# Patient Record
Sex: Male | Born: 1935 | Race: White | Hispanic: No | Smoking: Never smoker
Health system: Southern US, Community
[De-identification: ages and names within clinical notes are randomized; demographics above are authoritative.]

## PROBLEM LIST (undated history)

## (undated) DIAGNOSIS — I4891 Unspecified atrial fibrillation: Secondary | ICD-10-CM

## (undated) DIAGNOSIS — B009 Herpesviral infection, unspecified: Secondary | ICD-10-CM

## (undated) DIAGNOSIS — G894 Chronic pain syndrome: Secondary | ICD-10-CM

## (undated) DIAGNOSIS — K2100 Gastro-esophageal reflux disease with esophagitis, without bleeding: Secondary | ICD-10-CM

## (undated) DIAGNOSIS — M25569 Pain in unspecified knee: Secondary | ICD-10-CM

## (undated) DIAGNOSIS — N189 Chronic kidney disease, unspecified: Secondary | ICD-10-CM

## (undated) DIAGNOSIS — D696 Thrombocytopenia, unspecified: Secondary | ICD-10-CM

## (undated) DIAGNOSIS — F411 Generalized anxiety disorder: Secondary | ICD-10-CM

## (undated) DIAGNOSIS — N321 Vesicointestinal fistula: Secondary | ICD-10-CM

## (undated) DIAGNOSIS — M1610 Unilateral primary osteoarthritis, unspecified hip: Secondary | ICD-10-CM

## (undated) DIAGNOSIS — G4700 Insomnia, unspecified: Secondary | ICD-10-CM

## (undated) DIAGNOSIS — E876 Hypokalemia: Secondary | ICD-10-CM

## (undated) DIAGNOSIS — E785 Hyperlipidemia, unspecified: Secondary | ICD-10-CM

## (undated) DIAGNOSIS — I509 Heart failure, unspecified: Secondary | ICD-10-CM

## (undated) DIAGNOSIS — R1310 Dysphagia, unspecified: Secondary | ICD-10-CM

## (undated) DIAGNOSIS — E119 Type 2 diabetes mellitus without complications: Secondary | ICD-10-CM

## (undated) DIAGNOSIS — I10 Essential (primary) hypertension: Secondary | ICD-10-CM

## (undated) DIAGNOSIS — F329 Major depressive disorder, single episode, unspecified: Secondary | ICD-10-CM

## (undated) DIAGNOSIS — M4800 Spinal stenosis, site unspecified: Secondary | ICD-10-CM

## (undated) DIAGNOSIS — F3289 Other specified depressive episodes: Secondary | ICD-10-CM

## (undated) DIAGNOSIS — M059 Rheumatoid arthritis with rheumatoid factor, unspecified: Secondary | ICD-10-CM

## (undated) DIAGNOSIS — G609 Hereditary and idiopathic neuropathy, unspecified: Secondary | ICD-10-CM

## (undated) DIAGNOSIS — M1990 Unspecified osteoarthritis, unspecified site: Secondary | ICD-10-CM

## (undated) DIAGNOSIS — C61 Malignant neoplasm of prostate: Secondary | ICD-10-CM

## (undated) DIAGNOSIS — IMO0001 Reserved for inherently not codable concepts without codable children: Secondary | ICD-10-CM

## (undated) DIAGNOSIS — K5900 Constipation, unspecified: Secondary | ICD-10-CM

## (undated) HISTORY — DX: Unilateral primary osteoarthritis, unspecified hip: M16.10

## (undated) HISTORY — DX: Type 2 diabetes mellitus without complications: E11.9

## (undated) HISTORY — DX: Gastro-esophageal reflux disease with esophagitis, without bleeding: K21.00

## (undated) HISTORY — DX: Essential (primary) hypertension: I10

## (undated) HISTORY — DX: Generalized anxiety disorder: F41.1

## (undated) HISTORY — DX: Vesicointestinal fistula: N32.1

## (undated) HISTORY — DX: Insomnia, unspecified: G47.00

## (undated) HISTORY — DX: Reserved for inherently not codable concepts without codable children: IMO0001

## (undated) HISTORY — DX: Unspecified atrial fibrillation: I48.91

## (undated) HISTORY — DX: Major depressive disorder, single episode, unspecified: F32.9

## (undated) HISTORY — DX: Unspecified osteoarthritis, unspecified site: M19.90

## (undated) HISTORY — DX: Constipation, unspecified: K59.00

## (undated) HISTORY — DX: Chronic pain syndrome: G89.4

## (undated) HISTORY — DX: Hyperlipidemia, unspecified: E78.5

## (undated) HISTORY — DX: Spinal stenosis, site unspecified: M48.00

## (undated) HISTORY — DX: Heart failure, unspecified: I50.9

## (undated) HISTORY — DX: Pain in unspecified knee: M25.569

## (undated) HISTORY — DX: Other specified depressive episodes: F32.89

## (undated) HISTORY — DX: Rheumatoid arthritis with rheumatoid factor, unspecified: M05.9

## (undated) HISTORY — DX: Hypokalemia: E87.6

## (undated) HISTORY — DX: Thrombocytopenia, unspecified: D69.6

## (undated) HISTORY — DX: Dysphagia, unspecified: R13.10

## (undated) HISTORY — DX: Hereditary and idiopathic neuropathy, unspecified: G60.9

## (undated) HISTORY — DX: Gastro-esophageal reflux disease with esophagitis: K21.0

## (undated) HISTORY — DX: Herpesviral infection, unspecified: B00.9

## (undated) HISTORY — DX: Chronic kidney disease, unspecified: N18.9

## (undated) HISTORY — DX: Malignant neoplasm of prostate: C61

---

## 1989-07-23 HISTORY — PX: PROSTATE SURGERY: SHX751

## 1990-07-23 HISTORY — PX: NASAL SINUS SURGERY: SHX719

## 2002-02-23 ENCOUNTER — Encounter: Payer: Self-pay | Admitting: Neurological Surgery

## 2002-02-23 ENCOUNTER — Encounter: Admission: RE | Admit: 2002-02-23 | Discharge: 2002-02-23 | Payer: Self-pay | Admitting: Neurological Surgery

## 2003-12-22 ENCOUNTER — Ambulatory Visit (HOSPITAL_COMMUNITY): Admission: RE | Admit: 2003-12-22 | Discharge: 2003-12-22 | Payer: Self-pay | Admitting: Cardiovascular Disease

## 2004-01-31 HISTORY — PX: CARDIAC CATHETERIZATION: SHX172

## 2006-01-09 ENCOUNTER — Encounter: Admission: RE | Admit: 2006-01-09 | Discharge: 2006-01-09 | Payer: Self-pay | Admitting: *Deleted

## 2006-03-20 ENCOUNTER — Ambulatory Visit: Payer: Self-pay | Admitting: Internal Medicine

## 2006-03-27 ENCOUNTER — Ambulatory Visit: Payer: Self-pay | Admitting: *Deleted

## 2006-04-12 ENCOUNTER — Ambulatory Visit: Payer: Self-pay | Admitting: Internal Medicine

## 2006-07-23 HISTORY — PX: OTHER SURGICAL HISTORY: SHX169

## 2006-08-09 HISTORY — PX: COLONOSCOPY: SHX174

## 2006-08-09 LAB — HM COLONOSCOPY

## 2009-11-09 ENCOUNTER — Ambulatory Visit: Payer: Self-pay | Admitting: Oncology

## 2009-11-22 LAB — TECHNOLOGIST REVIEW

## 2009-11-22 LAB — COMPREHENSIVE METABOLIC PANEL
ALT: 17 U/L (ref 0–53)
AST: 26 U/L (ref 0–37)
Albumin: 4.4 g/dL (ref 3.5–5.2)
Alkaline Phosphatase: 57 U/L (ref 39–117)
BUN: 19 mg/dL (ref 6–23)
CO2: 23 mEq/L (ref 19–32)
Calcium: 9.1 mg/dL (ref 8.4–10.5)
Chloride: 106 mEq/L (ref 96–112)
Creatinine, Ser: 1.05 mg/dL (ref 0.40–1.50)
Glucose, Bld: 120 mg/dL — ABNORMAL HIGH (ref 70–99)
Potassium: 4.2 mEq/L (ref 3.5–5.3)
Sodium: 141 mEq/L (ref 135–145)
Total Bilirubin: 0.7 mg/dL (ref 0.3–1.2)
Total Protein: 6.8 g/dL (ref 6.0–8.3)

## 2009-11-22 LAB — CBC WITH DIFFERENTIAL/PLATELET
BASO%: 0.2 % (ref 0.0–2.0)
Basophils Absolute: 0 10*3/uL (ref 0.0–0.1)
EOS%: 1 % (ref 0.0–7.0)
Eosinophils Absolute: 0.1 10*3/uL (ref 0.0–0.5)
HCT: 37.3 % — ABNORMAL LOW (ref 38.4–49.9)
HGB: 12.4 g/dL — ABNORMAL LOW (ref 13.0–17.1)
LYMPH%: 25.4 % (ref 14.0–49.0)
MCH: 30.5 pg (ref 27.2–33.4)
MCHC: 33.2 g/dL (ref 32.0–36.0)
MCV: 91.9 fL (ref 79.3–98.0)
MONO#: 0.3 10*3/uL (ref 0.1–0.9)
MONO%: 5.4 % (ref 0.0–14.0)
NEUT#: 3.9 10*3/uL (ref 1.5–6.5)
NEUT%: 68 % (ref 39.0–75.0)
Platelets: 100 10*3/uL — ABNORMAL LOW (ref 140–400)
RBC: 4.06 10*6/uL — ABNORMAL LOW (ref 4.20–5.82)
RDW: 13.1 % (ref 11.0–14.6)
WBC: 5.8 10*3/uL (ref 4.0–10.3)
lymph#: 1.5 10*3/uL (ref 0.9–3.3)

## 2009-11-22 LAB — CHCC SMEAR

## 2009-11-25 ENCOUNTER — Ambulatory Visit (HOSPITAL_COMMUNITY): Admission: RE | Admit: 2009-11-25 | Discharge: 2009-11-25 | Payer: Self-pay | Admitting: Oncology

## 2009-12-23 ENCOUNTER — Ambulatory Visit: Payer: Self-pay | Admitting: Oncology

## 2009-12-27 LAB — CBC WITH DIFFERENTIAL/PLATELET
BASO%: 0.2 % (ref 0.0–2.0)
Basophils Absolute: 0 10*3/uL (ref 0.0–0.1)
EOS%: 2.2 % (ref 0.0–7.0)
Eosinophils Absolute: 0.1 10*3/uL (ref 0.0–0.5)
HCT: 37.4 % — ABNORMAL LOW (ref 38.4–49.9)
HGB: 12.7 g/dL — ABNORMAL LOW (ref 13.0–17.1)
LYMPH%: 31 % (ref 14.0–49.0)
MCH: 31.3 pg (ref 27.2–33.4)
MCHC: 34.1 g/dL (ref 32.0–36.0)
MCV: 92 fL (ref 79.3–98.0)
MONO#: 0.4 10*3/uL (ref 0.1–0.9)
MONO%: 7.5 % (ref 0.0–14.0)
NEUT#: 3.2 10*3/uL (ref 1.5–6.5)
NEUT%: 59.1 % (ref 39.0–75.0)
Platelets: 72 10*3/uL — ABNORMAL LOW (ref 140–400)
RBC: 4.06 10*6/uL — ABNORMAL LOW (ref 4.20–5.82)
RDW: 12.8 % (ref 11.0–14.6)
WBC: 5.5 10*3/uL (ref 4.0–10.3)
lymph#: 1.7 10*3/uL (ref 0.9–3.3)

## 2010-01-18 ENCOUNTER — Ambulatory Visit (HOSPITAL_COMMUNITY): Admission: RE | Admit: 2010-01-18 | Discharge: 2010-01-18 | Payer: Self-pay | Admitting: Oncology

## 2010-01-18 LAB — CBC WITH DIFFERENTIAL/PLATELET
BASO%: 0.2 % (ref 0.0–2.0)
Basophils Absolute: 0 10*3/uL (ref 0.0–0.1)
EOS%: 0.1 % (ref 0.0–7.0)
Eosinophils Absolute: 0 10*3/uL (ref 0.0–0.5)
HCT: 38.5 % (ref 38.4–49.9)
HGB: 12.9 g/dL — ABNORMAL LOW (ref 13.0–17.1)
LYMPH%: 7.1 % — ABNORMAL LOW (ref 14.0–49.0)
MCH: 31.2 pg (ref 27.2–33.4)
MCHC: 33.4 g/dL (ref 32.0–36.0)
MCV: 93.5 fL (ref 79.3–98.0)
MONO#: 0.2 10*3/uL (ref 0.1–0.9)
MONO%: 1.7 % (ref 0.0–14.0)
NEUT#: 8.1 10*3/uL — ABNORMAL HIGH (ref 1.5–6.5)
NEUT%: 90.9 % — ABNORMAL HIGH (ref 39.0–75.0)
Platelets: 53 10*3/uL — ABNORMAL LOW (ref 140–400)
RBC: 4.12 10*6/uL — ABNORMAL LOW (ref 4.20–5.82)
RDW: 14.6 % (ref 11.0–14.6)
WBC: 8.9 10*3/uL (ref 4.0–10.3)
lymph#: 0.6 10*3/uL — ABNORMAL LOW (ref 0.9–3.3)

## 2010-01-18 LAB — COMPREHENSIVE METABOLIC PANEL
ALT: 24 U/L (ref 0–53)
AST: 24 U/L (ref 0–37)
Albumin: 3.4 g/dL — ABNORMAL LOW (ref 3.5–5.2)
Alkaline Phosphatase: 61 U/L (ref 39–117)
BUN: 30 mg/dL — ABNORMAL HIGH (ref 6–23)
CO2: 26 mEq/L (ref 19–32)
Calcium: 8.1 mg/dL — ABNORMAL LOW (ref 8.4–10.5)
Chloride: 104 mEq/L (ref 96–112)
Creatinine, Ser: 1.43 mg/dL (ref 0.40–1.50)
Glucose, Bld: 167 mg/dL — ABNORMAL HIGH (ref 70–99)
Potassium: 4.5 mEq/L (ref 3.5–5.3)
Sodium: 138 mEq/L (ref 135–145)
Total Bilirubin: 0.6 mg/dL (ref 0.3–1.2)
Total Protein: 5.7 g/dL — ABNORMAL LOW (ref 6.0–8.3)

## 2010-01-30 ENCOUNTER — Ambulatory Visit: Payer: Self-pay | Admitting: Oncology

## 2010-01-30 ENCOUNTER — Ambulatory Visit (HOSPITAL_COMMUNITY): Admission: RE | Admit: 2010-01-30 | Discharge: 2010-01-30 | Payer: Self-pay | Admitting: Oncology

## 2010-01-31 ENCOUNTER — Ambulatory Visit: Payer: Self-pay | Admitting: Oncology

## 2010-02-08 ENCOUNTER — Ambulatory Visit (HOSPITAL_COMMUNITY): Admission: RE | Admit: 2010-02-08 | Discharge: 2010-02-08 | Payer: Self-pay | Admitting: Oncology

## 2010-04-14 ENCOUNTER — Ambulatory Visit: Payer: Self-pay | Admitting: Oncology

## 2010-04-18 LAB — CBC WITH DIFFERENTIAL/PLATELET
BASO%: 0.5 % (ref 0.0–2.0)
Basophils Absolute: 0 10*3/uL (ref 0.0–0.1)
EOS%: 2 % (ref 0.0–7.0)
Eosinophils Absolute: 0.1 10*3/uL (ref 0.0–0.5)
HCT: 38.2 % — ABNORMAL LOW (ref 38.4–49.9)
HGB: 13.3 g/dL (ref 13.0–17.1)
LYMPH%: 32.6 % (ref 14.0–49.0)
MCH: 31.7 pg (ref 27.2–33.4)
MCHC: 34.7 g/dL (ref 32.0–36.0)
MCV: 91.4 fL (ref 79.3–98.0)
MONO#: 0.6 10*3/uL (ref 0.1–0.9)
MONO%: 8.3 % (ref 0.0–14.0)
NEUT#: 4.1 10*3/uL (ref 1.5–6.5)
NEUT%: 56.6 % (ref 39.0–75.0)
Platelets: 126 10*3/uL — ABNORMAL LOW (ref 140–400)
RBC: 4.18 10*6/uL — ABNORMAL LOW (ref 4.20–5.82)
RDW: 13.1 % (ref 11.0–14.6)
WBC: 7.3 10*3/uL (ref 4.0–10.3)
lymph#: 2.4 10*3/uL (ref 0.9–3.3)

## 2010-04-18 LAB — COMPREHENSIVE METABOLIC PANEL
ALT: 11 U/L (ref 0–53)
AST: 18 U/L (ref 0–37)
Albumin: 4.3 g/dL (ref 3.5–5.2)
Alkaline Phosphatase: 67 U/L (ref 39–117)
BUN: 17 mg/dL (ref 6–23)
CO2: 22 mEq/L (ref 19–32)
Calcium: 8.9 mg/dL (ref 8.4–10.5)
Chloride: 105 mEq/L (ref 96–112)
Creatinine, Ser: 1.19 mg/dL (ref 0.40–1.50)
Glucose, Bld: 102 mg/dL — ABNORMAL HIGH (ref 70–99)
Potassium: 4.6 mEq/L (ref 3.5–5.3)
Sodium: 139 mEq/L (ref 135–145)
Total Bilirubin: 0.7 mg/dL (ref 0.3–1.2)
Total Protein: 6.4 g/dL (ref 6.0–8.3)

## 2010-07-18 ENCOUNTER — Ambulatory Visit: Payer: Self-pay | Admitting: Oncology

## 2010-10-08 LAB — DIFFERENTIAL
Basophils Absolute: 0 10*3/uL (ref 0.0–0.1)
Basophils Relative: 0 % (ref 0–1)
Eosinophils Absolute: 0 10*3/uL (ref 0.0–0.7)
Eosinophils Relative: 0 % (ref 0–5)
Lymphocytes Relative: 12 % (ref 12–46)
Lymphs Abs: 0.9 10*3/uL (ref 0.7–4.0)
Monocytes Absolute: 0.2 10*3/uL (ref 0.1–1.0)
Monocytes Relative: 3 % (ref 3–12)
Neutro Abs: 6.3 10*3/uL (ref 1.7–7.7)
Neutrophils Relative %: 85 % — ABNORMAL HIGH (ref 43–77)

## 2010-10-08 LAB — CBC
HCT: 34.4 % — ABNORMAL LOW (ref 39.0–52.0)
Hemoglobin: 11.6 g/dL — ABNORMAL LOW (ref 13.0–17.0)
MCH: 31.7 pg (ref 26.0–34.0)
MCHC: 33.8 g/dL (ref 30.0–36.0)
MCV: 93.6 fL (ref 78.0–100.0)
Platelets: 65 10*3/uL — ABNORMAL LOW (ref 150–400)
RBC: 3.68 MIL/uL — ABNORMAL LOW (ref 4.22–5.81)
RDW: 14.8 % (ref 11.5–15.5)
WBC: 7.4 10*3/uL (ref 4.0–10.5)

## 2010-10-08 LAB — CHROMOSOME ANALYSIS, BONE MARROW

## 2010-10-08 LAB — BONE MARROW EXAM

## 2010-12-08 NOTE — Assessment & Plan Note (Signed)
HEALTHCARE                               PULMONARY OFFICE NOTE   HARVEST, STANCO                       MRN:          [id number]  DATE:04/12/2006                            DOB:          November 13, 1935    Pulmonary/allergy followup.   PROBLEM:  1. Perennial allergic rhinitis.  2. Tinnitus with hearing loss/chronic acoustic injury.  3. Musculoskeletal chest pain.  4. Dyspnea with exertion.  5. History of asbestos exposure.  6. Sleep-disordered breathing with loud snoring, witnessed apneas and      probable obstructive sleep apnea.   HISTORY:  He comes today for skin testing as planned, off of antihistamines,  and again complaining that his head is packed up.  His hearing quality is  poor.  He complains of thick postnasal drainage, which increases while  eating.  Sample Nasonex helps, and he asks for another to extend the trial a  little longer.  He is not having chest pain, wheeze or cough.  He continues  to chew tobacco.   MEDICATIONS:  1. Alprazolam at bedtime.  2. Trazodone at bedtime.  3. Hydrochlorothiazide 25 mg.  4. Aleve.  5. MS Contin.  6. Aspirin 81 mg.   ALLERGIES:  No medication allergy.   OBJECTIVE:  Weight 248 pounds, BP 112/70, pulse regular 74, room air  saturation 95% at rest.  He is again noted to be hard of hearing, but otherwise not in evident  discomfort.  His nasal airway is unremarkable, pharynx significant for long palate  without stridor.  LUNGS:  Quiet.  Heart sounds are regular without murmur.   A CT scan of his sinuses on March 27, 2006, showed no acute  abnormalities.  There was a very small mucosal retention cyst in each  maxillary sinus, mild nasal septal deviation, no air fluid levels.   SKIN TESTS:  Positive histamine, negative diluent controls.  Modest positive  intradermal reactions for grass pollens, some fall weeds and tree pollens,  house dust and dog.  They do have a dog.   IMPRESSION:  1. Most of his hearing loss is probably from acoustic injury.  2. Allergic rhinitis without CT evidence of sinusitis.  I am not convinced      this is important enough to launch allergy vaccine.  3. Original complaint of dyspnea at rest with productive cough, for which      we are going to get a pulmonary function test.  4. Suggestive body habitus with reports of loud snoring, suggestive of      sleep apnea.   PLAN:  1. Pulmonary function test.  2. We are scheduling a nocturnal polysomnogram at the [facility].  3. Sinus saline lavage on a regular basis.  4. Environmental precautions with print information given and discussed.  5. Repeat sample Nasonex once each nostril daily.  6. He will return for followup after his sleep study is completed.  Clinton D. Maple Hudson, MD, FCCP, FACP   CDY/MedQ  DD:  04/12/2006  DT:  04/16/2006  Job #:  [id number]   cc:   Lenon Curt Chilton Si, M.D.  [facility]

## 2011-08-06 DIAGNOSIS — E119 Type 2 diabetes mellitus without complications: Secondary | ICD-10-CM | POA: Diagnosis not present

## 2011-08-06 DIAGNOSIS — E785 Hyperlipidemia, unspecified: Secondary | ICD-10-CM | POA: Diagnosis not present

## 2011-08-09 DIAGNOSIS — M25569 Pain in unspecified knee: Secondary | ICD-10-CM | POA: Diagnosis not present

## 2011-08-09 DIAGNOSIS — F329 Major depressive disorder, single episode, unspecified: Secondary | ICD-10-CM | POA: Diagnosis not present

## 2011-08-09 DIAGNOSIS — G4700 Insomnia, unspecified: Secondary | ICD-10-CM | POA: Diagnosis not present

## 2011-08-09 DIAGNOSIS — M1610 Unilateral primary osteoarthritis, unspecified hip: Secondary | ICD-10-CM | POA: Diagnosis not present

## 2011-08-09 DIAGNOSIS — F3289 Other specified depressive episodes: Secondary | ICD-10-CM | POA: Diagnosis not present

## 2011-08-23 DIAGNOSIS — E119 Type 2 diabetes mellitus without complications: Secondary | ICD-10-CM | POA: Diagnosis not present

## 2011-08-23 DIAGNOSIS — H2510 Age-related nuclear cataract, unspecified eye: Secondary | ICD-10-CM | POA: Diagnosis not present

## 2011-08-23 DIAGNOSIS — H0230 Blepharochalasis unspecified eye, unspecified eyelid: Secondary | ICD-10-CM | POA: Diagnosis not present

## 2011-08-23 DIAGNOSIS — H04129 Dry eye syndrome of unspecified lacrimal gland: Secondary | ICD-10-CM | POA: Diagnosis not present

## 2011-11-14 DIAGNOSIS — E119 Type 2 diabetes mellitus without complications: Secondary | ICD-10-CM | POA: Diagnosis not present

## 2011-11-14 DIAGNOSIS — I10 Essential (primary) hypertension: Secondary | ICD-10-CM | POA: Diagnosis not present

## 2011-11-16 DIAGNOSIS — M4800 Spinal stenosis, site unspecified: Secondary | ICD-10-CM | POA: Diagnosis not present

## 2011-11-16 DIAGNOSIS — E119 Type 2 diabetes mellitus without complications: Secondary | ICD-10-CM | POA: Diagnosis not present

## 2011-11-16 DIAGNOSIS — E785 Hyperlipidemia, unspecified: Secondary | ICD-10-CM | POA: Diagnosis not present

## 2011-11-16 DIAGNOSIS — G894 Chronic pain syndrome: Secondary | ICD-10-CM | POA: Diagnosis not present

## 2011-12-27 DIAGNOSIS — M1990 Unspecified osteoarthritis, unspecified site: Secondary | ICD-10-CM | POA: Diagnosis not present

## 2011-12-27 DIAGNOSIS — M549 Dorsalgia, unspecified: Secondary | ICD-10-CM | POA: Diagnosis not present

## 2012-09-08 DIAGNOSIS — E119 Type 2 diabetes mellitus without complications: Secondary | ICD-10-CM | POA: Diagnosis not present

## 2012-09-08 DIAGNOSIS — I10 Essential (primary) hypertension: Secondary | ICD-10-CM | POA: Diagnosis not present

## 2012-09-08 DIAGNOSIS — C61 Malignant neoplasm of prostate: Secondary | ICD-10-CM | POA: Diagnosis not present

## 2012-09-08 DIAGNOSIS — E785 Hyperlipidemia, unspecified: Secondary | ICD-10-CM | POA: Diagnosis not present

## 2012-09-10 DIAGNOSIS — E785 Hyperlipidemia, unspecified: Secondary | ICD-10-CM | POA: Diagnosis not present

## 2012-09-10 DIAGNOSIS — M4800 Spinal stenosis, site unspecified: Secondary | ICD-10-CM | POA: Diagnosis not present

## 2012-09-10 DIAGNOSIS — M545 Low back pain, unspecified: Secondary | ICD-10-CM | POA: Diagnosis not present

## 2012-09-10 DIAGNOSIS — E119 Type 2 diabetes mellitus without complications: Secondary | ICD-10-CM | POA: Diagnosis not present

## 2012-09-11 ENCOUNTER — Other Ambulatory Visit: Payer: Self-pay | Admitting: Internal Medicine

## 2012-09-11 DIAGNOSIS — M545 Low back pain, unspecified: Secondary | ICD-10-CM

## 2012-09-16 ENCOUNTER — Ambulatory Visit
Admission: RE | Admit: 2012-09-16 | Discharge: 2012-09-16 | Disposition: A | Discharge status: Home / Self Care | Payer: Medicare Other | Source: Ambulatory Visit | Attending: Internal Medicine | Admitting: Internal Medicine

## 2012-09-16 DIAGNOSIS — M47817 Spondylosis without myelopathy or radiculopathy, lumbosacral region: Secondary | ICD-10-CM | POA: Diagnosis not present

## 2012-09-16 DIAGNOSIS — M4310 Spondylolisthesis, site unspecified: Secondary | ICD-10-CM | POA: Diagnosis not present

## 2012-09-16 DIAGNOSIS — M545 Low back pain, unspecified: Secondary | ICD-10-CM

## 2012-09-16 DIAGNOSIS — M5126 Other intervertebral disc displacement, lumbar region: Secondary | ICD-10-CM | POA: Diagnosis not present

## 2012-10-30 DIAGNOSIS — M25559 Pain in unspecified hip: Secondary | ICD-10-CM | POA: Diagnosis not present

## 2012-11-04 ENCOUNTER — Other Ambulatory Visit: Payer: Self-pay | Admitting: Internal Medicine

## 2012-11-04 ENCOUNTER — Other Ambulatory Visit: Payer: Self-pay | Admitting: Neurological Surgery

## 2012-11-04 DIAGNOSIS — M25551 Pain in right hip: Secondary | ICD-10-CM

## 2012-11-06 ENCOUNTER — Other Ambulatory Visit: Payer: Self-pay | Admitting: Geriatric Medicine

## 2012-11-06 MED ORDER — MORPHINE SULFATE 30 MG PO TABS: ORAL_TABLET | ORAL | Status: DC

## 2012-11-07 ENCOUNTER — Ambulatory Visit
Admission: RE | Admit: 2012-11-07 | Discharge: 2012-11-07 | Disposition: A | Discharge status: Home / Self Care | Payer: Medicare Other | Source: Ambulatory Visit | Attending: Neurological Surgery | Admitting: Neurological Surgery

## 2012-11-07 DIAGNOSIS — M25551 Pain in right hip: Secondary | ICD-10-CM

## 2012-11-07 DIAGNOSIS — M25559 Pain in unspecified hip: Secondary | ICD-10-CM | POA: Diagnosis not present

## 2012-11-07 MED ORDER — METHYLPREDNISOLONE ACETATE 40 MG/ML INJ SUSP (RADIOLOG
120.0000 mg | Freq: Once | INTRAMUSCULAR | Status: AC
  Administered 2012-11-07: 120 mg via INTRA_ARTICULAR

## 2012-11-07 MED ORDER — IOHEXOL 180 MG/ML  SOLN
1.0000 mL | Freq: Once | INTRAMUSCULAR | Status: AC | PRN
  Administered 2012-11-07: 1 mL

## 2012-11-20 DIAGNOSIS — M25559 Pain in unspecified hip: Secondary | ICD-10-CM | POA: Diagnosis not present

## 2012-11-20 DIAGNOSIS — M47812 Spondylosis without myelopathy or radiculopathy, cervical region: Secondary | ICD-10-CM | POA: Diagnosis not present

## 2012-11-21 ENCOUNTER — Other Ambulatory Visit: Payer: Self-pay | Admitting: Neurological Surgery

## 2012-11-21 DIAGNOSIS — M47812 Spondylosis without myelopathy or radiculopathy, cervical region: Secondary | ICD-10-CM

## 2012-11-27 ENCOUNTER — Ambulatory Visit
Admission: RE | Admit: 2012-11-27 | Discharge: 2012-11-27 | Disposition: A | Discharge status: Home / Self Care | Payer: Medicare Other | Source: Ambulatory Visit | Attending: Neurological Surgery | Admitting: Neurological Surgery

## 2012-11-27 DIAGNOSIS — M47812 Spondylosis without myelopathy or radiculopathy, cervical region: Secondary | ICD-10-CM

## 2012-11-27 DIAGNOSIS — M5020 Other cervical disc displacement, unspecified cervical region: Secondary | ICD-10-CM | POA: Diagnosis not present

## 2012-11-27 DIAGNOSIS — M5030 Other cervical disc degeneration, unspecified cervical region: Secondary | ICD-10-CM | POA: Diagnosis not present

## 2012-12-08 ENCOUNTER — Encounter: Payer: Self-pay | Admitting: *Deleted

## 2012-12-08 ENCOUNTER — Other Ambulatory Visit: Payer: Self-pay | Admitting: *Deleted

## 2012-12-08 MED ORDER — MORPHINE SULFATE 30 MG PO TABS: ORAL_TABLET | ORAL | Status: DC

## 2012-12-09 ENCOUNTER — Ambulatory Visit: Payer: Self-pay | Admitting: Internal Medicine

## 2012-12-10 DIAGNOSIS — M47812 Spondylosis without myelopathy or radiculopathy, cervical region: Secondary | ICD-10-CM | POA: Diagnosis not present

## 2012-12-19 ENCOUNTER — Other Ambulatory Visit: Payer: Self-pay | Admitting: Geriatric Medicine

## 2012-12-19 MED ORDER — SULFAMETHOXAZOLE-TMP DS 800-160 MG PO TABS: 1.0000 | ORAL_TABLET | Freq: Two times a day (BID) | ORAL | Status: DC

## 2013-01-08 ENCOUNTER — Other Ambulatory Visit: Payer: Self-pay | Admitting: Geriatric Medicine

## 2013-01-08 MED ORDER — MORPHINE SULFATE 30 MG PO TABS: ORAL_TABLET | ORAL | Status: DC

## 2013-02-06 ENCOUNTER — Other Ambulatory Visit: Payer: Self-pay | Admitting: Geriatric Medicine

## 2013-02-09 ENCOUNTER — Other Ambulatory Visit: Payer: Self-pay | Admitting: Geriatric Medicine

## 2013-02-09 MED ORDER — MORPHINE SULFATE 30 MG PO TABS: ORAL_TABLET | ORAL | Status: DC

## 2013-03-07 ENCOUNTER — Other Ambulatory Visit: Payer: Self-pay | Admitting: Internal Medicine

## 2013-03-09 ENCOUNTER — Other Ambulatory Visit: Payer: Self-pay | Admitting: Geriatric Medicine

## 2013-03-09 MED ORDER — ZOLPIDEM TARTRATE 5 MG PO TABS: ORAL_TABLET | ORAL | Status: DC

## 2013-03-12 ENCOUNTER — Other Ambulatory Visit: Payer: Self-pay | Admitting: Geriatric Medicine

## 2013-03-12 MED ORDER — MORPHINE SULFATE 30 MG PO TABS: ORAL_TABLET | ORAL | Status: DC

## 2013-04-13 ENCOUNTER — Other Ambulatory Visit: Payer: Self-pay | Admitting: *Deleted

## 2013-04-13 MED ORDER — MORPHINE SULFATE 30 MG PO TABS: ORAL_TABLET | ORAL | Status: DC

## 2013-05-12 ENCOUNTER — Other Ambulatory Visit: Payer: Self-pay | Admitting: *Deleted

## 2013-05-12 MED ORDER — MORPHINE SULFATE 30 MG PO TABS: ORAL_TABLET | ORAL | Status: DC

## 2013-06-10 ENCOUNTER — Other Ambulatory Visit: Payer: Self-pay | Admitting: *Deleted

## 2013-06-10 MED ORDER — MORPHINE SULFATE 30 MG PO TABS: ORAL_TABLET | ORAL | Status: DC

## 2013-07-09 ENCOUNTER — Other Ambulatory Visit: Payer: Self-pay | Admitting: *Deleted

## 2013-07-09 MED ORDER — MORPHINE SULFATE 30 MG PO TABS: ORAL_TABLET | ORAL | Status: DC

## 2013-07-29 ENCOUNTER — Telehealth: Payer: Self-pay | Admitting: *Deleted

## 2013-07-29 ENCOUNTER — Other Ambulatory Visit: Payer: Self-pay | Admitting: *Deleted

## 2013-07-29 NOTE — Telephone Encounter (Signed)
Spoke to pt's son, Ronnie Jackson, he is having trouble sleeping and is inquiring about getting an RX for Trazodone to help.   Please advise.  thanks

## 2013-07-30 ENCOUNTER — Ambulatory Visit: Payer: Self-pay | Admitting: Nurse Practitioner

## 2013-07-30 ENCOUNTER — Ambulatory Visit (INDEPENDENT_AMBULATORY_CARE_PROVIDER_SITE_OTHER): Payer: Medicare Other | Admitting: Nurse Practitioner

## 2013-07-30 ENCOUNTER — Encounter: Payer: Self-pay | Admitting: Nurse Practitioner

## 2013-07-30 VITALS — BP 150/88 | HR 93 | Temp 98.6°F | Resp 12 | Wt 224.0 lb

## 2013-07-30 DIAGNOSIS — G4700 Insomnia, unspecified: Secondary | ICD-10-CM | POA: Diagnosis not present

## 2013-07-30 DIAGNOSIS — G609 Hereditary and idiopathic neuropathy, unspecified: Secondary | ICD-10-CM

## 2013-07-30 DIAGNOSIS — I10 Essential (primary) hypertension: Secondary | ICD-10-CM | POA: Diagnosis not present

## 2013-07-30 DIAGNOSIS — E785 Hyperlipidemia, unspecified: Secondary | ICD-10-CM | POA: Insufficient documentation

## 2013-07-30 DIAGNOSIS — E1142 Type 2 diabetes mellitus with diabetic polyneuropathy: Secondary | ICD-10-CM | POA: Insufficient documentation

## 2013-07-30 DIAGNOSIS — E119 Type 2 diabetes mellitus without complications: Secondary | ICD-10-CM

## 2013-07-30 DIAGNOSIS — G894 Chronic pain syndrome: Secondary | ICD-10-CM | POA: Diagnosis not present

## 2013-07-30 MED ORDER — TRAZODONE HCL 50 MG PO TABS: 25.0000 mg | ORAL_TABLET | Freq: Every evening | ORAL | Status: DC | PRN

## 2013-07-30 MED ORDER — LISINOPRIL-HYDROCHLOROTHIAZIDE 20-12.5 MG PO TABS: ORAL_TABLET | ORAL | Status: DC

## 2013-07-30 NOTE — Patient Instructions (Addendum)
Will get blood work today but you need to come back before physical for fasting blood work   Will start trazodone at night for sleep- will re-evaluate this at next visit  Routine at night Insomnia Insomnia is frequent trouble falling and/or staying asleep. Insomnia can be a long term problem or a short term problem. Both are common. Insomnia can be a short term problem when the wakefulness is related to a certain stress or worry. Long term insomnia is often related to ongoing stress during waking hours and/or poor sleeping habits. Overtime, sleep deprivation itself can make the problem worse. Every little thing feels more severe because you are overtired and your ability to cope is decreased. CAUSES   Stress, anxiety, and depression.  Poor sleeping habits.  Distractions such as TV in the bedroom.  Naps close to bedtime.  Engaging in emotionally charged conversations before bed.  Technical reading before sleep.  Alcohol and other sedatives. They may make the problem worse. They can hurt normal sleep patterns and normal dream activity.  Stimulants such as caffeine for several hours prior to bedtime.  Pain syndromes and shortness of breath can cause insomnia.  Exercise late at night.  Changing time zones may cause sleeping problems (jet lag). It is sometimes helpful to have someone observe your sleeping patterns. They should look for periods of not breathing during the night (sleep apnea). They should also look to see how long those periods last. If you live alone or observers are uncertain, you can also be observed at a sleep clinic where your sleep patterns will be professionally monitored. Sleep apnea requires a checkup and treatment. Give your caregivers your medical history. Give your caregivers observations your family has made about your sleep.  SYMPTOMS   Not feeling rested in the morning.  Anxiety and restlessness at bedtime.  Difficulty falling and staying  asleep. TREATMENT   Your caregiver may prescribe treatment for an underlying medical disorders. Your caregiver can give advice or help if you are using alcohol or other drugs for self-medication. Treatment of underlying problems will usually eliminate insomnia problems.  Medications can be prescribed for short time use. They are generally not recommended for lengthy use.  Over-the-counter sleep medicines are not recommended for lengthy use. They can be habit forming.  You can promote easier sleeping by making lifestyle changes such as:  Using relaxation techniques that help with breathing and reduce muscle tension.  Exercising earlier in the day.  Changing your diet and the time of your last meal. No night time snacks.  Establish a regular time to go to bed.  Counseling can help with stressful problems and worry.  Soothing music and white noise may be helpful if there are background noises you cannot remove.  Stop tedious detailed work at least one hour before bedtime. HOME CARE INSTRUCTIONS   Keep a diary. Inform your caregiver about your progress. This includes any medication side effects. See your caregiver regularly. Take note of:  Times when you are asleep.  Times when you are awake during the night.  The quality of your sleep.  How you feel the next day. This information will help your caregiver care for you.  Get out of bed if you are still awake after 15 minutes. Read or do some quiet activity. Keep the lights down. Wait until you feel sleepy and go back to bed.  Keep regular sleeping and waking hours. Avoid naps.  Exercise regularly.  Avoid distractions at bedtime. Distractions include watching  television or engaging in any intense or detailed activity like attempting to balance the household checkbook.  Develop a bedtime ritual. Keep a familiar routine of bathing, brushing your teeth, climbing into bed at the same time each night, listening to soothing music.  Routines increase the success of falling to sleep faster.  Use relaxation techniques. This can be using breathing and muscle tension release routines. It can also include visualizing peaceful scenes. You can also help control troubling or intruding thoughts by keeping your mind occupied with boring or repetitive thoughts like the old concept of counting sheep. You can make it more creative like imagining planting one beautiful flower after another in your backyard garden.  During your day, work to eliminate stress. When this is not possible use some of the previous suggestions to help reduce the anxiety that accompanies stressful situations. MAKE SURE YOU:   Understand these instructions.  Will watch your condition.  Will get help right away if you are not doing well or get worse. Document Released: 07/06/2000 Document Revised: 10/01/2011 Document Reviewed: 08/06/2007 [facility] Patient Information 2014 [place].

## 2013-07-30 NOTE — Progress Notes (Signed)
Patient ID: Ronnie LAWRY, male   DOB: October 19, 1935, 78 y.o.   MRN: [id number]    Allergies  Allergen Reactions  . Indocin [Indomethacin]   . Methadone     Chief Complaint  Patient presents with  . Medication Management    Request rx for Trazadone and Alprazolam. Discuss increasing Morphine to 60 mg vs 30 mg   . Medication Refill    Renew B/P medication- 90 day supply (PRINT OFF)      HPI: Patient is a 78 y.o. male seen in the office today for medication for sleep; has been taking Ambien which helped but now he is used to it; he sits up all night and is unable to sleep; napping during the day.  Has back pain so this dictates what he does during the day and sometimes he is not able to do what he wants to do.  Uses melatonin but this does not help  Trazodone helped in the past but when he got used it   Review of Systems:  Review of Systems  Constitutional: Negative.  Negative for malaise/fatigue.  Respiratory: Negative for cough and shortness of breath.   Cardiovascular: Negative for chest pain and leg swelling.  Gastrointestinal: Negative for heartburn, abdominal pain, diarrhea and constipation.  Genitourinary: Negative for dysuria.  Musculoskeletal: Positive for back pain.  Skin: Negative.   Neurological: Positive for tingling. Negative for dizziness and weakness.  Psychiatric/Behavioral: Negative for depression. The patient has insomnia. The patient is not nervous/anxious.      Past Medical History  Diagnosis Date  . Unspecified arthropathy, pelvic region and thigh   . Herpes simplex disease   . Pain in joint, lower leg   . Intestinovesical fistula   . Thrombocytopenia, unspecified   . Anxiety state, unspecified   . Depressive disorder, not elsewhere classified   . Chronic pain syndrome   . Hypopotassemia   . Insomnia, unspecified   . Dysphagia, unspecified(787.20)   . Spinal stenosis, unspecified region other than cervical   . Type II or unspecified type diabetes  mellitus without mention of complication, not stated as uncontrolled   . Reflux esophagitis   . Other and unspecified hyperlipidemia   . Unspecified constipation   . Unspecified hereditary and idiopathic peripheral neuropathy   . Unspecified essential hypertension   . Myalgia and myositis, unspecified   . Malignant neoplasm of prostate   . Dysuria    Past Surgical History  Procedure Laterality Date  . Prostate surgery  1991  . Nasal sinus surgery  1992  . Cardiac catheterization  2005   Social History:   reports that he has been smoking.  He does not have any smokeless tobacco history on file. He reports that he does not drink alcohol or use illicit drugs.  History reviewed. No pertinent family history.  Medications: Patient's Medications  New Prescriptions   No medications on file  Previous Medications   LISINOPRIL-HYDROCHLOROTHIAZIDE (PRINZIDE,ZESTORETIC) 20-12.5 MG PER TABLET    Take one tablet once daily to control blood pressure   MELATONIN 3 MG TABS    Take one to two tablets at bedtime to help rest   MORPHINE (MSIR) 30 MG TABLET    Take one tablet twice daily as needed for pain.  Modified Medications   No medications on file  Discontinued Medications   ZOLPIDEM (AMBIEN) 5 MG TABLET    Take one to two tablets as needed at bedtime.     Physical Exam:  Filed Vitals:  07/30/13 1449  BP: 150/88  Pulse: 93  Temp: 98.6 F (37 C)  TempSrc: Oral  Resp: 12  Weight: 224 lb (101.606 kg)  SpO2: 97%    Physical Exam  Constitutional: He is well-developed, well-nourished, and in no distress.  HENT:  Head: Normocephalic and atraumatic.  Mouth/Throat: Oropharynx is clear and moist. No oropharyngeal exudate.  Eyes: Conjunctivae and EOM are normal. Pupils are equal, round, and reactive to light.  Neck: Normal range of motion. Neck supple.  Cardiovascular: Normal rate, regular rhythm and normal heart sounds.   Pulmonary/Chest: Effort normal and breath sounds normal. No  respiratory distress.  Abdominal: Soft. Bowel sounds are normal. He exhibits no distension.  Musculoskeletal: He exhibits no edema and no tenderness.  Neurological: He is alert. Gait normal.  Skin: Skin is warm and dry.      Assessment/Plan 1. Insomnia, unspecified -encouraged routine, educated on sleep pattern and not to take naps during the day -increase activity during the day -routine before bedtime - traZODone (DESYREL) 50 MG tablet; Take 0.5-1 tablets (25-50 mg total) by mouth at bedtime as needed for sleep.  Dispense: 30 tablet; Refill: 1  2. Chronic pain syndrome -conts on MSIR 30 mg BID  3. Other and unspecified hyperlipidemia - Lipid panel; before next appt; did not fast today   4. Unspecified hereditary and idiopathic peripheral neuropathy -reports worsening numbness in legs; questions if diabetes worse - CBC With differential/Platelet; Future and A1c today   5. Type II or unspecified type diabetes mellitus without mention of complication, not stated as uncontrolled -diet controlled -cont lifestyle modfications - Hemoglobin A1c  6. Essential hypertension, benign Encouraged lifestyle modifications with diet and exercise, medication compliance -blood pressure high today will cont to follow goal is sbp less than 140 - Comprehensive metabolic panel - lisinopril-hydrochlorothiazide (PRINZIDE,ZESTORETIC) 20-12.5 MG per tablet; Take one tablet once daily to control blood pressure  Dispense: 30 tablet; Refill: 1   To follow up with Dr Nyoka Cowden for EV and evaluation on medication in 4-6 weeks

## 2013-07-31 LAB — COMPREHENSIVE METABOLIC PANEL
ALT: 17 IU/L (ref 0–44)
AST: 23 IU/L (ref 0–40)
Albumin/Globulin Ratio: 1.9 (ref 1.1–2.5)
Albumin: 4.2 g/dL (ref 3.5–4.8)
Alkaline Phosphatase: 67 IU/L (ref 39–117)
BUN/Creatinine Ratio: 10 (ref 10–22)
BUN: 12 mg/dL (ref 8–27)
CO2: 24 mmol/L (ref 18–29)
Calcium: 8.7 mg/dL (ref 8.6–10.2)
Chloride: 100 mmol/L (ref 97–108)
Creatinine, Ser: 1.15 mg/dL (ref 0.76–1.27)
GFR calc Af Amer: 72 mL/min/{1.73_m2} (ref >59)
GFR calc non Af Amer: 62 mL/min/{1.73_m2} (ref >59)
Globulin, Total: 2.2 g/dL (ref 1.5–4.5)
Glucose: 114 mg/dL — ABNORMAL HIGH (ref 65–99)
Potassium: 4.1 mmol/L (ref 3.5–5.2)
Sodium: 139 mmol/L (ref 134–144)
Total Bilirubin: 0.3 mg/dL (ref 0.0–1.2)
Total Protein: 6.4 g/dL (ref 6.0–8.5)

## 2013-07-31 LAB — HEMOGLOBIN A1C
Est. average glucose Bld gHb Est-mCnc: 143 mg/dL
Hgb A1c MFr Bld: 6.6 % — ABNORMAL HIGH (ref 4.8–5.6)

## 2013-08-06 ENCOUNTER — Other Ambulatory Visit: Payer: Self-pay | Admitting: *Deleted

## 2013-08-06 MED ORDER — MORPHINE SULFATE 30 MG PO TABS: ORAL_TABLET | ORAL | Status: DC

## 2013-09-04 ENCOUNTER — Encounter: Payer: Self-pay | Admitting: *Deleted

## 2013-09-09 ENCOUNTER — Other Ambulatory Visit: Payer: Self-pay | Admitting: *Deleted

## 2013-09-09 ENCOUNTER — Other Ambulatory Visit: Payer: No Typology Code available for payment source

## 2013-09-09 DIAGNOSIS — E785 Hyperlipidemia, unspecified: Secondary | ICD-10-CM

## 2013-09-09 DIAGNOSIS — G4700 Insomnia, unspecified: Secondary | ICD-10-CM

## 2013-09-09 DIAGNOSIS — G609 Hereditary and idiopathic neuropathy, unspecified: Secondary | ICD-10-CM

## 2013-09-09 MED ORDER — MORPHINE SULFATE 30 MG PO TABS: ORAL_TABLET | ORAL | Status: DC

## 2013-09-09 MED ORDER — TRAZODONE HCL 50 MG PO TABS: 25.0000 mg | ORAL_TABLET | Freq: Every evening | ORAL | Status: DC | PRN

## 2013-09-10 LAB — CBC WITH DIFFERENTIAL
Basophils Absolute: 0 10*3/uL (ref 0.0–0.2)
Basos: 0 %
Eos: 2 %
Eosinophils Absolute: 0.1 10*3/uL (ref 0.0–0.4)
HCT: 38.7 % (ref 37.5–51.0)
Hemoglobin: 13 g/dL (ref 12.6–17.7)
Immature Grans (Abs): 0 10*3/uL (ref 0.0–0.1)
Immature Granulocytes: 0 %
Lymphocytes Absolute: 2.2 10*3/uL (ref 0.7–3.1)
Lymphs: 35 %
MCH: 30.6 pg (ref 26.6–33.0)
MCHC: 33.6 g/dL (ref 31.5–35.7)
MCV: 91 fL (ref 79–97)
Monocytes Absolute: 0.5 10*3/uL (ref 0.1–0.9)
Monocytes: 8 %
Neutrophils Absolute: 3.5 10*3/uL (ref 1.4–7.0)
Neutrophils Relative %: 55 %
Platelets: 115 10*3/uL — ABNORMAL LOW (ref 150–379)
RBC: 4.25 x10E6/uL (ref 4.14–5.80)
RDW: 14.4 % (ref 12.3–15.4)
WBC: 6.3 10*3/uL (ref 3.4–10.8)

## 2013-09-10 LAB — LIPID PANEL
Chol/HDL Ratio: 4.9 ratio units (ref 0.0–5.0)
Cholesterol, Total: 168 mg/dL (ref 100–199)
HDL: 34 mg/dL — ABNORMAL LOW (ref >39)
LDL Calculated: 100 mg/dL — ABNORMAL HIGH (ref 0–99)
Triglycerides: 168 mg/dL — ABNORMAL HIGH (ref 0–149)
VLDL Cholesterol Cal: 34 mg/dL (ref 5–40)

## 2013-09-11 ENCOUNTER — Other Ambulatory Visit: Payer: No Typology Code available for payment source

## 2013-09-15 ENCOUNTER — Encounter: Payer: Self-pay | Admitting: Internal Medicine

## 2013-09-15 ENCOUNTER — Ambulatory Visit (INDEPENDENT_AMBULATORY_CARE_PROVIDER_SITE_OTHER): Payer: Medicare Other | Admitting: Internal Medicine

## 2013-09-15 VITALS — BP 156/92 | HR 76 | Temp 98.2°F | Resp 10 | Ht 68.5 in | Wt 219.0 lb

## 2013-09-15 DIAGNOSIS — G609 Hereditary and idiopathic neuropathy, unspecified: Secondary | ICD-10-CM

## 2013-09-15 DIAGNOSIS — E119 Type 2 diabetes mellitus without complications: Secondary | ICD-10-CM | POA: Diagnosis not present

## 2013-09-15 DIAGNOSIS — M069 Rheumatoid arthritis, unspecified: Secondary | ICD-10-CM

## 2013-09-15 DIAGNOSIS — M1990 Unspecified osteoarthritis, unspecified site: Secondary | ICD-10-CM

## 2013-09-15 DIAGNOSIS — M25559 Pain in unspecified hip: Secondary | ICD-10-CM

## 2013-09-15 DIAGNOSIS — E1149 Type 2 diabetes mellitus with other diabetic neurological complication: Secondary | ICD-10-CM

## 2013-09-15 DIAGNOSIS — E1142 Type 2 diabetes mellitus with diabetic polyneuropathy: Secondary | ICD-10-CM

## 2013-09-15 DIAGNOSIS — M25569 Pain in unspecified knee: Secondary | ICD-10-CM

## 2013-09-15 DIAGNOSIS — E785 Hyperlipidemia, unspecified: Secondary | ICD-10-CM

## 2013-09-15 DIAGNOSIS — C61 Malignant neoplasm of prostate: Secondary | ICD-10-CM

## 2013-09-15 DIAGNOSIS — E669 Obesity, unspecified: Secondary | ICD-10-CM | POA: Insufficient documentation

## 2013-09-15 DIAGNOSIS — M25519 Pain in unspecified shoulder: Secondary | ICD-10-CM | POA: Insufficient documentation

## 2013-09-15 DIAGNOSIS — G894 Chronic pain syndrome: Secondary | ICD-10-CM

## 2013-09-15 DIAGNOSIS — M059 Rheumatoid arthritis with rheumatoid factor, unspecified: Secondary | ICD-10-CM | POA: Insufficient documentation

## 2013-09-15 DIAGNOSIS — E1140 Type 2 diabetes mellitus with diabetic neuropathy, unspecified: Secondary | ICD-10-CM

## 2013-09-15 DIAGNOSIS — G4700 Insomnia, unspecified: Secondary | ICD-10-CM

## 2013-09-15 DIAGNOSIS — D696 Thrombocytopenia, unspecified: Secondary | ICD-10-CM

## 2013-09-15 MED ORDER — TRAZODONE HCL 150 MG PO TABS: ORAL_TABLET | ORAL | Status: DC

## 2013-09-15 MED ORDER — MORPHINE SULFATE ER 60 MG PO CP24: ORAL_CAPSULE | ORAL | Status: DC

## 2013-09-15 NOTE — Progress Notes (Signed)
Patient ID: Ronnie Jackson., male   DOB: [birth year], 78 y.o.   MRN: [id number]    Location:    [facility]  Place of Service:  OFFICE  PCP: Estill Dooms, MD  Code Status: full  Allergies  Allergen Reactions  . Indocin [Indomethacin]   . Methadone     Chief Complaint  Patient presents with  . Annual Exam    Yearly check-up  . Back Pain    Back pain worse x 1 year, pain is very extreme at times and radiates into pelvic area   . Leg Pain    Bilateral leg pain/throbbing x 2 weeks   . Medication Management    Discuss Trazodone and Morphine, not helping, discuss increasing   . Screening Test    Depression screening test completed     HPI:    Chronic pain syndrome: Pain in the lower back, right groin, and left knee. Had injection in the right knee about 2012 which helped. Left knee stays swollen. New pains in the the left shoulder anteriorly. Most of this patient's pains have been felt related to osteoarthritis and degenerative joint problems. He does have a positive rheumatoid arthritis test.  Type II or unspecified type diabetes mellitus without mention of complication, not stated as uncontrolled: Controlled  Unspecified hereditary and idiopathic peripheral neuropathy: Lack of vibratory sensation in feet. Feelings.  Other and unspecified hyperlipidemia: Controlled  Pain in joint, shoulder region: New. Anteriorly. Old rupture of the long head of the biceps.  Pain, knee: Chronic. Makes walking difficult. Worse on the left side.  Pain in joint, pelvic region and thigh : Pain in the right groin is getting worse. This impairs walking. He is ready to see an orthopedist.  Insomnia, unspecified - : Most recent prescription for trazodone does not help him sleep. Higher dosages have been helpful in the past  Obesity (BMI 30-39.9)Chronic problem. Poor dietary compliance.  Thrombocytopenia: Chronic. Recent lab shows a low platelet count, but it is improved over  previous.     Past Medical History  Diagnosis Date  . Unspecified arthropathy, pelvic region and thigh   . Herpes simplex disease   . Pain in joint, lower leg     Bilateral knee pains  . Intestinovesical fistula   . Thrombocytopenia, unspecified   . Anxiety state, unspecified   . Depressive disorder, not elsewhere classified   . Chronic pain syndrome   . Hypopotassemia   . Insomnia, unspecified   . Dysphagia, unspecified(787.20)   . Spinal stenosis, unspecified region other than cervical   . Type II or unspecified type diabetes mellitus without mention of complication, not stated as uncontrolled   . Reflux esophagitis   . Hyperlipidemia   . Constipation   . Unspecified hereditary and idiopathic peripheral neuropathy   . Hypertension   . Myalgia and myositis, unspecified   . Malignant neoplasm of prostate   . Rheumatoid arthritis with rheumatoid factor   . Osteoarthritis     Past Surgical History  Procedure Laterality Date  . Prostate surgery  1991  . Nasal sinus surgery  1992  . Cardiac catheterization  01/31/2004    Dr Gwenlyn Found  . Colonoscopy  08/09/2006    Dr Christian Mate, hemorrhoids/rectal fistula  . Cystocopy  07/2006    Dr Harlow Asa    CONSULTANTS ENT: There is a Cardiology: [facility] Pulmonary: CD Young Neurosurgery: [facility] GI: Christian Mate Urology: Puchinsky Rheumatology: Bo Merino  PAST PROCEDURES 06/14/03 CT head: Small left maxillary sinus retention cyst  or polyp 12/2003 2-D echo: Mild concentric left ventricular hypertrophy 12/2003 Cardiolite stress test: Lateral ischemia towards the apex of the heart 05/28/05 CT paranasal sinuses 05/28/05 CT thoracic spine: Extensive degenerative arthritic changes 08/09/06 colonoscopy(Dr. 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 Puchinsky)  11/25/09 ultrasound abdomen: 1.  Fatty infiltration of the liver.  No ductal dilatation.   The liver is not optimally visualized. 2.  No gallstones. 3.  Much of the anatomy is obscured by bowel gas and patient body Habitus.  02/08/10 MR lower extremities:1.  Moderately severe asymmetric right hip joint degenerative changes may account for the patient's right hip pain. 2.  No MR findings for bony metastatic disease. 3.  No hip fracture or AVN. 4.  No significant intrapelvic abnormalities are demonstrated.  09/17/12 MR lumbar spine with contrast: 7 mm marrow space focus within the L2 vertebral body is unchanged and not likely of clinical significance.   L4-5:  Facet degeneration and ligamentous hypertrophy.  1-2 mm of anterolisthesis.  Bulging of the disc.  Mild narrowing of the lateral recesses without gross neural compression.   L5-S1:  Chronic bilateral pars interarticularis defects.  2 mm of anterolisthesis.  Bulging of the disc.  No apparent neural compression.  There is not any appreciable edema associated with the pars defects and therefore they may not be significant. However, they could possibly relate to back pain.  11/21/12 MR cervical spine with contrast:1.  Degenerative cervical spondylosis with disc disease and facet disease. 2.  Shallow central disc protrusions at C2-3, C3-4 and C4-5. 3.  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  No spinal or foraminal stenosis. 4.  Advanced degenerative disc disease at C6-7 with mild spinal and right greater than left foraminal encroachment.    Social History: History   Social History  . Marital Status: Married    Spouse Name: N/A    Number of Children: N/A  . Years of Education: N/A   Social History Main Topics  . Smoking status: Current Some Day Smoker  . Smokeless tobacco: None     Comment: Chews tobacco daily   . Alcohol Use: No  . Drug Use: No  . Sexual Activity: None   Other Topics Concern  . None   Social History Narrative  . None    Family History Family Status  Relation Status Death Age  . Father Deceased   .  Mother Deceased   . Brother Alive   . Sister Alive   . Sister Alive   . Daughter Deceased   . Son Alive   . Daughter Alive    Family History  Problem Relation Age of Onset  . Prostate cancer Father   . Heart failure Mother   . Arthritis Sister   . Lung cancer Daughter      Medications: Patient's Medications  New Prescriptions   No medications on file  Previous Medications   LISINOPRIL-HYDROCHLOROTHIAZIDE (PRINZIDE,ZESTORETIC) 20-12.5 MG PER TABLET    Take one tablet once daily to control blood pressure   MELATONIN 3 MG TABS    Take one to two tablets at bedtime to help rest   MORPHINE (MSIR) 30 MG TABLET    Take one tablet twice daily as needed for pain.   TRAZODONE (DESYREL) 50 MG TABLET    Take 0.5-1 tablets (25-50 mg total) by mouth at bedtime as needed for sleep.  Modified Medications   No medications on file  Discontinued Medications   No medications on file  There is no immunization history on file for this patient.   Review of Systems  Constitutional: Positive for fatigue. Negative for fever, activity change and appetite change.  HENT: Positive for hearing loss. Negative for facial swelling, rhinorrhea, sneezing and sore throat.   Eyes: Negative.   Respiratory: Negative for apnea, cough, choking, chest tightness and shortness of breath.   Cardiovascular: Negative for chest pain, palpitations and leg swelling.  Gastrointestinal: Positive for constipation. Negative for nausea, abdominal pain, diarrhea, blood in stool and abdominal distention.  Endocrine: Negative for cold intolerance, heat intolerance, polydipsia, polyphagia and polyuria.       Diabetes mellitus  Genitourinary: Positive for urgency and frequency. Negative for discharge, scrotal swelling, penile pain and testicular pain.  Musculoskeletal: Positive for arthralgias, back pain, myalgias, neck pain and neck stiffness.  Skin: Negative.   Allergic/Immunologic: Negative.   Neurological: Positive for  numbness. Negative for dizziness, tremors, seizures, syncope, facial asymmetry, speech difficulty and headaches.  Hematological: Negative for adenopathy. Does not bruise/bleed easily.       Chronic thrombocytopenia.  Psychiatric/Behavioral: Positive for sleep disturbance and dysphoric mood. The patient is nervous/anxious.       Filed Vitals:   09/15/13 1430  BP: 156/92  Pulse: 76  Temp: 98.2 F (36.8 C)  TempSrc: Oral  Resp: 10  Height: 5' 8.5" (1.74 m)  Weight: 219 lb (99.338 kg)  SpO2: 97%   Physical Exam     Labs reviewed: Appointment on 09/09/2013  Component Date Value Ref Range Status  . WBC 09/09/2013 6.3  3.4 - 10.8 x10E3/uL Final  . RBC 09/09/2013 4.25  4.14 - 5.80 x10E6/uL Final  . Hemoglobin 09/09/2013 13.0  12.6 - 17.7 g/dL Final  . HCT 09/09/2013 38.7  37.5 - 51.0 % Final  . MCV 09/09/2013 91  79 - 97 fL Final  . MCH 09/09/2013 30.6  26.6 - 33.0 pg Final  . MCHC 09/09/2013 33.6  31.5 - 35.7 g/dL Final  . RDW 09/09/2013 14.4  12.3 - 15.4 % Final  . Platelets 09/09/2013 115* 150 - 379 x10E3/uL Final  . Neutrophils Relative % 09/09/2013 55   Final  . Lymphs 09/09/2013 35   Final  . Monocytes 09/09/2013 8   Final  . Eos 09/09/2013 2   Final  . Basos 09/09/2013 0   Final  . Neutrophils Absolute 09/09/2013 3.5  1.4 - 7.0 x10E3/uL Final  . Lymphocytes Absolute 09/09/2013 2.2  0.7 - 3.1 x10E3/uL Final  . Monocytes Absolute 09/09/2013 0.5  0.1 - 0.9 x10E3/uL Final  . Eosinophils Absolute 09/09/2013 0.1  0.0 - 0.4 x10E3/uL Final  . Basophils Absolute 09/09/2013 0.0  0.0 - 0.2 x10E3/uL Final  . Immature Granulocytes 09/09/2013 0   Final  . Immature Grans (Abs) 09/09/2013 0.0  0.0 - 0.1 x10E3/uL Final  . Cholesterol, Total 09/09/2013 168  100 - 199 mg/dL Final  . Triglycerides 09/09/2013 168* 0 - 149 mg/dL Final  . HDL 09/09/2013 34* >39 mg/dL Final   Comment: According to ATP-III Guidelines, HDL-C >59 mg/dL is considered a                          negative risk  factor for CHD.  Marland Kitchen VLDL Cholesterol Cal 09/09/2013 34  5 - 40 mg/dL Final  . LDL Calculated 09/09/2013 100* 0 - 99 mg/dL Final  . Chol/HDL Ratio 09/09/2013 4.9  0.0 - 5.0 ratio units Final   Comment:  T. Chol/HDL Ratio                                                                      Men  Women                                                        1/2 Avg.Risk  3.4    3.3                                                            Avg.Risk  5.0    4.4                                                         2X Avg.Risk  9.6    7.1                                                         3X Avg.Risk 23.4   11.0  Abstract on 09/04/2013  Component Date Value Ref Range Status  . HM Colonoscopy 08/09/2006 Dr Rodenberg-Hemorrhoids repeat ? 5 yrs   Final  Office Visit on 07/30/2013  Component Date Value Ref Range Status  . Glucose 07/30/2013 114* 65 - 99 mg/dL Final  . BUN 07/30/2013 12  8 - 27 mg/dL Final  . Creatinine, Ser 07/30/2013 1.15  0.76 - 1.27 mg/dL Final  . GFR calc non Af Amer 07/30/2013 62  >59 mL/min/1.73 Final  . GFR calc Af Amer 07/30/2013 72  >59 mL/min/1.73 Final  . BUN/Creatinine Ratio 07/30/2013 10  10 - 22 Final  . Sodium 07/30/2013 139  134 - 144 mmol/L Final  . Potassium 07/30/2013 4.1  3.5 - 5.2 mmol/L Final  . Chloride 07/30/2013 100  97 - 108 mmol/L Final  . CO2 07/30/2013 24  18 - 29 mmol/L Final  . Calcium 07/30/2013 8.7  8.6 - 10.2 mg/dL Final   Comment: **Effective August 10, 2013 the reference interval**                            for Calcium, Serum will be changing to:                                       Age                Male          Male  0 - 10 days        8.6 - 10.4     8.6 - 10.4                              11 days -  1 year        9.2 - 11.0     9.2 - 11.0                                    2 - 11 years       9.1 - 10.5     9.1 - 10.5                                   12 - 17  years       8.9 - 10.4     8.9 - 10.4                                   18 - 59 years       8.7 - 10.2     8.7 - 10.2                                       >59 years       8.6 - 10.2     8.7 - 10.3  . Total Protein 07/30/2013 6.4  6.0 - 8.5 g/dL Final  . Albumin 07/30/2013 4.2  3.5 - 4.8 g/dL Final  . Globulin, Total 07/30/2013 2.2  1.5 - 4.5 g/dL Final  . Albumin/Globulin Ratio 07/30/2013 1.9  1.1 - 2.5 Final  . Total Bilirubin 07/30/2013 0.3  0.0 - 1.2 mg/dL Final  . Alkaline Phosphatase 07/30/2013 67  39 - 117 IU/L Final  . AST 07/30/2013 23  0 - 40 IU/L Final  . ALT 07/30/2013 17  0 - 44 IU/L Final  . Hemoglobin A1C 07/30/2013 6.6* 4.8 - 5.6 % Final   Comment:          Increased risk for diabetes: 5.7 - 6.4                                   Diabetes: >6.4                                   Glycemic control for adults with diabetes: <7.0  . Estimated average glucose 07/30/2013 143   Final     Assessment/Plan  Chronic pain syndrome: Increased morphine sulfate to 60 mg twice daily  Type II or unspecified type diabetes mellitus without mention of complication, not stated as uncontrolled : Controlled  - Plan: Hemoglobin O5D, Basic metabolic panel, Microalbumin / creatinine urine ratio  Unspecified hereditary and idiopathic peripheral neuropathy: Persistent numbness in the feet most likely related to his diabetes.  Other and unspecified hyperlipidemia: Controlled   - Plan: Lipid panel  Pain in joint, shoulder region  Injected with 1/2 cc Kenalog-40 and 1 cc 1% lidocaine. Immediate relief.  Pain, knee  Injected left knee with 1 cc Kenalog-40 and 2 cc 1% lidocaine. Immediate relief  Pain in joint, pelvic region and thigh: Most likely significant degenerative joint disease of the right hip. Patient was advised this may require surgery to relieve.   - Plan: Ambulatory referral to Orthopedic Surgery  Insomnia, unspecified - Plan: traZODone (DESYREL) 150 MG tablet  Obesity (BMI  30-39.9): Advised dietary compliance and weight loss  Thrombocytopenia, unspecified: Continue to monitor  Osteoarthritis: Degenerative joint disease seems to be the main problem with his chronic pain. Multiple joints are involved.  Rheumatoid arthritis with rheumatoid factor: Abnormal lab test. Sedimentation rate has not been particularly high in the past I am uncertain whether this test is sufficient to indicate a true rheumatoid arthritis. Continue to monitor.  Malignant neoplasm of prostate: no relapse in over 20 years. No longer followed by urology. Periodic check of PSA is warranted.

## 2013-09-15 NOTE — Patient Instructions (Signed)
Continue current medication.

## 2013-09-24 DIAGNOSIS — M25559 Pain in unspecified hip: Secondary | ICD-10-CM | POA: Diagnosis not present

## 2013-09-24 DIAGNOSIS — M1710 Unilateral primary osteoarthritis, unspecified knee: Secondary | ICD-10-CM | POA: Diagnosis not present

## 2013-10-05 ENCOUNTER — Other Ambulatory Visit: Payer: Self-pay | Admitting: *Deleted

## 2013-10-05 DIAGNOSIS — G4700 Insomnia, unspecified: Secondary | ICD-10-CM

## 2013-10-05 MED ORDER — TRAZODONE HCL 150 MG PO TABS: ORAL_TABLET | ORAL | Status: DC

## 2013-10-05 NOTE — Telephone Encounter (Signed)
[facility] [address] 

## 2013-10-12 ENCOUNTER — Other Ambulatory Visit: Payer: Self-pay | Admitting: *Deleted

## 2013-10-12 DIAGNOSIS — M25559 Pain in unspecified hip: Secondary | ICD-10-CM | POA: Diagnosis not present

## 2013-10-12 MED ORDER — MORPHINE SULFATE 30 MG PO TABS: ORAL_TABLET | ORAL | Status: DC

## 2013-10-12 NOTE — Telephone Encounter (Signed)
Patient came in and handed a note to front desk to give to me and it stated:  Rodena Piety, I am not sure if this is very clear but Mr. Kumari said that the last time he was here Dr. Nyoka Cowden upped his Rx from 30 to 20 and changed it to capsules and he thinks that by him changing the RX to capsules it increased the amount of his prescription. He would also like to get a 90 supply just like his son he said. He said previously his prescription cost him $3.00 and now they are asking almost $300.00 for them. If you have any questions you can give them a call. (He was upset and asking for Dr. Nyoka Cowden, saying he has been out of his prescription since Friday. I hope this makes sense I tried to make it a little clearer then they put on the form.  I called the pharmacy to confirm what was going on and they stated that the Morphine 60 capsules are newer and only comes in a brand pill and this is where the price is probably coming from. Suggested to change it back to the 30mg  tablets and just increase the way he takes it. New Rx Printed and wife Notified and agreed.  New Rx:    Morphine 30mg  Two tablets by mouth every 12 hours as needed for pain. Dr. Mariea Clonts signed

## 2013-11-05 ENCOUNTER — Other Ambulatory Visit: Payer: Self-pay | Admitting: *Deleted

## 2013-11-05 MED ORDER — SULFAMETHOXAZOLE-TMP DS 800-160 MG PO TABS: ORAL_TABLET | ORAL | Status: DC

## 2013-11-05 NOTE — Telephone Encounter (Signed)
Patient requested and patient does take as needed for the fistula

## 2013-11-16 ENCOUNTER — Other Ambulatory Visit: Payer: Self-pay | Admitting: *Deleted

## 2013-11-16 MED ORDER — MORPHINE SULFATE 30 MG PO TABS: ORAL_TABLET | ORAL | Status: DC

## 2014-01-01 ENCOUNTER — Other Ambulatory Visit: Payer: Self-pay

## 2014-01-01 MED ORDER — MORPHINE SULFATE 30 MG PO TABS: ORAL_TABLET | ORAL | Status: DC

## 2014-01-01 NOTE — Telephone Encounter (Signed)
RX placed on ledge for signature then to the front for pick-up

## 2014-02-05 ENCOUNTER — Other Ambulatory Visit: Payer: Self-pay

## 2014-02-05 ENCOUNTER — Other Ambulatory Visit: Payer: Self-pay | Admitting: *Deleted

## 2014-02-05 MED ORDER — MORPHINE SULFATE 30 MG PO TABS
ORAL_TABLET | ORAL | Status: DC
Start: 2014-02-05 — End: 2014-02-05

## 2014-02-05 MED ORDER — MORPHINE SULFATE 30 MG PO TABS: ORAL_TABLET | ORAL | Status: DC

## 2014-02-22 ENCOUNTER — Other Ambulatory Visit: Payer: Medicare Other

## 2014-02-22 DIAGNOSIS — E119 Type 2 diabetes mellitus without complications: Secondary | ICD-10-CM

## 2014-02-22 DIAGNOSIS — E785 Hyperlipidemia, unspecified: Secondary | ICD-10-CM

## 2014-02-23 LAB — BASIC METABOLIC PANEL
BUN/Creatinine Ratio: 14 (ref 10–22)
BUN: 13 mg/dL (ref 8–27)
CO2: 23 mmol/L (ref 18–29)
Calcium: 8.6 mg/dL (ref 8.6–10.2)
Chloride: 104 mmol/L (ref 97–108)
Creatinine, Ser: 0.94 mg/dL (ref 0.76–1.27)
GFR calc Af Amer: 91 mL/min/{1.73_m2} (ref >59)
GFR calc non Af Amer: 78 mL/min/{1.73_m2} (ref >59)
Glucose: 99 mg/dL (ref 65–99)
Potassium: 4.3 mmol/L (ref 3.5–5.2)
Sodium: 143 mmol/L (ref 134–144)

## 2014-02-23 LAB — MICROALBUMIN / CREATININE URINE RATIO
Creatinine, Ur: 128.8 mg/dL (ref 22.0–328.0)
MICROALB/CREAT RATIO: 13.1 mg/g creat (ref 0.0–30.0)
Microalbumin, Urine: 16.9 ug/mL (ref 0.0–17.0)

## 2014-02-23 LAB — LIPID PANEL
Chol/HDL Ratio: 4.3 ratio units (ref 0.0–5.0)
Cholesterol, Total: 165 mg/dL (ref 100–199)
HDL: 38 mg/dL — ABNORMAL LOW (ref >39)
LDL Calculated: 91 mg/dL (ref 0–99)
Triglycerides: 181 mg/dL — ABNORMAL HIGH (ref 0–149)
VLDL Cholesterol Cal: 36 mg/dL (ref 5–40)

## 2014-02-23 LAB — HEMOGLOBIN A1C
Est. average glucose Bld gHb Est-mCnc: 128 mg/dL
Hgb A1c MFr Bld: 6.1 % — ABNORMAL HIGH (ref 4.8–5.6)

## 2014-02-24 ENCOUNTER — Ambulatory Visit (INDEPENDENT_AMBULATORY_CARE_PROVIDER_SITE_OTHER): Payer: Medicare Other | Admitting: Internal Medicine

## 2014-02-24 ENCOUNTER — Encounter: Payer: Self-pay | Admitting: Internal Medicine

## 2014-02-24 VITALS — BP 140/82 | HR 69 | Temp 98.1°F | Ht 68.5 in | Wt 215.0 lb

## 2014-02-24 DIAGNOSIS — E669 Obesity, unspecified: Secondary | ICD-10-CM

## 2014-02-24 DIAGNOSIS — G894 Chronic pain syndrome: Secondary | ICD-10-CM

## 2014-02-24 DIAGNOSIS — E1149 Type 2 diabetes mellitus with other diabetic neurological complication: Secondary | ICD-10-CM

## 2014-02-24 DIAGNOSIS — E1142 Type 2 diabetes mellitus with diabetic polyneuropathy: Secondary | ICD-10-CM | POA: Diagnosis not present

## 2014-02-24 DIAGNOSIS — R1013 Epigastric pain: Secondary | ICD-10-CM

## 2014-02-24 DIAGNOSIS — K3189 Other diseases of stomach and duodenum: Secondary | ICD-10-CM | POA: Diagnosis not present

## 2014-02-24 DIAGNOSIS — D696 Thrombocytopenia, unspecified: Secondary | ICD-10-CM | POA: Diagnosis not present

## 2014-02-24 DIAGNOSIS — K30 Functional dyspepsia: Secondary | ICD-10-CM

## 2014-02-24 MED ORDER — PREGABALIN 75 MG PO CAPS: ORAL_CAPSULE | ORAL | Status: DC

## 2014-02-24 NOTE — Progress Notes (Signed)
Patient ID: Ronnie Galea., male   DOB: 05/29/1936, 78 y.o.   MRN: [id number]    Location:    [facility]  Place of Service:  OFFICE    Allergies  Allergen Reactions  . Indocin [Indomethacin]   . Methadone     Chief Complaint  Patient presents with  . Medical Management of Chronic Issues    6 month folllow-up, discuss labs (copy printed)   . Pain    Generalized body pain- legs,knee, shoulders and back     HPI:  DM type 2 with diabetic peripheral neuropathy -controlled   Thrombocytopenia, unspecified: Lab 09/09/13 showed platelet count 115,000  Obesity (BMI 30-39.9): Chronic. Continues to be noncompliant with diet and fails to lose weight.  Diabetic polyneuropathy associated with type 2 diabetes mellitus - continues with pain in the feet as well as abdomen burning sensations   Chronic pain syndrome: Continues with terrible arthritis and pain in the back, shoulders, hips, and knees related to this. Diabetic polyneuropathy also contributes to his chronic pains.  Indigestion: Occasional indigestion and heartburn    Medications: Patient's Medications  New Prescriptions   No medications on file  Previous Medications   LISINOPRIL-HYDROCHLOROTHIAZIDE (PRINZIDE,ZESTORETIC) 20-12.5 MG PER TABLET    Take one tablet once daily to control blood pressure   MELATONIN 3 MG TABS    Take one to two tablets at bedtime to help rest   MORPHINE (MSIR) 30 MG TABLET    Take two tablets by mouth every 12 hours as needed for pain   SULFAMETHOXAZOLE-TRIMETHOPRIM (BACTRIM DS) 800-160 MG PER TABLET    Take one tablet by mouth twice daily as needed for intestinovesical fistula   TRAZODONE (DESYREL) 150 MG TABLET    Take one tablet by mouth at bedtime for rest  Modified Medications   No medications on file  Discontinued Medications   No medications on file     Review of Systems  Constitutional: Positive for fatigue. Negative for fever, activity change and appetite change.  HENT: Positive for  hearing loss. Negative for facial swelling, rhinorrhea, sneezing and sore throat.   Eyes: Negative.   Respiratory: Negative for apnea, cough, choking, chest tightness and shortness of breath.   Cardiovascular: Negative for chest pain, palpitations and leg swelling.  Gastrointestinal: Positive for constipation. Negative for nausea, abdominal pain, diarrhea, blood in stool and abdominal distention.  Endocrine: Negative for cold intolerance, heat intolerance, polydipsia, polyphagia and polyuria.       Diabetes mellitus  Genitourinary: Positive for urgency and frequency. Negative for discharge, scrotal swelling, penile pain and testicular pain.  Musculoskeletal: Positive for arthralgias, back pain, myalgias, neck pain and neck stiffness.  Skin: Negative.   Allergic/Immunologic: Negative.   Neurological: Positive for numbness. Negative for dizziness, tremors, seizures, syncope, facial asymmetry, speech difficulty and headaches.  Hematological: Negative for adenopathy. Does not bruise/bleed easily.       Chronic thrombocytopenia.  Psychiatric/Behavioral: Positive for sleep disturbance and dysphoric mood. The patient is nervous/anxious.     Filed Vitals:   02/24/14 1457  BP: 140/82  Pulse: 69  Temp: 98.1 F (36.7 C)  TempSrc: Oral  Height: 5' 8.5" (1.74 m)  Weight: 215 lb (97.523 kg)  SpO2: 98%   Body mass index is 32.21 kg/(m^2).  Physical Exam  Constitutional: He is oriented to person, place, and time. He appears well-developed and well-nourished. No distress.  Overweight  HENT:  Right Ear: External ear normal.  Left Ear: External ear normal.  Nose: Nose normal.  Mouth/Throat: Oropharynx is clear and moist. No oropharyngeal exudate.  Significant partial hearing loss  Eyes: Conjunctivae and EOM are normal. Pupils are equal, round, and reactive to light.  Neck: No JVD present. No tracheal deviation present. No thyromegaly present.  Cardiovascular: Normal rate, regular rhythm, normal  heart sounds and intact distal pulses.  Exam reveals no gallop and no friction rub.   No murmur heard. Pulmonary/Chest: No respiratory distress. He has no wheezes. He has no rales. He exhibits no tenderness.  Abdominal: He exhibits no distension and no mass. There is no tenderness.  Musculoskeletal: He exhibits edema and tenderness.  Multiple painful areas including the neck, shoulders, back, hips, and knees. Diminished range of motion at the shoulders.  Lymphadenopathy:    He has no cervical adenopathy.  Neurological: He is alert and oriented to person, place, and time. He has normal reflexes. No cranial nerve deficit. Coordination normal.  Skin: No rash noted. No erythema. No pallor.  Psychiatric: He has a normal mood and affect. His behavior is normal. Judgment and thought content normal.     Labs reviewed: Lab on 02/22/2014  Component Date Value Ref Range Status  . Hemoglobin A1C 02/22/2014 6.1* 4.8 - 5.6 % Final   Comment:          Increased risk for diabetes: 5.7 - 6.4                                   Diabetes: >6.4                                   Glycemic control for adults with diabetes: <7.0  . Estimated average glucose 02/22/2014 128   Final  . Cholesterol, Total 02/22/2014 165  100 - 199 mg/dL Final  . Triglycerides 02/22/2014 181* 0 - 149 mg/dL Final  . HDL 02/22/2014 38* >39 mg/dL Final   Comment: According to ATP-III Guidelines, HDL-C >59 mg/dL is considered a                          negative risk factor for CHD.  Marland Kitchen VLDL Cholesterol Cal 02/22/2014 36  5 - 40 mg/dL Final  . LDL Calculated 02/22/2014 91  0 - 99 mg/dL Final  . Chol/HDL Ratio 02/22/2014 4.3  0.0 - 5.0 ratio units Final   Comment:                                   T. Chol/HDL Ratio                                                                      Men  Women                                                        1/2 Avg.Risk  3.4  3.3                                                             Avg.Risk  5.0    4.4                                                         2X Avg.Risk  9.6    7.1                                                         3X Avg.Risk 23.4   11.0  . Creatinine, Ur 02/22/2014 128.8  22.0 - 328.0 mg/dL Final  . Microalbum.,U,Random 02/22/2014 16.9  0.0 - 17.0 ug/mL Final  . MICROALB/CREAT RATIO 02/22/2014 13.1  0.0 - 30.0 mg/g creat Final  . Glucose 02/22/2014 99  65 - 99 mg/dL Final  . BUN 02/22/2014 13  8 - 27 mg/dL Final  . Creatinine, Ser 02/22/2014 0.94  0.76 - 1.27 mg/dL Final  . GFR calc non Af Amer 02/22/2014 78  >59 mL/min/1.73 Final  . GFR calc Af Amer 02/22/2014 91  >59 mL/min/1.73 Final  . BUN/Creatinine Ratio 02/22/2014 14  10 - 22 Final  . Sodium 02/22/2014 143  134 - 144 mmol/L Final  . Potassium 02/22/2014 4.3  3.5 - 5.2 mmol/L Final  . Chloride 02/22/2014 104  97 - 108 mmol/L Final  . CO2 02/22/2014 23  18 - 29 mmol/L Final  . Calcium 02/22/2014 8.6  8.6 - 10.2 mg/dL Final      Assessment/Plan  1. DM type 2 with diabetic peripheral neuropathy - Hemoglobin A1c; Future - Basic metabolic panel; Future  2. Thrombocytopenia, unspecified Repeat lab future  3. Obesity (BMI 30-39.9) Emphasized dietary compliance  4. Diabetic polyneuropathy associated with type 2 diabetes mellitus - pregabalin (LYRICA) 75 MG capsule; One twice daily to help neuropathic pain in feet  Dispense: 60 capsule; Refill: 5  5. Chronic pain syndrome Hopefully Lyrica will help pains  6. Indigestion Continue to monitor. No new medications.

## 2014-02-28 ENCOUNTER — Other Ambulatory Visit: Payer: Self-pay | Admitting: Internal Medicine

## 2014-03-12 ENCOUNTER — Other Ambulatory Visit: Payer: Self-pay | Admitting: *Deleted

## 2014-03-12 MED ORDER — MORPHINE SULFATE 30 MG PO TABS: ORAL_TABLET | ORAL | Status: DC

## 2014-03-12 NOTE — Telephone Encounter (Signed)
Patient Requested 

## 2014-04-12 ENCOUNTER — Other Ambulatory Visit: Payer: Self-pay | Admitting: *Deleted

## 2014-04-12 MED ORDER — MORPHINE SULFATE 30 MG PO TABS: ORAL_TABLET | ORAL | Status: DC

## 2014-04-12 NOTE — Telephone Encounter (Signed)
Wife, Elisabeth Most and will pick up

## 2014-05-11 ENCOUNTER — Other Ambulatory Visit: Payer: Self-pay | Admitting: *Deleted

## 2014-05-11 MED ORDER — MORPHINE SULFATE 30 MG PO TABS: ORAL_TABLET | ORAL | Status: DC

## 2014-05-11 NOTE — Telephone Encounter (Signed)
Patient wife Elisabeth Most and will pick up

## 2014-06-06 ENCOUNTER — Other Ambulatory Visit: Payer: Self-pay | Admitting: Internal Medicine

## 2014-06-08 DIAGNOSIS — Z5181 Encounter for therapeutic drug level monitoring: Secondary | ICD-10-CM | POA: Diagnosis not present

## 2014-06-09 ENCOUNTER — Other Ambulatory Visit: Payer: Self-pay | Admitting: *Deleted

## 2014-06-09 MED ORDER — MORPHINE SULFATE 30 MG PO TABS: ORAL_TABLET | ORAL | Status: DC

## 2014-06-09 NOTE — Telephone Encounter (Signed)
Patient wife requested and will pick up 

## 2014-06-29 ENCOUNTER — Encounter: Payer: Self-pay | Admitting: Internal Medicine

## 2014-06-29 ENCOUNTER — Ambulatory Visit (INDEPENDENT_AMBULATORY_CARE_PROVIDER_SITE_OTHER): Payer: Medicare Other | Admitting: Internal Medicine

## 2014-06-29 VITALS — BP 148/86 | HR 70 | Temp 98.1°F | Ht 68.5 in | Wt 221.0 lb

## 2014-06-29 DIAGNOSIS — G894 Chronic pain syndrome: Secondary | ICD-10-CM

## 2014-06-29 DIAGNOSIS — R1314 Dysphagia, pharyngoesophageal phase: Secondary | ICD-10-CM | POA: Insufficient documentation

## 2014-06-29 DIAGNOSIS — E1140 Type 2 diabetes mellitus with diabetic neuropathy, unspecified: Secondary | ICD-10-CM | POA: Diagnosis not present

## 2014-06-29 DIAGNOSIS — G629 Polyneuropathy, unspecified: Secondary | ICD-10-CM | POA: Diagnosis not present

## 2014-06-29 DIAGNOSIS — E1142 Type 2 diabetes mellitus with diabetic polyneuropathy: Secondary | ICD-10-CM | POA: Diagnosis not present

## 2014-06-29 DIAGNOSIS — M059 Rheumatoid arthritis with rheumatoid factor, unspecified: Secondary | ICD-10-CM

## 2014-06-29 DIAGNOSIS — E785 Hyperlipidemia, unspecified: Secondary | ICD-10-CM | POA: Diagnosis not present

## 2014-06-29 DIAGNOSIS — M069 Rheumatoid arthritis, unspecified: Secondary | ICD-10-CM | POA: Diagnosis not present

## 2014-06-29 NOTE — Progress Notes (Signed)
Patient ID: Ronnie Jackson., male   DOB: February 12, 1936, 78 y.o.   MRN: [id number]    Facility  [facility]    Place of Service:   OFFICE   Allergies  Allergen Reactions  . Indocin [Indomethacin]   . Methadone     Chief Complaint  Patient presents with  . Medical Management of Chronic Issues    4 month follow-up, fasting for any labs due     HPI:  Some days I hurt so bad that I just can't make it. Tingling feeling inside in the muscles all over my body. Sleeps in a recliner chair at night.  A friend told him Indocin helped. He has some indigestion. No ulcer history.  Hurts in the lower back, right hip, and left knee.  Has dysphagia. Has to swallow water after eating anything. Occasional odynophagia.  Medications: Patient's Medications  New Prescriptions   No medications on file  Previous Medications   LISINOPRIL-HYDROCHLOROTHIAZIDE (PRINZIDE,ZESTORETIC) 20-12.5 MG PER TABLET    Take one tablet once daily to control blood pressure   MELATONIN 3 MG TABS    Take one to two tablets at bedtime to help rest   MORPHINE (MSIR) 30 MG TABLET    Take two tablets by mouth every 12 hours as needed for pain   PREGABALIN (LYRICA) 75 MG CAPSULE    One twice daily to help neuropathic pain in feet   SULFAMETHOXAZOLE-TRIMETHOPRIM (BACTRIM DS) 800-160 MG PER TABLET    TAKE ONE TABLET BY MOUTH TWICE DAILY AS NEEDED FOR INTESTINOVESICAL FISTULA   TRAZODONE (DESYREL) 150 MG TABLET    TAKE 1 TABLET AT BEDTIME FOR REST  Modified Medications   No medications on file  Discontinued Medications   No medications on file     Review of Systems  Constitutional: Positive for fatigue. Negative for fever, activity change and appetite change.  HENT: Positive for hearing loss. Negative for facial swelling, rhinorrhea, sneezing and sore throat.   Eyes: Negative.   Respiratory: Negative for apnea, cough, choking, chest tightness and shortness of breath.   Cardiovascular: Negative for chest pain, palpitations and  leg swelling.  Gastrointestinal: Positive for constipation. Negative for nausea, abdominal pain, diarrhea, blood in stool and abdominal distention.  Endocrine: Negative for cold intolerance, heat intolerance, polydipsia, polyphagia and polyuria.       Diabetes mellitus  Genitourinary: Positive for urgency and frequency. Negative for discharge, scrotal swelling, penile pain and testicular pain.  Musculoskeletal: Positive for myalgias, back pain, arthralgias, neck pain and neck stiffness.  Skin: Negative.   Allergic/Immunologic: Negative.   Neurological: Positive for numbness. Negative for dizziness, tremors, seizures, syncope, facial asymmetry, speech difficulty and headaches.  Hematological: Negative for adenopathy. Does not bruise/bleed easily.       Chronic thrombocytopenia.  Psychiatric/Behavioral: Positive for sleep disturbance and dysphoric mood. The patient is nervous/anxious.     Filed Vitals:   06/29/14 1349  BP: 148/86  Pulse: 70  Temp: 98.1 F (36.7 C)  TempSrc: Oral  Height: 5' 8.5" (1.74 m)  Weight: 221 lb (100.245 kg)  SpO2: 95%   Body mass index is 33.11 kg/(m^2).  Physical Exam  Constitutional: He is oriented to person, place, and time. He appears well-developed and well-nourished. No distress.  Overweight  HENT:  Right Ear: External ear normal.  Left Ear: External ear normal.  Nose: Nose normal.  Mouth/Throat: Oropharynx is clear and moist. No oropharyngeal exudate.  Significant partial hearing loss  Eyes: Conjunctivae and EOM are normal. Pupils are  equal, round, and reactive to light.  Neck: No JVD present. No tracheal deviation present. No thyromegaly present.  Cardiovascular: Normal rate, regular rhythm, normal heart sounds and intact distal pulses.  Exam reveals no gallop and no friction rub.   No murmur heard. Pulmonary/Chest: No respiratory distress. He has no wheezes. He has no rales. He exhibits no tenderness.  Abdominal: He exhibits no distension and  no mass. There is no tenderness.  Musculoskeletal: He exhibits edema and tenderness.  Multiple painful areas including the neck, shoulders, back, hips, and knees. Diminished range of motion at the shoulders.  Lymphadenopathy:    He has no cervical adenopathy.  Neurological: He is alert and oriented to person, place, and time. He has normal reflexes. No cranial nerve deficit. Coordination normal.  Skin: No rash noted. No erythema. No pallor.  Psychiatric: He has a normal mood and affect. His behavior is normal. Judgment and thought content normal.     Labs reviewed: No visits with results within 3 Month(s) from this visit. Latest known visit with results is:  Lab on 02/22/2014  Component Date Value Ref Range Status  . Hgb A1c MFr Bld 02/22/2014 6.1* 4.8 - 5.6 % Final   Comment:          Increased risk for diabetes: 5.7 - 6.4                                   Diabetes: >6.4                                   Glycemic control for adults with diabetes: <7.0  . Est. average glucose Bld gHb Est-m* 02/22/2014 128   Final  . Cholesterol, Total 02/22/2014 165  100 - 199 mg/dL Final  . Triglycerides 02/22/2014 181* 0 - 149 mg/dL Final  . HDL 02/22/2014 38* >39 mg/dL Final   Comment: According to ATP-III Guidelines, HDL-C >59 mg/dL is considered a                          negative risk factor for CHD.  Marland Kitchen VLDL Cholesterol Cal 02/22/2014 36  5 - 40 mg/dL Final  . LDL Calculated 02/22/2014 91  0 - 99 mg/dL Final  . Chol/HDL Ratio 02/22/2014 4.3  0.0 - 5.0 ratio units Final   Comment:                                   T. Chol/HDL Ratio                                                                      Men  Women                                                        1/2 Avg.Risk  3.4  3.3                                                            Avg.Risk  5.0    4.4                                                         2X Avg.Risk  9.6    7.1                                                          3X Avg.Risk 23.4   11.0  . Creatinine, Ur 02/22/2014 128.8  22.0 - 328.0 mg/dL Final  . Microalbum.,U,Random 02/22/2014 16.9  0.0 - 17.0 ug/mL Final  . MICROALB/CREAT RATIO 02/22/2014 13.1  0.0 - 30.0 mg/g creat Final  . Glucose 02/22/2014 99  65 - 99 mg/dL Final  . BUN 02/22/2014 13  8 - 27 mg/dL Final  . Creatinine, Ser 02/22/2014 0.94  0.76 - 1.27 mg/dL Final  . GFR calc non Af Amer 02/22/2014 78  >59 mL/min/1.73 Final  . GFR calc Af Amer 02/22/2014 91  >59 mL/min/1.73 Final  . BUN/Creatinine Ratio 02/22/2014 14  10 - 22 Final  . Sodium 02/22/2014 143  134 - 144 mmol/L Final  . Potassium 02/22/2014 4.3  3.5 - 5.2 mmol/L Final  . Chloride 02/22/2014 104  97 - 108 mmol/L Final  . CO2 02/22/2014 23  18 - 29 mmol/L Final  . Calcium 02/22/2014 8.6  8.6 - 10.2 mg/dL Final     Assessment/Plan  1. DM type 2 with diabetic peripheral neuropathy - CMP - Hemoglobin A1C  2. Rheumatoid arthritis with rheumatoid factor - Sedimentation rate - Rheumatoid Factor - Ambulatory referral to Rheumatology  3. Chronic pain syndrome Continue current medications. I do not want to start indomethacin until we know more about his dysphagia.  4. Hyperlipemia - Lipid panel  5. Dysphagia, pharyngoesophageal phase - SLP modified barium swallow; Future

## 2014-06-30 LAB — LIPID PANEL
Chol/HDL Ratio: 4 ratio units (ref 0.0–5.0)
Cholesterol, Total: 175 mg/dL (ref 100–199)
HDL: 44 mg/dL (ref >39)
LDL Calculated: 114 mg/dL — ABNORMAL HIGH (ref 0–99)
Triglycerides: 83 mg/dL (ref 0–149)
VLDL Cholesterol Cal: 17 mg/dL (ref 5–40)

## 2014-06-30 LAB — COMPREHENSIVE METABOLIC PANEL
ALT: 14 IU/L (ref 0–44)
AST: 20 IU/L (ref 0–40)
Albumin/Globulin Ratio: 2.1 (ref 1.1–2.5)
Albumin: 4.1 g/dL (ref 3.5–4.8)
Alkaline Phosphatase: 66 IU/L (ref 39–117)
BUN/Creatinine Ratio: 15 (ref 10–22)
BUN: 16 mg/dL (ref 8–27)
CO2: 23 mmol/L (ref 18–29)
Calcium: 8.3 mg/dL — ABNORMAL LOW (ref 8.6–10.2)
Chloride: 101 mmol/L (ref 97–108)
Creatinine, Ser: 1.06 mg/dL (ref 0.76–1.27)
GFR calc Af Amer: 78 mL/min/{1.73_m2} (ref >59)
GFR calc non Af Amer: 68 mL/min/{1.73_m2} (ref >59)
Globulin, Total: 2 g/dL (ref 1.5–4.5)
Glucose: 115 mg/dL — ABNORMAL HIGH (ref 65–99)
Potassium: 4.2 mmol/L (ref 3.5–5.2)
Sodium: 138 mmol/L (ref 134–144)
Total Bilirubin: 0.4 mg/dL (ref 0.0–1.2)
Total Protein: 6.1 g/dL (ref 6.0–8.5)

## 2014-06-30 LAB — RHEUMATOID FACTOR: Rhuematoid fact SerPl-aCnc: 15.9 IU/mL — ABNORMAL HIGH (ref 0.0–13.9)

## 2014-06-30 LAB — HEMOGLOBIN A1C
Est. average glucose Bld gHb Est-mCnc: 137 mg/dL
Hgb A1c MFr Bld: 6.4 % — ABNORMAL HIGH (ref 4.8–5.6)

## 2014-06-30 LAB — SEDIMENTATION RATE: Sed Rate: 7 mm/hr (ref 0–30)

## 2014-07-05 ENCOUNTER — Other Ambulatory Visit: Payer: Self-pay | Admitting: Internal Medicine

## 2014-07-05 ENCOUNTER — Telehealth: Payer: Self-pay | Admitting: *Deleted

## 2014-07-05 NOTE — Telephone Encounter (Signed)
Patient wife, [name] called wanting to know patient's bloodwork Results. Please Advise.

## 2014-07-09 ENCOUNTER — Other Ambulatory Visit (HOSPITAL_COMMUNITY): Payer: Self-pay | Admitting: Internal Medicine

## 2014-07-09 DIAGNOSIS — R1310 Dysphagia, unspecified: Secondary | ICD-10-CM

## 2014-07-12 ENCOUNTER — Other Ambulatory Visit: Payer: Self-pay | Admitting: *Deleted

## 2014-07-12 MED ORDER — MORPHINE SULFATE 30 MG PO TABS: ORAL_TABLET | ORAL | Status: DC

## 2014-07-12 NOTE — Telephone Encounter (Signed)
Patient wife called and requested and will pick up

## 2014-07-14 ENCOUNTER — Ambulatory Visit (HOSPITAL_COMMUNITY): Payer: Medicare Other

## 2014-07-14 ENCOUNTER — Other Ambulatory Visit (HOSPITAL_COMMUNITY): Payer: Medicare Other

## 2014-07-14 ENCOUNTER — Ambulatory Visit (HOSPITAL_COMMUNITY): Admission: RE | Admit: 2014-07-14 | Payer: Medicare Other | Source: Ambulatory Visit

## 2014-07-26 ENCOUNTER — Ambulatory Visit
Admission: RE | Admit: 2014-07-26 | Discharge: 2014-07-26 | Disposition: A | Discharge status: Home / Self Care | Payer: Medicare Other | Source: Ambulatory Visit | Attending: Rheumatology | Admitting: Rheumatology

## 2014-07-26 ENCOUNTER — Other Ambulatory Visit: Payer: Self-pay | Admitting: Rheumatology

## 2014-07-26 DIAGNOSIS — K5900 Constipation, unspecified: Secondary | ICD-10-CM

## 2014-07-26 DIAGNOSIS — M7918 Myalgia, other site: Secondary | ICD-10-CM

## 2014-07-26 DIAGNOSIS — M545 Low back pain: Secondary | ICD-10-CM | POA: Diagnosis not present

## 2014-07-26 DIAGNOSIS — R1084 Generalized abdominal pain: Secondary | ICD-10-CM

## 2014-07-26 DIAGNOSIS — M7532 Calcific tendinitis of left shoulder: Secondary | ICD-10-CM | POA: Diagnosis not present

## 2014-07-26 DIAGNOSIS — M1611 Unilateral primary osteoarthritis, right hip: Secondary | ICD-10-CM | POA: Diagnosis not present

## 2014-07-26 DIAGNOSIS — R799 Abnormal finding of blood chemistry, unspecified: Secondary | ICD-10-CM | POA: Diagnosis not present

## 2014-07-26 DIAGNOSIS — M25449 Effusion, unspecified hand: Secondary | ICD-10-CM | POA: Diagnosis not present

## 2014-07-26 DIAGNOSIS — M25562 Pain in left knee: Secondary | ICD-10-CM | POA: Diagnosis not present

## 2014-07-26 DIAGNOSIS — R1030 Lower abdominal pain, unspecified: Secondary | ICD-10-CM | POA: Diagnosis not present

## 2014-07-26 DIAGNOSIS — R1031 Right lower quadrant pain: Secondary | ICD-10-CM

## 2014-07-26 DIAGNOSIS — M25551 Pain in right hip: Secondary | ICD-10-CM | POA: Diagnosis not present

## 2014-07-27 DIAGNOSIS — M545 Low back pain: Secondary | ICD-10-CM | POA: Diagnosis not present

## 2014-07-27 DIAGNOSIS — M7532 Calcific tendinitis of left shoulder: Secondary | ICD-10-CM | POA: Diagnosis not present

## 2014-07-27 DIAGNOSIS — Z7952 Long term (current) use of systemic steroids: Secondary | ICD-10-CM | POA: Diagnosis not present

## 2014-07-29 ENCOUNTER — Other Ambulatory Visit: Payer: Self-pay | Admitting: Rheumatology

## 2014-07-29 DIAGNOSIS — M1610 Unilateral primary osteoarthritis, unspecified hip: Secondary | ICD-10-CM

## 2014-07-29 DIAGNOSIS — M25551 Pain in right hip: Secondary | ICD-10-CM

## 2014-08-02 ENCOUNTER — Ambulatory Visit
Admission: RE | Admit: 2014-08-02 | Discharge: 2014-08-02 | Disposition: A | Discharge status: Home / Self Care | Payer: Medicare Other | Source: Ambulatory Visit | Attending: Rheumatology | Admitting: Rheumatology

## 2014-08-02 DIAGNOSIS — M25551 Pain in right hip: Secondary | ICD-10-CM

## 2014-08-02 DIAGNOSIS — M1610 Unilateral primary osteoarthritis, unspecified hip: Secondary | ICD-10-CM

## 2014-08-02 DIAGNOSIS — M1611 Unilateral primary osteoarthritis, right hip: Secondary | ICD-10-CM | POA: Diagnosis not present

## 2014-08-02 MED ORDER — METHYLPREDNISOLONE ACETATE 40 MG/ML INJ SUSP (RADIOLOG
120.0000 mg | Freq: Once | INTRAMUSCULAR | Status: AC
  Administered 2014-08-02: 120 mg via INTRA_ARTICULAR

## 2014-08-02 MED ORDER — IOHEXOL 180 MG/ML  SOLN
1.0000 mL | Freq: Once | INTRAMUSCULAR | Status: AC | PRN
  Administered 2014-08-02: 1 mL via INTRA_ARTICULAR

## 2014-08-18 ENCOUNTER — Other Ambulatory Visit: Payer: Self-pay | Admitting: *Deleted

## 2014-08-18 MED ORDER — MORPHINE SULFATE 30 MG PO TABS: ORAL_TABLET | ORAL | Status: DC

## 2014-08-18 NOTE — Telephone Encounter (Signed)
Patient wife requested and picked up

## 2014-08-19 DIAGNOSIS — E119 Type 2 diabetes mellitus without complications: Secondary | ICD-10-CM | POA: Diagnosis not present

## 2014-08-19 DIAGNOSIS — H5703 Miosis: Secondary | ICD-10-CM | POA: Diagnosis not present

## 2014-08-19 DIAGNOSIS — H04123 Dry eye syndrome of bilateral lacrimal glands: Secondary | ICD-10-CM | POA: Diagnosis not present

## 2014-08-19 DIAGNOSIS — H02834 Dermatochalasis of left upper eyelid: Secondary | ICD-10-CM | POA: Diagnosis not present

## 2014-08-19 DIAGNOSIS — H2513 Age-related nuclear cataract, bilateral: Secondary | ICD-10-CM | POA: Diagnosis not present

## 2014-08-19 DIAGNOSIS — H02831 Dermatochalasis of right upper eyelid: Secondary | ICD-10-CM | POA: Diagnosis not present

## 2014-08-19 LAB — HM DIABETES EYE EXAM

## 2014-08-24 ENCOUNTER — Encounter: Payer: Self-pay | Admitting: *Deleted

## 2014-08-30 DIAGNOSIS — M25449 Effusion, unspecified hand: Secondary | ICD-10-CM | POA: Diagnosis not present

## 2014-08-30 DIAGNOSIS — M25569 Pain in unspecified knee: Secondary | ICD-10-CM | POA: Diagnosis not present

## 2014-08-30 DIAGNOSIS — M130 Polyarthritis, unspecified: Secondary | ICD-10-CM | POA: Diagnosis not present

## 2014-08-30 DIAGNOSIS — R799 Abnormal finding of blood chemistry, unspecified: Secondary | ICD-10-CM | POA: Diagnosis not present

## 2014-08-30 DIAGNOSIS — D696 Thrombocytopenia, unspecified: Secondary | ICD-10-CM | POA: Diagnosis not present

## 2014-09-13 DIAGNOSIS — H2512 Age-related nuclear cataract, left eye: Secondary | ICD-10-CM | POA: Diagnosis not present

## 2014-09-13 DIAGNOSIS — H21562 Pupillary abnormality, left eye: Secondary | ICD-10-CM | POA: Diagnosis not present

## 2014-09-13 DIAGNOSIS — H5703 Miosis: Secondary | ICD-10-CM | POA: Diagnosis not present

## 2014-09-20 ENCOUNTER — Other Ambulatory Visit: Payer: Self-pay | Admitting: *Deleted

## 2014-09-20 DIAGNOSIS — M25512 Pain in left shoulder: Secondary | ICD-10-CM | POA: Diagnosis not present

## 2014-09-20 DIAGNOSIS — R799 Abnormal finding of blood chemistry, unspecified: Secondary | ICD-10-CM | POA: Diagnosis not present

## 2014-09-20 DIAGNOSIS — M79672 Pain in left foot: Secondary | ICD-10-CM | POA: Diagnosis not present

## 2014-09-20 DIAGNOSIS — M79671 Pain in right foot: Secondary | ICD-10-CM | POA: Diagnosis not present

## 2014-09-20 DIAGNOSIS — M25562 Pain in left knee: Secondary | ICD-10-CM | POA: Diagnosis not present

## 2014-09-20 DIAGNOSIS — M25441 Effusion, right hand: Secondary | ICD-10-CM | POA: Diagnosis not present

## 2014-09-20 DIAGNOSIS — M25442 Effusion, left hand: Secondary | ICD-10-CM | POA: Diagnosis not present

## 2014-09-20 MED ORDER — MORPHINE SULFATE 30 MG PO TABS
ORAL_TABLET | ORAL | Status: DC
Start: 2014-09-20 — End: 2014-11-03

## 2014-09-20 NOTE — Telephone Encounter (Signed)
Patient requested and will pick up 

## 2014-09-21 DIAGNOSIS — H2511 Age-related nuclear cataract, right eye: Secondary | ICD-10-CM | POA: Diagnosis not present

## 2014-09-27 DIAGNOSIS — H268 Other specified cataract: Secondary | ICD-10-CM | POA: Diagnosis not present

## 2014-09-27 DIAGNOSIS — H21561 Pupillary abnormality, right eye: Secondary | ICD-10-CM | POA: Diagnosis not present

## 2014-09-27 DIAGNOSIS — H2511 Age-related nuclear cataract, right eye: Secondary | ICD-10-CM | POA: Diagnosis not present

## 2014-10-06 DIAGNOSIS — M25442 Effusion, left hand: Secondary | ICD-10-CM | POA: Diagnosis not present

## 2014-10-06 DIAGNOSIS — M7532 Calcific tendinitis of left shoulder: Secondary | ICD-10-CM | POA: Diagnosis not present

## 2014-10-06 DIAGNOSIS — M25462 Effusion, left knee: Secondary | ICD-10-CM | POA: Diagnosis not present

## 2014-10-06 DIAGNOSIS — M130 Polyarthritis, unspecified: Secondary | ICD-10-CM | POA: Diagnosis not present

## 2014-10-06 DIAGNOSIS — Z79899 Other long term (current) drug therapy: Secondary | ICD-10-CM | POA: Diagnosis not present

## 2014-10-06 DIAGNOSIS — M25441 Effusion, right hand: Secondary | ICD-10-CM | POA: Diagnosis not present

## 2014-10-06 DIAGNOSIS — M7531 Calcific tendinitis of right shoulder: Secondary | ICD-10-CM | POA: Diagnosis not present

## 2014-10-20 DIAGNOSIS — T1502XA Foreign body in cornea, left eye, initial encounter: Secondary | ICD-10-CM | POA: Diagnosis not present

## 2014-11-02 ENCOUNTER — Encounter: Payer: Self-pay | Admitting: Internal Medicine

## 2014-11-02 ENCOUNTER — Ambulatory Visit (INDEPENDENT_AMBULATORY_CARE_PROVIDER_SITE_OTHER): Payer: Medicare Other | Admitting: Internal Medicine

## 2014-11-02 VITALS — BP 138/70 | HR 72 | Temp 98.0°F | Resp 20 | Ht 69.0 in | Wt 232.8 lb

## 2014-11-02 DIAGNOSIS — G629 Polyneuropathy, unspecified: Secondary | ICD-10-CM | POA: Diagnosis not present

## 2014-11-02 DIAGNOSIS — E785 Hyperlipidemia, unspecified: Secondary | ICD-10-CM | POA: Diagnosis not present

## 2014-11-02 DIAGNOSIS — M25561 Pain in right knee: Secondary | ICD-10-CM | POA: Diagnosis not present

## 2014-11-02 DIAGNOSIS — M25562 Pain in left knee: Secondary | ICD-10-CM

## 2014-11-02 DIAGNOSIS — G894 Chronic pain syndrome: Secondary | ICD-10-CM | POA: Diagnosis not present

## 2014-11-02 DIAGNOSIS — E1142 Type 2 diabetes mellitus with diabetic polyneuropathy: Secondary | ICD-10-CM | POA: Diagnosis not present

## 2014-11-02 NOTE — Progress Notes (Signed)
Patient ID: Ronnie Jackson., male   DOB: [birth year], 79 y.o.   MRN: [id number]    Facility  [facility]    Place of Service:   OFFICE    Allergies  Allergen Reactions  . Indocin [Indomethacin]   . Methadone     Chief Complaint  Patient presents with  . Medical Management of Chronic Issues    4 month follow-up    HPI:  Increased pain in the left knee and left shoulder. Can hardly walk on the left knee. Has had injections by Dr. Charlestine Night. Last injection was about a month ago.  Painful to raise the left shoulder. Feels like something is tearing.  Medications: Patient's Medications  New Prescriptions   No medications on file  Previous Medications   LISINOPRIL-HYDROCHLOROTHIAZIDE (PRINZIDE,ZESTORETIC) 20-12.5 MG PER TABLET    Take one tablet once daily to control blood pressure   MELATONIN 3 MG TABS    Take one to two tablets at bedtime to help rest   MORPHINE (MSIR) 30 MG TABLET    Take two tablets by mouth every 12 hours as needed for pain   PREGABALIN (LYRICA) 75 MG CAPSULE    One twice daily to help neuropathic pain in feet   SULFAMETHOXAZOLE-TRIMETHOPRIM (BACTRIM DS,SEPTRA DS) 800-160 MG PER TABLET    TAKE ONE TABLET BY MOUTH TWICE DAILY AS NEEDED FOR INTESTINOVESICAL FISTULA   TRAZODONE (DESYREL) 150 MG TABLET    TAKE 1 TABLET AT BEDTIME FOR REST  Modified Medications   No medications on file  Discontinued Medications   No medications on file     Review of Systems  Constitutional: Positive for fatigue. Negative for fever, activity change and appetite change.  HENT: Positive for hearing loss. Negative for facial swelling, rhinorrhea, sneezing and sore throat.   Eyes: Negative.   Respiratory: Negative for apnea, cough, choking, chest tightness and shortness of breath.   Cardiovascular: Negative for chest pain, palpitations and leg swelling.  Gastrointestinal: Positive for constipation. Negative for nausea, abdominal pain, diarrhea, blood in stool and abdominal distention.    Endocrine: Negative for cold intolerance, heat intolerance, polydipsia, polyphagia and polyuria.       Diabetes mellitus  Genitourinary: Positive for urgency and frequency. Negative for discharge, scrotal swelling, penile pain and testicular pain.  Musculoskeletal: Positive for myalgias, back pain, arthralgias, neck pain and neck stiffness.  Skin: Negative.   Allergic/Immunologic: Negative.   Neurological: Positive for numbness. Negative for dizziness, tremors, seizures, syncope, facial asymmetry, speech difficulty and headaches.  Hematological: Negative for adenopathy. Does not bruise/bleed easily.       Chronic thrombocytopenia.  Psychiatric/Behavioral: Positive for sleep disturbance and dysphoric mood. The patient is nervous/anxious.     Filed Vitals:   11/02/14 1524  BP: 138/70  Pulse: 72  Temp: 98 F (36.7 C)  TempSrc: Oral  Resp: 20  Height: 5\' 9"  (1.753 m)  Weight: 232 lb 12.8 oz (105.597 kg)  SpO2: 94%   Body mass index is 34.36 kg/(m^2).  Physical Exam  Constitutional: He is oriented to person, place, and time. He appears well-developed and well-nourished. No distress.  Overweight  HENT:  Right Ear: External ear normal.  Left Ear: External ear normal.  Nose: Nose normal.  Mouth/Throat: Oropharynx is clear and moist. No oropharyngeal exudate.  Significant partial hearing loss  Eyes: Conjunctivae and EOM are normal. Pupils are equal, round, and reactive to light.  Neck: No JVD present. No tracheal deviation present. No thyromegaly present.  Cardiovascular: Normal rate, regular  rhythm, normal heart sounds and intact distal pulses.  Exam reveals no gallop and no friction rub.   No murmur heard. Pulmonary/Chest: No respiratory distress. He has no wheezes. He has no rales. He exhibits no tenderness.  Abdominal: He exhibits no distension and no mass. There is no tenderness.  Musculoskeletal: He exhibits edema and tenderness.  Multiple painful areas including the neck,  shoulders, back, hips, and knees. Diminished range of motion at the shoulders.  Lymphadenopathy:    He has no cervical adenopathy.  Neurological: He is alert and oriented to person, place, and time. He has normal reflexes. No cranial nerve deficit. Coordination normal.  Skin: No rash noted. No erythema. No pallor.  Psychiatric: He has a normal mood and affect. His behavior is normal. Judgment and thought content normal.     Labs reviewed: Abstract on 08/24/2014  Component Date Value Ref Range Status  . HM Diabetic Eye Exam 08/19/2014 No Retinopathy  No Retinopathy Final   [facility] #[phone]     Assessment/Plan  1. Chronic pain syndrome Continue current medications  2. Arthralgia of both knees Injected the left knee medially with 2 cc 1% Lidocaine and 1cc Kenalog 40. Minimal bleeding at site of infection. Improved after the injection. Was able to walk.

## 2014-11-03 ENCOUNTER — Other Ambulatory Visit: Payer: Self-pay

## 2014-11-03 MED ORDER — MORPHINE SULFATE 30 MG PO TABS: ORAL_TABLET | ORAL | Status: DC

## 2014-11-24 DIAGNOSIS — M545 Low back pain: Secondary | ICD-10-CM | POA: Diagnosis not present

## 2014-11-24 DIAGNOSIS — Z79899 Other long term (current) drug therapy: Secondary | ICD-10-CM | POA: Diagnosis not present

## 2014-11-24 DIAGNOSIS — M25441 Effusion, right hand: Secondary | ICD-10-CM | POA: Diagnosis not present

## 2014-11-24 DIAGNOSIS — M0570 Rheumatoid arthritis with rheumatoid factor of unspecified site without organ or systems involvement: Secondary | ICD-10-CM | POA: Diagnosis not present

## 2014-11-24 DIAGNOSIS — M25552 Pain in left hip: Secondary | ICD-10-CM | POA: Diagnosis not present

## 2014-11-24 DIAGNOSIS — M25462 Effusion, left knee: Secondary | ICD-10-CM | POA: Diagnosis not present

## 2014-11-24 DIAGNOSIS — M25442 Effusion, left hand: Secondary | ICD-10-CM | POA: Diagnosis not present

## 2014-12-03 ENCOUNTER — Other Ambulatory Visit: Payer: Self-pay | Admitting: *Deleted

## 2014-12-03 MED ORDER — MORPHINE SULFATE 30 MG PO TABS: ORAL_TABLET | ORAL | Status: DC

## 2014-12-03 NOTE — Telephone Encounter (Signed)
Patient wife Requested and will pick up

## 2014-12-22 ENCOUNTER — Encounter: Payer: Self-pay | Admitting: Internal Medicine

## 2014-12-22 ENCOUNTER — Ambulatory Visit (INDEPENDENT_AMBULATORY_CARE_PROVIDER_SITE_OTHER): Payer: Medicare Other | Admitting: Internal Medicine

## 2014-12-22 VITALS — BP 112/82 | HR 81 | Temp 97.9°F | Resp 20 | Ht 69.0 in | Wt 220.8 lb

## 2014-12-22 DIAGNOSIS — M25561 Pain in right knee: Secondary | ICD-10-CM | POA: Diagnosis not present

## 2014-12-22 DIAGNOSIS — E1142 Type 2 diabetes mellitus with diabetic polyneuropathy: Secondary | ICD-10-CM | POA: Diagnosis not present

## 2014-12-22 DIAGNOSIS — M059 Rheumatoid arthritis with rheumatoid factor, unspecified: Secondary | ICD-10-CM | POA: Diagnosis not present

## 2014-12-22 DIAGNOSIS — G629 Polyneuropathy, unspecified: Secondary | ICD-10-CM | POA: Diagnosis not present

## 2014-12-22 DIAGNOSIS — R1314 Dysphagia, pharyngoesophageal phase: Secondary | ICD-10-CM | POA: Diagnosis not present

## 2014-12-22 DIAGNOSIS — M25562 Pain in left knee: Secondary | ICD-10-CM

## 2014-12-22 DIAGNOSIS — S2239XA Fracture of one rib, unspecified side, initial encounter for closed fracture: Secondary | ICD-10-CM | POA: Insufficient documentation

## 2014-12-22 DIAGNOSIS — E785 Hyperlipidemia, unspecified: Secondary | ICD-10-CM

## 2014-12-22 DIAGNOSIS — S2232XA Fracture of one rib, left side, initial encounter for closed fracture: Secondary | ICD-10-CM

## 2014-12-22 DIAGNOSIS — G894 Chronic pain syndrome: Secondary | ICD-10-CM | POA: Diagnosis not present

## 2014-12-22 MED ORDER — SULFAMETHOXAZOLE-TRIMETHOPRIM 800-160 MG PO TABS: ORAL_TABLET | ORAL | Status: DC

## 2014-12-22 NOTE — Progress Notes (Signed)
Patient ID: Ronnie Jackson., male   DOB: 15-Sep-1935, 79 y.o.   MRN: [id number]    HISTORY AND PHYSICAL  Location:    [facility]   Place of Service:   OFFICE  Extended Emergency Contact Information Primary Emergency Contact: Becker,Jean Address: [address]          [place], [place] [address] [place] Phone: [phone] Relation: Spouse  Advanced Directive information Does patient have an advance directive?: No, Would patient like information on creating an advanced directive?: No - patient declined information  Chief Complaint  Patient presents with  . Acute Visit    fell on  Saturday, left side pain,   . Medical Management of Chronic Issues    break out sweats, profusely    HPI:  Rib fracture, left, closed, initial encounter: Exquisitely tender left side laterally following a fall  DM type 2 with diabetic peripheral neuropathy -controlled  Hyperlipemia - controlled  Rheumatoid arthritis with rheumatoid factor: continues with diffuse arthralgias  Dysphagia, pharyngoesophageal phase: improved. Denies cough at meals or discomfort with swallowing.  Arthralgia of both knees: complains of pain that makes it difficult to walk sometimes  Chronic pain syndrome: multiple joints and muscles  Diabetic polyneuropathy associated with type 2 diabetes mellitus: continues to have numb feelings in feet    Past Medical History  Diagnosis Date  . Unspecified arthropathy, pelvic region and thigh   . Herpes simplex disease   . Pain in joint, lower leg     Bilateral knee pains  . Intestinovesical fistula   . Thrombocytopenia, unspecified   . Anxiety state, unspecified   . Depressive disorder, not elsewhere classified   . Chronic pain syndrome   . Hypopotassemia   . Insomnia, unspecified   . Dysphagia, unspecified(787.20)   . Spinal stenosis, unspecified region other than cervical   . Type II or unspecified type diabetes mellitus without mention of complication,  not stated as uncontrolled   . Reflux esophagitis   . Hyperlipidemia   . Constipation   . Unspecified hereditary and idiopathic peripheral neuropathy   . Hypertension   . Myalgia and myositis, unspecified   . Malignant neoplasm of prostate   . Rheumatoid arthritis with rheumatoid factor   . Osteoarthritis     Past Surgical History  Procedure Laterality Date  . Prostate surgery  1991  . Nasal sinus surgery  1992  . Cardiac catheterization  01/31/2004    Dr Gwenlyn Found  . Colonoscopy  08/09/2006    Dr Christian Mate, hemorrhoids/rectal fistula  . Cystocopy  07/2006    Dr [name]    Patient Care Team: Estill Dooms, MD as PCP - General (Internal Medicine) Tomasita Morrow, NP as Nurse Practitioner (Cardiology) Deneise Lever, MD as Consulting Physician (Pulmonary Disease) Kristeen Miss, MD as Consulting Physician (Neurosurgery) Ronny Bacon, MD as Referring Physician (Surgery) Milana Na, MD (General Surgery) Bo Merino, MD as Consulting Physician (Rheumatology)  History   Social History  . Marital Status: Married    Spouse Name: N/A  . Number of Children: N/A  . Years of Education: N/A   Occupational History  . Not on file.   Social History Main Topics  . Smoking status: Never Smoker   . Smokeless tobacco: Not on file     Comment: Chews tobacco daily   . Alcohol Use: No  . Drug Use: No  . Sexual Activity: Not on file   Other Topics Concern  . Not on file  Social History Narrative     reports that he has never smoked. He does not have any smokeless tobacco history on file. He reports that he does not drink alcohol or use illicit drugs.  Family History  Problem Relation Age of Onset  . Prostate cancer Father   . Heart failure Mother   . Arthritis Sister   . Lung cancer Daughter    Family Status  Relation Status Death Age  . Father Deceased   . Mother Deceased   . Brother Alive   . Sister Alive   . Sister Alive   . Daughter Deceased     . Son Alive   . Daughter Alive      There is no immunization history on file for this patient.  Allergies  Allergen Reactions  . Indocin [Indomethacin]   . Methadone     Medications: Patient's Medications  New Prescriptions   No medications on file  Previous Medications   LISINOPRIL-HYDROCHLOROTHIAZIDE (PRINZIDE,ZESTORETIC) 20-12.5 MG PER TABLET    Take one tablet once daily to control blood pressure   MELATONIN 3 MG TABS    Take one to two tablets at bedtime to help rest   MORPHINE (MSIR) 30 MG TABLET    Take two tablets by mouth every 12 hours as needed for pain   PREGABALIN (LYRICA) 75 MG CAPSULE    One twice daily to help neuropathic pain in feet  Modified Medications   Modified Medication Previous Medication   SULFAMETHOXAZOLE-TRIMETHOPRIM (BACTRIM DS,SEPTRA DS) 800-160 MG PER TABLET sulfamethoxazole-trimethoprim (BACTRIM DS,SEPTRA DS) 800-160 MG per tablet      One twice daily to control infection    TAKE ONE TABLET BY MOUTH TWICE DAILY AS NEEDED FOR INTESTINOVESICAL FISTULA  Discontinued Medications   TRAZODONE (DESYREL) 150 MG TABLET    TAKE 1 TABLET AT BEDTIME FOR REST    Review of Systems  Constitutional: Positive for fatigue. Negative for fever, activity change and appetite change.  HENT: Positive for hearing loss. Negative for facial swelling, rhinorrhea, sneezing and sore throat.   Eyes: Negative.   Respiratory: Negative for apnea, cough, choking, chest tightness and shortness of breath.   Cardiovascular: Negative for chest pain, palpitations and leg swelling.  Gastrointestinal: Positive for constipation. Negative for nausea, abdominal pain, diarrhea, blood in stool and abdominal distention.  Endocrine: Negative for cold intolerance, heat intolerance, polydipsia, polyphagia and polyuria.       Diabetes mellitus  Genitourinary: Positive for urgency and frequency. Negative for discharge, scrotal swelling, penile pain and testicular pain.  Musculoskeletal: Positive  for myalgias, back pain, arthralgias, neck pain and neck stiffness.  Skin: Negative.   Allergic/Immunologic: Negative.   Neurological: Positive for numbness. Negative for dizziness, tremors, seizures, syncope, facial asymmetry, speech difficulty and headaches.  Hematological: Negative for adenopathy. Does not bruise/bleed easily.       Chronic thrombocytopenia.  Psychiatric/Behavioral: Positive for sleep disturbance and dysphoric mood. The patient is nervous/anxious.     Filed Vitals:   12/22/14 1313  BP: 112/82  Pulse: 81  Temp: 97.9 F (36.6 C)  TempSrc: Oral  Resp: 20  Height: 5\' 9"  (1.753 m)  Weight: 220 lb 12.8 oz (100.154 kg)  SpO2: 98%   Body mass index is 32.59 kg/(m^2).  Physical Exam  Constitutional: He is oriented to person, place, and time. He appears well-developed and well-nourished. No distress.  Overweight  HENT:  Right Ear: External ear normal.  Left Ear: External ear normal.  Nose: Nose normal.  Mouth/Throat: Oropharynx is  clear and moist. No oropharyngeal exudate.  Significant partial hearing loss  Eyes: Conjunctivae and EOM are normal. Pupils are equal, round, and reactive to light.  Neck: No JVD present. No tracheal deviation present. No thyromegaly present.  Cardiovascular: Normal rate, regular rhythm, normal heart sounds and intact distal pulses.  Exam reveals no gallop and no friction rub.   No murmur heard. Pulmonary/Chest: No respiratory distress. He has no wheezes. He has no rales. He exhibits tenderness (left chest laterally).  Abdominal: He exhibits no distension and no mass. There is no tenderness.  Musculoskeletal: He exhibits edema and tenderness.  Multiple painful areas including the neck, shoulders, back, hips, and knees. Diminished range of motion at the shoulders.  Lymphadenopathy:    He has no cervical adenopathy.  Neurological: He is alert and oriented to person, place, and time. He has normal reflexes. No cranial nerve deficit.  Coordination normal.  Decreased sensation to Vibration and monofilament  Skin: No rash noted. No erythema. No pallor.  Psychiatric: He has a normal mood and affect. His behavior is normal. Judgment and thought content normal.     Labs reviewed: No visits with results within 3 Month(s) from this visit. Latest known visit with results is:  Abstract on 08/24/2014  Component Date Value Ref Range Status  . HM Diabetic Eye Exam 08/19/2014 No Retinopathy  No Retinopathy Final   [facility] [phone]    No results found.   Assessment/Plan 1. DM type 2 with diabetic peripheral neuropathy controlled - Hemoglobin Q8G - Basic metabolic panel - CBC with Differential; Future - CBC with Differential  2. Hyperlipemia controlled - Lipid panel  3. Rib fracture, left, closed, initial encounter Continue current pain medications. He declines referral to radiology.  4. Rheumatoid arthritis with rheumatoid factor Likely cause of at least some of the pains he endures. Does not want to return to rheumatologist yet. Says he can't afford more medication.  5. Dysphagia, pharyngoesophageal phase Improved.  6. Arthralgia of both knees Increasing discomfort  7. Chronic pain syndrome Continue narcotics  8. Diabetic polyneuropathy associated with type 2 diabetes mellitus unchanged

## 2014-12-23 LAB — CBC WITH DIFFERENTIAL/PLATELET
Basophils Absolute: 0 10*3/uL (ref 0.0–0.2)
Basos: 0 %
EOS (ABSOLUTE): 0.1 10*3/uL (ref 0.0–0.4)
Eos: 1 %
Hematocrit: 41.9 % (ref 37.5–51.0)
Hemoglobin: 13.6 g/dL (ref 12.6–17.7)
Immature Grans (Abs): 0 10*3/uL (ref 0.0–0.1)
Immature Granulocytes: 0 %
Lymphocytes Absolute: 2.4 10*3/uL (ref 0.7–3.1)
Lymphs: 29 %
MCH: 30.1 pg (ref 26.6–33.0)
MCHC: 32.5 g/dL (ref 31.5–35.7)
MCV: 93 fL (ref 79–97)
Monocytes Absolute: 0.6 10*3/uL (ref 0.1–0.9)
Monocytes: 7 %
Neutrophils Absolute: 5.2 10*3/uL (ref 1.4–7.0)
Neutrophils: 63 %
Platelets: 154 10*3/uL (ref 150–379)
RBC: 4.52 x10E6/uL (ref 4.14–5.80)
RDW: 14.5 % (ref 12.3–15.4)
WBC: 8.2 10*3/uL (ref 3.4–10.8)

## 2014-12-23 LAB — LIPID PANEL
Chol/HDL Ratio: 3.6 ratio units (ref 0.0–5.0)
Cholesterol, Total: 190 mg/dL (ref 100–199)
HDL: 53 mg/dL (ref >39)
LDL Calculated: 117 mg/dL — ABNORMAL HIGH (ref 0–99)
Triglycerides: 99 mg/dL (ref 0–149)
VLDL Cholesterol Cal: 20 mg/dL (ref 5–40)

## 2014-12-23 LAB — BASIC METABOLIC PANEL
BUN/Creatinine Ratio: 16 (ref 10–22)
BUN: 24 mg/dL (ref 8–27)
CO2: 23 mmol/L (ref 18–29)
Calcium: 9 mg/dL (ref 8.6–10.2)
Chloride: 104 mmol/L (ref 97–108)
Creatinine, Ser: 1.51 mg/dL — ABNORMAL HIGH (ref 0.76–1.27)
GFR calc Af Amer: 51 mL/min/{1.73_m2} — ABNORMAL LOW (ref >59)
GFR calc non Af Amer: 44 mL/min/{1.73_m2} — ABNORMAL LOW (ref >59)
Glucose: 123 mg/dL — ABNORMAL HIGH (ref 65–99)
Potassium: 4.6 mmol/L (ref 3.5–5.2)
Sodium: 141 mmol/L (ref 134–144)

## 2014-12-23 LAB — HEMOGLOBIN A1C
Est. average glucose Bld gHb Est-mCnc: 134 mg/dL
Hgb A1c MFr Bld: 6.3 % — ABNORMAL HIGH (ref 4.8–5.6)

## 2014-12-27 ENCOUNTER — Telehealth: Payer: Self-pay | Admitting: *Deleted

## 2014-12-27 NOTE — Telephone Encounter (Signed)
Patient wife notified and agreed to Dr Rolly Salter finding.

## 2014-12-27 NOTE — Telephone Encounter (Signed)
Call patient. Complete blood count was normal. Hemoglobin A1c, which reflects glucose control, was quite satisfactory at 6.3. Blood sugar remains mildly elevated at 123 mg percent area of there is mild deterioration in kidney function. At this point, nothing more needs to be done about that. Lipid panel is satisfactory.

## 2014-12-27 NOTE — Telephone Encounter (Signed)
Patient wife, [name] called wanting his bloodwork results. Please Advise.

## 2015-01-07 ENCOUNTER — Other Ambulatory Visit: Payer: Self-pay | Admitting: *Deleted

## 2015-01-07 MED ORDER — MORPHINE SULFATE 30 MG PO TABS: ORAL_TABLET | ORAL | Status: DC

## 2015-01-07 NOTE — Telephone Encounter (Signed)
Patient requested and will pick up 

## 2015-02-23 ENCOUNTER — Encounter: Payer: Self-pay | Admitting: Internal Medicine

## 2015-03-03 ENCOUNTER — Other Ambulatory Visit: Payer: Self-pay | Admitting: *Deleted

## 2015-03-03 MED ORDER — MORPHINE SULFATE 30 MG PO TABS: ORAL_TABLET | ORAL | Status: DC

## 2015-03-03 NOTE — Telephone Encounter (Signed)
Patient wife requested and will pick up 

## 2015-03-04 ENCOUNTER — Other Ambulatory Visit: Payer: No Typology Code available for payment source

## 2015-03-07 ENCOUNTER — Other Ambulatory Visit: Payer: No Typology Code available for payment source

## 2015-03-09 ENCOUNTER — Ambulatory Visit: Payer: No Typology Code available for payment source | Admitting: Internal Medicine

## 2015-03-17 IMAGING — XA DG FLUORO GUIDE NDL PLC/BX
1 series · 1 of 1 positions shown · IV contrast (omnipaque)
Comparison: 11/07/2012

CLINICAL DATA: Osteoarthritis of the right hip with pain. Good
response to previous injections.

EXAM:
Fluoroscopically guided right hip injection
TECHNIQUE: The overlying skin was prepped with Betadine, draped in the usual
sterile fashion, and infiltrated locally with 1% Lidocaine. A 22
gauge spinal needle was advanced under fluoroscopic observation to
the lateral femoral neck. Confirmatory injection of less than 1 ml
of Omnipaque 180 demonstrates intra-articular spread without
intravascular component.
120 mg Depo-Medrol and 3 ml 1% lidocaine were then instilled. The
procedure was well tolerated. The patient was observed for a short
period of time then discharged in good condition.
FLUOROSCOPY TIME:  0 min 21 seconds.  62.9 micro gray m squared

[Series 2: ortho standard · 1 of 1 slices shown]
[im 1/1]
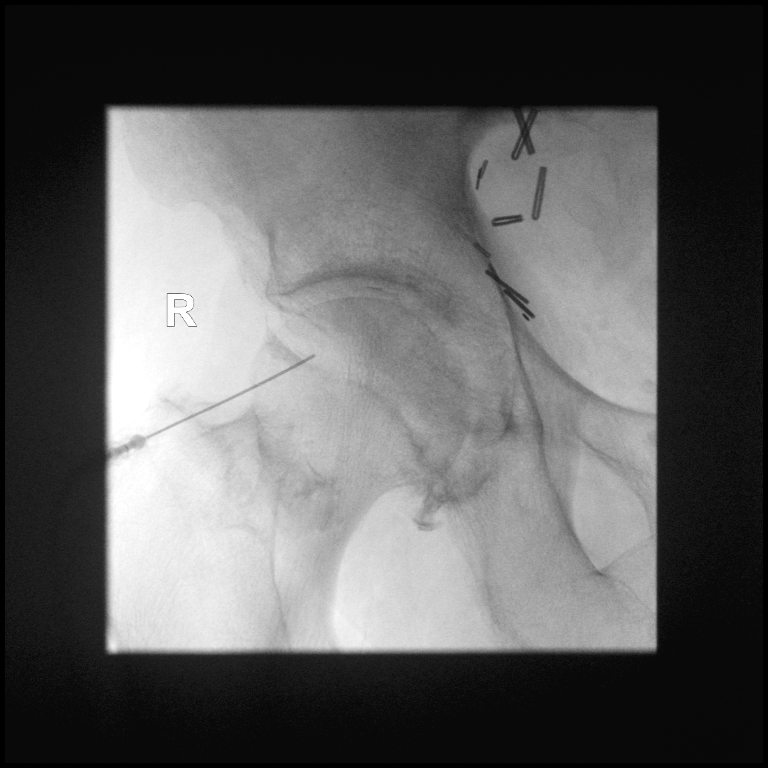

[1 of 1 positions shown; findings below may reference images not displayed]

IMPRESSION: Technically successful right hip injection under fluoroscopy.

## 2015-03-24 ENCOUNTER — Other Ambulatory Visit: Payer: Self-pay | Admitting: *Deleted

## 2015-03-24 DIAGNOSIS — E1142 Type 2 diabetes mellitus with diabetic polyneuropathy: Secondary | ICD-10-CM

## 2015-03-24 DIAGNOSIS — E785 Hyperlipidemia, unspecified: Secondary | ICD-10-CM

## 2015-03-25 ENCOUNTER — Other Ambulatory Visit: Payer: Medicare Other

## 2015-03-25 DIAGNOSIS — G629 Polyneuropathy, unspecified: Secondary | ICD-10-CM | POA: Diagnosis not present

## 2015-03-25 DIAGNOSIS — E1142 Type 2 diabetes mellitus with diabetic polyneuropathy: Secondary | ICD-10-CM | POA: Diagnosis not present

## 2015-03-25 DIAGNOSIS — E785 Hyperlipidemia, unspecified: Secondary | ICD-10-CM

## 2015-03-26 LAB — BASIC METABOLIC PANEL
BUN/Creatinine Ratio: 12 (ref 10–22)
BUN: 15 mg/dL (ref 8–27)
CO2: 24 mmol/L (ref 18–29)
Calcium: 9 mg/dL (ref 8.6–10.2)
Chloride: 104 mmol/L (ref 97–108)
Creatinine, Ser: 1.24 mg/dL (ref 0.76–1.27)
GFR calc Af Amer: 64 mL/min/{1.73_m2} (ref >59)
GFR calc non Af Amer: 56 mL/min/{1.73_m2} — ABNORMAL LOW (ref >59)
Glucose: 89 mg/dL (ref 65–99)
Potassium: 4.8 mmol/L (ref 3.5–5.2)
Sodium: 141 mmol/L (ref 134–144)

## 2015-03-26 LAB — LIPID PANEL
Chol/HDL Ratio: 4.7 ratio units (ref 0.0–5.0)
Cholesterol, Total: 151 mg/dL (ref 100–199)
HDL: 32 mg/dL — ABNORMAL LOW (ref >39)
LDL Calculated: 93 mg/dL (ref 0–99)
Triglycerides: 132 mg/dL (ref 0–149)
VLDL Cholesterol Cal: 26 mg/dL (ref 5–40)

## 2015-03-26 LAB — CBC WITH DIFFERENTIAL/PLATELET
Basophils Absolute: 0 10*3/uL (ref 0.0–0.2)
Basos: 0 %
EOS (ABSOLUTE): 0.1 10*3/uL (ref 0.0–0.4)
Eos: 2 %
Hematocrit: 38.5 % (ref 37.5–51.0)
Hemoglobin: 12.7 g/dL (ref 12.6–17.7)
Immature Grans (Abs): 0 10*3/uL (ref 0.0–0.1)
Immature Granulocytes: 0 %
Lymphocytes Absolute: 2.6 10*3/uL (ref 0.7–3.1)
Lymphs: 37 %
MCH: 30.4 pg (ref 26.6–33.0)
MCHC: 33 g/dL (ref 31.5–35.7)
MCV: 92 fL (ref 79–97)
Monocytes Absolute: 0.6 10*3/uL (ref 0.1–0.9)
Monocytes: 8 %
Neutrophils Absolute: 3.7 10*3/uL (ref 1.4–7.0)
Neutrophils: 53 %
Platelets: 154 10*3/uL (ref 150–379)
RBC: 4.18 x10E6/uL (ref 4.14–5.80)
RDW: 14.3 % (ref 12.3–15.4)
WBC: 7 10*3/uL (ref 3.4–10.8)

## 2015-03-26 LAB — HEMOGLOBIN A1C
Est. average glucose Bld gHb Est-mCnc: 140 mg/dL
Hgb A1c MFr Bld: 6.5 % — ABNORMAL HIGH (ref 4.8–5.6)

## 2015-03-30 ENCOUNTER — Ambulatory Visit (INDEPENDENT_AMBULATORY_CARE_PROVIDER_SITE_OTHER): Payer: Medicare Other | Admitting: Internal Medicine

## 2015-03-30 ENCOUNTER — Other Ambulatory Visit: Payer: Medicare Other

## 2015-03-30 ENCOUNTER — Encounter: Payer: Self-pay | Admitting: Internal Medicine

## 2015-03-30 VITALS — BP 150/92 | HR 68 | Temp 98.1°F | Ht 69.0 in | Wt 228.6 lb

## 2015-03-30 DIAGNOSIS — M25561 Pain in right knee: Secondary | ICD-10-CM

## 2015-03-30 DIAGNOSIS — E1142 Type 2 diabetes mellitus with diabetic polyneuropathy: Secondary | ICD-10-CM | POA: Diagnosis not present

## 2015-03-30 DIAGNOSIS — S2232XA Fracture of one rib, left side, initial encounter for closed fracture: Secondary | ICD-10-CM | POA: Diagnosis not present

## 2015-03-30 DIAGNOSIS — G629 Polyneuropathy, unspecified: Secondary | ICD-10-CM | POA: Diagnosis not present

## 2015-03-30 DIAGNOSIS — M25562 Pain in left knee: Secondary | ICD-10-CM

## 2015-03-30 DIAGNOSIS — M25551 Pain in right hip: Secondary | ICD-10-CM | POA: Diagnosis not present

## 2015-03-30 DIAGNOSIS — M059 Rheumatoid arthritis with rheumatoid factor, unspecified: Secondary | ICD-10-CM

## 2015-03-30 DIAGNOSIS — E785 Hyperlipidemia, unspecified: Secondary | ICD-10-CM

## 2015-03-30 DIAGNOSIS — R1314 Dysphagia, pharyngoesophageal phase: Secondary | ICD-10-CM | POA: Diagnosis not present

## 2015-03-30 MED ORDER — MORPHINE SULFATE 30 MG PO TABS: ORAL_TABLET | ORAL | Status: DC

## 2015-03-30 NOTE — Progress Notes (Signed)
Patient ID: Ronnie Colder., male   DOB: 13-Apr-1936, 79 y.o.   MRN: [id number]    Facility  [facility]    Place of Service:   OFFICE    Allergies  Allergen Reactions  . Indocin [Indomethacin]   . Methadone     Chief Complaint  Patient presents with  . Medical Management of Chronic Issues    Medical Management of Chronic Issues. 4 Month Follow up    HPI:  Rib fracture, left, closed, initial encounter - pain has resolved  Rheumatoid arthritis with rheumatoid factor - painful areas possibly related to rheumatoid arthritis.  Pain in joint, pelvic region and thigh, right - right groin discomfort thought related to degenerative disease of the right hip joint  DM type 2 with diabetic peripheral neuropathy -space well controlled  Arthralgia of both knees - painful when walking  Hyperlipemia - well controlled  Dysphagia, pharyngoesophageal phase - increased discomfort with swallowing as well as food hanging in the throat. Patient willing to consider GI referral.    Medications: Patient's Medications  New Prescriptions   No medications on file  Previous Medications   LISINOPRIL-HYDROCHLOROTHIAZIDE (PRINZIDE,ZESTORETIC) 20-12.5 MG PER TABLET    Take one tablet once daily to control blood pressure   MELATONIN 3 MG TABS    Take one to two tablets at bedtime to help rest   MORPHINE (MSIR) 30 MG TABLET    Take two tablets by mouth every 12 hours as needed for pain   PREGABALIN (LYRICA) 75 MG CAPSULE    One twice daily to help neuropathic pain in feet   SULFAMETHOXAZOLE-TRIMETHOPRIM (BACTRIM DS,SEPTRA DS) 800-160 MG PER TABLET    One twice daily to control infection  Modified Medications   No medications on file  Discontinued Medications   No medications on file     Review of Systems  Constitutional: Positive for fatigue. Negative for fever, activity change and appetite change.  HENT: Positive for hearing loss. Negative for facial swelling, rhinorrhea, sneezing and sore throat.     Eyes: Negative.   Respiratory: Negative for apnea, cough, choking, chest tightness and shortness of breath.   Cardiovascular: Negative for chest pain, palpitations and leg swelling.  Gastrointestinal: Positive for constipation. Negative for nausea, abdominal pain, diarrhea, blood in stool and abdominal distention.       Dysphagia with food sticking and mild odynophagia.  Endocrine: Negative for cold intolerance, heat intolerance, polydipsia, polyphagia and polyuria.       Diabetes mellitus  Genitourinary: Positive for urgency and frequency. Negative for discharge, scrotal swelling, penile pain and testicular pain.  Musculoskeletal: Positive for myalgias, back pain, arthralgias, neck pain and neck stiffness.  Skin: Negative.   Allergic/Immunologic: Negative.   Neurological: Positive for numbness. Negative for dizziness, tremors, seizures, syncope, facial asymmetry, speech difficulty and headaches.  Hematological: Negative for adenopathy. Does not bruise/bleed easily.       Chronic thrombocytopenia.  Psychiatric/Behavioral: Positive for sleep disturbance and dysphoric mood. The patient is nervous/anxious.     Filed Vitals:   03/30/15 1356  BP: 150/92  Pulse: 68  Temp: 98.1 F (36.7 C)  TempSrc: Oral  Height: 5\' 9"  (1.753 m)  Weight: 228 lb 9.6 oz (103.692 kg)   Body mass index is 33.74 kg/(m^2).  Physical Exam  Constitutional: He is oriented to person, place, and time. He appears well-developed and well-nourished. No distress.  Overweight  HENT:  Right Ear: External ear normal.  Left Ear: External ear normal.  Nose: Nose normal.  Mouth/Throat: Oropharynx is clear and moist. No oropharyngeal exudate.  Significant partial hearing loss  Eyes: Conjunctivae and EOM are normal. Pupils are equal, round, and reactive to light.  Neck: No JVD present. No tracheal deviation present. No thyromegaly present.  Cardiovascular: Normal rate, regular rhythm, normal heart sounds and intact  distal pulses.  Exam reveals no gallop and no friction rub.   No murmur heard. Pulmonary/Chest: No respiratory distress. He has no wheezes. He has no rales. He exhibits no tenderness.  Abdominal: He exhibits no distension and no mass. There is no tenderness.  Musculoskeletal: He exhibits edema and tenderness.  Multiple painful areas including the neck, shoulders, back, hips, and knees. Diminished range of motion at the shoulders.  Lymphadenopathy:    He has no cervical adenopathy.  Neurological: He is alert and oriented to person, place, and time. He has normal reflexes. No cranial nerve deficit. Coordination normal.  Decreased sensation to Vibration and monofilament  Skin: No rash noted. No erythema. No pallor.  Psychiatric: He has a normal mood and affect. His behavior is normal. Judgment and thought content normal.     Labs reviewed: Lab Summary Latest Ref Rng 03/25/2015 12/22/2014 06/29/2014  Hemoglobin 12.6 - 17.7 g/dL 12.7 13.6 (None)  Hematocrit 37.5 - 51.0 % 38.5 41.9 (None)  White count 3.4 - 10.8 x10E3/uL 7.0 8.2 (None)  Platelet count 150 - 379 x10E3/uL 154 154 (None)  Sodium 134 - 144 mmol/L 141 141 138  Potassium 3.5 - 5.2 mmol/L 4.8 4.6 4.2  Calcium 8.6 - 10.2 mg/dL 9.0 9.0 8.3(L)  Phosphorus - (None) (None) (None)  Creatinine 0.76 - 1.27 mg/dL 1.24 1.51(H) 1.06  AST 0 - 40 IU/L (None) (None) 20  Alk Phos 39 - 117 IU/L (None) (None) 66  Bilirubin 0.0 - 1.2 mg/dL (None) (None) 0.4  Glucose 65 - 99 mg/dL 89 123(H) 115(H)  Cholesterol - (None) (None) (None)  HDL cholesterol >39 mg/dL 32(L) 53 44  Triglycerides 0 - 149 mg/dL 132 99 83  LDL Direct - (None) (None) (None)  LDL Calc 0 - 99 mg/dL 93 117(H) 114(H)  Total protein - (None) (None) (None)  Albumin 3.5 - 4.8 g/dL (None) (None) 4.1   No results found for: TSH, T3TOTAL, T4TOTAL, THYROIDAB Lab Results  Component Value Date   BUN 15 03/25/2015   Lab Results  Component Value Date   HGBA1C 6.5* 03/25/2015        Assessment/Plan  1. Rib fracture, left, closed, initial encounter Resolved  2. Rheumatoid arthritis with rheumatoid factor Continue current medications  3. Pain in joint, pelvic region and thigh, right Consider use of a cane to split the weight between the right leg and left arm  4. DM type 2 with diabetic peripheral neuropathy Continue current treatment - Hemoglobin A1c; Future - Comprehensive metabolic panel; Future  5. Arthralgia of both knees - morphine (MSIR) 30 MG tablet; Take two tablets by mouth every 12 hours as needed for pain  Dispense: 120 tablet; Refill: 0  6. Hyperlipemia - Lipid panel; Future  7. Dysphagia, pharyngoesophageal phase - Ambulatory referral to Gastroenterology

## 2015-05-03 ENCOUNTER — Other Ambulatory Visit: Payer: Self-pay | Admitting: *Deleted

## 2015-05-03 DIAGNOSIS — M25561 Pain in right knee: Secondary | ICD-10-CM

## 2015-05-03 DIAGNOSIS — M25562 Pain in left knee: Principal | ICD-10-CM

## 2015-05-03 MED ORDER — MORPHINE SULFATE 30 MG PO TABS: ORAL_TABLET | ORAL | Status: DC

## 2015-05-03 NOTE — Telephone Encounter (Signed)
Patient wife requested and will pick up 

## 2015-05-20 ENCOUNTER — Ambulatory Visit: Payer: No Typology Code available for payment source | Admitting: Gastroenterology

## 2015-06-03 ENCOUNTER — Telehealth: Payer: Self-pay

## 2015-06-03 ENCOUNTER — Ambulatory Visit
Admission: RE | Admit: 2015-06-03 | Discharge: 2015-06-03 | Disposition: A | Discharge status: Home / Self Care | Payer: Medicare Other | Source: Ambulatory Visit | Attending: Internal Medicine | Admitting: Internal Medicine

## 2015-06-03 ENCOUNTER — Ambulatory Visit (INDEPENDENT_AMBULATORY_CARE_PROVIDER_SITE_OTHER): Payer: Medicare Other | Admitting: Internal Medicine

## 2015-06-03 ENCOUNTER — Encounter: Payer: Self-pay | Admitting: Internal Medicine

## 2015-06-03 VITALS — BP 130/70 | HR 121 | Temp 100.0°F | Resp 20 | Ht 69.0 in | Wt 226.8 lb

## 2015-06-03 DIAGNOSIS — R05 Cough: Secondary | ICD-10-CM

## 2015-06-03 DIAGNOSIS — R059 Cough, unspecified: Secondary | ICD-10-CM

## 2015-06-03 DIAGNOSIS — K1232 Oral mucositis (ulcerative) due to other drugs: Secondary | ICD-10-CM

## 2015-06-03 DIAGNOSIS — M059 Rheumatoid arthritis with rheumatoid factor, unspecified: Secondary | ICD-10-CM

## 2015-06-03 DIAGNOSIS — R509 Fever, unspecified: Secondary | ICD-10-CM | POA: Diagnosis not present

## 2015-06-03 DIAGNOSIS — J209 Acute bronchitis, unspecified: Secondary | ICD-10-CM

## 2015-06-03 MED ORDER — LEVOFLOXACIN 500 MG PO TABS
ORAL_TABLET | ORAL | Status: DC
Start: 2015-06-03 — End: 2015-06-21

## 2015-06-03 MED ORDER — ALBUTEROL SULFATE HFA 108 (90 BASE) MCG/ACT IN AERS: INHALATION_SPRAY | RESPIRATORY_TRACT | Status: DC

## 2015-06-03 MED ORDER — BENZONATATE 200 MG PO CAPS: 200.0000 mg | ORAL_CAPSULE | Freq: Three times a day (TID) | ORAL | Status: DC | PRN

## 2015-06-03 MED ORDER — MOXIFLOXACIN HCL 400 MG PO TABS: 400.0000 mg | ORAL_TABLET | Freq: Every day | ORAL | Status: DC

## 2015-06-03 NOTE — Patient Instructions (Signed)
Push fluids and rest  Follow up with Dr Nyoka Cowden in 2 weeks  Continue other medications as ordered   Go to the ER if symptoms do not improve or worsen

## 2015-06-03 NOTE — Progress Notes (Signed)
Patient ID: Elpidio Galea., male   DOB: September 20, 1935, 79 y.o.   MRN: [id number]    Location:    [facility]   Place of Service:   OFFICE  Chief Complaint  Patient presents with  . Acute Visit    Patients c/o Has cough and fever, congestion    HPI:  79 yo male seen today for fever and cough. He reports a 1 week hx unable to eat due to MTX which he was started on recently for RA. He stopped med after 2 doses due to mouth feeling raw. He also reports associated CP due to productive cough with green sputum, SOB, HA, dizziness, fever (Tm 101F), chills, nausea no emesis, post nasal drip, interruption of sleep, sinus pressure and nasal congestion. He notes left ear pressure/pain. No sick contacts. Sx's began after taking MTX. No relief with OTC cold preps  He takes bactrim ds for infection prophylaxis   Past Medical History  Diagnosis Date  . Unspecified arthropathy, pelvic region and thigh   . Herpes simplex disease   . Pain in joint, lower leg     Bilateral knee pains  . Intestinovesical fistula   . Thrombocytopenia, unspecified ([place])   . Anxiety state, unspecified   . Depressive disorder, not elsewhere classified   . Chronic pain syndrome   . Hypopotassemia   . Insomnia, unspecified   . Dysphagia, unspecified(787.20)   . Spinal stenosis, unspecified region other than cervical   . Type II or unspecified type diabetes mellitus without mention of complication, not stated as uncontrolled   . Reflux esophagitis   . Hyperlipidemia   . Constipation   . Unspecified hereditary and idiopathic peripheral neuropathy   . Hypertension   . Myalgia and myositis, unspecified   . Malignant neoplasm of prostate ([place])   . Rheumatoid arthritis with rheumatoid factor ([facility])   . Osteoarthritis     Past Surgical History  Procedure Laterality Date  . Prostate surgery  1991  . Nasal sinus surgery  1992  . Cardiac catheterization  01/31/2004    Dr Gwenlyn Found  . Colonoscopy  08/09/2006    Dr Christian Mate,  hemorrhoids/rectal fistula  . Cystocopy  07/2006    Dr [name]    Patient Care Team: Estill Dooms, MD as PCP - General (Internal Medicine) Tomasita Morrow, NP as Nurse Practitioner (Cardiology) Deneise Lever, MD as Consulting Physician (Pulmonary Disease) Kristeen Miss, MD as Consulting Physician (Neurosurgery) Ronny Bacon, MD as Referring Physician (Surgery) Milana Na, MD (General Surgery) Bo Merino, MD as Consulting Physician (Rheumatology)  Social History   Social History  . Marital Status: Married    Spouse Name: N/A  . Number of Children: N/A  . Years of Education: N/A   Occupational History  . Not on file.   Social History Main Topics  . Smoking status: Never Smoker   . Smokeless tobacco: Not on file     Comment: Chews tobacco daily   . Alcohol Use: No  . Drug Use: No  . Sexual Activity: Not on file   Other Topics Concern  . Not on file   Social History Narrative     reports that he has never smoked. He does not have any smokeless tobacco history on file. He reports that he does not drink alcohol or use illicit drugs.  Allergies  Allergen Reactions  . Indocin [Indomethacin]   . Methadone     Medications: Patient's Medications  New Prescriptions   No medications on  file  Previous Medications   MELATONIN 3 MG TABS    Take one to two tablets at bedtime to help rest   MORPHINE (MSIR) 30 MG TABLET    Take two tablets by mouth every 12 hours as needed for pain   SULFAMETHOXAZOLE-TRIMETHOPRIM (BACTRIM DS,SEPTRA DS) 800-160 MG PER TABLET    One twice daily to control infection  Modified Medications   No medications on file  Discontinued Medications   LISINOPRIL-HYDROCHLOROTHIAZIDE (PRINZIDE,ZESTORETIC) 20-12.5 MG PER TABLET    Take one tablet once daily to control blood pressure   PREGABALIN (LYRICA) 75 MG CAPSULE    One twice daily to help neuropathic pain in feet    Review of Systems  Constitutional: Positive for fever,  chills and appetite change. Negative for activity change and fatigue.  HENT: Positive for congestion, ear pain, postnasal drip and sinus pressure. Negative for sore throat and trouble swallowing.   Eyes: Negative for visual disturbance.  Respiratory: Positive for cough and shortness of breath. Negative for chest tightness.   Cardiovascular: Negative for chest pain, palpitations and leg swelling.  Gastrointestinal: Positive for nausea. Negative for vomiting, abdominal pain and blood in stool.  Genitourinary: Negative for urgency, frequency and difficulty urinating.  Musculoskeletal: Negative for arthralgias and gait problem.  Skin: Negative for rash.  Neurological: Positive for dizziness and headaches. Negative for weakness.  Psychiatric/Behavioral: Positive for sleep disturbance. Negative for confusion. The patient is not nervous/anxious.     Filed Vitals:   06/03/15 1119  BP: 130/70  Pulse: 121  Temp: 100 F (37.8 C)  TempSrc: Oral  Resp: 20  Height: 5\' 9"  (1.753 m)  Weight: 226 lb 12.8 oz (102.876 kg)  SpO2: 94%   Body mass index is 33.48 kg/(m^2).  Physical Exam  Constitutional: He appears well-developed and well-nourished. He appears ill. No distress.  HENT:  TMs intact b/l, nonbulging no redness; No sinus TTP. Nares patent; oropharynx with cobblestoning appearance; upper >Lower gum redness with ulceration noted on upper right; no thrush noted  Eyes: Pupils are equal, round, and reactive to light. No scleral icterus.  Neck: Neck supple. No tracheal deviation present.  Cardiovascular: Regular rhythm and normal pulses.  Tachycardia present.  Exam reveals no gallop and no friction rub.   No murmur heard. +1 pitting LE edema b/l. No calf TTP  Pulmonary/Chest: No stridor. He has decreased breath sounds in the right lower field. He has no wheezes. He has no rhonchi. He has rales (expiratory) in the right lower field.  Lymphadenopathy:    He has no cervical adenopathy.    Neurological: He is alert.  Skin: Skin is warm and dry. No rash noted.  Psychiatric: He has a normal mood and affect. His behavior is normal.     Labs reviewed: Appointment on 03/25/2015  Component Date Value Ref Range Status  . Glucose 03/25/2015 89  65 - 99 mg/dL Final  . BUN 03/25/2015 15  8 - 27 mg/dL Final  . Creatinine, Ser 03/25/2015 1.24  0.76 - 1.27 mg/dL Final  . GFR calc non Af Amer 03/25/2015 56* >59 mL/min/1.73 Final  . GFR calc Af Amer 03/25/2015 64  >59 mL/min/1.73 Final  . BUN/Creatinine Ratio 03/25/2015 12  10 - 22 Final  . Sodium 03/25/2015 141  134 - 144 mmol/L Final  . Potassium 03/25/2015 4.8  3.5 - 5.2 mmol/L Final  . Chloride 03/25/2015 104  97 - 108 mmol/L Final  . CO2 03/25/2015 24  18 - 29 mmol/L Final  .  Calcium 03/25/2015 9.0  8.6 - 10.2 mg/dL Final  . Cholesterol, Total 03/25/2015 151  100 - 199 mg/dL Final  . Triglycerides 03/25/2015 132  0 - 149 mg/dL Final  . HDL 03/25/2015 32* >39 mg/dL Final   Comment: According to ATP-III Guidelines, HDL-C >59 mg/dL is considered a negative risk factor for CHD.   Marland Kitchen VLDL Cholesterol Cal 03/25/2015 26  5 - 40 mg/dL Final  . LDL Calculated 03/25/2015 93  0 - 99 mg/dL Final  . Chol/HDL Ratio 03/25/2015 4.7  0.0 - 5.0 ratio units Final   Comment:                                   T. Chol/HDL Ratio                                             Men  Women                               1/2 Avg.Risk  3.4    3.3                                   Avg.Risk  5.0    4.4                                2X Avg.Risk  9.6    7.1                                3X Avg.Risk 23.4   11.0   . WBC 03/25/2015 7.0  3.4 - 10.8 x10E3/uL Final  . RBC 03/25/2015 4.18  4.14 - 5.80 x10E6/uL Final  . Hemoglobin 03/25/2015 12.7  12.6 - 17.7 g/dL Final  . Hematocrit 03/25/2015 38.5  37.5 - 51.0 % Final  . MCV 03/25/2015 92  79 - 97 fL Final  . MCH 03/25/2015 30.4  26.6 - 33.0 pg Final  . MCHC 03/25/2015 33.0  31.5 - 35.7 g/dL Final  . RDW  03/25/2015 14.3  12.3 - 15.4 % Final  . Platelets 03/25/2015 154  150 - 379 x10E3/uL Final  . Neutrophils 03/25/2015 53   Final  . Lymphs 03/25/2015 37   Final  . Monocytes 03/25/2015 8   Final  . Eos 03/25/2015 2   Final  . Basos 03/25/2015 0   Final  . Neutrophils Absolute 03/25/2015 3.7  1.4 - 7.0 x10E3/uL Final  . Lymphocytes Absolute 03/25/2015 2.6  0.7 - 3.1 x10E3/uL Final  . Monocytes Absolute 03/25/2015 0.6  0.1 - 0.9 x10E3/uL Final  . EOS (ABSOLUTE) 03/25/2015 0.1  0.0 - 0.4 x10E3/uL Final  . Basophils Absolute 03/25/2015 0.0  0.0 - 0.2 x10E3/uL Final  . Immature Granulocytes 03/25/2015 0   Final  . Immature Grans (Abs) 03/25/2015 0.0  0.0 - 0.1 x10E3/uL Final  . Hgb A1c MFr Bld 03/25/2015 6.5* 4.8 - 5.6 % Final   Comment:          Pre-diabetes: 5.7 - 6.4  Diabetes: >6.4          Glycemic control for adults with diabetes: <7.0   . Est. average glucose Bld gHb Est-m* 03/25/2015 140   Final    No results found.   Assessment/Plan   ICD-9-CM ICD-10-CM   1. Fever, unspecified r/o pneumonia 780.60 R50.9 CBC with Differential     DG Chest 2 View     moxifloxacin (AVELOX) 400 MG tablet  2. Cough - r/o pneumonia 786.2 R05 CBC with Differential     DG Chest 2 View     moxifloxacin (AVELOX) 400 MG tablet     benzonatate (TESSALON) 200 MG capsule  3. Acute bronchitis, unspecified organism +/- pneumonia 466.0 J20.9 albuterol (PROVENTIL HFA;VENTOLIN HFA) 108 (90 BASE) MCG/ACT inhaler  4. Mucositis due to drugs - (MTX) 528.02 K12.32   5..     Rheumatoid arthritis  Push fluids and rest  Take floraster BID x 2 weeks for colon  Use lidocaine 2%/maalox/benadryl gargle swish and spit 1 tsp q3-4hrs prn sore mouth. Handwritten Rx given  ProAir HFA taper - 2puffs TID x 7 days-->BID--->daily and stop  Follow up with Dr Nyoka Cowden in 2 weeks  Continue other medications as ordered   Go to the ER if symptoms do not improve or worsen  Aman Bonet S. Perlie Gold    [facility] [address] [place], [place] [address] [phone] Cell (Monday-Friday 8 AM - 5 PM) [phone] After 5 PM and follow prompts   ADDENDUM: insurance will not cover avelox. Will change to levaquin 500mg  po daily x 10. Jae Dire

## 2015-06-03 NOTE — Telephone Encounter (Signed)
Called spoke with pharmacy to see if patients insurance would cover Levaquin and they said yes it would will send prescription for it per Dr, Eulas Post

## 2015-06-04 LAB — CBC WITH DIFFERENTIAL/PLATELET
Basophils Absolute: 0 10*3/uL (ref 0.0–0.2)
Basos: 0 %
EOS (ABSOLUTE): 0 10*3/uL (ref 0.0–0.4)
Eos: 0 %
Hematocrit: 35.6 % — ABNORMAL LOW (ref 37.5–51.0)
Hemoglobin: 11.7 g/dL — ABNORMAL LOW (ref 12.6–17.7)
Immature Grans (Abs): 0 10*3/uL (ref 0.0–0.1)
Immature Granulocytes: 0 %
Lymphocytes Absolute: 1.8 10*3/uL (ref 0.7–3.1)
Lymphs: 14 %
MCH: 29.8 pg (ref 26.6–33.0)
MCHC: 32.9 g/dL (ref 31.5–35.7)
MCV: 91 fL (ref 79–97)
Monocytes Absolute: 1.5 10*3/uL — ABNORMAL HIGH (ref 0.1–0.9)
Monocytes: 12 %
Neutrophils Absolute: 9.2 10*3/uL — ABNORMAL HIGH (ref 1.4–7.0)
Neutrophils: 74 %
Platelets: 190 10*3/uL (ref 150–379)
RBC: 3.92 x10E6/uL — ABNORMAL LOW (ref 4.14–5.80)
RDW: 14.2 % (ref 12.3–15.4)
WBC: 12.6 10*3/uL — ABNORMAL HIGH (ref 3.4–10.8)

## 2015-06-07 ENCOUNTER — Other Ambulatory Visit: Payer: Self-pay | Admitting: *Deleted

## 2015-06-07 DIAGNOSIS — M25561 Pain in right knee: Secondary | ICD-10-CM

## 2015-06-07 DIAGNOSIS — M25562 Pain in left knee: Principal | ICD-10-CM

## 2015-06-07 MED ORDER — MORPHINE SULFATE 30 MG PO TABS: ORAL_TABLET | ORAL | Status: DC

## 2015-06-07 NOTE — Telephone Encounter (Signed)
Patient requested and will pick up. Updated Narcotic Contract printed.

## 2015-06-14 ENCOUNTER — Telehealth: Payer: Self-pay | Admitting: *Deleted

## 2015-06-14 NOTE — Telephone Encounter (Signed)
Patient's spouse called regarding her husband having swelling in his ankles and feet, she stated that the pharmacist thought that he might need to have his potassium checked or be put on some lasix. I informed the wife that she could get a better result on Tuesday when they come in to see Dr. Nyoka Cowden, and he could view the legs and ankles for himself, she agreeded with this decision.

## 2015-06-21 ENCOUNTER — Ambulatory Visit (INDEPENDENT_AMBULATORY_CARE_PROVIDER_SITE_OTHER): Payer: Medicare Other | Admitting: Internal Medicine

## 2015-06-21 ENCOUNTER — Encounter: Payer: Self-pay | Admitting: Internal Medicine

## 2015-06-21 VITALS — BP 150/100 | HR 78 | Temp 98.9°F | Resp 20 | Ht 69.0 in | Wt 226.8 lb

## 2015-06-21 DIAGNOSIS — R609 Edema, unspecified: Secondary | ICD-10-CM | POA: Insufficient documentation

## 2015-06-21 DIAGNOSIS — E1142 Type 2 diabetes mellitus with diabetic polyneuropathy: Secondary | ICD-10-CM

## 2015-06-21 DIAGNOSIS — J189 Pneumonia, unspecified organism: Secondary | ICD-10-CM | POA: Diagnosis not present

## 2015-06-21 DIAGNOSIS — Z23 Encounter for immunization: Secondary | ICD-10-CM

## 2015-06-21 MED ORDER — FUROSEMIDE 40 MG PO TABS: ORAL_TABLET | ORAL | Status: DC

## 2015-06-21 NOTE — Progress Notes (Signed)
Patient ID: Ronnie Galea., male   DOB: 11/19/1935, 79 y.o.   MRN: [id number]    Facility  [facility]    Place of Service:   OFFICE    Allergies  Allergen Reactions  . Indocin [Indomethacin]   . Methadone     Chief Complaint  Patient presents with  . Medical Management of Chronic Issues    2 week follow-up for cough and fever Labs printed  . OTHER    Patients c/o Has swelling in both feet and legs and pain  . Immunizations    Discuss flu shot and Pneumonia     HPI:  CAP (community acquired pneumonia) - patient seen 06/03/2015 with fever and congestion. Subsequent x-ray suggested pneumonia. Patient feels like he is beginning to get over this now. He has not been running fever for several days. Cough continues, but is not producing any sputum. Previous congestion is improving. He is a little short of breath with exertion. CBC when last checked showed a neutrophilia. There was also mild anemia.  Edema, unspecified type - sepsis pneumonia, sleep became much more swollen. They're little better today as compared to the last couple of days. He says this is the first day in 2 weeks of increased medical put his feet inside his shoes.  DM type 2 with diabetic peripheral neuropathy (HCC) - remains under control despite his recent illness    Medications: Patient's Medications  New Prescriptions   No medications on file  Previous Medications   BENZONATATE (TESSALON) 200 MG CAPSULE       LEVOFLOXACIN (LEVAQUIN) 500 MG TABLET       MORPHINE (MSIR) 30 MG TABLET    Take two tablets by mouth every 12 hours as needed for pain   SULFAMETHOXAZOLE-TRIMETHOPRIM (BACTRIM DS,SEPTRA DS) 800-160 MG PER TABLET    One twice daily to control infection   VENTOLIN HFA 108 (90 BASE) MCG/ACT INHALER      Modified Medications   No medications on file  Discontinued Medications   ALBUTEROL (PROVENTIL HFA;VENTOLIN HFA) 108 (90 BASE) MCG/ACT INHALER    2 puffs TID x 7 days then BID x 7 days then daily x 7 days  and stop   BENZONATATE (TESSALON) 200 MG CAPSULE    Take 1 capsule (200 mg total) by mouth 3 (three) times daily as needed for cough.   LEVOFLOXACIN (LEVAQUIN) 500 MG TABLET    Take 1 tablet by mouth for 10 days   MELATONIN 3 MG TABS    Take one to two tablets at bedtime to help rest    Review of Systems  Constitutional: Negative for fever, chills, activity change, appetite change and fatigue.  HENT: Negative for congestion, ear pain, postnasal drip, sinus pressure, sore throat and trouble swallowing.   Eyes: Negative for visual disturbance.  Respiratory: Positive for cough and shortness of breath. Negative for chest tightness.   Cardiovascular: Negative for chest pain, palpitations and leg swelling.  Gastrointestinal: Negative for nausea, vomiting, abdominal pain and blood in stool.  Genitourinary: Negative for urgency, frequency and difficulty urinating.  Musculoskeletal: Negative for arthralgias and gait problem.  Skin: Negative for rash.  Neurological: Negative for dizziness, weakness and headaches.  Psychiatric/Behavioral: Positive for sleep disturbance. Negative for confusion. The patient is not nervous/anxious.     Filed Vitals:   06/21/15 1620  BP: 150/100  Pulse: 78  Temp: 98.9 F (37.2 C)  TempSrc: Oral  Resp: 20  Height: $Remove'5\' 9"'OwFtcYj$  (1.753 m)  Weight: 226 lb  12.8 oz (102.876 kg)  SpO2: 95%   Body mass index is 33.48 kg/(m^2).  Physical Exam  Constitutional: He is oriented to person, place, and time. He appears well-developed and well-nourished. No distress.  Overweight  HENT:  Right Ear: External ear normal.  Left Ear: External ear normal.  Nose: Nose normal.  Mouth/Throat: Oropharynx is clear and moist. No oropharyngeal exudate.  Significant partial hearing loss  Eyes: Conjunctivae and EOM are normal. Pupils are equal, round, and reactive to light.  Neck: No JVD present. No tracheal deviation present. No thyromegaly present.  Cardiovascular: Normal rate, regular  rhythm, normal heart sounds and intact distal pulses.  Exam reveals no gallop and no friction rub.   No murmur heard. Pulmonary/Chest: No respiratory distress. He has no wheezes. He has rales (right lower lobe posteriorly). He exhibits no tenderness.  Abdominal: He exhibits no distension and no mass. There is no tenderness.  Musculoskeletal: He exhibits edema and tenderness.  Multiple painful areas including the neck, shoulders, back, hips, and knees. Diminished range of motion at the shoulders.  Lymphadenopathy:    He has no cervical adenopathy.  Neurological: He is alert and oriented to person, place, and time. He has normal reflexes. No cranial nerve deficit. Coordination normal.  Decreased sensation to Vibration and monofilament  Skin: No rash noted. No erythema. No pallor.  Psychiatric: He has a normal mood and affect. His behavior is normal. Judgment and thought content normal.    Labs reviewed: Lab Summary Latest Ref Rng 06/03/2015 03/25/2015 12/22/2014 06/29/2014  Hemoglobin 12.6 - 17.7 g/dL 11.7(L) 12.7 13.6 (None)  Hematocrit 37.5 - 51.0 % 35.6(L) 38.5 41.9 (None)  White count 3.4 - 10.8 x10E3/uL 12.6(H) 7.0 8.2 (None)  Platelet count 150 - 379 x10E3/uL 190 154 154 (None)  Sodium 134 - 144 mmol/L (None) 141 141 138  Potassium 3.5 - 5.2 mmol/L (None) 4.8 4.6 4.2  Calcium 8.6 - 10.2 mg/dL (None) 9.0 9.0 9.7(J)  Phosphorus - (None) (None) (None) (None)  Creatinine 0.76 - 1.27 mg/dL (None) 8.83 2.54(D) 8.26  AST 0 - 40 IU/L (None) (None) (None) 20  Alk Phos 39 - 117 IU/L (None) (None) (None) 66  Bilirubin 0.0 - 1.2 mg/dL (None) (None) (None) 0.4  Glucose 65 - 99 mg/dL (None) 89 415(A) 309(M)  Cholesterol - (None) (None) (None) (None)  HDL cholesterol >39 mg/dL (None) 07(W) 53 44  Triglycerides 0 - 149 mg/dL (None) 808 99 83  LDL Direct - (None) (None) (None) (None)  LDL Calc 0 - 99 mg/dL (None) 93 811(S) 315(X)  Total protein - (None) (None) (None) (None)  Albumin 3.5 - 4.8 g/dL  (None) (None) (None) 4.1   No results found for: TSH, T3TOTAL, T4TOTAL, THYROIDAB Lab Results  Component Value Date   BUN 15 03/25/2015   Lab Results  Component Value Date   HGBA1C 6.5* 03/25/2015   Dg Chest 2 View  06/03/2015  CLINICAL DATA:  Cough, fever shortness-of-breath 2 weeks. Evaluate for pneumonia. EXAM: CHEST  2 VIEW COMPARISON:  01/18/2010 FINDINGS: Lungs are adequately inflated with opacification over the medial right base as well as minimally in the left retrocardiac region and posteriorly on the lateral film as findings likely due to infection. No significant effusion. Cardiac silhouette is within normal. There is mild calcified plaque over the thoracic aorta. Mild degenerate change of the spine. IMPRESSION: Possible multifocal airspace process most notable over the medial right base likely infection. Electronically Signed   By: Elberta Fortis M.D.  On: 06/03/2015 13:46   Assessment/Plan 1. CAP (community acquired pneumonia) improving - CBC With Differential; Future - Comprehensive metabolic panel; Future  2. Edema, unspecified type - Comprehensive metabolic panel; Future - Uric acid; Future - furosemide (LASIX) 40 MG tablet; One daily to prevent fluid retention  Dispense: 30 tablet; Refill: 3  3. DM type 2 with diabetic peripheral neuropathy (HCC) -CMP

## 2015-07-01 ENCOUNTER — Other Ambulatory Visit: Payer: Medicare Other

## 2015-07-01 DIAGNOSIS — R609 Edema, unspecified: Secondary | ICD-10-CM

## 2015-07-01 DIAGNOSIS — J189 Pneumonia, unspecified organism: Secondary | ICD-10-CM | POA: Diagnosis not present

## 2015-07-01 DIAGNOSIS — E785 Hyperlipidemia, unspecified: Secondary | ICD-10-CM | POA: Diagnosis not present

## 2015-07-02 LAB — COMPREHENSIVE METABOLIC PANEL
ALT: 12 IU/L (ref 0–44)
AST: 16 IU/L (ref 0–40)
Albumin/Globulin Ratio: 1.7 (ref 1.1–2.5)
Albumin: 4.3 g/dL (ref 3.5–4.8)
Alkaline Phosphatase: 72 IU/L (ref 39–117)
BUN/Creatinine Ratio: 10 (ref 10–22)
BUN: 13 mg/dL (ref 8–27)
Bilirubin Total: 0.8 mg/dL (ref 0.0–1.2)
CO2: 26 mmol/L (ref 18–29)
Calcium: 8.7 mg/dL (ref 8.6–10.2)
Chloride: 99 mmol/L (ref 97–106)
Creatinine, Ser: 1.31 mg/dL — ABNORMAL HIGH (ref 0.76–1.27)
GFR calc Af Amer: 60 mL/min/{1.73_m2} (ref >59)
GFR calc non Af Amer: 52 mL/min/{1.73_m2} — ABNORMAL LOW (ref >59)
Globulin, Total: 2.5 g/dL (ref 1.5–4.5)
Glucose: 102 mg/dL — ABNORMAL HIGH (ref 65–99)
Potassium: 3.9 mmol/L (ref 3.5–5.2)
Sodium: 139 mmol/L (ref 136–144)
Total Protein: 6.8 g/dL (ref 6.0–8.5)

## 2015-07-02 LAB — CBC WITH DIFFERENTIAL
Basophils Absolute: 0 10*3/uL (ref 0.0–0.2)
Basos: 0 %
EOS (ABSOLUTE): 0.2 10*3/uL (ref 0.0–0.4)
Eos: 3 %
Hematocrit: 39 % (ref 37.5–51.0)
Hemoglobin: 12.9 g/dL (ref 12.6–17.7)
Immature Grans (Abs): 0 10*3/uL (ref 0.0–0.1)
Immature Granulocytes: 0 %
Lymphocytes Absolute: 2.2 10*3/uL (ref 0.7–3.1)
Lymphs: 35 %
MCH: 30.4 pg (ref 26.6–33.0)
MCHC: 33.1 g/dL (ref 31.5–35.7)
MCV: 92 fL (ref 79–97)
Monocytes Absolute: 0.5 10*3/uL (ref 0.1–0.9)
Monocytes: 8 %
Neutrophils Absolute: 3.4 10*3/uL (ref 1.4–7.0)
Neutrophils: 54 %
RBC: 4.25 x10E6/uL (ref 4.14–5.80)
RDW: 14.7 % (ref 12.3–15.4)
WBC: 6.4 10*3/uL (ref 3.4–10.8)

## 2015-07-02 LAB — LIPID PANEL
Chol/HDL Ratio: 4.9 ratio units (ref 0.0–5.0)
Cholesterol, Total: 161 mg/dL (ref 100–199)
HDL: 33 mg/dL — ABNORMAL LOW (ref >39)
LDL Calculated: 95 mg/dL (ref 0–99)
Triglycerides: 164 mg/dL — ABNORMAL HIGH (ref 0–149)
VLDL Cholesterol Cal: 33 mg/dL (ref 5–40)

## 2015-07-02 LAB — URIC ACID: Uric Acid: 7 mg/dL (ref 3.7–8.6)

## 2015-07-05 ENCOUNTER — Ambulatory Visit (INDEPENDENT_AMBULATORY_CARE_PROVIDER_SITE_OTHER): Payer: Medicare Other | Admitting: Internal Medicine

## 2015-07-05 ENCOUNTER — Encounter: Payer: Self-pay | Admitting: Internal Medicine

## 2015-07-05 VITALS — BP 102/72 | HR 80 | Temp 97.7°F | Resp 20 | Ht 69.0 in | Wt 219.4 lb

## 2015-07-05 DIAGNOSIS — J189 Pneumonia, unspecified organism: Secondary | ICD-10-CM | POA: Diagnosis not present

## 2015-07-05 DIAGNOSIS — E1142 Type 2 diabetes mellitus with diabetic polyneuropathy: Secondary | ICD-10-CM | POA: Diagnosis not present

## 2015-07-05 DIAGNOSIS — R609 Edema, unspecified: Secondary | ICD-10-CM | POA: Diagnosis not present

## 2015-07-05 DIAGNOSIS — M25562 Pain in left knee: Secondary | ICD-10-CM

## 2015-07-05 DIAGNOSIS — R05 Cough: Secondary | ICD-10-CM | POA: Diagnosis not present

## 2015-07-05 DIAGNOSIS — M545 Low back pain, unspecified: Secondary | ICD-10-CM | POA: Insufficient documentation

## 2015-07-05 DIAGNOSIS — E785 Hyperlipidemia, unspecified: Secondary | ICD-10-CM | POA: Diagnosis not present

## 2015-07-05 DIAGNOSIS — M25561 Pain in right knee: Secondary | ICD-10-CM | POA: Diagnosis not present

## 2015-07-05 DIAGNOSIS — M25551 Pain in right hip: Secondary | ICD-10-CM | POA: Diagnosis not present

## 2015-07-05 DIAGNOSIS — R059 Cough, unspecified: Secondary | ICD-10-CM

## 2015-07-05 MED ORDER — BENZONATATE 200 MG PO CAPS: ORAL_CAPSULE | ORAL | Status: DC

## 2015-07-05 NOTE — Progress Notes (Signed)
Patient ID: Ronnie Jackson., male   DOB: October 20, 1935, 79 y.o.   MRN: [id number]    Facility  [facility]    Place of Service:   OFFICE    Allergies  Allergen Reactions  . Indocin [Indomethacin]   . Methadone     Chief Complaint  Patient presents with  . Medical Management of Chronic Issues    2 week follow-up for DM    HPI:   CAP (community acquired pneumonia) - resolved. Has a slight residual cough. It produces occasional yellowish sputum. No fevers. Cough does not keep him awake. No hemoptysis.  Edema, unspecified type - significantly improved with the addition of furosemide  Hyperlipemia - controlled. HDL is low  Arthralgia of both knees  - left knee is hurting so badly 7 some difficulty walking  Pain in joint, pelvic region and thigh, right - right hip is causing him to limp and had difficulty walking. He has had injections in this joint previously. Last when I find was in 2014. X-rays of this area in January 2016 showed moderately severe degenerative joint disease. Patient feels that he may need surgery. His sister had this joint replacement by Dr. Mayer Camel. Patient has seen rheumatologist, Dr. Charlestine Night.  DM type 2 with diabetic peripheral neuropathy (HCC) - Blood sugar was elevated while hospitalized, but now is back under control   Bilateral low back pain without sciatica - difficult to sit or stand due to to back pains   Cough - persistent post pneumonia cough with occasional yellowish sputum    Medications: Patient's Medications  New Prescriptions   No medications on file  Previous Medications   BENZONATATE (TESSALON) 200 MG CAPSULE       FUROSEMIDE (LASIX) 40 MG TABLET    One daily to prevent fluid retention   MORPHINE (MSIR) 30 MG TABLET    Take two tablets by mouth every 12 hours as needed for pain   SULFAMETHOXAZOLE-TRIMETHOPRIM (BACTRIM DS,SEPTRA DS) 800-160 MG PER TABLET    One twice daily to control infection  Modified Medications   No medications on file    Discontinued Medications   LEVOFLOXACIN (LEVAQUIN) 500 MG TABLET       VENTOLIN HFA 108 (90 BASE) MCG/ACT INHALER        Review of Systems  Constitutional: Negative for fever, chills, activity change, appetite change and fatigue.  HENT: Negative for congestion, ear pain, postnasal drip, sinus pressure, sore throat and trouble swallowing.   Eyes: Negative for visual disturbance.  Respiratory: Positive for cough. Negative for chest tightness and shortness of breath.   Cardiovascular: Negative for chest pain, palpitations and leg swelling.  Gastrointestinal: Negative for nausea, vomiting, abdominal pain and blood in stool.  Genitourinary: Negative for urgency, frequency and difficulty urinating.  Musculoskeletal: Negative for arthralgias and gait problem.  Skin: Negative for rash.  Neurological: Negative for dizziness, weakness and headaches.  Psychiatric/Behavioral: Positive for sleep disturbance. Negative for confusion. The patient is not nervous/anxious.     Filed Vitals:   07/05/15 1436  BP: 102/72  Pulse: 80  Temp: 97.7 F (36.5 C)  TempSrc: Oral  Resp: 20  Height: $Remove'5\' 9"'LdKbJsH$  (1.753 m)  Weight: 219 lb 6.4 oz (99.519 kg)  SpO2: 96%   Body mass index is 32.38 kg/(m^2).  Physical Exam  Constitutional: He is oriented to person, place, and time. He appears well-developed and well-nourished. No distress.  Overweight  HENT:  Right Ear: External ear normal.  Left Ear: External ear normal.  Nose:  Nose normal.  Mouth/Throat: Oropharynx is clear and moist. No oropharyngeal exudate.  Significant partial hearing loss  Eyes: Conjunctivae and EOM are normal. Pupils are equal, round, and reactive to light.  Neck: No JVD present. No tracheal deviation present. No thyromegaly present.  Cardiovascular: Normal rate, regular rhythm, normal heart sounds and intact distal pulses.  Exam reveals no gallop and no friction rub.   No murmur heard. Pulmonary/Chest: No respiratory distress. He has no  wheezes. He has rales (right lower lobe posteriorly). He exhibits no tenderness.  Abdominal: He exhibits no distension and no mass. There is no tenderness.  Musculoskeletal: He exhibits edema and tenderness.  Multiple painful areas including the neck, shoulders, back, hips, and knees. Diminished range of motion at the shoulders. Increased pain with flexion, extension, rotation of the back Increased pain with abduction or flexion of the right hip. Pain with palpation of the groin area. No bulge. Pain with extension of either knee associated with crepitus.  Lymphadenopathy:    He has no cervical adenopathy.  Neurological: He is alert and oriented to person, place, and time. He has normal reflexes. No cranial nerve deficit. Coordination normal.  Decreased sensation to Vibration and monofilament  Skin: No rash noted. No erythema. No pallor.  Psychiatric: He has a normal mood and affect. His behavior is normal. Judgment and thought content normal.    Labs reviewed: Lab Summary Latest Ref Rng 07/01/2015 06/03/2015 03/25/2015  Hemoglobin 12.6 - 17.7 g/dL 12.9 11.7(L) 12.7  Hematocrit 37.5 - 51.0 % 39.0 35.6(L) 38.5  White count 3.4 - 10.8 x10E3/uL 6.4 12.6(H) 7.0  Platelet count 150 - 379 x10E3/uL (None) 190 154  Sodium 136 - 144 mmol/L 139 (None) 141  Potassium 3.5 - 5.2 mmol/L 3.9 (None) 4.8  Calcium 8.6 - 10.2 mg/dL 8.7 (None) 9.0  Phosphorus - (None) (None) (None)  Creatinine 0.76 - 1.27 mg/dL 1.31(H) (None) 1.24  AST 0 - 40 IU/L 16 (None) (None)  Alk Phos 39 - 117 IU/L 72 (None) (None)  Bilirubin 0.0 - 1.2 mg/dL 0.8 (None) (None)  Glucose 65 - 99 mg/dL 102(H) (None) 89  Cholesterol - (None) (None) (None)  HDL cholesterol >39 mg/dL 33(L) (None) 32(L)  Triglycerides 0 - 149 mg/dL 164(H) (None) 132  LDL Direct - (None) (None) (None)  LDL Calc 0 - 99 mg/dL 95 (None) 93  Total protein - (None) (None) (None)  Albumin 3.5 - 4.8 g/dL 4.3 (None) (None)   No results found for: TSH, T3TOTAL,  T4TOTAL, THYROIDAB Lab Results  Component Value Date   BUN 13 07/01/2015   Lab Results  Component Value Date   HGBA1C 6.5* 03/25/2015    Assessment/Plan 1. CAP (community acquired pneumronia Resolved  2. Edema, unspecified type Improved - Comprehensive metabolic panel; Future  3. Hyperlipemia - Lipid panel; Future  4. Arthralgia of both knees - DG Knee 4 Views W/Patella Left; Future  5. Pain in joint, pelvic region and thigh, right - DG Arthro Hip Right; Future  6. DM type 2 with diabetic peripheral neuropathy (HCC) - Hemoglobin A1c; Future - Comprehensive metabolic panel; Future - Microalbumin, urine; Future  7. Bilateral low back pain without sciatica - DG Lumbar Spine Complete; Future  8. Cough - benzonatate (TESSALON) 200 MG capsule; One every 12 hours if needed for cough  Dispense: 20 capsule; Refill: 2'

## 2015-07-07 ENCOUNTER — Ambulatory Visit
Admission: RE | Admit: 2015-07-07 | Discharge: 2015-07-07 | Disposition: A | Discharge status: Home / Self Care | Payer: Medicare Other | Source: Ambulatory Visit | Attending: Internal Medicine | Admitting: Internal Medicine

## 2015-07-07 ENCOUNTER — Other Ambulatory Visit: Payer: Self-pay

## 2015-07-07 ENCOUNTER — Other Ambulatory Visit: Payer: Self-pay | Admitting: Internal Medicine

## 2015-07-07 DIAGNOSIS — M25562 Pain in left knee: Principal | ICD-10-CM

## 2015-07-07 DIAGNOSIS — M1611 Unilateral primary osteoarthritis, right hip: Secondary | ICD-10-CM | POA: Diagnosis not present

## 2015-07-07 DIAGNOSIS — M25551 Pain in right hip: Secondary | ICD-10-CM

## 2015-07-07 DIAGNOSIS — M25561 Pain in right knee: Secondary | ICD-10-CM

## 2015-07-07 DIAGNOSIS — M545 Low back pain, unspecified: Secondary | ICD-10-CM

## 2015-07-07 DIAGNOSIS — M47816 Spondylosis without myelopathy or radiculopathy, lumbar region: Secondary | ICD-10-CM | POA: Diagnosis not present

## 2015-07-07 DIAGNOSIS — M179 Osteoarthritis of knee, unspecified: Secondary | ICD-10-CM | POA: Diagnosis not present

## 2015-07-07 NOTE — Addendum Note (Signed)
Addended by: Ripley Fraise on: 07/07/2015 09:02 AM   Modules accepted: Orders

## 2015-07-08 ENCOUNTER — Other Ambulatory Visit: Payer: Self-pay | Admitting: *Deleted

## 2015-07-08 DIAGNOSIS — M25562 Pain in left knee: Principal | ICD-10-CM

## 2015-07-08 DIAGNOSIS — M25561 Pain in right knee: Secondary | ICD-10-CM

## 2015-07-08 MED ORDER — MORPHINE SULFATE 30 MG PO TABS: ORAL_TABLET | ORAL | Status: DC

## 2015-07-08 NOTE — Telephone Encounter (Signed)
Patient requested and will pick up 

## 2015-08-08 ENCOUNTER — Other Ambulatory Visit: Payer: No Typology Code available for payment source

## 2015-08-10 ENCOUNTER — Ambulatory Visit: Payer: No Typology Code available for payment source | Admitting: Internal Medicine

## 2015-08-12 ENCOUNTER — Other Ambulatory Visit: Payer: Self-pay

## 2015-08-12 DIAGNOSIS — M25561 Pain in right knee: Secondary | ICD-10-CM

## 2015-08-12 DIAGNOSIS — M25562 Pain in left knee: Principal | ICD-10-CM

## 2015-08-12 MED ORDER — MORPHINE SULFATE 30 MG PO TABS: ORAL_TABLET | ORAL | Status: DC

## 2015-08-12 MED FILL — MORPHINE SULFATE IR 30 MG T: 30 | 30 days supply | Qty: 120 | Fill #0

## 2015-08-16 DIAGNOSIS — H353131 Nonexudative age-related macular degeneration, bilateral, early dry stage: Secondary | ICD-10-CM | POA: Diagnosis not present

## 2015-08-16 DIAGNOSIS — H04123 Dry eye syndrome of bilateral lacrimal glands: Secondary | ICD-10-CM | POA: Diagnosis not present

## 2015-08-16 DIAGNOSIS — E119 Type 2 diabetes mellitus without complications: Secondary | ICD-10-CM | POA: Diagnosis not present

## 2015-08-16 DIAGNOSIS — Z961 Presence of intraocular lens: Secondary | ICD-10-CM | POA: Diagnosis not present

## 2015-08-16 DIAGNOSIS — H10413 Chronic giant papillary conjunctivitis, bilateral: Secondary | ICD-10-CM | POA: Diagnosis not present

## 2015-08-16 LAB — HM DIABETES EYE EXAM

## 2015-08-19 ENCOUNTER — Encounter: Payer: Self-pay | Admitting: *Deleted

## 2015-09-09 ENCOUNTER — Other Ambulatory Visit: Payer: Self-pay | Admitting: *Deleted

## 2015-09-09 DIAGNOSIS — M25561 Pain in right knee: Secondary | ICD-10-CM

## 2015-09-09 DIAGNOSIS — M25562 Pain in left knee: Principal | ICD-10-CM

## 2015-09-09 MED ORDER — MORPHINE SULFATE 30 MG PO TABS: ORAL_TABLET | ORAL | Status: DC

## 2015-09-09 MED FILL — MORPHINE SULFATE IR 30 MG T: 30 | 30 days supply | Qty: 120 | Fill #0

## 2015-09-09 NOTE — Telephone Encounter (Signed)
Patient wife requested and picked up 

## 2015-10-05 ENCOUNTER — Ambulatory Visit (INDEPENDENT_AMBULATORY_CARE_PROVIDER_SITE_OTHER): Payer: PPO | Admitting: Internal Medicine

## 2015-10-05 ENCOUNTER — Encounter: Payer: Self-pay | Admitting: Internal Medicine

## 2015-10-05 VITALS — BP 160/94 | HR 74 | Temp 97.5°F | Ht 69.0 in | Wt 231.0 lb

## 2015-10-05 DIAGNOSIS — E785 Hyperlipidemia, unspecified: Secondary | ICD-10-CM

## 2015-10-05 DIAGNOSIS — M8949 Other hypertrophic osteoarthropathy, multiple sites: Secondary | ICD-10-CM

## 2015-10-05 DIAGNOSIS — M150 Primary generalized (osteo)arthritis: Secondary | ICD-10-CM

## 2015-10-05 DIAGNOSIS — E669 Obesity, unspecified: Secondary | ICD-10-CM

## 2015-10-05 DIAGNOSIS — M159 Polyosteoarthritis, unspecified: Secondary | ICD-10-CM

## 2015-10-05 DIAGNOSIS — M05769 Rheumatoid arthritis with rheumatoid factor of unspecified knee without organ or systems involvement: Secondary | ICD-10-CM | POA: Diagnosis not present

## 2015-10-05 DIAGNOSIS — M25562 Pain in left knee: Secondary | ICD-10-CM | POA: Diagnosis not present

## 2015-10-05 DIAGNOSIS — M25561 Pain in right knee: Secondary | ICD-10-CM | POA: Diagnosis not present

## 2015-10-05 DIAGNOSIS — G894 Chronic pain syndrome: Secondary | ICD-10-CM

## 2015-10-05 DIAGNOSIS — R609 Edema, unspecified: Secondary | ICD-10-CM

## 2015-10-05 DIAGNOSIS — E1142 Type 2 diabetes mellitus with diabetic polyneuropathy: Secondary | ICD-10-CM

## 2015-10-05 MED ORDER — MELOXICAM 15 MG PO TABS: ORAL_TABLET | ORAL | Status: DC

## 2015-10-05 MED ORDER — FUROSEMIDE 40 MG PO TABS: ORAL_TABLET | ORAL | Status: DC

## 2015-10-05 MED ORDER — MORPHINE SULFATE 30 MG PO TABS: ORAL_TABLET | ORAL | Status: DC

## 2015-10-05 MED FILL — MELOXICAM 15 MG TABLET: 15 | 30 days supply | Qty: 30 | Fill #0

## 2015-10-05 NOTE — Progress Notes (Signed)
Patient ID: Ronnie Galea., male   DOB: 05-14-1936, 80 y.o.   MRN: [id number]   Location:  [facility]  Provider: Jeanmarie Hubert, M.D.  Code Status: full Goals of Care:  Advanced Directives 10/05/2015  Does patient have an advance directive? No  Would patient like information on creating an advanced directive? -     Chief Complaint  Patient presents with  . Medical Management of Chronic Issues    blood sugar, blood pressure, edema, cholesterol. Here with wife.  . Edema    feet and legs, Lasix helps, but doesn't take Lasix daily, should he?  . pain    all over     HPI: Patient is a 80 y.o. male seen today for medical management of chronic diseases.      DM type 2 with diabetic peripheral neuropathy (HCC) - patient's family left prior to this visit. He believes his diabetes under good control.   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 He has no clinical stigmata of rheumatoid arthritis.   Obesity (BMI 30-39.9)-patient has not lost any weight and is unlikely to in the future. Dietary noncompliance.  Primary osteoarthritis involving multiple joints - He tried indomethacin for the pain and felt much better.  Arthralgia of both knees - continues to benefit from morphine (MSIR) 30 MG tablet     Past Medical History  Diagnosis Date  . Unspecified arthropathy, pelvic region and thigh   . Herpes simplex disease   . Pain in joint, lower leg     Bilateral knee pains  . Intestinovesical fistula   . Thrombocytopenia, unspecified ([place])   . Anxiety state, unspecified   . Depressive disorder, not elsewhere classified   . Chronic pain  syndrome   . Hypopotassemia   . Insomnia, unspecified   . Dysphagia, unspecified(787.20)   . Spinal stenosis, unspecified region other than cervical   . Type II or unspecified type diabetes mellitus without mention of complication, not stated as uncontrolled   . Reflux esophagitis   . Hyperlipidemia   . Constipation   . Unspecified hereditary and idiopathic peripheral neuropathy   . Hypertension   . Myalgia and myositis, unspecified   . Malignant neoplasm of prostate ([place])   . Rheumatoid arthritis with rheumatoid factor (HCC)   . Osteoarthritis     Past Surgical History  Procedure Laterality Date  . Prostate surgery  1991  . Nasal sinus surgery  1992  . Cardiac catheterization  01/31/2004    Dr Gwenlyn Found  . Colonoscopy  08/09/2006    Dr Christian Mate, hemorrhoids/rectal fistula  . Cystocopy  07/2006    Dr [name]    Allergies  Allergen Reactions  . Indocin [Indomethacin]   . Methadone       Medication List       This list is accurate as of: 10/05/15  1:33 PM.  Always use your most recent med list.               cromolyn 4 % ophthalmic solution  Commonly known as:  OPTICROM     furosemide 40 MG tablet  Commonly known as:  LASIX  One daily to prevent fluid retention     morphine  30 MG tablet  Commonly known as:  MSIR  Take two tablets by mouth every 12 hours as needed for pain     sulfamethoxazole-trimethoprim 800-160 MG tablet  Commonly known as:  BACTRIM DS,SEPTRA DS  One twice daily to control infection        Review of Systems:  Review of Systems  Constitutional: Negative for fever, chills, activity change, appetite change and fatigue.  HENT: Negative for congestion, ear pain, postnasal drip, sinus pressure, sore throat and trouble swallowing.   Eyes: Negative for visual disturbance.  Respiratory: Positive for cough. Negative for chest tightness and shortness of breath.   Cardiovascular: Negative for chest pain, palpitations and leg swelling.    Gastrointestinal: Negative for nausea, vomiting, abdominal pain and blood in stool.  Genitourinary: Negative for urgency, frequency and difficulty urinating.       History of rectovesical fistula  Musculoskeletal: Positive for myalgias, back pain, arthralgias, neck pain and neck stiffness. Negative for gait problem.  Skin: Negative for rash.  Neurological: Negative for dizziness, weakness and headaches.  Psychiatric/Behavioral: Positive for sleep disturbance. Negative for confusion. The patient is not nervous/anxious.     Health Maintenance  Topic Date Due  . URINE MICROALBUMIN  02/23/2015  . HEMOGLOBIN A1C  09/22/2015  . ZOSTAVAX  11/02/2015 (Originally 08/02/1997)  . TETANUS/TDAP  11/01/2024 (Originally 08/02/1956)  . FOOT EXAM  12/22/2015  . INFLUENZA VACCINE  02/21/2016  . PNA vac Low Risk Adult (2 of 2 - PPSV23) 06/20/2016  . OPHTHALMOLOGY EXAM  08/15/2016    Physical Exam: Filed Vitals:   10/05/15 1207  BP: 160/94  Pulse: 74  Temp: 97.5 F (36.4 C)  TempSrc: Oral  Height: 5\' 9"  (1.753 m)  Weight: 231 lb (104.781 kg)  SpO2: 95%   Body mass index is 34.1 kg/(m^2). Physical Exam  Constitutional: He is oriented to person, place, and time. He appears well-developed and well-nourished. No distress.  Overweight  HENT:  Right Ear: External ear normal.  Left Ear: External ear normal.  Nose: Nose normal.  Mouth/Throat: Oropharynx is clear and moist. No oropharyngeal exudate.  Significant partial hearing loss  Eyes: Conjunctivae and EOM are normal. Pupils are equal, round, and reactive to light.  Neck: No JVD present. No tracheal deviation present. No thyromegaly present.  Cardiovascular: Normal rate, regular rhythm, normal heart sounds and intact distal pulses.  Exam reveals no gallop and no friction rub.   No murmur heard. Pulmonary/Chest: No respiratory distress. He has no wheezes. He has rales (right lower lobe posteriorly). He exhibits no tenderness.  Abdominal: He  exhibits no distension and no mass. There is no tenderness.  Musculoskeletal: He exhibits edema and tenderness.  Multiple painful areas including the neck, shoulders, back, hips, and knees. Diminished range of motion at the shoulders. Increased pain with flexion, extension, rotation of the back Increased pain with abduction or flexion of the right hip. Pain with palpation of the groin area. No bulge. Pain with extension of either knee associated with crepitus.  Lymphadenopathy:    He has no cervical adenopathy.  Neurological: He is alert and oriented to person, place, and time. He has normal reflexes. No cranial nerve deficit. Coordination normal.  Decreased sensation to Vibration and monofilament  Skin: No rash noted. No erythema. No pallor.  Psychiatric: He has a normal mood and affect. His behavior is normal. Judgment and thought content normal.    Labs reviewed: Basic Metabolic Panel:  Recent Labs  12/22/14 1328 03/25/15 0921 07/01/15 0944  NA 141 141 139  K 4.6 4.8 3.9  CL 104 104 99  CO2 23 24 26   GLUCOSE 123* 89 102*  BUN 24 15 13   CREATININE 1.51* 1.24 1.31*  CALCIUM 9.0 9.0 8.7   Liver Function Tests:  Recent Labs  07/01/15 0944  AST 16  ALT 12  ALKPHOS 72  BILITOT 0.8  PROT 6.8  ALBUMIN 4.3   No results for input(s): LIPASE, AMYLASE in the last 8760 hours. No results for input(s): AMMONIA in the last 8760 hours. CBC:  Recent Labs  12/22/14 1332 03/25/15 0921 06/03/15 1258 07/01/15 0944  WBC 8.2 7.0 12.6* 6.4  NEUTROABS 5.2 3.7 9.2* 3.4  HCT 41.9 38.5 35.6* 39.0  MCV 93 92 91 92  PLT 154 154 190  --    Lipid Panel:  Recent Labs  12/22/14 1328 03/25/15 0921 07/01/15 0944  CHOL 190 151 161  HDL 53 32* 33*  LDLCALC 117* 93 95  TRIG 99 132 164*  CHOLHDL 3.6 4.7 4.9   Lab Results  Component Value Date   HGBA1C 6.5* 03/25/2015    Assessment/Plan 1. DM type 2 with diabetic peripheral neuropathy (HCC) - Comprehensive metabolic panel;  Future - Hemoglobin A1c; Future - Microalbumin, urine; Future  2. Edema, unspecified type - furosemide (LASIX) 40 MG tablet; One daily to prevent fluid retention  Dispense: 30 tablet; Refill: 5 - Urinalysis, future  3. Hyperlipemia - Lipid panel; Future  4. Chronic pain syndrome - morphine (MSIR) 30 MG tablet; Take two tablets by mouth every 12 hours as needed for pain  Dispense: 120 tablet; Refill: 0  5. 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 Obesity (BMI 30-39.9) Dietary Compliance  7. Primary osteoarthritis involving multiple joints - meloxicam (MOBIC) 15 MG tablet; One daily to help arthritis  Dispense: 30 tablet; Refill: 5  8. Arthralgia of both knees - morphine (MSIR) 30 MG tablet; Take two tablets by mouth every 12 hours as needed for pain  Dispense: 120 tablet; Refill: 0    Labs/tests ordered:  @ORDERS @ Next appt:  Visit date not found

## 2015-10-07 MED FILL — MORPHINE SULFATE IR 30 MG T: 30 | 30 days supply | Qty: 120 | Fill #0

## 2015-10-11 MED FILL — FUROSEMIDE 40 MG TABLET: 40 | 30 days supply | Qty: 30 | Fill #2

## 2015-11-08 ENCOUNTER — Other Ambulatory Visit: Payer: Self-pay

## 2015-11-08 DIAGNOSIS — G894 Chronic pain syndrome: Secondary | ICD-10-CM

## 2015-11-08 DIAGNOSIS — M25561 Pain in right knee: Secondary | ICD-10-CM

## 2015-11-08 DIAGNOSIS — M25562 Pain in left knee: Principal | ICD-10-CM

## 2015-11-08 MED ORDER — MORPHINE SULFATE 30 MG PO TABS: ORAL_TABLET | ORAL | Status: DC

## 2015-11-08 MED FILL — MORPHINE SULFATE IR 30 MG T: 30 | 30 days supply | Qty: 120 | Fill #0

## 2015-11-08 NOTE — Telephone Encounter (Signed)
Patients wife called to ask for her husband's pain medication to be refilled today and that she would pick it up.I Told her it would be ready this afternoon.

## 2015-12-15 ENCOUNTER — Other Ambulatory Visit: Payer: Self-pay | Admitting: *Deleted

## 2015-12-15 DIAGNOSIS — M25562 Pain in left knee: Principal | ICD-10-CM

## 2015-12-15 DIAGNOSIS — M25561 Pain in right knee: Secondary | ICD-10-CM

## 2015-12-15 DIAGNOSIS — G894 Chronic pain syndrome: Secondary | ICD-10-CM

## 2015-12-15 MED ORDER — MORPHINE SULFATE 30 MG PO TABS: ORAL_TABLET | ORAL | Status: DC

## 2015-12-15 MED FILL — MORPHINE SULFATE IR 30 MG T: 30 | 30 days supply | Qty: 120 | Fill #0

## 2015-12-15 NOTE — Telephone Encounter (Signed)
Patient was notified that Rx was ready for pick up. She said that she was on the way.

## 2015-12-15 NOTE — Telephone Encounter (Signed)
Patient wife, [name] requested and will pick up

## 2016-01-16 ENCOUNTER — Other Ambulatory Visit: Payer: Self-pay | Admitting: *Deleted

## 2016-01-16 DIAGNOSIS — M25561 Pain in right knee: Secondary | ICD-10-CM

## 2016-01-16 DIAGNOSIS — G894 Chronic pain syndrome: Secondary | ICD-10-CM

## 2016-01-16 DIAGNOSIS — M25562 Pain in left knee: Principal | ICD-10-CM

## 2016-01-16 MED ORDER — MORPHINE SULFATE 30 MG PO TABS: ORAL_TABLET | ORAL | Status: DC

## 2016-01-16 MED FILL — MORPHINE SULFATE IR 30 MG T: 30 | 30 days supply | Qty: 120 | Fill #0

## 2016-01-16 NOTE — Telephone Encounter (Signed)
Patient wife requested and will pick up 

## 2016-01-31 ENCOUNTER — Other Ambulatory Visit: Payer: Self-pay | Admitting: *Deleted

## 2016-01-31 DIAGNOSIS — R609 Edema, unspecified: Secondary | ICD-10-CM

## 2016-01-31 DIAGNOSIS — E785 Hyperlipidemia, unspecified: Secondary | ICD-10-CM

## 2016-01-31 DIAGNOSIS — M05769 Rheumatoid arthritis with rheumatoid factor of unspecified knee without organ or systems involvement: Secondary | ICD-10-CM

## 2016-01-31 DIAGNOSIS — E1142 Type 2 diabetes mellitus with diabetic polyneuropathy: Secondary | ICD-10-CM

## 2016-02-06 ENCOUNTER — Other Ambulatory Visit: Payer: PPO

## 2016-02-06 DIAGNOSIS — M05769 Rheumatoid arthritis with rheumatoid factor of unspecified knee without organ or systems involvement: Secondary | ICD-10-CM

## 2016-02-06 DIAGNOSIS — E785 Hyperlipidemia, unspecified: Secondary | ICD-10-CM

## 2016-02-06 DIAGNOSIS — E1142 Type 2 diabetes mellitus with diabetic polyneuropathy: Secondary | ICD-10-CM

## 2016-02-06 DIAGNOSIS — R609 Edema, unspecified: Secondary | ICD-10-CM

## 2016-02-06 LAB — LIPID PANEL
Cholesterol: 150 mg/dL (ref 125–200)
HDL: 38 mg/dL — ABNORMAL LOW (ref >=40)
LDL Cholesterol: 86 mg/dL (ref <130)
Total CHOL/HDL Ratio: 3.9 Ratio (ref <=5.0)
Triglycerides: 129 mg/dL (ref <150)
VLDL: 26 mg/dL (ref <30)

## 2016-02-06 LAB — COMPLETE METABOLIC PANEL WITH GFR
ALT: 16 U/L (ref 9–46)
AST: 23 U/L (ref 10–35)
Albumin: 4 g/dL (ref 3.6–5.1)
Alkaline Phosphatase: 77 U/L (ref 40–115)
BUN: 15 mg/dL (ref 7–25)
CO2: 28 mmol/L (ref 20–31)
Calcium: 8.3 mg/dL — ABNORMAL LOW (ref 8.6–10.3)
Chloride: 104 mmol/L (ref 98–110)
Creat: 1.25 mg/dL — ABNORMAL HIGH (ref 0.70–1.18)
GFR, Est African American: 63 mL/min (ref >=60)
GFR, Est Non African American: 55 mL/min — ABNORMAL LOW (ref >=60)
Glucose, Bld: 111 mg/dL — ABNORMAL HIGH (ref 65–99)
Potassium: 4.6 mmol/L (ref 3.5–5.3)
Sodium: 142 mmol/L (ref 135–146)
Total Bilirubin: 0.8 mg/dL (ref 0.2–1.2)
Total Protein: 6.3 g/dL (ref 6.1–8.1)

## 2016-02-06 LAB — CBC WITH DIFFERENTIAL/PLATELET
Basophils Absolute: 0 cells/uL (ref 0–200)
Basophils Relative: 0 %
Eosinophils Absolute: 201 cells/uL (ref 15–500)
Eosinophils Relative: 3 %
HCT: 39.5 % (ref 38.5–50.0)
Hemoglobin: 12.8 g/dL — ABNORMAL LOW (ref 13.2–17.1)
Lymphocytes Relative: 29 %
Lymphs Abs: 1943 cells/uL (ref 850–3900)
MCH: 30.3 pg (ref 27.0–33.0)
MCHC: 32.4 g/dL (ref 32.0–36.0)
MCV: 93.4 fL (ref 80.0–100.0)
MPV: 11.3 fL (ref 7.5–12.5)
Monocytes Absolute: 469 cells/uL (ref 200–950)
Monocytes Relative: 7 %
Neutro Abs: 4087 cells/uL (ref 1500–7800)
Neutrophils Relative %: 61 %
Platelets: 112 10*3/uL — ABNORMAL LOW (ref 140–400)
RBC: 4.23 MIL/uL (ref 4.20–5.80)
RDW: 15.6 % — ABNORMAL HIGH (ref 11.0–15.0)
WBC: 6.7 10*3/uL (ref 3.8–10.8)

## 2016-02-06 LAB — HEMOGLOBIN A1C
Hgb A1c MFr Bld: 6.6 % — ABNORMAL HIGH (ref <5.7)
Mean Plasma Glucose: 143 mg/dL

## 2016-02-07 LAB — URINALYSIS
Bilirubin Urine: NEGATIVE
Glucose, UA: NEGATIVE
Hgb urine dipstick: NEGATIVE
Ketones, ur: NEGATIVE
Nitrite: POSITIVE — AB
Protein, ur: NEGATIVE
Specific Gravity, Urine: 1.011 (ref 1.001–1.035)
pH: 6 (ref 5.0–8.0)

## 2016-02-07 LAB — MICROALBUMIN, URINE: Microalb, Ur: 7.3 mg/dL

## 2016-02-08 ENCOUNTER — Encounter: Payer: Self-pay | Admitting: Internal Medicine

## 2016-02-08 ENCOUNTER — Ambulatory Visit (INDEPENDENT_AMBULATORY_CARE_PROVIDER_SITE_OTHER): Payer: PPO | Admitting: Internal Medicine

## 2016-02-08 VITALS — BP 148/82 | HR 84 | Temp 98.0°F | Ht 69.0 in | Wt 226.0 lb

## 2016-02-08 DIAGNOSIS — E1142 Type 2 diabetes mellitus with diabetic polyneuropathy: Secondary | ICD-10-CM

## 2016-02-08 DIAGNOSIS — G894 Chronic pain syndrome: Secondary | ICD-10-CM

## 2016-02-08 DIAGNOSIS — M05769 Rheumatoid arthritis with rheumatoid factor of unspecified knee without organ or systems involvement: Secondary | ICD-10-CM | POA: Diagnosis not present

## 2016-02-08 DIAGNOSIS — R609 Edema, unspecified: Secondary | ICD-10-CM

## 2016-02-08 DIAGNOSIS — R202 Paresthesia of skin: Secondary | ICD-10-CM | POA: Diagnosis not present

## 2016-02-08 DIAGNOSIS — M150 Primary generalized (osteo)arthritis: Secondary | ICD-10-CM

## 2016-02-08 DIAGNOSIS — M545 Low back pain, unspecified: Secondary | ICD-10-CM

## 2016-02-08 DIAGNOSIS — M8949 Other hypertrophic osteoarthropathy, multiple sites: Secondary | ICD-10-CM

## 2016-02-08 DIAGNOSIS — M159 Polyosteoarthritis, unspecified: Secondary | ICD-10-CM

## 2016-02-08 DIAGNOSIS — E785 Hyperlipidemia, unspecified: Secondary | ICD-10-CM | POA: Diagnosis not present

## 2016-02-08 DIAGNOSIS — E669 Obesity, unspecified: Secondary | ICD-10-CM | POA: Diagnosis not present

## 2016-02-08 DIAGNOSIS — R1314 Dysphagia, pharyngoesophageal phase: Secondary | ICD-10-CM | POA: Diagnosis not present

## 2016-02-08 MED ORDER — FUROSEMIDE 40 MG PO TABS
ORAL_TABLET | ORAL | Status: DC
Start: 2016-02-08 — End: 2016-04-06

## 2016-02-08 MED ORDER — MELOXICAM 15 MG PO TABS: ORAL_TABLET | ORAL | Status: DC

## 2016-02-08 MED FILL — MELOXICAM 15 MG TABLET: 15 | 30 days supply | Qty: 30 | Fill #0

## 2016-02-08 MED FILL — FUROSEMIDE 40 MG TABLET: 40 | 30 days supply | Qty: 60 | Fill #0

## 2016-02-08 NOTE — Progress Notes (Signed)
Patient ID: Ronnie Jackson., male   DOB: 26-Feb-1936, 80 y.o.   MRN: [id number]    Facility  [facility]    Place of Service:   OFFICE    Allergies  Allergen Reactions  . Indocin [Indomethacin]   . Methadone     Chief Complaint  Patient presents with  . Medical Management of Chronic Issues    medication management blood sugar, blood pressure, edema, cholesterol. 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 Only using the furosemide accasionally  Dysphagia, pharyngoesophageal phase - Continues with problems of swallowing and choking on various items.  Bilateral low back pain without sciatica - part of his chronic pain syndrome  Obesity (BMI 30-39.9)  - no significant weight loss. Retaining fluid.  Hyperlipemia - controlled  Diabetic polyneuropathy associated with type 2 diabetes mellitus (HCC) - none in the feet  Paresthesia - numbness and tingling in the hands. Has had previous consultations and suggested this was coming from his neck issues. He was offered surgery in the past but refused it.    Medications: Patient's Medications  New Prescriptions   No medications on file  Previous Medications   CROMOLYN (OPTICROM) 4 % OPHTHALMIC SOLUTION       FUROSEMIDE (LASIX) 40 MG TABLET    One daily to prevent fluid retention   MORPHINE (MSIR) 30 MG TABLET    Take two tablets by mouth every 12 hours as needed for pain   SULFAMETHOXAZOLE-TRIMETHOPRIM (BACTRIM DS,SEPTRA DS) 800-160 MG PER TABLET    One twice daily to control infection  Modified Medications   Modified Medication Previous Medication   MELOXICAM (MOBIC) 15 MG  TABLET meloxicam (MOBIC) 15 MG tablet      One daily to help arthritis    One daily to help arthritis  Discontinued Medications   No medications on file    Review of Systems  Constitutional: Negative for fever, chills, activity change, appetite change and fatigue.  HENT: Negative for congestion, ear pain, postnasal drip, sinus pressure, sore throat and trouble swallowing.   Eyes: Negative for visual disturbance.  Respiratory: Positive for cough. Negative for chest tightness and shortness of breath.   Cardiovascular: Negative for chest pain, palpitations and leg swelling.  Gastrointestinal: Negative for nausea, vomiting, abdominal pain and blood in stool.  Genitourinary: Negative for urgency, frequency and difficulty urinating.       History of rectovesical fistula. Recurrent urinary tract infections.  Musculoskeletal: Positive for myalgias, back pain, arthralgias, neck pain and neck stiffness. Negative for gait problem.  Skin: Negative for rash.  Neurological: Positive for numbness (In both feet). Negative for dizziness, weakness and headaches.       Paresthesias in hands and lower forearms likely related to known cervical spine disease.  Psychiatric/Behavioral: Positive for sleep disturbance. Negative for confusion. The patient is not nervous/anxious.     Filed Vitals:   02/08/16 1351  BP: 148/82  Pulse: 84  Temp: 98 F (36.7 C)  TempSrc: Oral  Height: '5\' 9"'$  (1.753 m)  Weight: 226 lb (102.513 kg)  SpO2: 97%   Body mass index is 33.36 kg/(m^2). Wt Readings from Last 3 Encounters:  02/08/16 226  lb (102.513 kg)  10/05/15 231 lb (104.781 kg)  07/05/15 219 lb 6.4 oz (99.519 kg)      Physical Exam  Constitutional: He is oriented to person, place, and time. He appears well-developed and well-nourished. No distress.  Overweight  HENT:  Right Ear: External ear normal.  Left Ear: External ear normal.  Nose: Nose normal.  Mouth/Throat: Oropharynx is clear and moist. No  oropharyngeal exudate.  Significant partial hearing loss  Eyes: Conjunctivae and EOM are normal. Pupils are equal, round, and reactive to light.  Neck: No JVD present. No tracheal deviation present. No thyromegaly present.  Cardiovascular: Normal rate, regular rhythm, normal heart sounds and intact distal pulses.  Exam reveals no gallop and no friction rub.   No murmur heard. Pulmonary/Chest: No respiratory distress. He has no wheezes. He has rales (right lower lobe posteriorly). He exhibits no tenderness.  Abdominal: He exhibits no distension and no mass. There is no tenderness.  Musculoskeletal: He exhibits edema (Increased lower leg edema with the left leg greater than the right) and tenderness.  Multiple painful areas including the neck, shoulders, back, hips, and knees. Diminished range of motion at the shoulders. Increased pain with flexion, extension, rotation of the back Increased pain with abduction or flexion of the right hip. Pain with palpation of the groin area. No bulge. Pain with extension of either knee associated with crepitus.  Lymphadenopathy:    He has no cervical adenopathy.  Neurological: He is alert and oriented to person, place, and time. He has normal reflexes. No cranial nerve deficit. Coordination normal.  Decreased sensation to Vibration and monofilament  Skin: No rash noted. No erythema. No pallor.  Psychiatric: He has a normal mood and affect. His behavior is normal. Judgment and thought content normal.    Labs reviewed: Lab Summary Latest Ref Rng 02/06/2016 07/01/2015  Hemoglobin 13.2 - 17.1 g/dL 12.8(L) 12.9  Hematocrit 38.5 - 50.0 % 39.5 39.0  White count 3.8 - 10.8 K/uL 6.7 6.4  Platelet count 140 - 400 K/uL 112(L) (None)  Sodium 135 - 146 mmol/L 142 139  Potassium 3.5 - 5.3 mmol/L 4.6 3.9  Calcium 8.6 - 10.3 mg/dL 8.3(L) 8.7  Phosphorus - (None) (None)  Creatinine 0.70 - 1.18 mg/dL 1.25(H) 1.31(H)  AST 10 - 35 U/L 23 16  Alk Phos 40 - 115 U/L 77 72    Bilirubin 0.2 - 1.2 mg/dL 0.8 0.8  Glucose 65 - 99 mg/dL 111(H) 102(H)  Cholesterol 125 - 200 mg/dL 150 (None)  HDL cholesterol >=40 mg/dL 38(L) 33(L)  Triglycerides <150 mg/dL 129 164(H)  LDL Direct - (None) (None)  LDL Calc <130 mg/dL 86 95  Total protein 6.1 - 8.1 g/dL 6.3 (None)  Albumin 3.6 - 5.1 g/dL 4.0 4.3   No results found for: TSH, T3TOTAL, T4TOTAL, THYROIDAB Lab Results  Component Value Date   BUN 15 02/06/2016   BUN 13 07/01/2015   BUN 15 03/25/2015   Lab Results  Component Value Date   HGBA1C 6.6* 02/06/2016   HGBA1C 6.5* 03/25/2015   HGBA1C 6.3* 12/22/2014    Assessment/Plan  1. 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 Primary osteoarthritis involving multiple joints - meloxicam (MOBIC) 15 MG tablet; One daily to help arthritis  Dispense: 30 tablet; Refill: 5  3. Chronic pain syndrome Continue on current medication  4. DM type 2 with  diabetic peripheral neuropathy (HCC) - Basic metabolic panel; Future  5. Edema, unspecified type -urine was nitrite positive, but e is asymptomatic - furosemide (LASIX) 40 MG tablet; One twice daily to prevent fluid retention.  Dispense: 60 tablet; Refill: 5 - Basic metabolic panel; Future  6. Dysphagia, pharyngoesophageal phase Patient does not feel he needs to gastroenterologist yet  7. Bilateral low back pain without sciatica Continue current pain medications  8. Obesity (BMI 30-39.9) Again advised weight loss  9. Hyperlipemia Controlled  10. Diabetic polyneuropathy associated with type 2 diabetes mellitus (HCC) Chronic  11. Paresthesia 2 possible etiologies: Diabetic related or cervical spine disease.

## 2016-02-15 ENCOUNTER — Other Ambulatory Visit: Payer: Self-pay | Admitting: *Deleted

## 2016-02-15 DIAGNOSIS — M25561 Pain in right knee: Secondary | ICD-10-CM

## 2016-02-15 DIAGNOSIS — G894 Chronic pain syndrome: Secondary | ICD-10-CM

## 2016-02-15 DIAGNOSIS — M25562 Pain in left knee: Principal | ICD-10-CM

## 2016-02-15 MED ORDER — MORPHINE SULFATE 30 MG PO TABS: ORAL_TABLET | ORAL | 0 refills | Status: DC

## 2016-02-15 MED FILL — MORPHINE SULFATE IR 30 MG T: 30 | 30 days supply | Qty: 120 | Fill #0

## 2016-02-15 NOTE — Telephone Encounter (Signed)
Patient wife, Ronnie Jackson requested and will pick up

## 2016-02-19 IMAGING — CR DG KNEE COMPLETE 4+V*L*
4 series · 4 of 4 positions shown · non-contrast
Comparison: [facility] July 26, 2014 and lumbar spine MRI [DATE]

CLINICAL DATA: Worsening of chronic pain in the lumbar spine, right
hip, and left knee; symptoms progressive over the past year, no
history of trauma

EXAM:
LEFT KNEE - COMPLETE 4+ VIEW; LUMBAR SPINE - COMPLETE 4+ VIEW; DG
HIP (WITH OR WITHOUT PELVIS) 2-3V RIGHT

[w knee ap left]
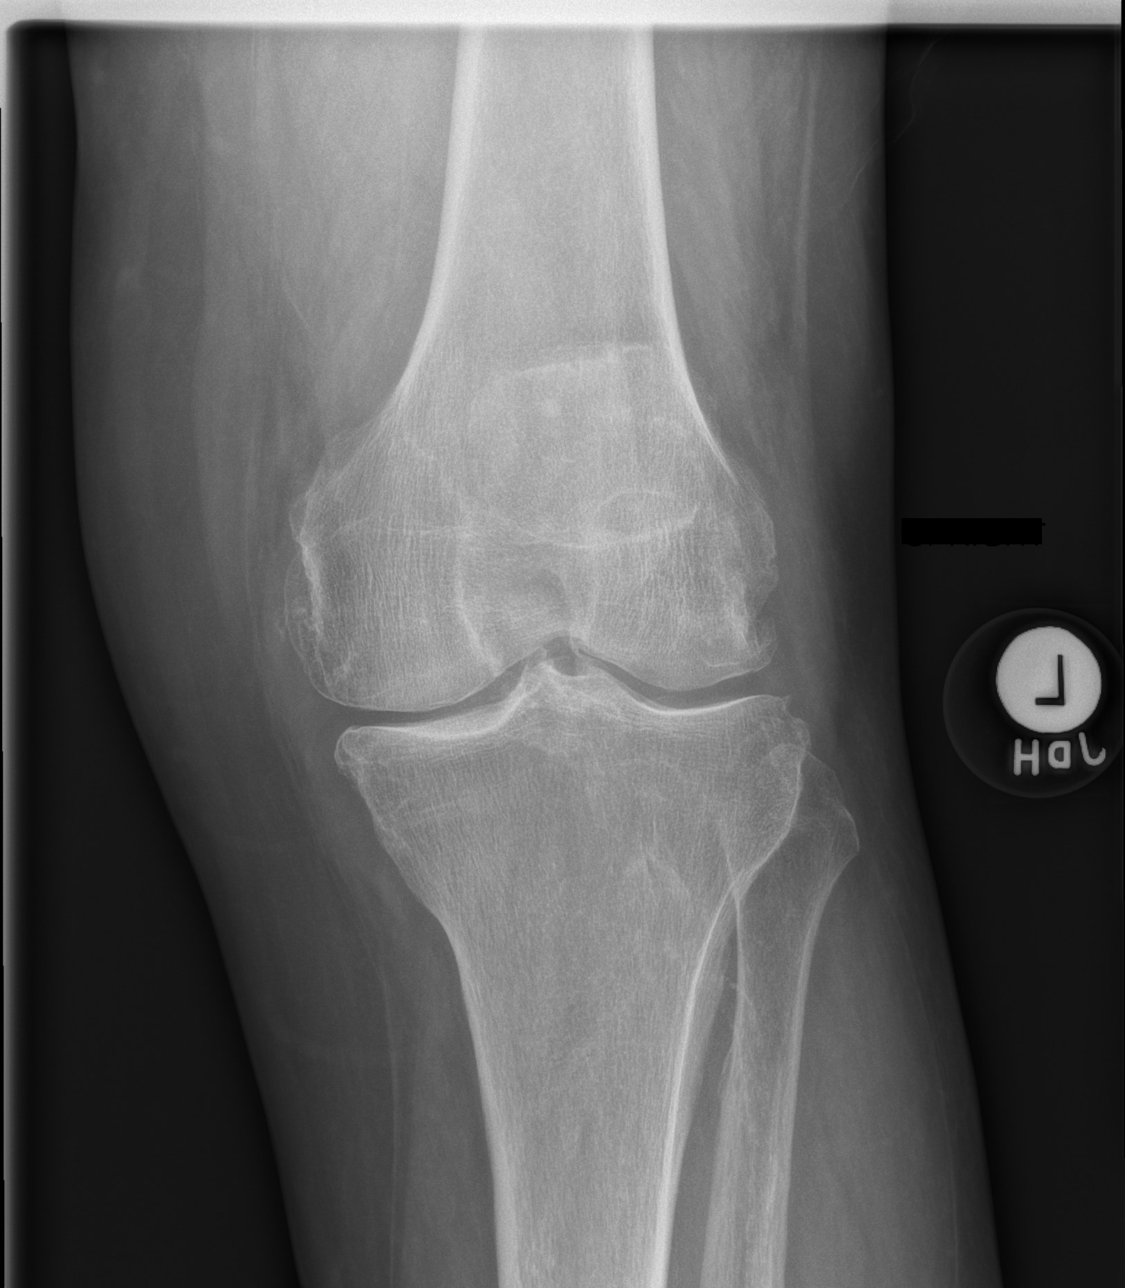

[w knee lat left]
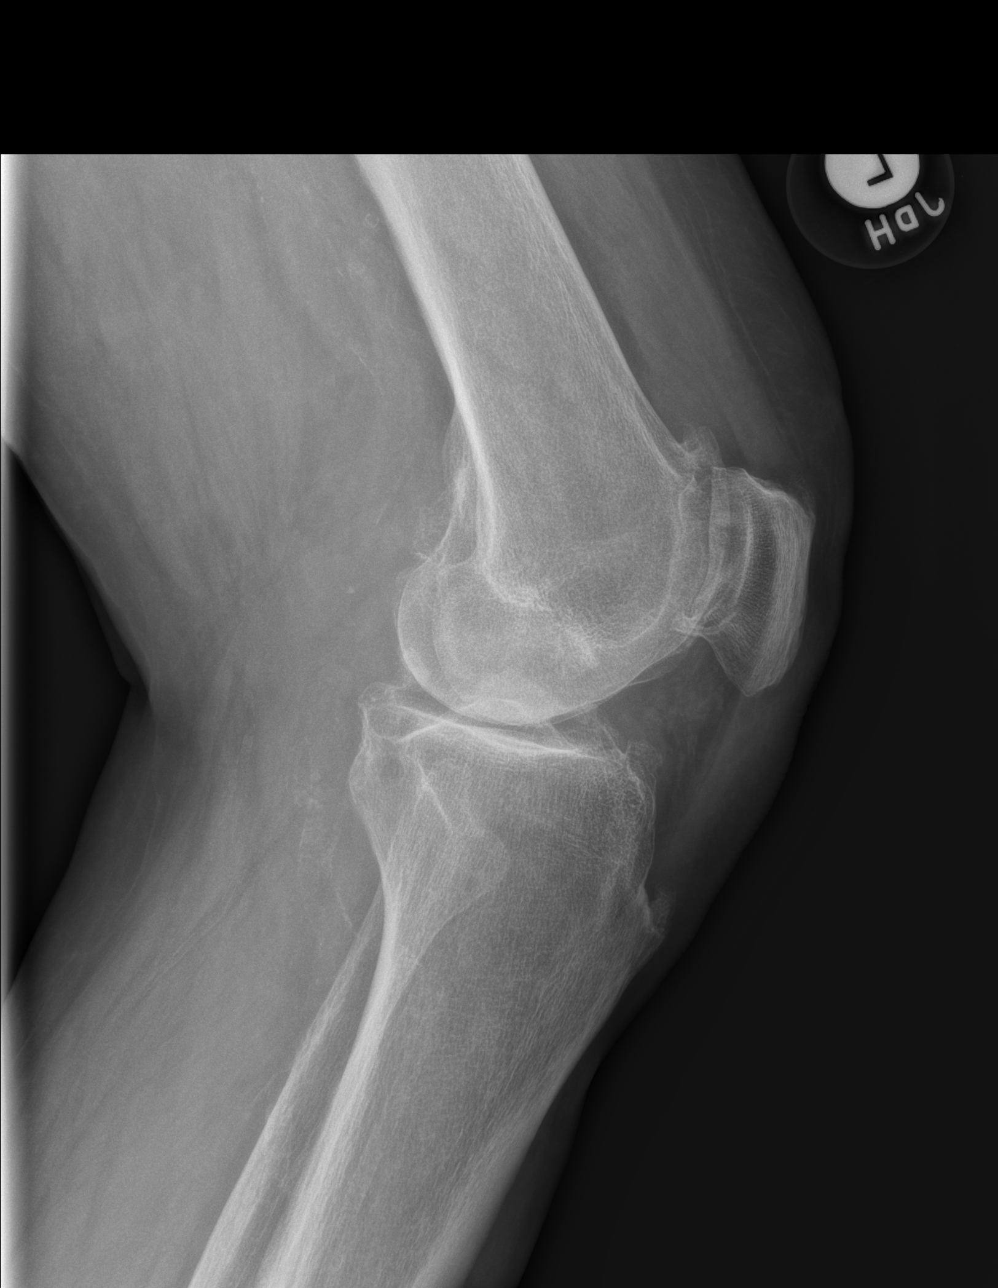

[x knee tunnel left]
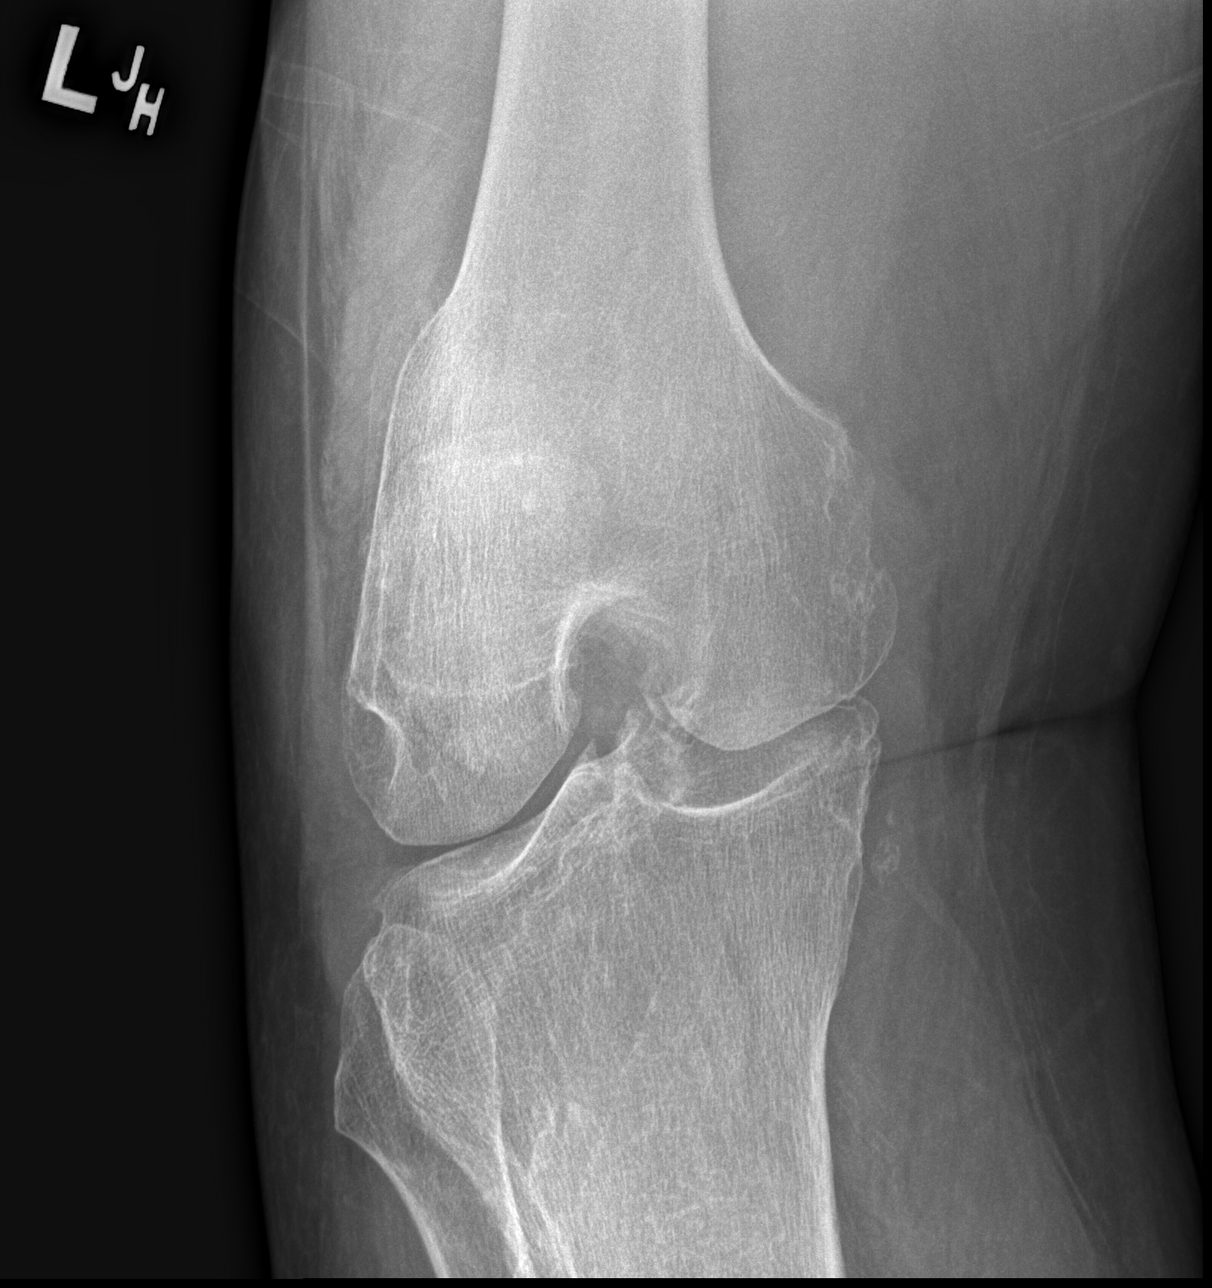

[x knee sunrise left]
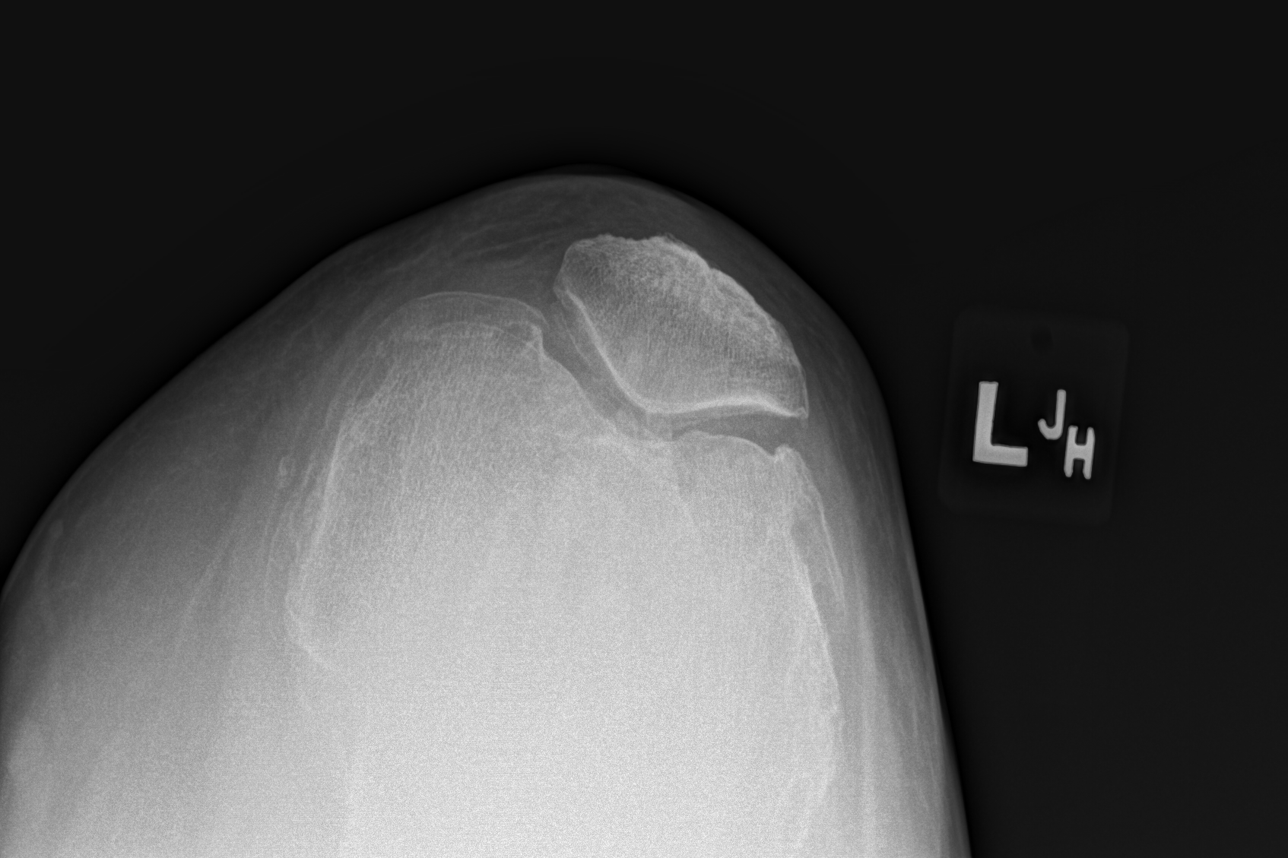

[4 of 4 positions shown; findings below may reference images not displayed]

FINDINGS: Lumbar spine: The lumbar vertebral bodies are preserved in height.
There are large anterior bridging and near bridging osteophytes
throughout the lumbar spine. There is no spondylolisthesis. The disc
space heights are well maintained. There is facet joint hypertrophy
at L4-5 and at L5-S1. The pedicles and transverse processes are
intact.

Right hip: The bony pelvis is adequately mineralized. The sacrum and
SI joints are unremarkable. The pubic rami are intact. There are
numerous vascular clips in the pelvis from previous radical
prostatectomy. The right hip reveals mild symmetric narrowing of the
joint space. There is subtle irregularity of the articular surfaces
of the medial aspect of the acetabulum and of the femoral head. The
femoral neck, intertrochanteric, and subtrochanteric regions are
normal.

Left knee: There is narrowing of the medial and lateral and
patellofemoral joint compartments. There is beaking of the tibial
spines. Spurs arise from the articular margins of the lateral tibial
plateau and lateral femoral condyles from the articular margins of
the patella. There is no acute fracture nor dislocation. There is no
chondrocalcinosis or joint effusion. There is calcification of the
popliteal artery.
IMPRESSION: 1. Prominent anterior and right lateral endplate osteophytes. There
is no compression fracture nor disc space narrowing.
2. Mild to moderate osteoarthritic change of the right hip joint. No
acute fracture.
3. There are moderate osteoarthritic changes of all 3 joint
compartments. There is no acute bony abnormality.

## 2016-02-19 IMAGING — CR DG LUMBAR SPINE COMPLETE 4+V
5 series · 5 of 5 positions shown · non-contrast
Comparison: [facility] July 26, 2014 and lumbar spine MRI [DATE]

CLINICAL DATA: Worsening of chronic pain in the lumbar spine, right
hip, and left knee; symptoms progressive over the past year, no
history of trauma

EXAM:
LEFT KNEE - COMPLETE 4+ VIEW; LUMBAR SPINE - COMPLETE 4+ VIEW; DG
HIP (WITH OR WITHOUT PELVIS) 2-3V RIGHT

[w lumbar spine ap]
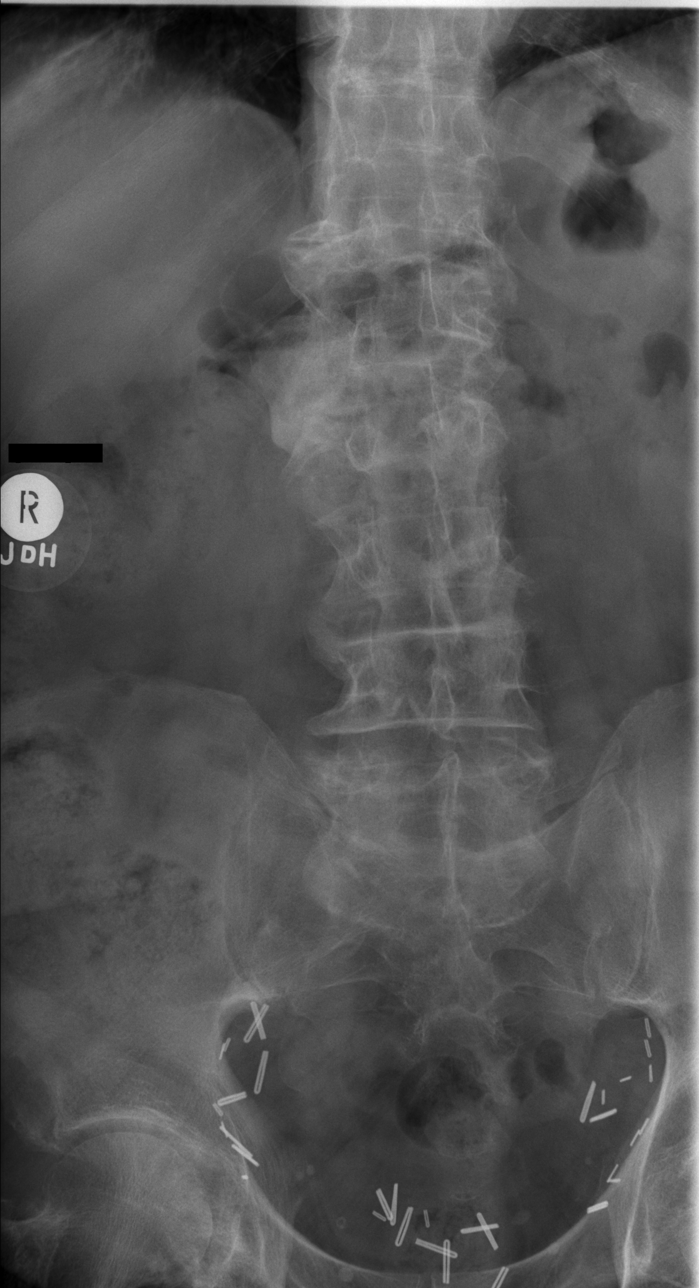

[w lumbar spine obl (1 of 2)]
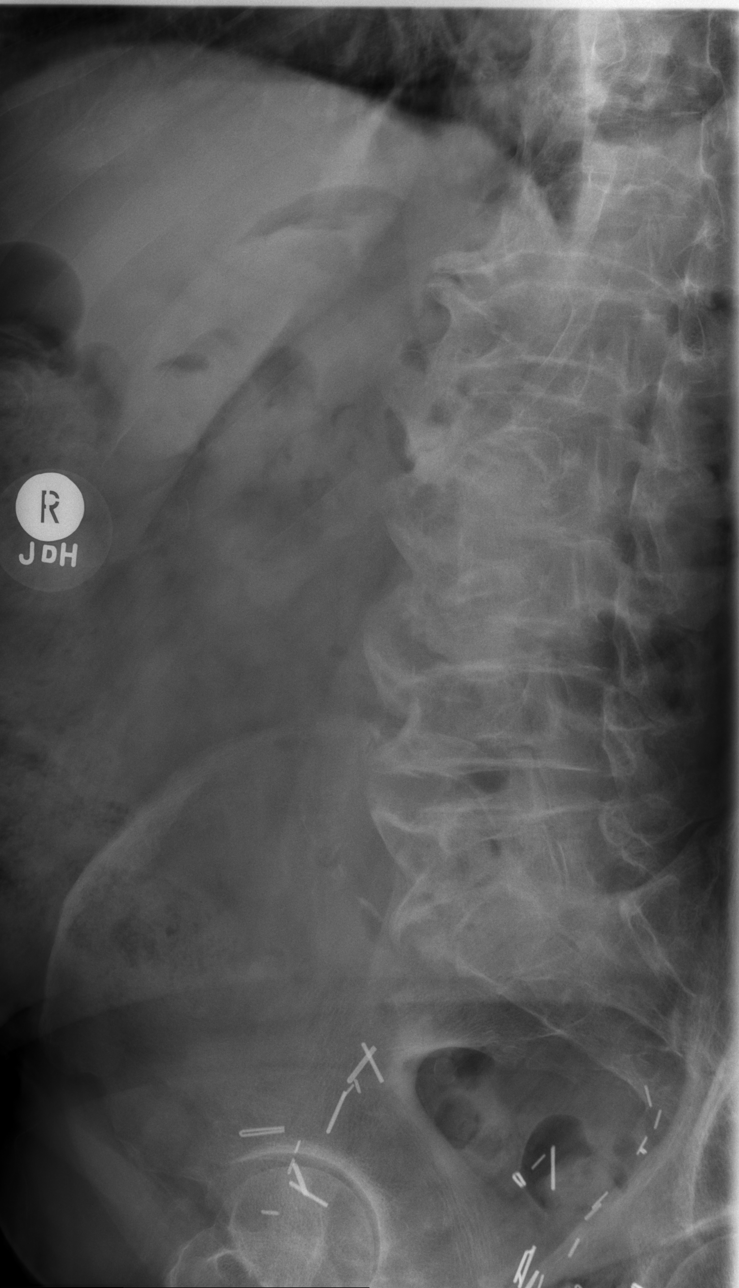

[w lumbar spine obl (2 of 2)]
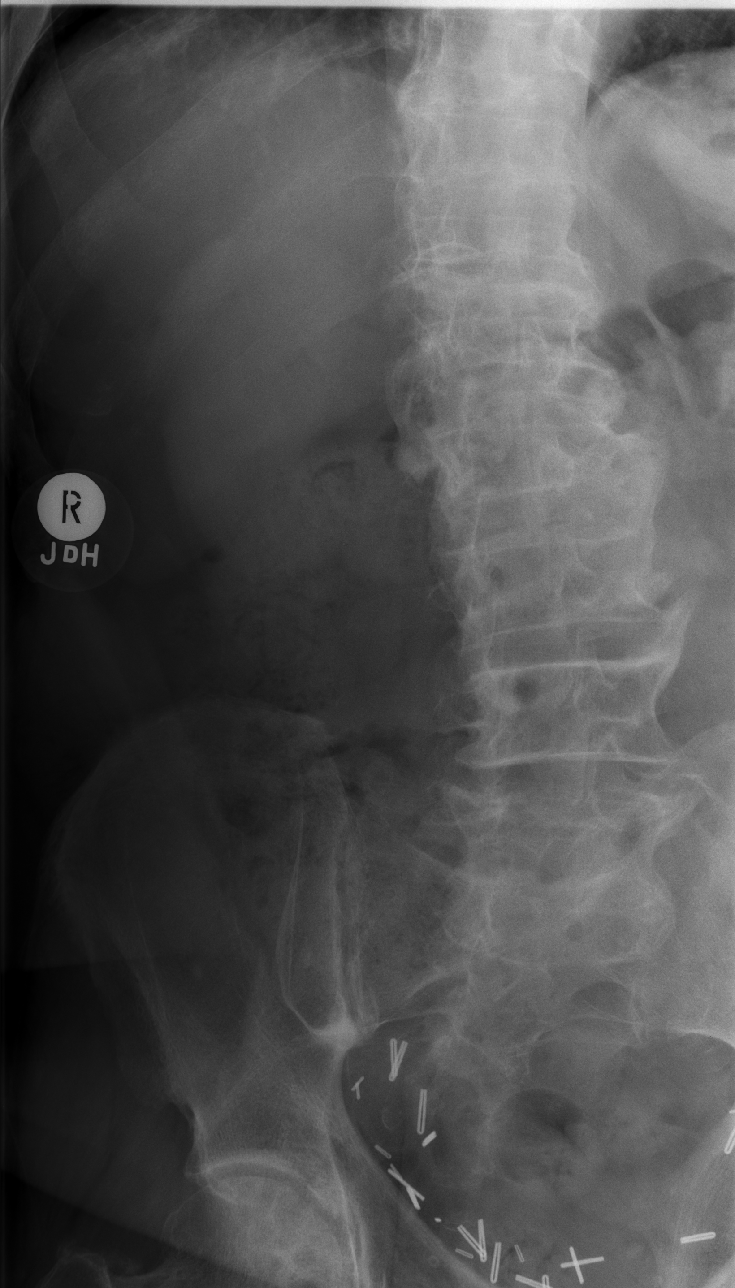

[w lumbar spine lat]
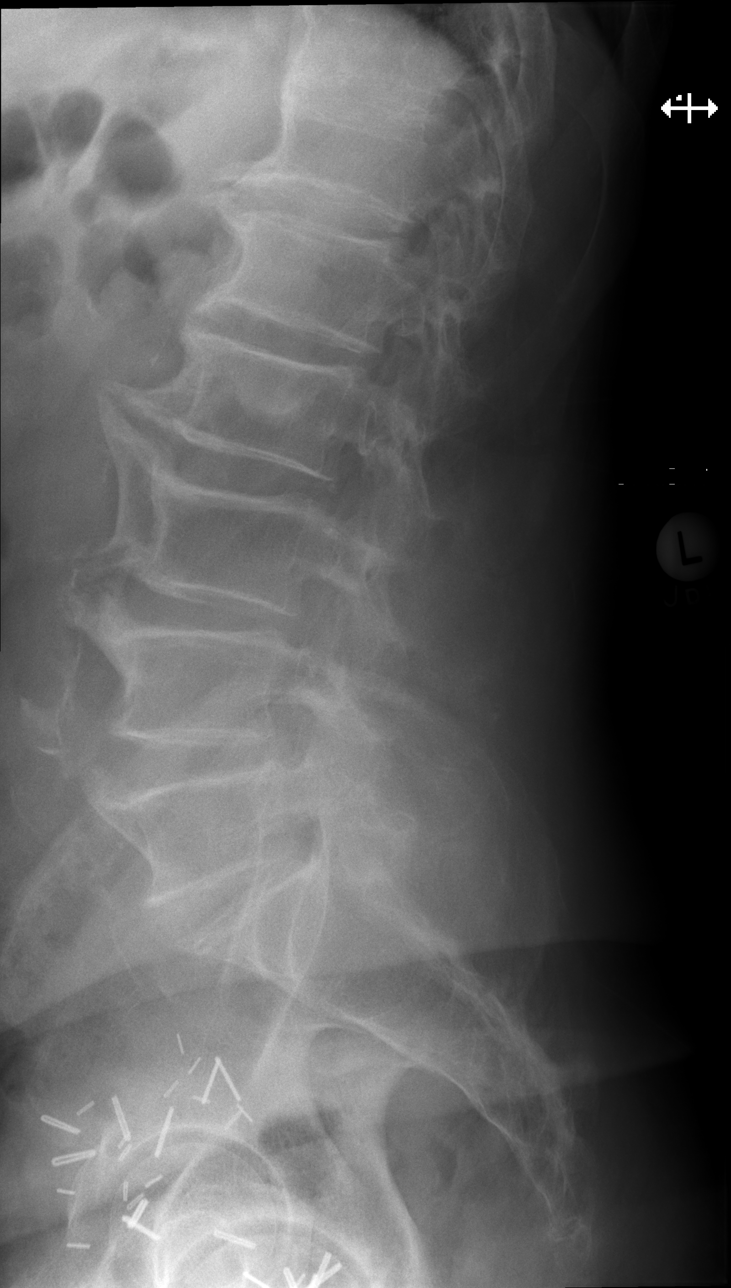

[w lumbar l-5 s-1 spot]
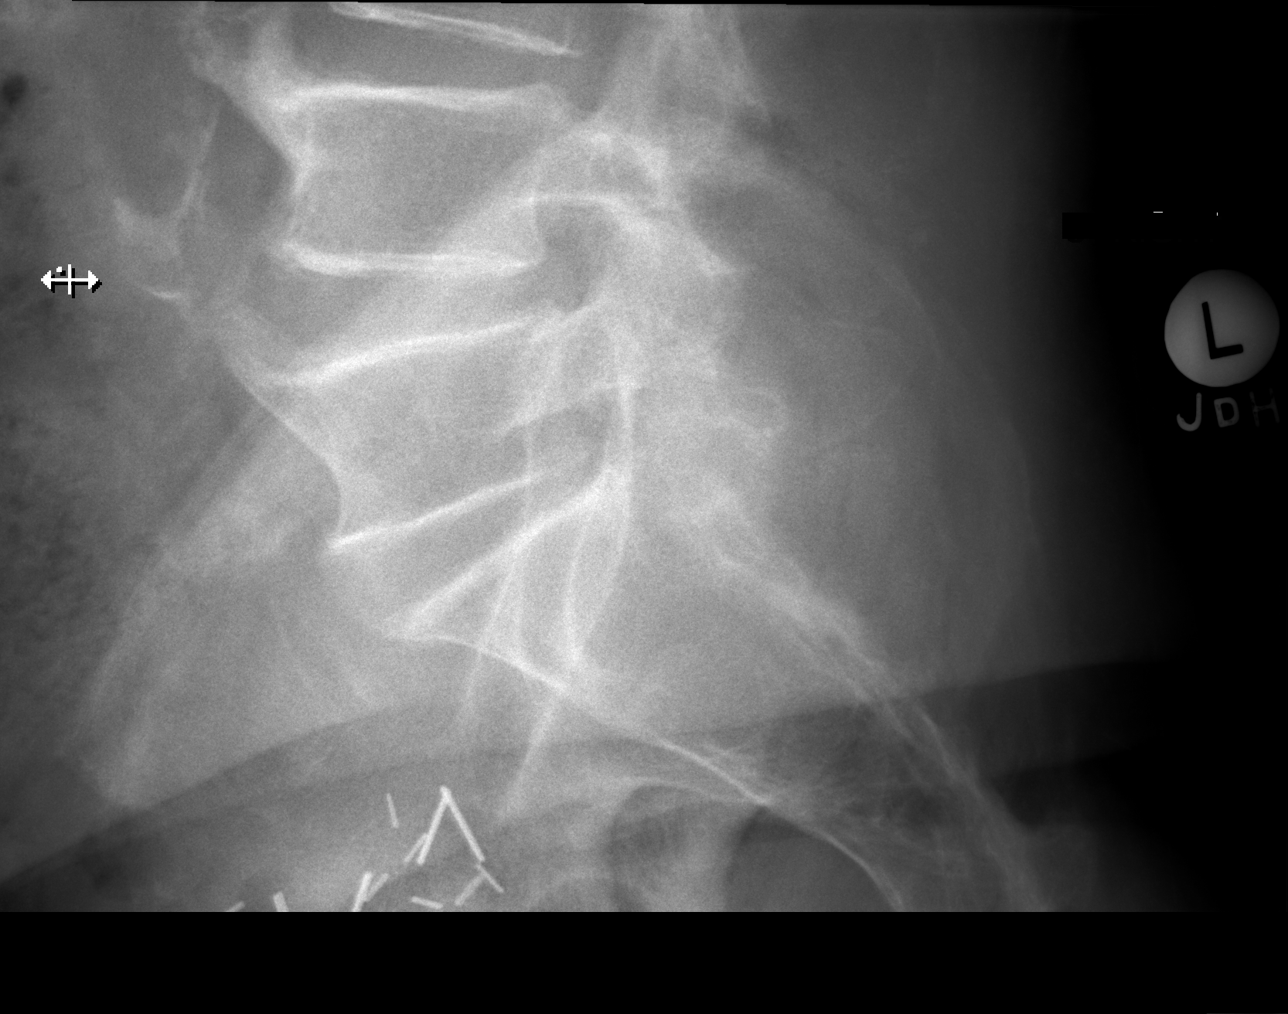

[5 of 5 positions shown; findings below may reference images not displayed]

FINDINGS: Lumbar spine: The lumbar vertebral bodies are preserved in height.
There are large anterior bridging and near bridging osteophytes
throughout the lumbar spine. There is no spondylolisthesis. The disc
space heights are well maintained. There is facet joint hypertrophy
at L4-5 and at L5-S1. The pedicles and transverse processes are
intact.

Right hip: The bony pelvis is adequately mineralized. The sacrum and
SI joints are unremarkable. The pubic rami are intact. There are
numerous vascular clips in the pelvis from previous radical
prostatectomy. The right hip reveals mild symmetric narrowing of the
joint space. There is subtle irregularity of the articular surfaces
of the medial aspect of the acetabulum and of the femoral head. The
femoral neck, intertrochanteric, and subtrochanteric regions are
normal.

Left knee: There is narrowing of the medial and lateral and
patellofemoral joint compartments. There is beaking of the tibial
spines. Spurs arise from the articular margins of the lateral tibial
plateau and lateral femoral condyles from the articular margins of
the patella. There is no acute fracture nor dislocation. There is no
chondrocalcinosis or joint effusion. There is calcification of the
popliteal artery.
IMPRESSION: 1. Prominent anterior and right lateral endplate osteophytes. There
is no compression fracture nor disc space narrowing.
2. Mild to moderate osteoarthritic change of the right hip joint. No
acute fracture.
3. There are moderate osteoarthritic changes of all 3 joint
compartments. There is no acute bony abnormality.

## 2016-03-02 ENCOUNTER — Other Ambulatory Visit: Payer: PPO

## 2016-03-02 DIAGNOSIS — R7989 Other specified abnormal findings of blood chemistry: Secondary | ICD-10-CM

## 2016-03-02 DIAGNOSIS — R609 Edema, unspecified: Secondary | ICD-10-CM

## 2016-03-02 DIAGNOSIS — E1142 Type 2 diabetes mellitus with diabetic polyneuropathy: Secondary | ICD-10-CM

## 2016-03-02 DIAGNOSIS — R799 Abnormal finding of blood chemistry, unspecified: Secondary | ICD-10-CM

## 2016-03-02 LAB — BASIC METABOLIC PANEL WITH GFR
BUN: 22 mg/dL (ref 7–25)
CO2: 26 mmol/L (ref 20–31)
Calcium: 8.1 mg/dL — ABNORMAL LOW (ref 8.6–10.3)
Chloride: 103 mmol/L (ref 98–110)
Creat: 1.39 mg/dL — ABNORMAL HIGH (ref 0.70–1.18)
GFR, Est African American: 56 mL/min — ABNORMAL LOW (ref >=60)
GFR, Est Non African American: 48 mL/min — ABNORMAL LOW (ref >=60)
Glucose, Bld: 109 mg/dL — ABNORMAL HIGH (ref 65–99)
Potassium: 3.8 mmol/L (ref 3.5–5.3)
Sodium: 140 mmol/L (ref 135–146)

## 2016-03-06 ENCOUNTER — Encounter: Payer: Self-pay | Admitting: Internal Medicine

## 2016-03-06 ENCOUNTER — Ambulatory Visit (INDEPENDENT_AMBULATORY_CARE_PROVIDER_SITE_OTHER): Payer: PPO | Admitting: Internal Medicine

## 2016-03-06 VITALS — BP 132/78 | HR 76 | Temp 98.0°F | Resp 17 | Ht 69.0 in | Wt 227.2 lb

## 2016-03-06 DIAGNOSIS — I4891 Unspecified atrial fibrillation: Secondary | ICD-10-CM | POA: Diagnosis not present

## 2016-03-06 DIAGNOSIS — I4819 Other persistent atrial fibrillation: Secondary | ICD-10-CM | POA: Insufficient documentation

## 2016-03-06 DIAGNOSIS — R609 Edema, unspecified: Secondary | ICD-10-CM

## 2016-03-06 HISTORY — DX: Unspecified atrial fibrillation: I48.91

## 2016-03-06 MED ORDER — METOLAZONE 5 MG PO TABS: ORAL_TABLET | ORAL | 5 refills | Status: DC

## 2016-03-06 MED ORDER — SULFAMETHOXAZOLE-TRIMETHOPRIM 800-160 MG PO TABS: ORAL_TABLET | ORAL | 5 refills | Status: DC

## 2016-03-06 MED ORDER — APIXABAN 5 MG PO TABS: ORAL_TABLET | ORAL | 5 refills | Status: DC

## 2016-03-06 NOTE — Progress Notes (Signed)
Facility  [facility]    Place of Service:   OFFICE    Allergies  Allergen Reactions  . Indocin [Indomethacin]   . Methadone     Chief Complaint  Patient presents with  . Medical Management of Chronic Issues    4 week routine follow up  . Other    Discuss bactrimrefill    HPI:   This patient was last seen 02/08/2016. Problems evaluated at that time included his rheumatoid arthritis, osteoarthritis, chronic pain syndrome, DM 2 with peripheral neuropathy, edema, dysphagia, low back pain, obesity, hyperlipidemia, paresthesias of the hands.  He felt like his edema had been getting worse. Lasix was dispensed 40 mg twice daily with a follow-up BMP. Despite the increase in Lasix, he's gained 1 pound since he was last year.  Medications: Patient's Medications  New Prescriptions   No medications on file  Previous Medications   CROMOLYN (OPTICROM) 4 % OPHTHALMIC SOLUTION       FUROSEMIDE (LASIX) 40 MG TABLET    One twice daily to prevent fluid retention.   MELOXICAM (MOBIC) 15 MG TABLET    One daily to help arthritis   MORPHINE (MSIR) 30 MG TABLET    Take two tablets by mouth every 12 hours as needed for pain   SULFAMETHOXAZOLE-TRIMETHOPRIM (BACTRIM DS,SEPTRA DS) 800-160 MG PER TABLET    One twice daily to control infection  Modified Medications   No medications on file  Discontinued Medications   No medications on file    Review of Systems  Constitutional: Positive for fatigue. Negative for activity change, appetite change, chills and fever.  HENT: Negative for congestion, ear pain, postnasal drip, sinus pressure, sore throat and trouble swallowing.   Eyes: Negative for visual disturbance.  Respiratory: Positive for cough. Negative for chest tightness and shortness of breath.   Cardiovascular: Positive for leg swelling. Negative for chest pain and palpitations.  Gastrointestinal: Negative for abdominal pain, blood in stool, nausea and vomiting.  Genitourinary: Negative for  difficulty urinating, frequency and urgency.       History of rectovesical fistula. Recurrent urinary tract infections.  Musculoskeletal: Positive for arthralgias, back pain, myalgias, neck pain and neck stiffness. Negative for gait problem.  Skin: Negative for rash.  Neurological: Positive for numbness (In both feet). Negative for dizziness, weakness and headaches.       Paresthesias in hands and lower forearms likely related to known cervical spine disease.  Psychiatric/Behavioral: Positive for sleep disturbance. Negative for confusion. The patient is not nervous/anxious.     Vitals:   03/06/16 1629  BP: 132/78  Pulse: 76  Resp: 17  Temp: 98 F (36.7 C)  TempSrc: Oral  SpO2: 97%  Weight: 227 lb 3.2 oz (103.1 kg)  Height: '5\' 9"'$  (1.753 m)   Body mass index is 33.55 kg/m. Wt Readings from Last 3 Encounters:  03/06/16 227 lb 3.2 oz (103.1 kg)  02/08/16 226 lb (102.5 kg)  10/05/15 231 lb (104.8 kg)      Physical Exam  Constitutional: He is oriented to person, place, and time. He appears well-developed and well-nourished. No distress.  Overweight  HENT:  Right Ear: External ear normal.  Left Ear: External ear normal.  Nose: Nose normal.  Mouth/Throat: Oropharynx is clear and moist. No oropharyngeal exudate.  Significant partial hearing loss  Eyes: Conjunctivae and EOM are normal. Pupils are equal, round, and reactive to light.  Neck: No JVD present. No tracheal deviation present. No thyromegaly present.  Cardiovascular: Normal rate and  intact distal pulses.  Exam reveals no gallop and no friction rub.   No murmur heard. New finding on 03/06/16: Atrial fib. Rate controlled at 67 BPM.  Pulmonary/Chest: No respiratory distress. He has no wheezes. He has rales (right lower lobe posteriorly). He exhibits no tenderness.  Abdominal: He exhibits no distension and no mass. There is no tenderness.  Musculoskeletal: He exhibits edema (Increased lower leg edema with the left leg  greater than the right) and tenderness.  Multiple painful areas including the neck, shoulders, back, hips, and knees. Diminished range of motion at the shoulders. Increased pain with flexion, extension, rotation of the back Increased pain with abduction or flexion of the right hip. Pain with palpation of the groin area. No bulge. Pain with extension of either knee associated with crepitus.  Lymphadenopathy:    He has no cervical adenopathy.  Neurological: He is alert and oriented to person, place, and time. He has normal reflexes. No cranial nerve deficit. Coordination normal.  Decreased sensation to Vibration and monofilament  Skin: No rash noted. No erythema. No pallor.  Psychiatric: He has a normal mood and affect. His behavior is normal. Judgment and thought content normal.    Labs reviewed: Lab Summary Latest Ref Rng & Units 03/02/2016 02/06/2016 07/01/2015  Hemoglobin 13.2 - 17.1 g/dL (None) 12.8(L) 12.9  Hematocrit 38.5 - 50.0 % (None) 39.5 39.0  White count 3.8 - 10.8 K/uL (None) 6.7 6.4  Platelet count 140 - 400 K/uL (None) 112(L) (None)  Sodium 135 - 146 mmol/L 140 142 139  Potassium 3.5 - 5.3 mmol/L 3.8 4.6 3.9  Calcium 8.6 - 10.3 mg/dL 8.1(L) 8.3(L) 8.7  Phosphorus - (None) (None) (None)  Creatinine 0.70 - 1.18 mg/dL 1.39(H) 1.25(H) 1.31(H)  AST 10 - 35 U/L (None) 23 16  Alk Phos 40 - 115 U/L (None) 77 72  Bilirubin 0.2 - 1.2 mg/dL (None) 0.8 0.8  Glucose 65 - 99 mg/dL 109(H) 111(H) 102(H)  Cholesterol 125 - 200 mg/dL (None) 150 (None)  HDL cholesterol >=40 mg/dL (None) 38(L) 33(L)  Triglycerides <150 mg/dL (None) 129 164(H)  LDL Direct - (None) (None) (None)  LDL Calc <130 mg/dL (None) 86 95  Total protein 6.1 - 8.1 g/dL (None) 6.3 (None)  Albumin 3.6 - 5.1 g/dL (None) 4.0 4.3  Some recent data might be hidden   No results found for: TSH, T3TOTAL, T4TOTAL, THYROIDAB Lab Results  Component Value Date   BUN 22 03/02/2016   BUN 15 02/06/2016   BUN 13 07/01/2015    Lab Results  Component Value Date   HGBA1C 6.6 (H) 02/06/2016   HGBA1C 6.5 (H) 03/25/2015   HGBA1C 6.3 (H) 12/22/2014    Assessment/Plan  1. Edema, unspecified type Continue furosemide 40 mg twice daily - metolazone (ZAROXOLYN) 5 MG tablet; One each morning to help prevent edema  Dispense: 30 tablet; Refill: 5 - Basic metabolic panel; Future  2. Atrial fibrillation, unspecified type [facility]) Patient education materials were given. - apixaban (ELIQUIS) 5 MG TABS tablet; One twice daily to help prevent stroke related to atrial fibrillation  Dispense: 60 tablet; Refill: 5

## 2016-03-06 NOTE — Patient Instructions (Signed)
Take metolazone in addition to the Furosemide.

## 2016-03-07 MED FILL — SULFAMETHOXAZOLE/TMP DS TAB: 800-160 | 30 days supply | Qty: 60 | Fill #0

## 2016-03-07 MED FILL — metOLazone 5 MG TABS: 5 | 30 days supply | Qty: 30 | Fill #0

## 2016-03-07 MED FILL — ELIQUIS 5 MG TABLET: 5 | 30 days supply | Qty: 60 | Fill #0

## 2016-03-07 NOTE — Addendum Note (Signed)
Addended by: Ripley Fraise on: 03/07/2016 08:45 AM   Modules accepted: Orders

## 2016-03-09 NOTE — Addendum Note (Signed)
Addended by: Logan Bores on: 03/09/2016 03:12 PM   Modules accepted: Orders

## 2016-03-15 ENCOUNTER — Other Ambulatory Visit: Payer: PPO

## 2016-03-15 DIAGNOSIS — R609 Edema, unspecified: Secondary | ICD-10-CM | POA: Diagnosis not present

## 2016-03-15 LAB — BASIC METABOLIC PANEL
BUN: 35 mg/dL — ABNORMAL HIGH (ref 7–25)
CO2: 30 mmol/L (ref 20–31)
Calcium: 8.9 mg/dL (ref 8.6–10.3)
Chloride: 96 mmol/L — ABNORMAL LOW (ref 98–110)
Creat: 1.91 mg/dL — ABNORMAL HIGH (ref 0.70–1.18)
Glucose, Bld: 112 mg/dL — ABNORMAL HIGH (ref 65–99)
Potassium: 3.2 mmol/L — ABNORMAL LOW (ref 3.5–5.3)
Sodium: 135 mmol/L (ref 135–146)

## 2016-03-20 ENCOUNTER — Encounter: Payer: Self-pay | Admitting: Internal Medicine

## 2016-03-20 ENCOUNTER — Ambulatory Visit (INDEPENDENT_AMBULATORY_CARE_PROVIDER_SITE_OTHER): Payer: PPO | Admitting: Internal Medicine

## 2016-03-20 VITALS — BP 142/82 | HR 77 | Temp 98.2°F | Ht 69.0 in | Wt 226.0 lb

## 2016-03-20 DIAGNOSIS — M25562 Pain in left knee: Secondary | ICD-10-CM

## 2016-03-20 DIAGNOSIS — R609 Edema, unspecified: Secondary | ICD-10-CM

## 2016-03-20 DIAGNOSIS — G894 Chronic pain syndrome: Secondary | ICD-10-CM

## 2016-03-20 DIAGNOSIS — M25561 Pain in right knee: Secondary | ICD-10-CM

## 2016-03-20 DIAGNOSIS — E876 Hypokalemia: Secondary | ICD-10-CM | POA: Diagnosis not present

## 2016-03-20 DIAGNOSIS — I4891 Unspecified atrial fibrillation: Secondary | ICD-10-CM

## 2016-03-20 MED ORDER — POTASSIUM CHLORIDE CRYS ER 20 MEQ PO TBCR: EXTENDED_RELEASE_TABLET | ORAL | 3 refills | Status: DC

## 2016-03-20 MED ORDER — MORPHINE SULFATE 30 MG PO TABS: ORAL_TABLET | ORAL | 0 refills | Status: DC

## 2016-03-20 MED FILL — MELOXICAM 15 MG TABLET: 15 | 30 days supply | Qty: 30 | Fill #1

## 2016-03-20 MED FILL — KLOR-CON M20 TABLET: 20 | 90 days supply | Qty: 90 | Fill #0

## 2016-03-20 MED FILL — MORPHINE SULFATE IR 30 MG T: 30 | 30 days supply | Qty: 120 | Fill #0

## 2016-03-20 NOTE — Progress Notes (Signed)
Facility  [facility]    Place of Service:   OFFICE    Allergies  Allergen Reactions  . Indocin [Indomethacin]   . Methadone     Chief Complaint  Patient presents with  . Medical Management of Chronic Issues    2 week follow-up on A-Fib, edema, review labs. Here with wife    HPI:  Patient was last seen 03/06/2016. On that date he had a new diagnosis of atrial flutter. He was started on Eliquis.  The preceding visit was on 02/08/2016. He was having trouble with edema. Patient's weight continues to be the same and edema continues despite having him on furosemide and adding metolazone at the time of his last visit 03/06/2016. Despite the weight being the same, he has less swelling of the legs and ankles.   BUN and creatinine are rising since being on the combination of metolazone and furosemide. Potassium is slightly low.    Medications: Patient's Medications  New Prescriptions   No medications on file  Previous Medications   APIXABAN (ELIQUIS) 5 MG TABS TABLET    One twice daily to help prevent stroke related to atrial fibrillation   CROMOLYN (OPTICROM) 4 % OPHTHALMIC SOLUTION       FUROSEMIDE (LASIX) 40 MG TABLET    One twice daily to prevent fluid retention.   MELOXICAM (MOBIC) 15 MG TABLET    One daily to help arthritis   METOLAZONE (ZAROXOLYN) 5 MG TABLET    One each morning to help prevent edema   MORPHINE (MSIR) 30 MG TABLET    Take two tablets by mouth every 12 hours as needed for pain   SULFAMETHOXAZOLE-TRIMETHOPRIM (BACTRIM DS,SEPTRA DS) 800-160 MG TABLET    One twice daily to control infection  Modified Medications   No medications on file  Discontinued Medications   No medications on file    Review of Systems  Constitutional: Positive for fatigue. Negative for activity change, appetite change, chills and fever.  HENT: Negative for congestion, ear pain, postnasal drip, sinus pressure, sore throat and trouble swallowing.   Eyes: Negative for visual disturbance.    Respiratory: Positive for cough. Negative for chest tightness and shortness of breath.   Cardiovascular: Positive for leg swelling. Negative for chest pain and palpitations.       New diagnosis of AF on 03/06/16, but EKG from 2016 suggests he had the problem then.  Gastrointestinal: Negative for abdominal pain, blood in stool, nausea and vomiting.  Genitourinary: Negative for difficulty urinating, frequency and urgency.       History of rectovesical fistula. Recurrent urinary tract infections.  Musculoskeletal: Positive for arthralgias, back pain, myalgias, neck pain and neck stiffness. Negative for gait problem.  Skin: Negative for rash.  Neurological: Positive for numbness (In both feet). Negative for dizziness, weakness and headaches.       Paresthesias in hands and lower forearms likely related to known cervical spine disease.  Psychiatric/Behavioral: Positive for sleep disturbance. Negative for confusion. The patient is not nervous/anxious.     Vitals:   03/20/16 1454  BP: (!) 142/82  Pulse: 77  Temp: 98.2 F (36.8 C)  TempSrc: Oral  SpO2: 96%  Weight: 226 lb (102.5 kg)  Height: _0  (1.753 m)   Body mass index is 33.37 kg/m. Wt Readings from Last 3 Encounters:  03/20/16 226 lb (102.5 kg)  03/06/16 227 lb 3.2 oz (103.1 kg)  02/08/16 226 lb (102.5 kg)      Physical Exam  Constitutional: He  is oriented to person, place, and time. He appears well-developed and well-nourished. No distress.  Overweight  HENT:  Right Ear: External ear normal.  Left Ear: External ear normal.  Nose: Nose normal.  Mouth/Throat: Oropharynx is clear and moist. No oropharyngeal exudate.  Significant partial hearing loss  Eyes: Conjunctivae and EOM are normal. Pupils are equal, round, and reactive to light.  Neck: No JVD present. No tracheal deviation present. No thyromegaly present.  Cardiovascular: Normal rate and intact distal pulses.  Exam reveals no gallop and no friction rub.   No  murmur heard. New finding on 03/06/16: Atrial fib. Rate controlled at 67 BPM.  Pulmonary/Chest: No respiratory distress. He has no wheezes. He has rales (right lower lobe posteriorly). He exhibits no tenderness.  Abdominal: He exhibits no distension and no mass. There is no tenderness.  Musculoskeletal: He exhibits edema (Increased lower leg edema with the left leg greater than the right) and tenderness.  Multiple painful areas including the neck, shoulders, back, hips, and knees. Diminished range of motion at the shoulders. Increased pain with flexion, extension, rotation of the back Increased pain with abduction or flexion of the right hip. Pain with palpation of the groin area. No bulge. Pain with extension of either knee associated with crepitus.  Lymphadenopathy:    He has no cervical adenopathy.  Neurological: He is alert and oriented to person, place, and time. He has normal reflexes. No cranial nerve deficit. Coordination normal.  Decreased sensation to Vibration and monofilament  Skin: No rash noted. No erythema. No pallor.  Psychiatric: He has a normal mood and affect. His behavior is normal. Judgment and thought content normal.    Labs reviewed: Lab Summary Latest Ref Rng & Units 03/15/2016 03/02/2016 02/06/2016 07/01/2015  Hemoglobin 13.2 - 17.1 g/dL (None) (None) 12.8(L) 12.9  Hematocrit 38.5 - 50.0 % (None) (None) 39.5 39.0  White count 3.8 - 10.8 K/uL (None) (None) 6.7 6.4  Platelet count 140 - 400 K/uL (None) (None) 112(L) (None)  Sodium 135 - 146 mmol/L 135 140 142 139  Potassium 3.5 - 5.3 mmol/L 3.2(L) 3.8 4.6 3.9  Calcium 8.6 - 10.3 mg/dL 8.9 8.1(L) 8.3(L) 8.7  Phosphorus - (None) (None) (None) (None)  Creatinine 0.70 - 1.18 mg/dL 1.91(H) 1.39(H) 1.25(H) 1.31(H)  AST 10 - 35 U/L (None) (None) 23 16  Alk Phos 40 - 115 U/L (None) (None) 77 72  Bilirubin 0.2 - 1.2 mg/dL (None) (None) 0.8 0.8  Glucose 65 - 99 mg/dL 112(H) 109(H) 111(H) 102(H)  Cholesterol 125 - 200 mg/dL  (None) (None) 150 (None)  HDL cholesterol >=40 mg/dL (None) (None) 38(L) 33(L)  Triglycerides <150 mg/dL (None) (None) 129 164(H)  LDL Direct - (None) (None) (None) (None)  LDL Calc <130 mg/dL (None) (None) 86 95  Total protein 6.1 - 8.1 g/dL (None) (None) 6.3 (None)  Albumin 3.6 - 5.1 g/dL (None) (None) 4.0 4.3  Some recent data might be hidden   No results found for: TSH, T3TOTAL, T4TOTAL, THYROIDAB Lab Results  Component Value Date   BUN 35 (H) 03/15/2016   BUN 22 03/02/2016   BUN 15 02/06/2016   Lab Results  Component Value Date   HGBA1C 6.6 (H) 02/06/2016   HGBA1C 6.5 (H) 03/25/2015   HGBA1C 6.3 (H) 12/22/2014    Assessment/Plan  1. Atrial fibrillation, unspecified type ([place]) Rate controlled - Ambulatory referral to Cardiology  2. Edema, unspecified type Slightly improved  3. Hypokalemia - potassium chloride SA (K-DUR,KLOR-CON) 20 MEQ tablet; One daily for  potassium supplement  Dispense: 90 tablet; Refill: 3 - Basic metabolic panel; Future

## 2016-04-04 ENCOUNTER — Encounter: Payer: Self-pay | Admitting: Cardiology

## 2016-04-04 MED FILL — metOLazone 5 MG TABS: 5 | 30 days supply | Qty: 30 | Fill #1

## 2016-04-06 ENCOUNTER — Encounter: Payer: Self-pay | Admitting: Cardiology

## 2016-04-06 ENCOUNTER — Ambulatory Visit (INDEPENDENT_AMBULATORY_CARE_PROVIDER_SITE_OTHER): Payer: PPO | Admitting: Cardiology

## 2016-04-06 VITALS — BP 132/80 | HR 85 | Ht 69.0 in | Wt 225.4 lb

## 2016-04-06 DIAGNOSIS — I4819 Other persistent atrial fibrillation: Secondary | ICD-10-CM

## 2016-04-06 DIAGNOSIS — R609 Edema, unspecified: Secondary | ICD-10-CM

## 2016-04-06 DIAGNOSIS — I481 Persistent atrial fibrillation: Secondary | ICD-10-CM | POA: Diagnosis not present

## 2016-04-06 MED ORDER — FUROSEMIDE 40 MG PO TABS: ORAL_TABLET | ORAL | 5 refills | Status: DC

## 2016-04-06 NOTE — Patient Instructions (Signed)
Medication Instructions:  Your physician has recommended you make the following change in your medication: 1) Take LASIX differently -  Take 80 mg Lasix once daily, all at one time, for 3 days. Then reduce to normal dosing of 40 mg twice daily  Labwork: None ordered  Testing/Procedures: Your physician has requested that you have an echocardiogram. Echocardiography is a painless test that uses sound waves to create images of your heart. It provides your doctor with information about the size and shape of your heart and how well your heart's chambers and valves are working. This procedure takes approximately one hour. There are no restrictions for this procedure.  Follow-Up: Your physician recommends that you schedule a follow-up appointment in: 3 months with Dr. Curt Bears.  If you need a refill on your cardiac medications before your next appointment, please call your pharmacy.  Thank you for choosing [facility]!!   Trinidad Curet, RN [phone]

## 2016-04-06 NOTE — Progress Notes (Signed)
Electrophysiology Office Note   Date:  04/06/2016   ID:  Earnestine Leys Sr., DOB [birth year], MRN [id number]  PCP:  Jeanmarie Hubert, MD  Primary Electrophysiologist:  Khyrin Trevathan Meredith Leeds, MD    Chief Complaint  Patient presents with  . New Patient (Initial Visit)    afib/aflutter  . Chest Pain  . Leg Swelling     History of Present Illness: Baxley Hemp. is a 80 y.o. male who presents today for electrophysiology evaluation.   History of newly diagnosed atrial fibrillation. He says that he has been feeling bad for the last few months. He's been having fatigue, and not sleeping well. He says that he has not been sleeping at all for the past few nights. He has been having issues of fatigue and shortness of breath. He does not notice palpitations. He also has been having lower extremity edema, and has been taking Lasix. He says that the Lasix, at this point, are not working very well.   Today, he denies symptoms of palpitations, chest pain, shortness of breath, orthopnea, PND, lower extremity edema, claudication, dizziness, presyncope, syncope, bleeding, or neurologic sequela. The patient is tolerating medications without difficulties and is otherwise without complaint today.    Past Medical History:  Diagnosis Date  . Anxiety state, unspecified   . Atrial fibrillation ([place]) 03/06/2016  . Chronic pain syndrome   . Constipation   . Depressive disorder, not elsewhere classified   . Dysphagia, unspecified(787.20)   . Herpes simplex disease   . Hyperlipidemia   . Hypertension   . Hypopotassemia   . Insomnia, unspecified   . Intestinovesical fistula   . Malignant neoplasm of prostate ([place])   . Myalgia and myositis, unspecified   . Osteoarthritis   . Pain in joint, lower leg    Bilateral knee pains  . Reflux esophagitis   . Rheumatoid arthritis with rheumatoid factor (H[facility])   . Spinal stenosis, unspecified region other than cervical   . Thrombocytopenia, unspecified ([place])   .  Type II or unspecified type diabetes mellitus without mention of complication, not stated as uncontrolled   . Unspecified arthropathy, pelvic region and thigh   . Unspecified hereditary and idiopathic peripheral neuropathy    Past Surgical History:  Procedure Laterality Date  . CARDIAC CATHETERIZATION  01/31/2004   Dr Gwenlyn Found  . COLONOSCOPY  08/09/2006   Dr Christian Mate, hemorrhoids/rectal fistula  . cystocopy  07/2006   Dr Harlow Asa  . NASAL SINUS SURGERY  1992  . PROSTATE SURGERY  1991     Current Outpatient Prescriptions  Medication Sig Dispense Refill  . apixaban (ELIQUIS) 5 MG TABS tablet Take 5 mg by mouth 2 (two) times daily. For Afib    . cromolyn (OPTICROM) 4 % ophthalmic solution     . furosemide (LASIX) 40 MG tablet One twice daily to prevent fluid retention. 60 tablet 5  . meloxicam (MOBIC) 15 MG tablet One daily to help arthritis 30 tablet 5  . metolazone (ZAROXOLYN) 5 MG tablet One each morning to help prevent edema 30 tablet 5  . morphine (MSIR) 30 MG tablet Take two tablets by mouth every 12 hours as needed for pain 120 tablet 0  . potassium chloride SA (K-DUR,KLOR-CON) 20 MEQ tablet One daily for potassium supplement 90 tablet 3  . sulfamethoxazole-trimethoprim (BACTRIM DS,SEPTRA DS) 800-160 MG tablet One twice daily to control infection 60 tablet 5   No current facility-administered medications for this visit.     Allergies:   Indocin [  indomethacin] and Methadone   Social History:  The patient  reports that he has never smoked. His smokeless tobacco use includes Chew. He reports that he does not drink alcohol or use drugs.   Family History:  The patient's family history includes Arthritis in his sister; Heart failure in his mother; Lung cancer in his daughter; Prostate cancer in his father.    ROS:  Please see the history of present illness.   Otherwise, review of systems is positive for Sweating, leg swelling, leg pain, palpitations, hearing loss, constipation,  balance problems, back pain, muscle pain, dizziness, easy bruising.   All other systems are reviewed and negative.    PHYSICAL EXAM: VS:  BP 132/80   Pulse 85   Ht 5\' 9"  (1.753 m)   Wt 225 lb 6.4 oz (102.2 kg)   BMI 33.29 kg/m  , BMI Body mass index is 33.29 kg/m. GEN: Well nourished, well developed, in no acute distress  HEENT: normal  Neck: no JVD, carotid bruits, or masses Cardiac: iRRR; no murmurs, rubs, or gallops, 2+percent edema  Respiratory:  clear to auscultation bilaterally, normal work of breathing GI: soft, nontender, nondistended, + BS MS: no deformity or atrophy  Skin: warm and dry Neuro:  Strength and sensation are intact Psych: euthymic mood, full affect  EKG:  EKG is ordered today. Personal review of the ekg ordered shows atrial fibrillation, inferior and septal Q waves  Recent Labs: 02/06/2016: ALT 16; Hemoglobin 12.8; Platelets 112 03/15/2016: BUN 35; Creat 1.91; Potassium 3.2; Sodium 135    Lipid Panel     Component Value Date/Time   CHOL 150 02/06/2016 1227   CHOL 161 07/01/2015 0944   TRIG 129 02/06/2016 1227   HDL 38 (L) 02/06/2016 1227   HDL 33 (L) 07/01/2015 0944   CHOLHDL 3.9 02/06/2016 1227   VLDL 26 02/06/2016 1227   LDLCALC 86 02/06/2016 1227   LDLCALC 95 07/01/2015 0944     Wt Readings from Last 3 Encounters:  04/06/16 225 lb 6.4 oz (102.2 kg)  03/20/16 226 lb (102.5 kg)  03/06/16 227 lb 3.2 oz (103.1 kg)      Other studies Reviewed: Additional studies/ records that were reviewed today include: PCP notes    ASSESSMENT AND PLAN:  1.  Persistent atrial fibrillation: On eliquis. He has symptoms of fatigue and some shortness of breath. He would likely benefit from a rhythm control strategy, as he does have symptoms. I discussed with him and his wife the option of cardioversion with antiarrhythmic medications. To determine the correct medication, we'll get a transthoracic echo to see if he has any evidence of heart failure. He does have  significant lower extremity edema, and this Geovanni Rahming help [place] determine his optimal medication regimen. We'll organize a cardioversion after he has been on medical management.    This patients CHA2DS2-VASc Score and unadjusted Ischemic Stroke Rate (% per year) is equal to 4.8 % stroke rate/year from a score of 4  Above score calculated as 1 point each if present [CHF, HTN, DM, Vascular=MI/PAD/Aortic Plaque, Age if 65-74, or Male] Above score calculated as 2 points each if present [Age > 75, or Stroke/TIA/TE]  2. Hypertension: well controlled today   3. LE edema: He has been taking Lasix at home, but they have not been working as well as they should. We'll ask him to take 80 mg of Lasix for the next 3 days. Call Kortez Murtagh call him after the weekend to determine if the increased dose is  having affect.   Current medicines are reviewed at length with the patient today.   The patient does not have concerns regarding his medicines.  The following changes were made today:  none  Labs/ tests ordered today include:  Orders Placed This Encounter  Procedures  . EKG 12-Lead  . ECHOCARDIOGRAM COMPLETE     Disposition:   FU with Shariece Viveiros 3 months  Signed, Merdith Boyd Meredith Leeds, MD  04/06/2016 2:38 PM     [place] [address] [place] [address] [phone] (office) [phone] (fax)

## 2016-04-13 ENCOUNTER — Telehealth: Payer: Self-pay | Admitting: *Deleted

## 2016-04-13 NOTE — Telephone Encounter (Signed)
Spoke to patient and his wife. Following up from 9/15 OV and increased Lasix x 3 days secondary to BLEE.  Reports BLEE has improved and "bulging leg veins" are better today.  Wife tells me that his "feet look horrible".   However, pt stopped Lasix & Eliquis because his legs/feet were getting worse.  The improvement only occurred after stopping medications. Patient tells me that he feels much better since stopping Lasix.  Discussed rationale for resuming/taking Eliquis.  Advised to restart Eliquis and pt is agreeable.  He will call back if SE begin after restarting med.  They are going to call back by Tuesday if they feel Dr. Curt Bears needs to see patient for continued edema.

## 2016-04-19 ENCOUNTER — Other Ambulatory Visit: Payer: Self-pay

## 2016-04-19 ENCOUNTER — Ambulatory Visit (HOSPITAL_COMMUNITY): Payer: PPO | Attending: Cardiology

## 2016-04-19 DIAGNOSIS — I481 Persistent atrial fibrillation: Secondary | ICD-10-CM | POA: Diagnosis not present

## 2016-04-19 DIAGNOSIS — R609 Edema, unspecified: Secondary | ICD-10-CM | POA: Insufficient documentation

## 2016-04-19 DIAGNOSIS — I119 Hypertensive heart disease without heart failure: Secondary | ICD-10-CM | POA: Insufficient documentation

## 2016-04-19 DIAGNOSIS — I4891 Unspecified atrial fibrillation: Secondary | ICD-10-CM | POA: Diagnosis not present

## 2016-04-19 DIAGNOSIS — Z8249 Family history of ischemic heart disease and other diseases of the circulatory system: Secondary | ICD-10-CM | POA: Insufficient documentation

## 2016-04-19 DIAGNOSIS — E119 Type 2 diabetes mellitus without complications: Secondary | ICD-10-CM | POA: Insufficient documentation

## 2016-04-19 DIAGNOSIS — I4819 Other persistent atrial fibrillation: Secondary | ICD-10-CM

## 2016-04-19 DIAGNOSIS — I77810 Thoracic aortic ectasia: Secondary | ICD-10-CM | POA: Insufficient documentation

## 2016-04-19 DIAGNOSIS — I358 Other nonrheumatic aortic valve disorders: Secondary | ICD-10-CM | POA: Diagnosis not present

## 2016-04-19 DIAGNOSIS — E785 Hyperlipidemia, unspecified: Secondary | ICD-10-CM | POA: Diagnosis not present

## 2016-04-30 ENCOUNTER — Encounter: Payer: Self-pay | Admitting: Cardiology

## 2016-04-30 NOTE — Telephone Encounter (Signed)
Ronnie Jackson is returning a call , please call   Thanks

## 2016-04-30 NOTE — Telephone Encounter (Signed)
Follow up ° ° ° ° ° °Returning a call to the nurse °

## 2016-04-30 NOTE — Telephone Encounter (Signed)
This encounter was created in error - please disregard.

## 2016-05-22 ENCOUNTER — Other Ambulatory Visit: Payer: Self-pay | Admitting: *Deleted

## 2016-05-22 DIAGNOSIS — M25562 Pain in left knee: Principal | ICD-10-CM

## 2016-05-22 DIAGNOSIS — M25561 Pain in right knee: Secondary | ICD-10-CM

## 2016-05-22 DIAGNOSIS — G894 Chronic pain syndrome: Secondary | ICD-10-CM

## 2016-05-22 MED ORDER — MORPHINE SULFATE 30 MG PO TABS: ORAL_TABLET | ORAL | 0 refills | Status: DC

## 2016-05-22 MED FILL — MORPHINE SULFATE IR 30 MG T: 30 | 30 days supply | Qty: 120 | Fill #0

## 2016-05-22 NOTE — Telephone Encounter (Signed)
Jean,wife requested and will pick up 

## 2016-05-25 ENCOUNTER — Other Ambulatory Visit: Payer: Self-pay | Admitting: *Deleted

## 2016-05-25 MED ORDER — APIXABAN 5 MG PO TABS: 5.0000 mg | ORAL_TABLET | Freq: Two times a day (BID) | ORAL | 1 refills | Status: DC

## 2016-05-25 MED FILL — ELIQUIS 5 MG TABLET: 5 | 30 days supply | Qty: 60 | Fill #0

## 2016-05-25 NOTE — Telephone Encounter (Signed)
Patient wife, [name] requested refill to be sent to pharmacy.

## 2016-05-28 MED FILL — FUROSEMIDE 40 MG TABLET: 40 | 30 days supply | Qty: 60 | Fill #1

## 2016-06-21 ENCOUNTER — Other Ambulatory Visit: Payer: Self-pay

## 2016-06-21 DIAGNOSIS — M25562 Pain in left knee: Principal | ICD-10-CM

## 2016-06-21 DIAGNOSIS — M25561 Pain in right knee: Secondary | ICD-10-CM

## 2016-06-21 DIAGNOSIS — G894 Chronic pain syndrome: Secondary | ICD-10-CM

## 2016-06-21 MED ORDER — MORPHINE SULFATE 30 MG PO TABS: ORAL_TABLET | ORAL | 0 refills | Status: DC

## 2016-06-21 MED FILL — MORPHINE SULFATE IR 30 MG T: 30 | 30 days supply | Qty: 120 | Fill #0

## 2016-06-22 DEATH — deceased

## 2016-06-29 MED FILL — ELIQUIS 5 MG TABLET: 5 | 30 days supply | Qty: 60 | Fill #1

## 2016-07-06 ENCOUNTER — Ambulatory Visit (INDEPENDENT_AMBULATORY_CARE_PROVIDER_SITE_OTHER): Payer: PPO | Admitting: Cardiology

## 2016-07-06 ENCOUNTER — Telehealth: Payer: Self-pay | Admitting: *Deleted

## 2016-07-06 ENCOUNTER — Encounter: Payer: Self-pay | Admitting: *Deleted

## 2016-07-06 ENCOUNTER — Encounter: Payer: Self-pay | Admitting: Cardiology

## 2016-07-06 ENCOUNTER — Other Ambulatory Visit: Payer: Self-pay | Admitting: Cardiology

## 2016-07-06 VITALS — BP 120/78 | HR 82 | Ht 70.0 in | Wt 226.8 lb

## 2016-07-06 DIAGNOSIS — Z01812 Encounter for preprocedural laboratory examination: Secondary | ICD-10-CM | POA: Diagnosis not present

## 2016-07-06 DIAGNOSIS — I481 Persistent atrial fibrillation: Secondary | ICD-10-CM

## 2016-07-06 DIAGNOSIS — Z79899 Other long term (current) drug therapy: Secondary | ICD-10-CM | POA: Diagnosis not present

## 2016-07-06 DIAGNOSIS — I4819 Other persistent atrial fibrillation: Secondary | ICD-10-CM

## 2016-07-06 LAB — CBC WITH DIFFERENTIAL/PLATELET
Basophils Absolute: 0 cells/uL (ref 0–200)
Basophils Relative: 0 %
Eosinophils Absolute: 252 cells/uL (ref 15–500)
Eosinophils Relative: 4 %
HCT: 38.7 % (ref 38.5–50.0)
Hemoglobin: 13.1 g/dL — ABNORMAL LOW (ref 13.2–17.1)
Lymphocytes Relative: 40 %
Lymphs Abs: 2520 cells/uL (ref 850–3900)
MCH: 31.3 pg (ref 27.0–33.0)
MCHC: 33.9 g/dL (ref 32.0–36.0)
MCV: 92.6 fL (ref 80.0–100.0)
MPV: 10.5 fL (ref 7.5–12.5)
Monocytes Absolute: 630 cells/uL (ref 200–950)
Monocytes Relative: 10 %
Neutro Abs: 2898 cells/uL (ref 1500–7800)
Neutrophils Relative %: 46 %
Platelets: 164 10*3/uL (ref 140–400)
RBC: 4.18 MIL/uL — ABNORMAL LOW (ref 4.20–5.80)
RDW: 13.9 % (ref 11.0–15.0)
WBC: 6.3 10*3/uL (ref 3.8–10.8)

## 2016-07-06 LAB — BASIC METABOLIC PANEL
BUN: 10 mg/dL (ref 7–25)
CO2: 27 mmol/L (ref 20–31)
Calcium: 8.8 mg/dL (ref 8.6–10.3)
Chloride: 103 mmol/L (ref 98–110)
Creat: 1.53 mg/dL — ABNORMAL HIGH (ref 0.70–1.18)
Glucose, Bld: 120 mg/dL — ABNORMAL HIGH (ref 65–99)
Potassium: 4.3 mmol/L (ref 3.5–5.3)
Sodium: 141 mmol/L (ref 135–146)

## 2016-07-06 MED FILL — FUROSEMIDE 40 MG TABLET: 40 | 30 days supply | Qty: 60 | Fill #2

## 2016-07-06 NOTE — Patient Instructions (Addendum)
Medication Instructions:    Your physician has recommended you make the following change in your medication:  1) START Flecainide 50 mg twice a day -- DO NOT START THIS MEDICATION UNTIL THE NURSE CALLS AND INSTRUCTS YOU  --- If you need a refill on your cardiac medications before your next appointment, please call your pharmacy. ---  Labwork:  None ordered  Testing/Procedures: Your physician has requested that you have a lexiscan myoview -- needs to be scheduled at least 10 days from today. For further information please visit [email or web address]. Please follow instruction sheet, as given.  Your physician has recommended that you have a Cardioversion (DCCV). Electrical Cardioversion uses a jolt of electricity to your heart either through paddles or wired patches attached to your chest. This is a controlled, usually prescheduled, procedure. Defibrillation is done under light anesthesia in the hospital, and you usually go home the day of the procedure. This is done to get your heart back into a normal rhythm. You are not awake for the procedure. Please see the instruction sheet given to you today.  Follow-Up:  Your physician recommends that you schedule a follow-up appointment in: 6 weeks with Dr. Curt Bears.  Thank you for choosing [facility]!!   Trinidad Curet, RN [phone]   Any Other Special Instructions Will Be Listed Below (If Applicable).  Flecainide tablets What is this medicine? FLECAINIDE (FLEK a nide) is an antiarrhythmic drug. This medicine is used to prevent irregular heart rhythm. It can also slow down fast heartbeats called tachycardia. This medicine may be used for other purposes; ask your health care provider or pharmacist if you have questions. COMMON BRAND NAME(S): Tambocor What should I tell my health care provider before I take this medicine? They need to know if you have any of these conditions: -abnormal levels of potassium in the blood -heart disease  including heart rhythm and heart rate problems -kidney or liver disease -recent heart attack -an unusual or allergic reaction to flecainide, local anesthetics, other medicines, foods, dyes, or preservatives -pregnant or trying to get pregnant -breast-feeding How should I use this medicine? Take this medicine by mouth with a glass of water. Follow the directions on the prescription label. You can take this medicine with or without food. Take your doses at regular intervals. Do not take your medicine more often than directed. Do not stop taking this medicine suddenly. This may cause serious, heart-related side effects. If your doctor wants you to stop the medicine, the dose may be slowly lowered over time to avoid any side effects. Talk to your pediatrician regarding the use of this medicine in children. While this drug may be prescribed for children as young as 1 year of age for selected conditions, precautions do apply. Overdosage: If you think you have taken too much of this medicine contact a poison control center or emergency room at once. NOTE: This medicine is only for you. Do not share this medicine with others. What if I miss a dose? If you miss a dose, take it as soon as you can. If it is almost time for your next dose, take only that dose. Do not take double or extra doses. What may interact with this medicine? Do not take this medicine with any of the following medications: -amoxapine -arsenic trioxide -certain antibiotics like clarithromycin, erythromycin, gatifloxacin, gemifloxacin, levofloxacin, moxifloxacin, sparfloxacin, or troleandomycin -certain antidepressants called tricyclic antidepressants like amitriptyline, imipramine, or nortriptyline -certain medicines to control heart rhythm like disopyramide, dofetilide, encainide, moricizine, procainamide, propafenone, and  quinidine -cisapride -cyclobenzaprine -delavirdine -droperidol -haloperidol -hawthorn -imatinib -levomethadyl -maprotiline -medicines for malaria like chloroquine and halofantrine -pentamidine -phenothiazines like chlorpromazine, mesoridazine, prochlorperazine, thioridazine -pimozide -quinine -ranolazine -ritonavir -sertindole -ziprasidone This medicine may also interact with the following medications: -cimetidine -medicines for angina or high blood pressure -medicines to control heart rhythm like amiodarone and digoxin This list may not describe all possible interactions. Give your health care provider a list of all the medicines, herbs, non-prescription drugs, or dietary supplements you use. Also tell them if you smoke, drink alcohol, or use illegal drugs. Some items may interact with your medicine. What should I watch for while using this medicine? Visit your doctor or health care professional for regular checks on your progress. Because your condition and the use of this medicine carries some risk, it is a good idea to carry an identification card, necklace or bracelet with details of your condition, medications and doctor or health care professional. Check your blood pressure and pulse rate regularly. Ask your health care professional what your blood pressure and pulse rate should be, and when you should contact him or her. Your doctor or health care professional also may schedule regular blood tests and electrocardiograms to check your progress. You may get drowsy or dizzy. Do not drive, use machinery, or do anything that needs mental alertness until you know how this medicine affects you. Do not stand or sit up quickly, especially if you are an older patient. This reduces the risk of dizzy or fainting spells. Alcohol can make you more dizzy, increase flushing and rapid heartbeats. Avoid alcoholic drinks. What side effects may I notice from receiving this medicine? Side effects  that you should report to your doctor or health care professional as soon as possible: -chest pain, continued irregular heartbeats -difficulty breathing -swelling of the legs or feet -trembling, shaking -unusually weak or tired Side effects that usually do not require medical attention (report to your doctor or health care professional if they continue or are bothersome): -blurred vision -constipation -headache -nausea, vomiting -stomach pain This list may not describe all possible side effects. Call your doctor for medical advice about side effects. You may report side effects to FDA at 1-800-FDA-1088. Where should I keep my medicine? Keep out of the reach of children. Store at room temperature between 15 and 30 degrees C (59 and 86 degrees F). Protect from light. Keep container tightly closed. Throw away any unused medicine after the expiration date. NOTE: This sheet is a summary. It may not cover all possible information. If you have questions about this medicine, talk to your doctor, pharmacist, or health care provider.  2017 Elsevier/Gold Standard (2007-11-12 16:46:09)    Pharmacologic Stress Electrocardiogram A pharmacologic stress electrocardiogram is a heart (cardiac) test that uses nuclear imaging to evaluate the blood supply to your heart. This test may also be called a pharmacologic stress electrocardiography. Pharmacologic means that a medicine is used to increase your heart rate and blood pressure.  This stress test is done to find areas of poor blood flow to the heart by determining the extent of coronary artery disease (CAD). Some people exercise on a treadmill, which naturally increases the blood flow to the heart. For those people unable to exercise on a treadmill, a medicine is used. This medicine stimulates your heart and will cause your heart to beat harder and more quickly, as if you were exercising.  Pharmacologic stress tests can help determine:  The adequacy of  blood flow to your heart during  increased levels of activity in order to clear you for discharge home.  The extent of coronary artery blockage caused by CAD.  Your prognosis if you have suffered a heart attack.  The effectiveness of cardiac procedures done, such as an angioplasty, which can increase the circulation in your coronary arteries.  Causes of chest pain or pressure. LET [facility] CARE PROVIDER KNOW ABOUT:  Any allergies you have.  All medicines you are taking, including vitamins, herbs, eye drops, creams, and over-the-counter medicines.  Previous problems you or members of your family have had with the use of anesthetics.  Any blood disorders you have.  Previous surgeries you have had.  Medical conditions you have.  Possibility of pregnancy, if this applies.  If you are currently breastfeeding. RISKS AND COMPLICATIONS Generally, this is a safe procedure. However, as with any procedure, complications can occur. Possible complications include:  You develop pain or pressure in the following areas:  Chest.  Jaw or neck.  Between your shoulder blades.  Radiating down your left arm.  Headache.  Dizziness or light-headedness.  Shortness of breath.  Increased or irregular heartbeat.  Low blood pressure.  Nausea or vomiting.  Flushing.  Redness going up the arm and slight pain during injection of medicine.  Heart attack (rare). BEFORE THE PROCEDURE   Avoid all forms of caffeine for 24 hours before your test or as directed by your health care provider. This includes coffee, tea (even decaffeinated tea), caffeinated sodas, chocolate, cocoa, and certain pain medicines.  Follow your health care provider's instructions regarding eating and drinking before the test.  Take your medicines as directed at regular times with water unless instructed otherwise. Exceptions may include:  If you have diabetes, ask how you are to take your insulin or pills. It is  common to adjust insulin dosing the morning of the test.  If you are taking beta-blocker medicines, it is important to talk to your health care provider about these medicines well before the date of your test. Taking beta-blocker medicines may interfere with the test. In some cases, these medicines need to be changed or stopped 24 hours or more before the test.  If you wear a nitroglycerin patch, it may need to be removed prior to the test. Ask your health care provider if the patch should be removed before the test.  If you use an inhaler for any breathing condition, bring it with you to the test.  If you are an outpatient, bring a snack so you can eat right after the stress phase of the test.  Do not smoke for 4 hours prior to the test or as directed by your health care provider.  Do not apply lotions, powders, creams, or oils on your chest prior to the test.  Wear comfortable shoes and clothing. Let your health care provider know if you were unable to complete or follow the preparations for your test. PROCEDURE   Multiple patches (electrodes) will be put on your chest. If needed, small areas of your chest may be shaved to get better contact with the electrodes. Once the electrodes are attached to your body, multiple wires will be attached to the electrodes, and your heart rate will be monitored.  An IV access will be started. A nuclear trace (isotope) is given. The isotope may be given intravenously, or it may be swallowed. Nuclear refers to several types of radioactive isotopes, and the nuclear isotope lights up the arteries so that the nuclear images are clear.  The isotope is absorbed by your body. This results in low radiation exposure.  A resting nuclear image is taken to show how your heart functions at rest.  A medicine is given through the IV access.  A second scan is done about 1 hour after the medicine injection and determines how your heart functions under stress.  During  this stress phase, you will be connected to an electrocardiogram machine. Your blood pressure and oxygen levels will be monitored. AFTER THE PROCEDURE   Your heart rate and blood pressure will be monitored after the test.  You may return to your normal schedule, including diet,activities, and medicines, unless your health care provider tells you otherwise. This information is not intended to replace advice given to you by your health care provider. Make sure you discuss any questions you have with your health care provider. Document Released: 11/25/2008 Document Revised: 07/14/2013 Document Reviewed: 03/16/2013 Elsevier Interactive Patient Education  2017 [employer].   Electrical Cardioversion, Care After This sheet gives you information about how to care for yourself after your procedure. Your health care provider may also give you more specific instructions. If you have problems or questions, contact your health care provider. What can I expect after the procedure? After the procedure, it is common to have:  Some redness on the skin where the shocks were given. Follow these instructions at home:  Do not drive for 24 hours if you were given a medicine to help you relax (sedative).  Take over-the-counter and prescription medicines only as told by your health care provider.  Ask your health care provider how to check your pulse. Check it often.  Rest for 48 hours after the procedure or as told by your health care provider.  Avoid or limit your caffeine use as told by your health care provider. Contact a health care provider if:  You feel like your heart is beating too quickly or your pulse is not regular.  You have a serious muscle cramp that does not go away. Get help right away if:  You have discomfort in your chest.  You are dizzy or you feel faint.  You have trouble breathing or you are short of breath.  Your speech is slurred.  You have trouble moving an arm or leg  on one side of your body.  Your fingers or toes turn cold or blue. This information is not intended to replace advice given to you by your health care provider. Make sure you discuss any questions you have with your health care provider. Document Released: 04/29/2013 Document Revised: 02/10/2016 Document Reviewed: 01/13/2016 Elsevier Interactive Patient Education  2017 [employer].

## 2016-07-06 NOTE — Progress Notes (Signed)
Electrophysiology Office Note   Date:  07/06/2016   ID:  Ronnie Leys Sr., DOB 17-Sep-1935, MRN [id number]  PCP:  Jeanmarie Hubert, MD  Primary Electrophysiologist:  Ervin Rothbauer Meredith Leeds, MD    Chief Complaint  Patient presents with  . Follow-up    Persistent AFib     History of Present Illness: Ronnie Cheeves. is a 79 y.o. male who presents today for electrophysiology evaluation.   History of newly diagnosed atrial fibrillation. He is continuing to have significant amounts of fatigue. He is unable to ambulate as he ordinarily would be. He feels that he is still in atrial fibrillation. He did have an echocardiogram that showed a normal ejection fraction.   Today, he denies symptoms of palpitations, chest pain, shortness of breath, orthopnea, PND, lower extremity edema, claudication, dizziness, presyncope, syncope, bleeding, or neurologic sequela. The patient is tolerating medications without difficulties and is otherwise without complaint today.    Past Medical History:  Diagnosis Date  . Anxiety state, unspecified   . Atrial fibrillation ([place]) 03/06/2016  . Chronic pain syndrome   . Constipation   . Depressive disorder, not elsewhere classified   . Dysphagia, unspecified(787.20)   . Herpes simplex disease   . Hyperlipidemia   . Hypertension   . Hypopotassemia   . Insomnia, unspecified   . Intestinovesical fistula   . Malignant neoplasm of prostate ([place])   . Myalgia and myositis, unspecified   . Osteoarthritis   . Pain in joint, lower leg    Bilateral knee pains  . Reflux esophagitis   . Rheumatoid arthritis with rheumatoid factor (HCC)   . Spinal stenosis, unspecified region other than cervical   . Thrombocytopenia, unspecified   . Type II or unspecified type diabetes mellitus without mention of complication, not stated as uncontrolled   . Unspecified arthropathy, pelvic region and thigh   . Unspecified hereditary and idiopathic peripheral neuropathy    Past  Surgical History:  Procedure Laterality Date  . CARDIAC CATHETERIZATION  01/31/2004   Dr Gwenlyn Found  . COLONOSCOPY  08/09/2006   Dr Christian Mate, hemorrhoids/rectal fistula  . cystocopy  07/2006   Dr Harlow Asa  . NASAL SINUS SURGERY  1992  . PROSTATE SURGERY  1991     Current Outpatient Prescriptions  Medication Sig Dispense Refill  . apixaban (ELIQUIS) 5 MG TABS tablet Take 1 tablet (5 mg total) by mouth 2 (two) times daily. For Afib 60 tablet 1  . furosemide (LASIX) 40 MG tablet One twice daily to prevent fluid retention. 60 tablet 5  . morphine (MSIR) 30 MG tablet Take two tablets by mouth every 12 hours as needed for pain 120 tablet 0  . potassium chloride SA (K-DUR,KLOR-CON) 20 MEQ tablet One daily for potassium supplement 90 tablet 3  . sulfamethoxazole-trimethoprim (BACTRIM DS,SEPTRA DS) 800-160 MG tablet One twice daily to control infection 60 tablet 5   No current facility-administered medications for this visit.     Allergies:   Indocin [indomethacin] and Methadone   Social History:  The patient  reports that he has never smoked. His smokeless tobacco use includes Chew. He reports that he does not drink alcohol or use drugs.   Family History:  The patient's family history includes Arthritis in his sister; Heart failure in his mother; Lung cancer in his daughter; Prostate cancer in his father.    ROS:  Please see the history of present illness.   Otherwise, review of systems is positive for fatigue, leg swelling, leg pain,  joint pain.   All other systems are reviewed and negative.    PHYSICAL EXAM: VS:  BP 120/78   Pulse 82   Ht 5\' 10"  (1.778 m)   Wt 226 lb 12.8 oz (102.9 kg)   BMI 32.54 kg/m  , BMI Body mass index is 32.54 kg/m. GEN: Well nourished, well developed, in no acute distress  HEENT: normal  Neck: no JVD, carotid bruits, or masses Cardiac: iRRR; no murmurs, rubs, or gallops, 2+percent edema  Respiratory:  clear to auscultation bilaterally, normal work of  breathing GI: soft, nontender, nondistended, + BS MS: no deformity or atrophy  Skin: warm and dry Neuro:  Strength and sensation are intact Psych: euthymic mood, full affect  EKG:  EKG is ordered today. Personal review of the ekg ordered shows atrial fibrillation, rate 80   Recent Lab//s: 02/06/2016: ALT 16; Hemoglobin 12.8; Platelets 112 03/15/2016: BUN 35; Creat 1.91; Potassium 3.2; Sodium 135    Lipid Panel     Component Value Date/Time   CHOL 150 02/06/2016 1227   CHOL 161 07/01/2015 0944   TRIG 129 02/06/2016 1227   HDL 38 (L) 02/06/2016 1227   HDL 33 (L) 07/01/2015 0944   CHOLHDL 3.9 02/06/2016 1227   VLDL 26 02/06/2016 1227   LDLCALC 86 02/06/2016 1227   LDLCALC 95 07/01/2015 0944     Wt Readings from Last 3 Encounters:  07/06/16 226 lb 12.8 oz (102.9 kg)  04/06/16 225 lb 6.4 oz (102.2 kg)  03/20/16 226 lb (102.5 kg)      Other studies Reviewed: Additional studies/ records that were reviewed today include: TTE 04/19/16 - Left ventricle: The cavity size was normal. Wall thickness was   increased in a pattern of mild LVH. Systolic function was   vigorous. The estimated ejection fraction was in the range of 65%   to 70%. Wall motion was normal; there were no regional wall   motion abnormalities. - Aortic valve: Trileaflet; mildly thickened, moderately calcified   leaflets. - Aorta: Ascending aortic diameter: 41 mm (S). - Ascending aorta: The ascending aorta was mildly dilated. - Left atrium: The atrium was normal in size. Volume/bsa, ES   (1-plane Simpson&'s, A4C): 29.8 ml/m^2.   ASSESSMENT AND PLAN:  1.  Persistent atrial fibrillation: On eliquis. He has symptoms of fatigue and some shortness of breath. He would likely benefit from a rhythm control strategy, as he does have symptoms. I discussed with him the option of rhythm control with flecainide as well as cardioversion. We'll start him on 50 mg of flecainide today and plan for cardioversion.   This  patients CHA2DS2-VASc Score and unadjusted Ischemic Stroke Rate (% per year) is equal to 4.8 % stroke rate/year from a score of 4  Above score calculated as 1 point each if present [CHF, HTN, DM, Vascular=MI/PAD/Aortic Plaque, Age if 65-74, or Male] Above score calculated as 2 points each if present [Age > 75, or Stroke/TIA/TE]  2. Hypertension: well controlled today   3. LE edema: He has been taking Lasix at home, but they have not been working as well as they should. Ronnie Jackson switch to daily lasix today  Current medicines are reviewed at length with the patient today.   The patient does not have concerns regarding his medicines.  The following changes were made today:  flecainide  Labs/ tests ordered today include: ECG Orders Placed This Encounter  Procedures  . Basic metabolic panel  . CBC w/Diff     Disposition:   FU  with Ronnie Jackson 3 months  Signed, Ronnie Jackson Meredith Leeds, MD  07/06/2016 12:23 PM     [place] [address] [phone] (office) [phone] (fax)

## 2016-07-06 NOTE — Telephone Encounter (Signed)
Informed to start Flecainide tomorrow/Sunday. Patient's wife verbalized understanding and agreeable to plan.

## 2016-07-09 ENCOUNTER — Encounter (HOSPITAL_COMMUNITY): Admission: RE | Payer: Self-pay | Source: Ambulatory Visit

## 2016-07-09 ENCOUNTER — Telehealth: Payer: Self-pay | Admitting: Cardiology

## 2016-07-09 ENCOUNTER — Other Ambulatory Visit: Payer: Self-pay | Admitting: *Deleted

## 2016-07-09 ENCOUNTER — Ambulatory Visit (HOSPITAL_COMMUNITY): Admission: RE | Admit: 2016-07-09 | Payer: PPO | Source: Ambulatory Visit | Admitting: Internal Medicine

## 2016-07-09 ENCOUNTER — Encounter: Payer: Self-pay | Admitting: *Deleted

## 2016-07-09 DIAGNOSIS — I4819 Other persistent atrial fibrillation: Secondary | ICD-10-CM

## 2016-07-09 SURGERY — CARDIOVERSION
Anesthesia: Monitor Anesthesia Care

## 2016-07-09 MED ORDER — FLECAINIDE ACETATE 50 MG PO TABS: 50.0000 mg | ORAL_TABLET | Freq: Two times a day (BID) | ORAL | 0 refills | Status: DC

## 2016-07-09 MED FILL — FLECAINIDE ACETATE 50 MG TA: 50 | 30 days supply | Qty: 60 | Fill #0

## 2016-07-09 NOTE — Telephone Encounter (Signed)
Returned call to patients wife.  Stated that she was at [facility] to pickup her husbands flecainide.  Was supposed to have started it on 07-06-16, but pharmacy closed before pickup. Came to pharmacy this am to pickup but the script hadn't been sent, wife waiting at pharmacy.  Note said to start 50mg  of flecainide.  Called Dr to clarify dosage as 50mg  twice a day.  Called to the pharmacy and called wife to advise her.

## 2016-07-09 NOTE — Telephone Encounter (Signed)
NEw Message  Kathlee Nations From the Endoscopy lab at [facility] call requesting to speak with RN to see if pt still needs appt for 12/18. Please call back to discuss

## 2016-07-09 NOTE — Telephone Encounter (Signed)
Follow up    Pt wife verbalized that rn sent in a new prescription but she has not received it for pt

## 2016-07-09 NOTE — Telephone Encounter (Signed)
Returned call in endo.  Nurse stated that the patient didn't show for the scheduled cardioversion at 1:00.  Told that I would forward call to Dr/nurse.

## 2016-07-10 ENCOUNTER — Other Ambulatory Visit: Payer: Self-pay | Admitting: Cardiology

## 2016-07-10 NOTE — Telephone Encounter (Signed)
Rescheduled DCCV to 12/21.  Reviewed instructions with wife.  Patient's verbalized understanding and agreeable to plan.

## 2016-07-10 NOTE — Telephone Encounter (Signed)
[name] is returning your call. Thanks  °

## 2016-07-10 NOTE — Telephone Encounter (Signed)
Pt's wife returns my call. Discussed why pt missed scheduled DCCV yesterday.  Wife very apologetic, she got mixed up with dates/tests/procedures.  Reassured her this was ok and we can reschedule. Rescheduled for Thursday 12/21 w/ [facility]. She is driving at the moment and cannot write down instructions.  I will attempt to reach her this afternoon to review, if not we will talk in the morning.

## 2016-07-11 ENCOUNTER — Telehealth: Payer: Self-pay | Admitting: *Deleted

## 2016-07-11 NOTE — Telephone Encounter (Signed)
-----   Message from Will Meredith Leeds, MD sent at 07/10/2016 10:07 AM EST ----- Normal labs reviewed.

## 2016-07-11 NOTE — Telephone Encounter (Signed)
Patient's wife informed

## 2016-07-12 ENCOUNTER — Ambulatory Visit (HOSPITAL_COMMUNITY)
Admission: RE | Admit: 2016-07-12 | Discharge: 2016-07-12 | Disposition: A | Discharge status: Home / Self Care | Payer: PPO | Source: Ambulatory Visit | Attending: Internal Medicine | Admitting: Internal Medicine

## 2016-07-12 ENCOUNTER — Encounter (HOSPITAL_COMMUNITY)
Admission: RE | Disposition: A | Discharge status: Home / Self Care | Payer: Self-pay | Source: Ambulatory Visit | Attending: Internal Medicine

## 2016-07-12 ENCOUNTER — Ambulatory Visit (HOSPITAL_COMMUNITY): Payer: PPO | Admitting: Certified Registered Nurse Anesthetist

## 2016-07-12 ENCOUNTER — Telehealth (HOSPITAL_COMMUNITY): Payer: Self-pay | Admitting: *Deleted

## 2016-07-12 ENCOUNTER — Encounter (HOSPITAL_COMMUNITY): Payer: Self-pay

## 2016-07-12 DIAGNOSIS — Z8249 Family history of ischemic heart disease and other diseases of the circulatory system: Secondary | ICD-10-CM | POA: Insufficient documentation

## 2016-07-12 DIAGNOSIS — Z79899 Other long term (current) drug therapy: Secondary | ICD-10-CM | POA: Insufficient documentation

## 2016-07-12 DIAGNOSIS — I10 Essential (primary) hypertension: Secondary | ICD-10-CM | POA: Insufficient documentation

## 2016-07-12 DIAGNOSIS — Z7901 Long term (current) use of anticoagulants: Secondary | ICD-10-CM | POA: Insufficient documentation

## 2016-07-12 DIAGNOSIS — E785 Hyperlipidemia, unspecified: Secondary | ICD-10-CM | POA: Diagnosis not present

## 2016-07-12 DIAGNOSIS — R600 Localized edema: Secondary | ICD-10-CM | POA: Diagnosis not present

## 2016-07-12 DIAGNOSIS — I481 Persistent atrial fibrillation: Secondary | ICD-10-CM | POA: Insufficient documentation

## 2016-07-12 DIAGNOSIS — E119 Type 2 diabetes mellitus without complications: Secondary | ICD-10-CM | POA: Diagnosis not present

## 2016-07-12 DIAGNOSIS — M1990 Unspecified osteoarthritis, unspecified site: Secondary | ICD-10-CM | POA: Diagnosis not present

## 2016-07-12 DIAGNOSIS — E1140 Type 2 diabetes mellitus with diabetic neuropathy, unspecified: Secondary | ICD-10-CM | POA: Diagnosis not present

## 2016-07-12 DIAGNOSIS — I4891 Unspecified atrial fibrillation: Secondary | ICD-10-CM

## 2016-07-12 HISTORY — PX: CARDIOVERSION: SHX1299

## 2016-07-12 SURGERY — CARDIOVERSION
Anesthesia: General

## 2016-07-12 MED ORDER — PROPOFOL 10 MG/ML IV BOLUS
INTRAVENOUS | Status: DC | PRN
  Administered 2016-07-12: 20 mg via INTRAVENOUS
  Administered 2016-07-12: 40 mg via INTRAVENOUS

## 2016-07-12 MED ORDER — SODIUM CHLORIDE 0.9 % IV SOLN
INTRAVENOUS | Status: DC | PRN
  Administered 2016-07-12: 14:00:00 via INTRAVENOUS

## 2016-07-12 NOTE — Discharge Instructions (Signed)
Electrical Cardioversion, Care After This sheet gives you information about how to care for yourself after your procedure. Your health care provider may also give you more specific instructions. If you have problems or questions, contact your health care provider. What can I expect after the procedure? After the procedure, it is common to have:  Some redness on the skin where the shocks were given. Follow these instructions at home:  Do not drive for 24 hours if you were given a medicine to help you relax (sedative).  Take over-the-counter and prescription medicines only as told by your health care provider.  Ask your health care provider how to check your pulse. Check it often.  Rest for 48 hours after the procedure or as told by your health care provider.  Avoid or limit your caffeine use as told by your health care provider. Contact a health care provider if:  You feel like your heart is beating too quickly or your pulse is not regular.  You have a serious muscle cramp that does not go away. Get help right away if:  You have discomfort in your chest.  You are dizzy or you feel faint.  You have trouble breathing or you are short of breath.  Your speech is slurred.  You have trouble moving an arm or leg on one side of your body.  Your fingers or toes turn cold or blue. This information is not intended to replace advice given to you by your health care provider. Make sure you discuss any questions you have with your health care provider. Document Released: 04/29/2013 Document Revised: 02/10/2016 Document Reviewed: 01/13/2016 Elsevier Interactive Patient Education  2017 [place], Care After These instructions provide you with information about caring for yourself after your procedure. Your health care provider may also give you more specific instructions. Your treatment has been planned according to current medical  practices, but problems sometimes occur. Call your health care provider if you have any problems or questions after your procedure. What can I expect after the procedure? After your procedure, it is common to:  Feel sleepy for several hours.  Feel clumsy and have poor balance for several hours.  Feel forgetful about what happened after the procedure.  Have poor judgment for several hours.  Feel nauseous or vomit.  Have a sore throat if you had a breathing tube during the procedure. Follow these instructions at home: For at least 24 hours after the procedure:   Do not:  Participate in activities in which you could fall or become injured.  Drive.  Use heavy machinery.  Drink alcohol.  Take sleeping pills or medicines that cause drowsiness.  Make important decisions or sign legal documents.  Take care of children on your own.  Rest. Eating and drinking  Follow the diet that is recommended by your health care provider.  If you vomit, drink water, juice, or soup when you can drink without vomiting.  Make sure you have little or no nausea before eating solid foods. General instructions  Have a responsible adult stay with you until you are awake and alert.  Take over-the-counter and prescription medicines only as told by your health care provider.  If you smoke, do not smoke without supervision.  Keep all follow-up visits as told by your health care provider. This is important. Contact a health care provider if:  You keep feeling nauseous or you  keep vomiting.  You feel light-headed.  You develop a rash.  You have a fever. Get help right away if:  You have trouble breathing. This information is not intended to replace advice given to you by your health care provider. Make sure you discuss any questions you have with your health care provider. Document Released: 10/30/2015 Document Revised: 02/29/2016 Document Reviewed: 10/30/2015 Elsevier Interactive Patient  Education  2017 [employer].

## 2016-07-12 NOTE — Telephone Encounter (Signed)
Left message on voicemail in reference to upcoming appointment scheduled for 07/17/16. Phone number given for a call back so details instructions can be given. Ronnie Jackson

## 2016-07-12 NOTE — Anesthesia Postprocedure Evaluation (Signed)
Anesthesia Post Note  Patient: Ronnie Jackson  Procedure(s) Performed: Procedure(s) (LRB): CARDIOVERSION (N/A)  Patient location during evaluation: Endoscopy Anesthesia Type: General Level of consciousness: awake and alert Pain management: pain level controlled Vital Signs Assessment: post-procedure vital signs reviewed and stable Respiratory status: spontaneous breathing, nonlabored ventilation and respiratory function stable Cardiovascular status: blood pressure returned to baseline and stable Postop Assessment: no signs of nausea or vomiting Anesthetic complications: no       Last Vitals:  Vitals:   07/12/16 1420 07/12/16 1430  BP: 112/69 110/71  Pulse: (!) 45 (!) 55  Resp: 15 15    Last Pain:  Vitals:   07/12/16 1407  TempSrc: Oral                 [name]

## 2016-07-12 NOTE — Addendum Note (Signed)
Addended by: Stanton Kidney on: 07/12/2016 08:32 AM   Modules accepted: Orders

## 2016-07-12 NOTE — CV Procedure (Signed)
    CARDIOVERSION NOTE  Procedure: Electrical Cardioversion Indications:  Atrial Fibrillation  Procedure Details:  Consent: Risks of procedure as well as the alternatives and risks of each were explained to the (patient/caregiver).  Consent for procedure obtained.  Time Out: Verified patient identification, verified procedure, site/side was marked, verified correct patient position, special equipment/implants available, medications/allergies/relevent history reviewed, required imaging and test results available.  Performed  Patient placed on cardiac monitor, pulse oximetry, supplemental oxygen as necessary.  Sedation given: Propofol per anesthesia Pacer pads placed anterior and posterior chest.  Cardioverted 2 time(s).  Cardioverted at 150J and 200J biphasic.  Impression: Findings: Post procedure EKG shows: NSR Complications: None Patient did tolerate procedure well.  Plan: 1. Successful DCCV to NSR after 2 shocks.  Time Spent Directly with the Patient:  30 minutes   Pixie Casino, MD, [facility] Attending Cardiologist [place] 07/12/2016, 2:26 PM

## 2016-07-12 NOTE — Anesthesia Preprocedure Evaluation (Signed)
Anesthesia Evaluation  Patient identified by MRN, date of birth, ID band Patient awake    Reviewed: Allergy & Precautions, NPO status , Patient's Chart, lab work & pertinent test results  History of Anesthesia Complications Negative for: history of anesthetic complications  Airway Mallampati: II  TM Distance: >3 FB Neck ROM: Full    Dental  (+) Dental Advisory Given   Pulmonary neg pulmonary ROS,    breath sounds clear to auscultation       Cardiovascular hypertension, Pt. on medications + dysrhythmias Atrial Fibrillation  Rhythm:Irregular     Neuro/Psych PSYCHIATRIC DISORDERS Anxiety Depression  Neuromuscular disease    GI/Hepatic negative GI ROS, Neg liver ROS,   Endo/Other  diabetes, Type 2  Renal/GU negative Renal ROS     Musculoskeletal  (+) Arthritis ,   Abdominal   Peds  Hematology negative hematology ROS (+)   Anesthesia Other Findings   Reproductive/Obstetrics                             Anesthesia Physical Anesthesia Plan  ASA: II  Anesthesia Plan: General   Post-op Pain Management:    Induction: Intravenous  Airway Management Planned: Mask  Additional Equipment: None  Intra-op Plan:   Post-operative Plan:   Informed Consent: I have reviewed the patients History and Physical, chart, labs and discussed the procedure including the risks, benefits and alternatives for the proposed anesthesia with the patient or authorized representative who has indicated his/her understanding and acceptance.   Dental advisory given  Plan Discussed with: CRNA and Surgeon  Anesthesia Plan Comments:         Anesthesia Quick Evaluation

## 2016-07-12 NOTE — Transfer of Care (Signed)
Immediate Anesthesia Transfer of Care Note  Patient: Ronnie Jackson  Procedure(s) Performed: Procedure(s): CARDIOVERSION (N/A)  Patient Location: Endoscopy Unit  Anesthesia Type:General  Level of Consciousness: awake, alert  and oriented  Airway & Oxygen Therapy: Patient Spontanous Breathing  Post-op Assessment: Report given to RN and Post -op Vital signs reviewed and stable  Post vital signs: Reviewed and stable  Last Vitals:  Vitals:   07/12/16 1319  BP: (!) 142/95  Pulse: 67  Resp: 17    Last Pain:  Vitals:   07/12/16 1319  TempSrc: Oral         Complications: No apparent anesthesia complications

## 2016-07-12 NOTE — Addendum Note (Signed)
Addendum  created 07/12/16 1552 by Oleta Mouse, MD   SmartForm saved

## 2016-07-12 NOTE — H&P (Signed)
    INTERVAL PROCEDURE H&P  History and Physical Interval Note:  07/12/2016 1:51 PM  Ronnie Jackson has presented today for their planned procedure. The various methods of treatment have been discussed with the patient and family. After consideration of risks, benefits and other options for treatment, the patient has consented to the procedure.  The patients' outpatient history has been reviewed, patient examined, and no change in status from most recent office note within the past 30 days. I have reviewed the patients' chart and labs and will proceed as planned. Questions were answered to the patient's satisfaction.   Pixie Casino, MD, [facility] Attending Cardiologist [facility]  Pixie Casino 07/12/2016, 1:51 PM

## 2016-07-13 ENCOUNTER — Encounter (HOSPITAL_COMMUNITY): Payer: Self-pay | Admitting: Internal Medicine

## 2016-07-17 ENCOUNTER — Ambulatory Visit (HOSPITAL_COMMUNITY): Payer: PPO | Attending: Internal Medicine

## 2016-07-17 DIAGNOSIS — E119 Type 2 diabetes mellitus without complications: Secondary | ICD-10-CM | POA: Diagnosis not present

## 2016-07-17 DIAGNOSIS — R5383 Other fatigue: Secondary | ICD-10-CM | POA: Insufficient documentation

## 2016-07-17 DIAGNOSIS — R0600 Dyspnea, unspecified: Secondary | ICD-10-CM | POA: Insufficient documentation

## 2016-07-17 DIAGNOSIS — I481 Persistent atrial fibrillation: Secondary | ICD-10-CM

## 2016-07-17 DIAGNOSIS — I4891 Unspecified atrial fibrillation: Secondary | ICD-10-CM | POA: Diagnosis not present

## 2016-07-17 DIAGNOSIS — I4819 Other persistent atrial fibrillation: Secondary | ICD-10-CM

## 2016-07-17 DIAGNOSIS — I10 Essential (primary) hypertension: Secondary | ICD-10-CM | POA: Diagnosis not present

## 2016-07-17 DIAGNOSIS — Z79899 Other long term (current) drug therapy: Secondary | ICD-10-CM

## 2016-07-17 MED ORDER — TECHNETIUM TC 99M TETROFOSMIN IV KIT
30.5000 | PACK | Freq: Once | INTRAVENOUS | Status: AC | PRN
  Administered 2016-07-17: 30.5 via INTRAVENOUS
  Filled 2016-07-17: qty 31

## 2016-07-20 ENCOUNTER — Other Ambulatory Visit: Payer: Self-pay | Admitting: *Deleted

## 2016-07-20 DIAGNOSIS — G894 Chronic pain syndrome: Secondary | ICD-10-CM

## 2016-07-20 DIAGNOSIS — M25561 Pain in right knee: Secondary | ICD-10-CM

## 2016-07-20 DIAGNOSIS — M25562 Pain in left knee: Principal | ICD-10-CM

## 2016-07-20 MED ORDER — MORPHINE SULFATE 30 MG PO TABS
ORAL_TABLET | ORAL | 0 refills | Status: DC
Start: 2016-07-20 — End: 2016-08-21

## 2016-07-20 MED FILL — MORPHINE SULFATE IR 30 MG T: 30 | 30 days supply | Qty: 120 | Fill #0

## 2016-07-20 NOTE — Telephone Encounter (Signed)
Patient requested and will pick up 

## 2016-07-24 ENCOUNTER — Ambulatory Visit (HOSPITAL_COMMUNITY): Payer: PPO

## 2016-07-24 DIAGNOSIS — Z79899 Other long term (current) drug therapy: Secondary | ICD-10-CM

## 2016-07-24 DIAGNOSIS — R0600 Dyspnea, unspecified: Secondary | ICD-10-CM | POA: Diagnosis not present

## 2016-07-24 DIAGNOSIS — I481 Persistent atrial fibrillation: Secondary | ICD-10-CM

## 2016-07-24 LAB — MYOCARDIAL PERFUSION IMAGING
Peak HR: 83 {beats}/min
RATE: 0.36
Rest HR: 68 {beats}/min
SDS: 1
SRS: 7
SSS: 6
TID: 0.78

## 2016-07-24 MED ORDER — REGADENOSON 0.4 MG/5ML IV SOLN
0.4000 mg | Freq: Once | INTRAVENOUS | Status: AC
  Administered 2016-07-24: 0.4 mg via INTRAVENOUS

## 2016-07-24 MED ORDER — TECHNETIUM TC 99M TETROFOSMIN IV KIT
33.0000 | PACK | Freq: Once | INTRAVENOUS | Status: AC | PRN
  Administered 2016-07-24: 33 via INTRAVENOUS
  Filled 2016-07-24: qty 33

## 2016-07-30 MED FILL — ELIQUIS 5 MG TABLET: 5 | 30 days supply | Qty: 60 | Fill #1

## 2016-08-06 ENCOUNTER — Other Ambulatory Visit: Payer: Self-pay | Admitting: *Deleted

## 2016-08-06 DIAGNOSIS — I4819 Other persistent atrial fibrillation: Secondary | ICD-10-CM

## 2016-08-06 MED ORDER — FLECAINIDE ACETATE 50 MG PO TABS: 50.0000 mg | ORAL_TABLET | Freq: Two times a day (BID) | ORAL | 3 refills | Status: DC

## 2016-08-06 NOTE — Telephone Encounter (Signed)
Pt comes in to see Dr. Curt Bears at the end of this month. Can fill for a couple months.  Will address continuing medication at next OV.

## 2016-08-06 NOTE — Telephone Encounter (Signed)
Okay to go ahead and refill this for this year?

## 2016-08-07 MED FILL — FLECAINIDE ACETATE 50 MG TA: 50 | 30 days supply | Qty: 60 | Fill #0

## 2016-08-08 MED FILL — FUROSEMIDE 40 MG TABLET: 40 | 30 days supply | Qty: 60 | Fill #3

## 2016-08-17 ENCOUNTER — Other Ambulatory Visit: Payer: PPO

## 2016-08-17 DIAGNOSIS — E876 Hypokalemia: Secondary | ICD-10-CM | POA: Diagnosis not present

## 2016-08-18 LAB — BASIC METABOLIC PANEL
BUN: 14 mg/dL (ref 7–25)
CO2: 24 mmol/L (ref 20–31)
Calcium: 8.6 mg/dL (ref 8.6–10.3)
Chloride: 103 mmol/L (ref 98–110)
Creat: 1.3 mg/dL — ABNORMAL HIGH (ref 0.70–1.18)
Glucose, Bld: 119 mg/dL — ABNORMAL HIGH (ref 65–99)
Potassium: 3.7 mmol/L (ref 3.5–5.3)
Sodium: 142 mmol/L (ref 135–146)

## 2016-08-21 ENCOUNTER — Ambulatory Visit (INDEPENDENT_AMBULATORY_CARE_PROVIDER_SITE_OTHER): Payer: PPO | Admitting: Cardiology

## 2016-08-21 ENCOUNTER — Encounter: Payer: Self-pay | Admitting: Cardiology

## 2016-08-21 ENCOUNTER — Ambulatory Visit: Payer: PPO | Admitting: Cardiology

## 2016-08-21 ENCOUNTER — Other Ambulatory Visit: Payer: Self-pay | Admitting: Internal Medicine

## 2016-08-21 ENCOUNTER — Encounter: Payer: Self-pay | Admitting: Internal Medicine

## 2016-08-21 ENCOUNTER — Ambulatory Visit (INDEPENDENT_AMBULATORY_CARE_PROVIDER_SITE_OTHER): Payer: PPO | Admitting: Internal Medicine

## 2016-08-21 VITALS — BP 136/86 | HR 79 | Ht 69.0 in | Wt 225.0 lb

## 2016-08-21 VITALS — BP 138/82 | HR 89 | Temp 99.6°F | Resp 17 | Ht 70.0 in | Wt 223.8 lb

## 2016-08-21 DIAGNOSIS — E876 Hypokalemia: Secondary | ICD-10-CM

## 2016-08-21 DIAGNOSIS — I4819 Other persistent atrial fibrillation: Secondary | ICD-10-CM

## 2016-08-21 DIAGNOSIS — J111 Influenza due to unidentified influenza virus with other respiratory manifestations: Secondary | ICD-10-CM | POA: Diagnosis not present

## 2016-08-21 DIAGNOSIS — G894 Chronic pain syndrome: Secondary | ICD-10-CM

## 2016-08-21 DIAGNOSIS — R609 Edema, unspecified: Secondary | ICD-10-CM | POA: Diagnosis not present

## 2016-08-21 DIAGNOSIS — I481 Persistent atrial fibrillation: Secondary | ICD-10-CM

## 2016-08-21 DIAGNOSIS — E1142 Type 2 diabetes mellitus with diabetic polyneuropathy: Secondary | ICD-10-CM | POA: Diagnosis not present

## 2016-08-21 DIAGNOSIS — M25562 Pain in left knee: Principal | ICD-10-CM

## 2016-08-21 DIAGNOSIS — M25561 Pain in right knee: Secondary | ICD-10-CM

## 2016-08-21 MED ORDER — PHENYLEPHRINE-DM-GG 5-10-200 MG PO TABS: ORAL_TABLET | ORAL | 3 refills | Status: DC

## 2016-08-21 MED ORDER — OSELTAMIVIR PHOSPHATE 75 MG PO CAPS: ORAL_CAPSULE | ORAL | 0 refills | Status: DC

## 2016-08-21 MED ORDER — MORPHINE SULFATE 30 MG PO TABS: ORAL_TABLET | ORAL | 0 refills | Status: DC

## 2016-08-21 MED FILL — OSELTAMIVIR PHOS 75 MG CAP: 75 | 5 days supply | Qty: 10 | Fill #0

## 2016-08-21 MED FILL — MORPHINE SULFATE IR 30 MG T: 30 | 30 days supply | Qty: 120 | Fill #0

## 2016-08-21 NOTE — Progress Notes (Signed)
Facility  [place]    Place of Service:   OFFICE    Allergies  Allergen Reactions  . Indocin [Indomethacin]   . Methadone     Chief Complaint  Patient presents with  . Medical Management of Chronic Issues    5 month follow up    HPI:  Had cardioversion on 07/13/16 for persistent AF. Now on Eliquis. Flecainide stopped today by cardiology.  Flu symptoms started last night. Temp 103. Cough. Rattling in the chest. Wife has been sick 4 days with similar symptoms.  Medications: Patient's Medications  New Prescriptions   No medications on file  Previous Medications   APIXABAN (ELIQUIS) 5 MG TABS TABLET    Take 1 tablet (5 mg total) by mouth 2 (two) times daily. For Afib   CROMOLYN (OPTICROM) 4 % OPHTHALMIC SOLUTION    Place 1 drop into both eyes 4 (four) times daily.   FUROSEMIDE (LASIX) 40 MG TABLET    One twice daily to prevent fluid retention.   MORPHINE (MSIR) 30 MG TABLET    Take two tablets by mouth every 12 hours as needed for pain   SULFAMETHOXAZOLE-TRIMETHOPRIM (BACTRIM DS,SEPTRA DS) 800-160 MG TABLET    One twice daily to control infection  Modified Medications   No medications on file  Discontinued Medications   No medications on file    Review of Systems  Constitutional: Positive for fatigue. Negative for activity change, appetite change, chills and fever.  HENT: Negative for congestion, ear pain, postnasal drip, sinus pressure, sore throat and trouble swallowing.   Eyes: Negative for visual disturbance.  Respiratory: Positive for cough. Negative for chest tightness and shortness of breath.   Cardiovascular: Positive for leg swelling. Negative for chest pain and palpitations.       New diagnosis of AF on 03/06/16, but EKG from 2016 suggests he had the problem then. Cardioverted 07/13/16. Taken off flecainide 08/21/16.  Gastrointestinal: Negative for abdominal pain, blood in stool, nausea and vomiting.  Genitourinary: Negative for difficulty urinating, frequency and  urgency.       History of rectovesical fistula. Recurrent urinary tract infections.  Musculoskeletal: Positive for arthralgias, back pain, myalgias, neck pain and neck stiffness. Negative for gait problem.  Skin: Negative for rash.  Neurological: Positive for numbness (In both feet). Negative for dizziness, weakness and headaches.       Paresthesias in hands and lower forearms likely related to known cervical spine disease.  Psychiatric/Behavioral: Positive for sleep disturbance. Negative for confusion. The patient is not nervous/anxious.     Vitals:   08/21/16 1340  BP: 138/82  Pulse: 89  Resp: 17  Temp: 99.6 F (37.6 C)  TempSrc: Oral  SpO2: 96%  Weight: 223 lb 12.8 oz (101.5 kg)  Height: '5\' 10"'$  (1.778 m)   Body mass index is 32.11 kg/m. Wt Readings from Last 3 Encounters:  08/21/16 223 lb 12.8 oz (101.5 kg)  08/21/16 225 lb (102.1 kg)  07/17/16 226 lb (102.5 kg)      Physical Exam  Constitutional: He is oriented to person, place, and time. He appears well-developed and well-nourished. No distress.  Overweight  HENT:  Right Ear: External ear normal.  Left Ear: External ear normal.  Nose: Nose normal.  Mouth/Throat: Oropharynx is clear and moist. No oropharyngeal exudate.  Significant partial hearing loss  Eyes: Conjunctivae and EOM are normal. Pupils are equal, round, and reactive to light.  Neck: No JVD present. No tracheal deviation present. No thyromegaly present.  Cardiovascular:  Normal rate and intact distal pulses.  Exam reveals no gallop and no friction rub.   No murmur heard. New finding on 03/06/16: Atrial fib. Rate controlled at 67 BPM.  Pulmonary/Chest: No respiratory distress. He has no wheezes. He has rales (right lower lobe posteriorly). He exhibits no tenderness.  Abdominal: He exhibits no distension and no mass. There is no tenderness.  Musculoskeletal: He exhibits edema (Increased lower leg edema with the left leg greater than the right) and  tenderness.  Multiple painful areas including the neck, shoulders, back, hips, and knees. Diminished range of motion at the shoulders. Increased pain with flexion, extension, rotation of the back Increased pain with abduction or flexion of the right hip. Pain with palpation of the groin area. No bulge. Pain with extension of either knee associated with crepitus.  Lymphadenopathy:    He has no cervical adenopathy.  Neurological: He is alert and oriented to person, place, and time. He has normal reflexes. No cranial nerve deficit. Coordination normal.  Decreased sensation to Vibration and monofilament  Skin: No rash noted. No erythema. No pallor.  Psychiatric: He has a normal mood and affect. His behavior is normal. Judgment and thought content normal.    Labs reviewed: Lab Summary Latest Ref Rng & Units 08/17/2016 07/06/2016 03/15/2016 03/02/2016 02/06/2016  Hemoglobin 13.2 - 17.1 g/dL (None) 13.1(L) (None) (None) 12.8(L)  Hematocrit 38.5 - 50.0 % (None) 38.7 (None) (None) 39.5  White count 3.8 - 10.8 K/uL (None) 6.3 (None) (None) 6.7  Platelet count 140 - 400 K/uL (None) 164 (None) (None) 112(L)  Sodium 135 - 146 mmol/L 142 141 135 140 142  Potassium 3.5 - 5.3 mmol/L 3.7 4.3 3.2(L) 3.8 4.6  Calcium 8.6 - 10.3 mg/dL 8.6 8.8 8.9 8.1(L) 8.3(L)  Phosphorus - (None) (None) (None) (None) (None)  Creatinine 0.70 - 1.18 mg/dL 1.30(H) 1.53(H) 1.91(H) 1.39(H) 1.25(H)  AST 10 - 35 U/L (None) (None) (None) (None) 23  Alk Phos 40 - 115 U/L (None) (None) (None) (None) 77  Bilirubin 0.2 - 1.2 mg/dL (None) (None) (None) (None) 0.8  Glucose 65 - 99 mg/dL 119(H) 120(H) 112(H) 109(H) 111(H)  Cholesterol 125 - 200 mg/dL (None) (None) (None) (None) 150  HDL cholesterol >=40 mg/dL (None) (None) (None) (None) 38(L)  Triglycerides <150 mg/dL (None) (None) (None) (None) 129  LDL Direct - (None) (None) (None) (None) (None)  LDL Calc <130 mg/dL (None) (None) (None) (None) 86  Total protein 6.1 - 8.1 g/dL (None)  (None) (None) (None) 6.3  Albumin 3.6 - 5.1 g/dL (None) (None) (None) (None) 4.0  Some recent data might be hidden   No results found for: TSH, T3TOTAL, T4TOTAL, THYROIDAB Lab Results  Component Value Date   BUN 14 08/17/2016   BUN 10 07/06/2016   BUN 35 (H) 03/15/2016   Lab Results  Component Value Date   HGBA1C 6.6 (H) 02/06/2016   HGBA1C 6.5 (H) 03/25/2015   HGBA1C 6.3 (H) 12/22/2014    Assessment/Plan 1. Influenza - oseltamivir (TAMIFLU) 75 MG capsule; Take one capsule twice daily to treat the flu  Dispense: 10 capsule; Refill: 0 - Phenylephrine-DM-GG 5-10-200 MG TABS; 2 every 6 hours as needed to control cough and congestion  Dispense: 24 tablet; Refill: 3

## 2016-08-21 NOTE — Progress Notes (Signed)
Electrophysiology Office Note   Date:  08/21/2016   ID:  Ronnie Jackson, DOB [birth year], MRN [id number]  PCP:  Jeanmarie Hubert, MD  Primary Electrophysiologist:  Devinne Epstein Meredith Leeds, MD    Chief Complaint  Patient presents with  . Follow-up    Persistent Afib     History of Present Illness: Ronnie Jackson is a 81 y.o. male who presents today for electrophysiology evaluation.   Has a history of AF with cardioversion 07/12/16. He is back in atrial fibrillation today. He says that he is feeling achy all over with fevers up to 102. He has an appointment with his primary doctor for possible flu screening. He also says that he is quite fatigued, which could be due to his atrial fibrillation.   Today, he denies symptoms of palpitations, chest pain, shortness of breath, orthopnea, PND, lower extremity edema, claudication, dizziness, presyncope, syncope, bleeding, or neurologic sequela. The patient is tolerating medications without difficulties and is otherwise without complaint today.    Past Medical History:  Diagnosis Date  . Anxiety state, unspecified   . Atrial fibrillation ([place]) 03/06/2016  . Chronic pain syndrome   . Constipation   . Depressive disorder, not elsewhere classified   . Dysphagia, unspecified(787.20)   . Herpes simplex disease   . Hyperlipidemia   . Hypertension   . Hypopotassemia   . Insomnia, unspecified   . Intestinovesical fistula   . Malignant neoplasm of prostate ([place])   . Myalgia and myositis, unspecified   . Osteoarthritis   . Pain in joint, lower leg    Bilateral knee pains  . Reflux esophagitis   . Rheumatoid arthritis with rheumatoid factor (HCC)   . Spinal stenosis, unspecified region other than cervical   . Thrombocytopenia, unspecified   . Type II or unspecified type diabetes mellitus without mention of complication, not stated as uncontrolled   . Unspecified arthropathy, pelvic region and thigh   . Unspecified hereditary and idiopathic  peripheral neuropathy    Past Surgical History:  Procedure Laterality Date  . CARDIAC CATHETERIZATION  01/31/2004   Dr Gwenlyn Found  . CARDIOVERSION N/A 07/12/2016   Procedure: CARDIOVERSION;  Surgeon: Pixie Casino, MD;  Location: [facility];  Service: Cardiovascular;  Laterality: N/A;  . COLONOSCOPY  08/09/2006   Dr Christian Mate, hemorrhoids/rectal fistula  . cystocopy  07/2006   Dr Harlow Asa  . NASAL SINUS SURGERY  1992  . PROSTATE SURGERY  1991     Current Outpatient Prescriptions  Medication Sig Dispense Refill  . apixaban (ELIQUIS) 5 MG TABS tablet Take 1 tablet (5 mg total) by mouth 2 (two) times daily. For Afib 60 tablet 1  . cromolyn (OPTICROM) 4 % ophthalmic solution Place 1 drop into both eyes 4 (four) times daily.    . flecainide (TAMBOCOR) 50 MG tablet Take 1 tablet (50 mg total) by mouth 2 (two) times daily. 60 tablet 3  . furosemide (LASIX) 40 MG tablet One twice daily to prevent fluid retention. 60 tablet 5  . morphine (MSIR) 30 MG tablet Take two tablets by mouth every 12 hours as needed for pain 120 tablet 0  . sulfamethoxazole-trimethoprim (BACTRIM DS,SEPTRA DS) 800-160 MG tablet One twice daily to control infection 60 tablet 5   No current facility-administered medications for this visit.     Allergies:   Indocin [indomethacin] and Methadone   Social History:  The patient  reports that he has never smoked. His smokeless tobacco use includes Chew. He reports that he does not  drink alcohol or use drugs.   Family History:  The patient's family history includes Arthritis in his sister; Heart failure in his mother; Lung cancer in his daughter; Prostate cancer in his father.    ROS:  Please see the history of present illness.   Otherwise, review of systems is positive for Leg swelling, leg pain, cough, dyspnea on exertion, back pain, balance problems.   All other systems are reviewed and negative.    PHYSICAL EXAM:Neck swelling, leg pain, cough, dyspnea on exertion, back  pain, balance problems VS:  BP 136/86   Pulse 79   Ht 5\' 9"  (1.753 m)   Wt 225 lb (102.1 kg)   BMI 33.23 kg/m  , BMI Body mass index is 33.23 kg/m. GEN: Well nourished, well developed, in no acute distress  HEENT: normal  Neck: no JVD, carotid bruits, or masses Cardiac: iRRR; no murmurs, rubs, or gallops, 2+percent edema  Respiratory:  clear to auscultation bilaterally, normal work of breathing GI: soft, nontender, nondistended, + BS MS: no deformity or atrophy  Skin: warm and dry Neuro:  Strength and sensation are intact Psych: euthymic mood, full affect  EKG:  EKG is ordered today. Personal review of the ekg ordered shows atrial fibrillation, rate 79   Recent Lab//s: 02/06/2016: ALT 16 07/06/2016: Hemoglobin 13.1; Platelets 164 08/17/2016: BUN 14; Creat 1.30; Potassium 3.7; Sodium 142    Lipid Panel     Component Value Date/Time   CHOL 150 02/06/2016 1227   CHOL 161 07/01/2015 0944   TRIG 129 02/06/2016 1227   HDL 38 (L) 02/06/2016 1227   HDL 33 (L) 07/01/2015 0944   CHOLHDL 3.9 02/06/2016 1227   VLDL 26 02/06/2016 1227   LDLCALC 86 02/06/2016 1227   LDLCALC 95 07/01/2015 0944     Wt Readings from Last 3 Encounters:  08/21/16 225 lb (102.1 kg)  07/17/16 226 lb (102.5 kg)  07/12/16 226 lb (102.5 kg)      Other studies Reviewed: Additional studies/ records that were reviewed today include: TTE 04/19/16 - Left ventricle: The cavity size was normal. Wall thickness was   increased in a pattern of mild LVH. Systolic function was   vigorous. The estimated ejection fraction was in the range of 65%   to 70%. Wall motion was normal; there were no regional wall   motion abnormalities. - Aortic valve: Trileaflet; mildly thickened, moderately calcified   leaflets. - Aorta: Ascending aortic diameter: 41 mm (S). - Ascending aorta: The ascending aorta was mildly dilated. - Left atrium: The atrium was normal in size. Volume/bsa, ES   (1-plane Simpson&'s, A4C): 29.8  ml/m^2.   ASSESSMENT AND PLAN:  1.  Persistent atrial fibrillation: On eliquis. Was started on flecainide 50 mg with cardioversion 07/12/16. Back out of rhythm today, which could be either a flecainide failure or due to his acute respiratory illness. I Jeania Nater stop the flecainide today and see him back in one month with a further discussion on antiarrhythmic medications and return to sinus rhythm.   This patients CHA2DS2-VASc Score and unadjusted Ischemic Stroke Rate (% per year) is equal to 4.8 % stroke rate/year from a score of 4  Above score calculated as 1 point each if present [CHF, HTN, DM, Vascular=MI/PAD/Aortic Plaque, Age if 65-74, or Male] Above score calculated as 2 points each if present [Age > 75, or Stroke/TIA/TE]  2. Hypertension: well controlled today   3. LE edema: Taking Lasix a day. Continue current management  4. Fevers, whole body aches:  Possibly related to flu. He does have an appointment with his primary doctor today.  Current medicines are reviewed at length with the patient today.   The patient does not have concerns regarding his medicines.  The following changes were made today: stop flecainide  Labs/ tests ordered today include: ECG No orders of the defined types were placed in this encounter.    Disposition:   FU with Jyles Sontag 1 months  Signed, Jermaine Tholl Meredith Leeds, MD  08/21/2016 10:59 AM     [facility] [address] [place] [address] [phone] (office) [phone] (fax)

## 2016-08-21 NOTE — Patient Instructions (Signed)
Medication Instructions:    Your physician has recommended you make the following change in your medication: 1) STOP Flecainide  --- If you need a refill on your cardiac medications before your next appointment, please call your pharmacy. ---  Labwork:  None ordered  Testing/Procedures:  None ordered  Follow-Up:  Your physician recommends that you schedule a follow-up appointment in: 4 weeks with Dr. Curt Bears.  The office will call you to arrange this appointment.  Thank you for choosing [facility]!!   Trinidad Curet, RN [phone]

## 2016-08-24 ENCOUNTER — Telehealth: Payer: Self-pay | Admitting: Internal Medicine

## 2016-08-24 NOTE — Telephone Encounter (Signed)
left msg asking pt to call and schedule AWV. VDM (DD)

## 2016-09-05 NOTE — Telephone Encounter (Signed)
left another msg asking pt to confirm this AWV appt w/ nurse. VDM (DD) °

## 2016-09-12 ENCOUNTER — Encounter: Payer: Self-pay | Admitting: Cardiology

## 2016-09-13 MED FILL — ELIQUIS 5 MG TABLET: 5 | 30 days supply | Qty: 60 | Fill #2

## 2016-09-19 ENCOUNTER — Ambulatory Visit: Payer: PPO

## 2016-09-20 ENCOUNTER — Other Ambulatory Visit: Payer: Self-pay

## 2016-09-20 DIAGNOSIS — M25561 Pain in right knee: Secondary | ICD-10-CM

## 2016-09-20 DIAGNOSIS — G894 Chronic pain syndrome: Secondary | ICD-10-CM

## 2016-09-20 DIAGNOSIS — M25562 Pain in left knee: Principal | ICD-10-CM

## 2016-09-20 MED ORDER — MORPHINE SULFATE 30 MG PO TABS: ORAL_TABLET | ORAL | 0 refills | Status: DC

## 2016-09-20 MED FILL — MORPHINE SULFATE IR 30 MG T: 30 | 30 days supply | Qty: 120 | Fill #0

## 2016-09-21 ENCOUNTER — Encounter (INDEPENDENT_AMBULATORY_CARE_PROVIDER_SITE_OTHER): Payer: Self-pay

## 2016-09-21 ENCOUNTER — Ambulatory Visit (INDEPENDENT_AMBULATORY_CARE_PROVIDER_SITE_OTHER): Payer: PPO | Admitting: Cardiology

## 2016-09-21 ENCOUNTER — Encounter: Payer: Self-pay | Admitting: Cardiology

## 2016-09-21 VITALS — BP 136/88 | HR 80 | Ht 70.0 in | Wt 223.6 lb

## 2016-09-21 DIAGNOSIS — I4819 Other persistent atrial fibrillation: Secondary | ICD-10-CM

## 2016-09-21 DIAGNOSIS — I481 Persistent atrial fibrillation: Secondary | ICD-10-CM | POA: Diagnosis not present

## 2016-09-21 NOTE — Patient Instructions (Addendum)
Medication Instructions:  Your physician recommends that you continue on your current medications as directed. Please refer to the Current Medication list given to you today.  If you need a refill on your cardiac medications before your next appointment, please call your pharmacy.   Labwork: None ordered  Testing/Procedures: None ordered  Follow-Up: Your physician wants you to follow-up in: 6 months with Dr. Camnitz.  You will receive a reminder letter in the mail two months in advance. If you don't receive a letter, please call our office to schedule the follow-up appointment.  Thank you for choosing [facility]!!   Paullette Mckain, RN [phone]         

## 2016-09-21 NOTE — Progress Notes (Signed)
Electrophysiology Office Note   Date:  09/21/2016   ID:  PLEDGER DEBENEDETTI, DOB 11-Jun-1936, MRN [id number]  PCP:  Jeanmarie Hubert, MD  Primary Electrophysiologist:  Aquila Menzie Meredith Leeds, MD    Chief Complaint  Patient presents with  . Follow-up    Persistent Afib     History of Present Illness: Ronnie Jackson is a 81 y.o. male who presents today for electrophysiology evaluation.   Has a history of AF with cardioversion 07/12/16. He is back in atrial fibrillation. He says that he feels well without major complaint. He does not have any chest pain, shortness of breath, or fatigue.   Today, he denies symptoms of palpitations, chest pain, shortness of breath, orthopnea, PND, lower extremity edema, claudication, dizziness, presyncope, syncope, bleeding, or neurologic sequela. The patient is tolerating medications without difficulties and is otherwise without complaint today.    Past Medical History:  Diagnosis Date  . Anxiety state, unspecified   . Atrial fibrillation ([facility]) 03/06/2016  . Chronic pain syndrome   . Constipation   . Depressive disorder, not elsewhere classified   . Dysphagia, unspecified(787.20)   . Herpes simplex disease   . Hyperlipidemia   . Hypertension   . Hypopotassemia   . Insomnia, unspecified   . Intestinovesical fistula   . Malignant neoplasm of prostate ([place])   . Myalgia and myositis, unspecified   . Osteoarthritis   . Pain in joint, lower leg    Bilateral knee pains  . Reflux esophagitis   . Rheumatoid arthritis with rheumatoid factor (HCC)   . Spinal stenosis, unspecified region other than cervical   . Thrombocytopenia, unspecified   . Type II or unspecified type diabetes mellitus without mention of complication, not stated as uncontrolled   . Unspecified arthropathy, pelvic region and thigh   . Unspecified hereditary and idiopathic peripheral neuropathy    Past Surgical History:  Procedure Laterality Date  . CARDIAC CATHETERIZATION  01/31/2004   Dr  Gwenlyn Found  . CARDIOVERSION N/A 07/12/2016   Procedure: CARDIOVERSION;  Surgeon: Pixie Casino, MD;  Location: [facility];  Service: Cardiovascular;  Laterality: N/A;  . COLONOSCOPY  08/09/2006   Dr Christian Mate, hemorrhoids/rectal fistula  . cystocopy  07/2006   Dr Harlow Asa  . NASAL SINUS SURGERY  1992  . PROSTATE SURGERY  1991     Current Outpatient Prescriptions  Medication Sig Dispense Refill  . apixaban (ELIQUIS) 5 MG TABS tablet Take 1 tablet (5 mg total) by mouth 2 (two) times daily. For Afib 60 tablet 1  . cromolyn (OPTICROM) 4 % ophthalmic solution Place 1 drop into both eyes 4 (four) times daily.    . furosemide (LASIX) 40 MG tablet One twice daily to prevent fluid retention. 60 tablet 5  . morphine (MSIR) 30 MG tablet Take two tablets by mouth every 12 hours as needed for pain 120 tablet 0  . sulfamethoxazole-trimethoprim (BACTRIM DS,SEPTRA DS) 800-160 MG tablet One twice daily to control infection 60 tablet 5   No current facility-administered medications for this visit.     Allergies:   Indocin [indomethacin] and Methadone   Social History:  The patient  reports that he has never smoked. His smokeless tobacco use includes Chew. He reports that he does not drink alcohol or use drugs.   Family History:  The patient's family history includes Arthritis in his sister; Heart failure in his mother; Lung cancer in his daughter; Prostate cancer in his father.    ROS:  Please see the history of  present illness.   Otherwise, review of systems is positive for Leg pain, balance problems, muscle pain, back pain, dizziness.   All other systems are reviewed and negative.    PHYSICAL EXAM:Neck swelling, leg pain, cough, dyspnea on exertion, back pain, balance problems VS:  BP 136/88   Pulse 80   Ht 5\' 10"  (1.778 m)   Wt 223 lb 9.6 oz (101.4 kg)   BMI 32.08 kg/m  , BMI Body mass index is 32.08 kg/m. GEN: Well nourished, well developed, in no acute distress  HEENT: normal  Neck: no  JVD, carotid bruits, or masses Cardiac: iRRR; no murmurs, rubs, or gallops, 2+percent edema  Respiratory:  clear to auscultation bilaterally, normal work of breathing GI: soft, nontender, nondistended, + BS MS: no deformity or atrophy  Skin: warm and dry Neuro:  Strength and sensation are intact Psych: euthymic mood, full affect  EKG:  EKG is ordered today. Personal review of the ekg ordered 08/21/16 shows atrial fibrillation, rate 79   Recent Lab//s: 02/06/2016: ALT 16 07/06/2016: Hemoglobin 13.1; Platelets 164 08/17/2016: BUN 14; Creat 1.30; Potassium 3.7; Sodium 142    Lipid Panel     Component Value Date/Time   CHOL 150 02/06/2016 1227   CHOL 161 07/01/2015 0944   TRIG 129 02/06/2016 1227   HDL 38 (L) 02/06/2016 1227   HDL 33 (L) 07/01/2015 0944   CHOLHDL 3.9 02/06/2016 1227   VLDL 26 02/06/2016 1227   LDLCALC 86 02/06/2016 1227   LDLCALC 95 07/01/2015 0944     Wt Readings from Last 3 Encounters:  09/21/16 223 lb 9.6 oz (101.4 kg)  08/21/16 223 lb 12.8 oz (101.5 kg)  08/21/16 225 lb (102.1 kg)      Other studies Reviewed: Additional studies/ records that were reviewed today include: TTE 04/19/16 - Left ventricle: The cavity size was normal. Wall thickness was   increased in a pattern of mild LVH. Systolic function was   vigorous. The estimated ejection fraction was in the range of 65%   to 70%. Wall motion was normal; there were no regional wall   motion abnormalities. - Aortic valve: Trileaflet; mildly thickened, moderately calcified   leaflets. - Aorta: Ascending aortic diameter: 41 mm (S). - Ascending aorta: The ascending aorta was mildly dilated. - Left atrium: The atrium was normal in size. Volume/bsa, ES   (1-plane Simpson&'s, A4C): 29.8 ml/m^2.   ASSESSMENT AND PLAN:  1.  Persistent atrial fibrillation: On eliquis. Was started on flecainide 50 mg with cardioversion 07/12/16. On auscultation, he is in atrial fibrillation today. He feels well without any  major complaints other than arthritis pain. We'll continue current management and plan for a rate control strategy.  This patients CHA2DS2-VASc Score and unadjusted Ischemic Stroke Rate (% per year) is equal to 4.8 % stroke rate/year from a score of 4  Above score calculated as 1 point each if present [CHF, HTN, DM, Vascular=MI/PAD/Aortic Plaque, Age if 65-74, or Male] Above score calculated as 2 points each if present [Age > 75, or Stroke/TIA/TE]  2. Hypertension: well controlled today   3. LE edema: Taking Lasix a day. Continue current management  4. Arthritis: Having pain all over his body. Per primary physician.  Current medicines are reviewed at length with the patient today.   The patient does not have concerns regarding his medicines.  The following changes were made today: stop flecainide  Labs/ tests ordered today include: ECG No orders of the defined types were placed in this encounter.  Disposition:   FU with Jaykob Minichiello 6 months  Signed, Quinesha Selinger Meredith Leeds, MD  09/21/2016 2:00 PM     [address] [place] [address] [phone] (office) [phone] (fax)

## 2016-10-12 MED FILL — FUROSEMIDE 40 MG TABLET: 40 | 30 days supply | Qty: 60 | Fill #4

## 2016-10-12 MED FILL — SULFAMETHOXAZOLE/TMP DS TAB: 800-160 | 30 days supply | Qty: 60 | Fill #1

## 2016-10-22 ENCOUNTER — Other Ambulatory Visit: Payer: Self-pay

## 2016-10-22 DIAGNOSIS — G894 Chronic pain syndrome: Secondary | ICD-10-CM

## 2016-10-22 DIAGNOSIS — M25562 Pain in left knee: Principal | ICD-10-CM

## 2016-10-22 DIAGNOSIS — M25561 Pain in right knee: Secondary | ICD-10-CM

## 2016-10-22 MED ORDER — MORPHINE SULFATE 30 MG PO TABS: ORAL_TABLET | ORAL | 0 refills | Status: DC

## 2016-10-22 MED FILL — MORPHINE SULFATE IR 30 MG T: 30 | 30 days supply | Qty: 120 | Fill #0

## 2016-10-22 NOTE — Telephone Encounter (Signed)
Ronnie Jackson called and requested medication stated that she will pick up when ready.

## 2016-10-28 MED FILL — ELIQUIS 5 MG TABLET: 5 | 30 days supply | Qty: 60 | Fill #3

## 2016-11-15 ENCOUNTER — Ambulatory Visit (INDEPENDENT_AMBULATORY_CARE_PROVIDER_SITE_OTHER): Payer: PPO

## 2016-11-15 ENCOUNTER — Other Ambulatory Visit: Payer: PPO

## 2016-11-15 VITALS — BP 144/88 | HR 86 | Temp 97.6°F | Ht 70.0 in | Wt 224.0 lb

## 2016-11-15 DIAGNOSIS — E876 Hypokalemia: Secondary | ICD-10-CM

## 2016-11-15 DIAGNOSIS — E1142 Type 2 diabetes mellitus with diabetic polyneuropathy: Secondary | ICD-10-CM

## 2016-11-15 DIAGNOSIS — Z0000 Encounter for general adult medical examination without abnormal findings: Secondary | ICD-10-CM

## 2016-11-15 DIAGNOSIS — H918X3 Other specified hearing loss, bilateral: Secondary | ICD-10-CM

## 2016-11-15 DIAGNOSIS — Z23 Encounter for immunization: Secondary | ICD-10-CM | POA: Diagnosis not present

## 2016-11-15 LAB — COMPREHENSIVE METABOLIC PANEL
ALT: 12 U/L (ref 9–46)
AST: 20 U/L (ref 10–35)
Albumin: 4.2 g/dL (ref 3.6–5.1)
Alkaline Phosphatase: 72 U/L (ref 40–115)
BUN: 12 mg/dL (ref 7–25)
CO2: 22 mmol/L (ref 20–31)
Calcium: 8.7 mg/dL (ref 8.6–10.3)
Chloride: 104 mmol/L (ref 98–110)
Creat: 1.23 mg/dL — ABNORMAL HIGH (ref 0.70–1.18)
Glucose, Bld: 122 mg/dL — ABNORMAL HIGH (ref 65–99)
Potassium: 3.6 mmol/L (ref 3.5–5.3)
Sodium: 144 mmol/L (ref 135–146)
Total Bilirubin: 0.9 mg/dL (ref 0.2–1.2)
Total Protein: 6.7 g/dL (ref 6.1–8.1)

## 2016-11-15 NOTE — Patient Instructions (Addendum)
[name] , Thank you for taking time to come for your Medicare Wellness Visit. I appreciate your ongoing commitment to your health goals. Please review the following plan we discussed and let me know if I can assist you in the future.   Screening recommendations/referrals: Colonoscopy up to date Recommended yearly ophthalmology/optometry visit for glaucoma screening and checkup Recommended yearly dental visit for hygiene and checkup  Vaccinations: Influenza vaccine up to date Pneumococcal vaccine up to date. Given today. Tdap vaccine due. If you decide you want this let us know and we will put in prescription.   Shingles vaccine not in records. If you decide you want this let us know and we will put in prescription.    Advanced directives: Advance directive discussed with you today. I have provided a copy for you to complete at home and have notarized. Once this is complete please bring a copy in to our office so we can scan it into your chart.  Conditions/risks identified: None  Next appointment: Dr. Nyoka Cowden 11/21/16 @ 3:15pm  Preventive Care 65 Years and Older, Male Preventive care refers to lifestyle choices and visits with your health care provider that can promote health and wellness. What does preventive care include?  A yearly physical exam. This is also called an annual well check.  Dental exams once or twice a year.  Routine eye exams. Ask your health care provider how often you should have your eyes checked.  Personal lifestyle choices, including:  Daily care of your teeth and gums.  Regular physical activity.  Eating a healthy diet.  Avoiding tobacco and drug use.  Limiting alcohol use.  Practicing safe sex.  Taking low doses of aspirin every day.  Taking vitamin and mineral supplements as recommended by your health care provider. What happens during an annual well check? The services and screenings done by your health care provider during your annual well  check will depend on your age, overall health, lifestyle risk factors, and family history of disease. Counseling  Your health care provider may ask you questions about your:  Alcohol use.  Tobacco use.  Drug use.  Emotional well-being.  Home and relationship well-being.  Sexual activity.  Eating habits.  History of falls.  Memory and ability to understand (cognition).  Work and work Statistician. Screening  You may have the following tests or measurements:  Height, weight, and BMI.  Blood pressure.  Lipid and cholesterol levels. These may be checked every 5 years, or more frequently if you are over 24 years old.  Skin check.  Lung cancer screening. You may have this screening every year starting at age 55 if you have a 30-pack-year history of smoking and currently smoke or have quit within the past 15 years.  Fecal occult blood test (FOBT) of the stool. You may have this test every year starting at age 40.  Flexible sigmoidoscopy or colonoscopy. You may have a sigmoidoscopy every 5 years or a colonoscopy every 10 years starting at age 52.  Prostate cancer screening. Recommendations will vary depending on your family history and other risks.  Hepatitis C blood test.  Hepatitis B blood test.  Sexually transmitted disease (STD) testing.  Diabetes screening. This is done by checking your blood sugar (glucose) after you have not eaten for a while (fasting). You may have this done every 1-3 years.  Abdominal aortic aneurysm (AAA) screening. You may need this if you are a current or former smoker.  Osteoporosis. You may be screened starting  at age 44 if you are at high risk. Talk with your health care provider about your test results, treatment options, and if necessary, the need for more tests. Vaccines  Your health care provider may recommend certain vaccines, such as:  Influenza vaccine. This is recommended every year.  Tetanus, diphtheria, and acellular  pertussis (Tdap, Td) vaccine. You may need a Td booster every 10 years.  Zoster vaccine. You may need this after age 36.  Pneumococcal 13-valent conjugate (PCV13) vaccine. One dose is recommended after age 44.  Pneumococcal polysaccharide (PPSV23) vaccine. One dose is recommended after age 61. Talk to your health care provider about which screenings and vaccines you need and how often you need them. This information is not intended to replace advice given to you by your health care provider. Make sure you discuss any questions you have with your health care provider. Document Released: 08/05/2015 Document Revised: 03/28/2016 Document Reviewed: 05/10/2015 Elsevier Interactive Patient Education  2017 [place] Prevention in the Home Falls can cause injuries. They can happen to people of all ages. There are many things you can do to make your home safe and to help prevent falls. What can I do on the outside of my home?  Regularly fix the edges of walkways and driveways and fix any cracks.  Remove anything that might make you trip as you walk through a door, such as a raised step or threshold.  Trim any bushes or trees on the path to your home.  Use bright outdoor lighting.  Clear any walking paths of anything that might make someone trip, such as rocks or tools.  Regularly check to see if handrails are loose or broken. Make sure that both sides of any steps have handrails.  Any raised decks and porches should have guardrails on the edges.  Have any leaves, snow, or ice cleared regularly.  Use sand or salt on walking paths during winter.  Clean up any spills in your garage right away. This includes oil or grease spills. What can I do in the bathroom?  Use night lights.  Install grab bars by the toilet and in the tub and shower. Do not use towel bars as grab bars.  Use non-skid mats or decals in the tub or shower.  If you need to sit down in the shower, use a plastic,  non-slip stool.  Keep the floor dry. Clean up any water that spills on the floor as soon as it happens.  Remove soap buildup in the tub or shower regularly.  Attach bath mats securely with double-sided non-slip rug tape.  Do not have throw rugs and other things on the floor that can make you trip. What can I do in the bedroom?  Use night lights.  Make sure that you have a light by your bed that is easy to reach.  Do not use any sheets or blankets that are too big for your bed. They should not hang down onto the floor.  Have a firm chair that has side arms. You can use this for support while you get dressed.  Do not have throw rugs and other things on the floor that can make you trip. What can I do in the kitchen?  Clean up any spills right away.  Avoid walking on wet floors.  Keep items that you use a lot in easy-to-reach places.  If you need to reach something above you, use a strong step stool that has a grab bar.  Keep electrical cords out of the way.  Do not use floor polish or wax that makes floors slippery. If you must use wax, use non-skid floor wax.  Do not have throw rugs and other things on the floor that can make you trip. What can I do with my stairs?  Do not leave any items on the stairs.  Make sure that there are handrails on both sides of the stairs and use them. Fix handrails that are broken or loose. Make sure that handrails are as long as the stairways.  Check any carpeting to make sure that it is firmly attached to the stairs. Fix any carpet that is loose or worn.  Avoid having throw rugs at the top or bottom of the stairs. If you do have throw rugs, attach them to the floor with carpet tape.  Make sure that you have a light switch at the top of the stairs and the bottom of the stairs. If you do not have them, ask someone to add them for you. What else can I do to help prevent falls?  Wear shoes that:  Do not have high heels.  Have rubber  bottoms.  Are comfortable and fit you well.  Are closed at the toe. Do not wear sandals.  If you use a stepladder:  Make sure that it is fully opened. Do not climb a closed stepladder.  Make sure that both sides of the stepladder are locked into place.  Ask someone to hold it for you, if possible.  Clearly mark and make sure that you can see:  Any grab bars or handrails.  First and last steps.  Where the edge of each step is.  Use tools that help you move around (mobility aids) if they are needed. These include:  Canes.  Walkers.  Scooters.  Crutches.  Turn on the lights when you go into a dark area. Replace any light bulbs as soon as they burn out.  Set up your furniture so you have a clear path. Avoid moving your furniture around.  If any of your floors are uneven, fix them.  If there are any pets around you, be aware of where they are.  Review your medicines with your doctor. Some medicines can make you feel dizzy. This can increase your chance of falling. Ask your doctor what other things that you can do to help prevent falls. This information is not intended to replace advice given to you by your health care provider. Make sure you discuss any questions you have with your health care provider. Document Released: 05/05/2009 Document Revised: 12/15/2015 Document Reviewed: 08/13/2014 Elsevier Interactive Patient Education  2017 [employer].

## 2016-11-15 NOTE — Progress Notes (Signed)
   I reviewed health advisor's note, was available for consultation and agree with the assessment and plan as written.  I will send this on to Dr. Nyoka Cowden to update some of these labs and injections in case he sees him again before I do.    Berta Denson L. Hanan Moen, D.O. [facility] [address], [place] [address] Cell Phone (Mon-Fri 8am-5pm):  [phone] On Call:  [phone] & follow prompts after 5pm & weekends Office Phone:  [phone] Office Fax:  [phone]   Quick Notes   Health Maintenance: PN23 given today. TDAP, HgA1c, foot exam, and eye exam due. Pt will make next eye appointment.     Abnormal Screen: MMSE 27/30. Passed clock drawing     Patient Concerns: Pain/arthritis is getting worse.     Nurse Concerns: Discussed getting the shingles and tdap vaccines. Discussed filling out advanced directives. Pt was borederline for all of them.

## 2016-11-15 NOTE — Progress Notes (Signed)
Subjective:   Ronnie Jackson is a 81 y.o. male who presents for an Initial Medicare Annual Wellness Visit.     Objective:    Today's Vitals   11/15/16 1001  BP: (!) 144/88  Pulse: 86  Temp: 97.6 F (36.4 C)  TempSrc: Oral  SpO2: 96%  Weight: 224 lb (101.6 kg)  Height: 5\' 10"  (1.778 m)   Body mass index is 32.14 kg/m.  Current Medications (verified) Outpatient Encounter Prescriptions as of 11/15/2016  Medication Sig  . apixaban (ELIQUIS) 5 MG TABS tablet Take 1 tablet (5 mg total) by mouth 2 (two) times daily. For Afib  . cromolyn (OPTICROM) 4 % ophthalmic solution Place 1 drop into both eyes 4 (four) times daily.  . furosemide (LASIX) 40 MG tablet One twice daily to prevent fluid retention.  Marland Kitchen morphine (MSIR) 30 MG tablet Take two tablets by mouth every 12 hours as needed for pain  . sulfamethoxazole-trimethoprim (BACTRIM DS,SEPTRA DS) 800-160 MG tablet One twice daily to control infection   No facility-administered encounter medications on file as of 11/15/2016.     Allergies (verified) Indocin [indomethacin] and Methadone   History: Past Medical History:  Diagnosis Date  . Anxiety state, unspecified   . Atrial fibrillation ([place]) 03/06/2016  . Chronic pain syndrome   . Constipation   . Depressive disorder, not elsewhere classified   . Dysphagia, unspecified(787.20)   . Herpes simplex disease   . Hyperlipidemia   . Hypertension   . Hypopotassemia   . Insomnia, unspecified   . Intestinovesical fistula   . Malignant neoplasm of prostate ([place])   . Myalgia and myositis, unspecified   . Osteoarthritis   . Pain in joint, lower leg    Bilateral knee pains  . Reflux esophagitis   . Rheumatoid arthritis with rheumatoid factor (HCC)   . Spinal stenosis, unspecified region other than cervical   . Thrombocytopenia, unspecified ([place])   . Type II or unspecified type diabetes mellitus without mention of complication, not stated as uncontrolled   . Unspecified  arthropathy, pelvic region and thigh   . Unspecified hereditary and idiopathic peripheral neuropathy    Past Surgical History:  Procedure Laterality Date  . CARDIAC CATHETERIZATION  01/31/2004   Dr Gwenlyn Found  . CARDIOVERSION N/A 07/12/2016   Procedure: CARDIOVERSION;  Surgeon: Pixie Casino, MD;  Location: [facility];  Service: Cardiovascular;  Laterality: N/A;  . COLONOSCOPY  08/09/2006   Dr Christian Mate, hemorrhoids/rectal fistula  . cystocopy  07/2006   Dr Harlow Asa  . NASAL SINUS SURGERY  1992  . PROSTATE SURGERY  1991   Family History  Problem Relation Age of Onset  . Prostate cancer Father   . Heart failure Mother   . Lung cancer Daughter   . Arthritis Sister    Social History   Occupational History  . Not on file.   Social History Main Topics  . Smoking status: Never Smoker  . Smokeless tobacco: Current User    Types: Chew     Comment: Chews tobacco daily   . Alcohol use No  . Drug use: No  . Sexual activity: Not Currently   Tobacco Counseling Ready to quit: Not Answered Counseling given: Not Answered   Activities of Daily Living In your present state of health, do you have any difficulty performing the following activities: 11/15/2016  Hearing? Y  Vision? N  Difficulty concentrating or making decisions? Y  Walking or climbing stairs? Y  Dressing or bathing? N  Doing errands,  shopping? N  Preparing Food and eating ? Y  Using the Toilet? N  In the past six months, have you accidently leaked urine? N  Do you have problems with loss of bowel control? N  Managing your Medications? Y  Managing your Finances? Y  Housekeeping or managing your Housekeeping? Y  Some recent data might be hidden    Immunizations and Health Maintenance Immunization History  Administered Date(s) Administered  . Influenza,inj,Quad PF,36+ Mos 06/21/2015  . Pneumococcal Conjugate-13 06/21/2015   Health Maintenance Due  Topic Date Due  . FOOT EXAM  12/22/2015  . PNA vac Low Risk  Adult (2 of 2 - PPSV23) 06/20/2016  . HEMOGLOBIN A1C  08/08/2016  . OPHTHALMOLOGY EXAM  08/15/2016    Patient Care Team: Estill Dooms, MD as PCP - General (Internal Medicine) Tomasita Morrow, NP as Nurse Practitioner (Cardiology) Deneise Lever, MD as Consulting Physician (Pulmonary Disease) Kristeen Miss, MD as Consulting Physician (Neurosurgery) Ronny Bacon, MD as Referring Physician (Surgery) Milana Na, MD (General Surgery) Bo Merino, MD as Consulting Physician (Rheumatology)  Indicate any recent Medical Services you may have received from other than [facility] providers in the past year (date may be approximate).    Assessment:   This is a routine wellness examination for Ronnie Jackson.   Hearing/Vision screen No exam data present  Dietary issues and exercise activities discussed: Current Exercise Habits: The patient does not participate in regular exercise at present, Exercise limited by: None identified  Goals    . move around more          Starting 11/15/2016 I would like to move around more       Depression Screen PHQ 2/9 Scores 11/15/2016 02/08/2016 12/22/2014 11/02/2014  PHQ - 2 Score 0 0 0 0  PHQ- 9 Score - - - -    Fall Risk Fall Risk  11/15/2016 08/21/2016 03/06/2016 02/08/2016 10/05/2015  Falls in the past year? Yes No No No Yes  Number falls in past yr: 2 or more - - - 2 or more  Injury with Fall? No - - - No    Cognitive Function: MMSE - Mini Mental State Exam 11/15/2016  Orientation to time 4  Orientation to Place 4  Registration 3  Attention/ Calculation 5  Recall 2  Language- name 2 objects 2  Language- repeat 1  Language- follow 3 step command 3  Language- read & follow direction 1  Write a sentence 1  Copy design 1  Total score 27        Screening Tests Health Maintenance  Topic Date Due  . FOOT EXAM  12/22/2015  . PNA vac Low Risk Adult (2 of 2 - PPSV23) 06/20/2016  . HEMOGLOBIN A1C  08/08/2016  . OPHTHALMOLOGY EXAM   08/15/2016  . TETANUS/TDAP  11/01/2024 (Originally 08/02/1954)  . URINE MICROALBUMIN  02/05/2017  . INFLUENZA VACCINE  02/20/2017        Plan:    I have personally reviewed and addressed the Medicare Annual Wellness questionnaire and have noted the following in the patient's chart:  A. Medical and social history B. Use of alcohol, tobacco or illicit drugs  C. Current medications and supplements D. Functional ability and status E.  Nutritional status F.  Physical activity G. Advance directives H. List of other physicians I.  Hospitalizations, surgeries, and ER visits in previous 12 months J.  [place] to include hearing, vision, cognitive, depression L. Referrals and appointments - none  In  addition, I have reviewed and discussed with patient certain preventive protocols, quality metrics, and best practice recommendations. A written personalized care plan for preventive services as well as general preventive health recommendations were provided to patient.  See attached scanned questionnaire for additional information.   Signed,   Rich Reining, RN Nurse Health Advisor

## 2016-11-16 LAB — MICROALBUMIN, URINE: Microalb, Ur: 3 mg/dL

## 2016-11-16 LAB — HEMOGLOBIN A1C
Hgb A1c MFr Bld: 6.3 % — ABNORMAL HIGH (ref <5.7)
Mean Plasma Glucose: 134 mg/dL

## 2016-11-21 ENCOUNTER — Ambulatory Visit (INDEPENDENT_AMBULATORY_CARE_PROVIDER_SITE_OTHER): Payer: PPO | Admitting: Internal Medicine

## 2016-11-21 ENCOUNTER — Encounter: Payer: Self-pay | Admitting: Internal Medicine

## 2016-11-21 VITALS — BP 138/86 | HR 74 | Temp 97.6°F | Ht 70.0 in | Wt 225.0 lb

## 2016-11-21 DIAGNOSIS — M25562 Pain in left knee: Secondary | ICD-10-CM | POA: Diagnosis not present

## 2016-11-21 DIAGNOSIS — G894 Chronic pain syndrome: Secondary | ICD-10-CM

## 2016-11-21 DIAGNOSIS — M05769 Rheumatoid arthritis with rheumatoid factor of unspecified knee without organ or systems involvement: Secondary | ICD-10-CM | POA: Diagnosis not present

## 2016-11-21 DIAGNOSIS — E785 Hyperlipidemia, unspecified: Secondary | ICD-10-CM

## 2016-11-21 DIAGNOSIS — E1142 Type 2 diabetes mellitus with diabetic polyneuropathy: Secondary | ICD-10-CM

## 2016-11-21 DIAGNOSIS — M25561 Pain in right knee: Secondary | ICD-10-CM

## 2016-11-21 DIAGNOSIS — I481 Persistent atrial fibrillation: Secondary | ICD-10-CM | POA: Diagnosis not present

## 2016-11-21 DIAGNOSIS — I4819 Other persistent atrial fibrillation: Secondary | ICD-10-CM

## 2016-11-21 MED ORDER — MORPHINE SULFATE 30 MG PO TABS: ORAL_TABLET | ORAL | 0 refills | Status: DC

## 2016-11-21 MED FILL — MORPHINE SULFATE IR 30 MG T: 30 | 30 days supply | Qty: 120 | Fill #0

## 2016-11-21 NOTE — Progress Notes (Signed)
Facility  [facility]    Place of Service:   OFFICE    Allergies  Allergen Reactions  . Indocin [Indomethacin]   . Methadone     Chief Complaint  Patient presents with  . Medical Management of Chronic Issues    3 month medication management A-Fib, edema, kypoklemia, blood sugar, blood pressure, review lab. Here with wife  . 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 In 06/29/2014 he had a weak positive RA factor at 15.9. ESR at that time only 7. He says his pain is getting worse. He is willing to see rheumatologist. Saw Dr. Charlestine Night in the past.   Medications: Patient's Medications  New Prescriptions   No medications on file  Previous Medications   APIXABAN (ELIQUIS) 5 MG TABS TABLET    Take 1 tablet (5 mg total) by mouth 2 (two) times daily. For Afib   CROMOLYN (OPTICROM) 4 % OPHTHALMIC SOLUTION    Place 1 drop into both eyes 4 (four) times daily.   FUROSEMIDE (LASIX) 40 MG TABLET    One twice daily to prevent fluid retention.   SULFAMETHOXAZOLE-TRIMETHOPRIM (BACTRIM DS,SEPTRA DS) 800-160 MG TABLET    One twice daily to control infection  Modified Medications   Modified Medication Previous Medication   MORPHINE (MSIR) 30 MG TABLET morphine (MSIR) 30 MG tablet      Take two tablets by mouth every 12 hours as needed for pain    Take two tablets by mouth every 12 hours as needed for pain  Discontinued Medications   No medications on file    Review of Systems  Constitutional:  Positive for fatigue. Negative for activity change, appetite change, chills and fever.  HENT: Negative for congestion, ear pain, postnasal drip, sinus pressure, sore throat and trouble swallowing.   Eyes: Negative for visual disturbance.  Respiratory: Positive for cough. Negative for chest tightness and shortness of breath.   Cardiovascular: Positive for leg swelling. Negative for chest pain and palpitations.       New diagnosis of AF on 03/06/16, but EKG from 2016 suggests he had the problem then. Cardioverted 07/13/16. Taken off flecainide 08/21/16.  Gastrointestinal: Negative for abdominal pain, blood in stool, nausea and vomiting.  Genitourinary: Negative for difficulty urinating, frequency and urgency.       History of rectovesical fistula. Recurrent urinary tract infections.  Musculoskeletal: Positive for arthralgias, back pain, myalgias, neck pain and neck stiffness. Negative for gait problem.  Skin: Negative for rash.  Neurological: Positive for numbness (In both feet). Negative for dizziness, weakness and headaches.       Paresthesias in hands and lower forearms likely related to known cervical spine disease.  Psychiatric/Behavioral: Positive for sleep disturbance. Negative for confusion. The patient is not nervous/anxious.     Vitals:   11/21/16 1545  BP: 138/86  Pulse: 74  Temp: 97.6 F (36.4 C)  TempSrc: Oral  SpO2: 96%  Weight: 225 lb (102.1 kg)  Height: '5\' 10"'$  (1.778 m)   Body mass index is 32.28  kg/m. Wt Readings from Last 3 Encounters:  11/21/16 225 lb (102.1 kg)  11/15/16 224 lb (101.6 kg)  09/21/16 223 lb 9.6 oz (101.4 kg)      Physical Exam  Constitutional: He is oriented to person, place, and time. He appears well-developed and well-nourished. No distress.  Overweight  HENT:  Right Ear: External ear normal.  Left Ear: External ear normal.  Nose: Nose normal.  Mouth/Throat: Oropharynx is clear and moist. No oropharyngeal exudate.  Significant partial  hearing loss  Eyes: Conjunctivae and EOM are normal. Pupils are equal, round, and reactive to light.  Neck: No JVD present. No tracheal deviation present. No thyromegaly present.  Cardiovascular: Normal rate and intact distal pulses.  Exam reveals no gallop and no friction rub.   No murmur heard. New finding on 03/06/16: Atrial fib. Rate controlled at 67 BPM.  Pulmonary/Chest: No respiratory distress. He has no wheezes. He has rales (right lower lobe posteriorly). He exhibits no tenderness.  Abdominal: He exhibits no distension and no mass. There is no tenderness.  Musculoskeletal: He exhibits edema (Increased lower leg edema with the left leg greater than the right) and tenderness.  Multiple painful areas including the neck, shoulders, back, hips, and knees. Diminished range of motion at the shoulders. Increased pain with flexion, extension, rotation of the back Increased pain with abduction or flexion of the right hip. Pain with palpation of the groin area. No bulge. Pain with extension of either knee associated with crepitus.  Lymphadenopathy:    He has no cervical adenopathy.  Neurological: He is alert and oriented to person, place, and time. He has normal reflexes. No cranial nerve deficit. Coordination normal.  Decreased sensation to Vibration and monofilament  Skin: No rash noted. No erythema. No pallor.  Psychiatric: He has a normal mood and affect. His behavior is normal. Judgment and thought content normal.    Labs reviewed: Lab Summary Latest Ref Rng & Units 11/15/2016 08/17/2016 07/06/2016 03/15/2016  Hemoglobin 13.2 - 17.1 g/dL (None) (None) 13.1(L) (None)  Hematocrit 38.5 - 50.0 % (None) (None) 38.7 (None)  White count 3.8 - 10.8 K/uL (None) (None) 6.3 (None)  Platelet count 140 - 400 K/uL (None) (None) 164 (None)  Sodium 135 - 146 mmol/L 144 142 141 135  Potassium 3.5 - 5.3 mmol/L 3.6 3.7 4.3 3.2(L)  Calcium 8.6 - 10.3 mg/dL 8.7 8.6 8.8 8.9  Phosphorus - (None) (None) (None)  (None)  Creatinine 0.70 - 1.18 mg/dL 1.23(H) 1.30(H) 1.53(H) 1.91(H)  AST 10 - 35 U/L 20 (None) (None) (None)  Alk Phos 40 - 115 U/L 72 (None) (None) (None)  Bilirubin 0.2 - 1.2 mg/dL 0.9 (None) (None) (None)  Glucose 65 - 99 mg/dL 122(H) 119(H) 120(H) 112(H)  Cholesterol - (None) (None) (None) (None)  HDL cholesterol - (None) (None) (None) (None)  Triglycerides - (None) (None) (None) (None)  LDL Direct - (None) (None) (None) (None)  LDL Calc - (None) (None) (None) (None)  Total protein 6.1 - 8.1 g/dL 6.7 (None) (None) (None)  Albumin 3.6 - 5.1 g/dL 4.2 (None) (None) (None)  Some recent data might be hidden   No results found for: TSH, T3TOTAL, T4TOTAL, THYROIDAB Lab Results  Component Value Date   BUN 12 11/15/2016   BUN 14 08/17/2016   BUN 10 07/06/2016   Lab Results  Component Value Date   HGBA1C 6.3 (H) 11/15/2016   HGBA1C 6.6 (H) 02/06/2016   HGBA1C 6.5 (H) 03/25/2015    Assessment/Plan  1. Arthralgia of both  knees - morphine (MSIR) 30 MG tablet; Take two tablets by mouth every 12 hours as needed for pain  Dispense: 120 tablet; Refill: 0  2. Chronic pain syndrome - morphine (MSIR) 30 MG tablet; Take two tablets by mouth every 12 hours as needed for pain  Dispense: 120 tablet; Refill: 0 - Ambulatory referral to Rheumatology  3. Persistent atrial fibrillation (HCC) Continue Eliquis  4. DM type 2 with diabetic peripheral neuropathy (HCC) - Hemoglobin A1c; Future - Basic metabolic panel; Future  5. 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 Hyperlipidemia, unspecified hyperlipidemia type - Lipid panel; Future

## 2016-11-29 ENCOUNTER — Telehealth: Payer: Self-pay

## 2016-11-29 MED ORDER — FIRST-DUKES MOUTHWASH MT SUSP: 5.0000 mL | Freq: Four times a day (QID) | OROMUCOSAL | 0 refills | Status: DC

## 2016-11-29 MED FILL — MAGIC MOUTHWASH BOP FORM: 10 days supply | Qty: 200 | Fill #0

## 2016-11-29 NOTE — Telephone Encounter (Signed)
Spoke with patients wife and informed her that medication has been sent to pharmacy she expressed understanding.

## 2016-11-29 NOTE — Telephone Encounter (Signed)
Called patient to inform patient no answer unable to leave voicemail will try again at a later time.

## 2016-11-29 NOTE — Telephone Encounter (Signed)
Call in prescription for [employer] Magic Mouthwash (200 cc). Swish 5cc in mouth for 1 minute 4 times daily.

## 2016-11-29 NOTE — Telephone Encounter (Signed)
Patients wife is request refill on magic mouthwash, states that patient has little clear bumps and he has had it before. Started about 2-3 days ago. Please advise

## 2016-12-11 MED FILL — ELIQUIS 5 MG TABLET: 5 | 30 days supply | Qty: 60 | Fill #4

## 2016-12-21 ENCOUNTER — Other Ambulatory Visit: Payer: Self-pay | Admitting: *Deleted

## 2016-12-21 DIAGNOSIS — M25561 Pain in right knee: Secondary | ICD-10-CM

## 2016-12-21 DIAGNOSIS — M25562 Pain in left knee: Principal | ICD-10-CM

## 2016-12-21 DIAGNOSIS — G894 Chronic pain syndrome: Secondary | ICD-10-CM

## 2016-12-21 MED ORDER — MORPHINE SULFATE 30 MG PO TABS: ORAL_TABLET | ORAL | 0 refills | Status: DC

## 2016-12-21 NOTE — Telephone Encounter (Signed)
Ronnie Jackson, wife requested and will pick up

## 2016-12-24 MED FILL — MORPHINE SULFATE IR 30 MG T: 30 | 30 days supply | Qty: 120 | Fill #0

## 2016-12-25 ENCOUNTER — Encounter: Payer: Self-pay | Admitting: Internal Medicine

## 2017-01-10 DIAGNOSIS — Z6834 Body mass index (BMI) 34.0-34.9, adult: Secondary | ICD-10-CM | POA: Diagnosis not present

## 2017-01-10 DIAGNOSIS — E669 Obesity, unspecified: Secondary | ICD-10-CM | POA: Diagnosis not present

## 2017-01-10 DIAGNOSIS — M7989 Other specified soft tissue disorders: Secondary | ICD-10-CM | POA: Diagnosis not present

## 2017-01-10 DIAGNOSIS — M2550 Pain in unspecified joint: Secondary | ICD-10-CM | POA: Diagnosis not present

## 2017-01-10 DIAGNOSIS — R5383 Other fatigue: Secondary | ICD-10-CM | POA: Diagnosis not present

## 2017-01-10 DIAGNOSIS — R768 Other specified abnormal immunological findings in serum: Secondary | ICD-10-CM | POA: Diagnosis not present

## 2017-01-10 DIAGNOSIS — M150 Primary generalized (osteo)arthritis: Secondary | ICD-10-CM | POA: Diagnosis not present

## 2017-01-10 LAB — CBC AND DIFFERENTIAL
HCT: 42 (ref 41–53)
Hemoglobin: 13.8 (ref 13.5–17.5)
Neutrophils Absolute: 6
Platelets: 189 (ref 150–399)
WBC: 10.4

## 2017-01-10 MED FILL — FUROSEMIDE 40 MG TABLET: 40 | 30 days supply | Qty: 60 | Fill #5

## 2017-01-10 MED FILL — SULFAMETHOXAZOLE/TMP DS TAB: 800-160 | 30 days supply | Qty: 60 | Fill #2

## 2017-01-10 MED FILL — ELIQUIS 5 MG TABLET: 5 | 30 days supply | Qty: 60 | Fill #5

## 2017-01-14 ENCOUNTER — Encounter: Payer: Self-pay | Admitting: *Deleted

## 2017-01-15 DIAGNOSIS — H353131 Nonexudative age-related macular degeneration, bilateral, early dry stage: Secondary | ICD-10-CM | POA: Diagnosis not present

## 2017-01-15 DIAGNOSIS — Z961 Presence of intraocular lens: Secondary | ICD-10-CM | POA: Diagnosis not present

## 2017-01-15 DIAGNOSIS — H04123 Dry eye syndrome of bilateral lacrimal glands: Secondary | ICD-10-CM | POA: Diagnosis not present

## 2017-01-15 DIAGNOSIS — E119 Type 2 diabetes mellitus without complications: Secondary | ICD-10-CM | POA: Diagnosis not present

## 2017-01-15 DIAGNOSIS — H10413 Chronic giant papillary conjunctivitis, bilateral: Secondary | ICD-10-CM | POA: Diagnosis not present

## 2017-01-15 LAB — HM DIABETES EYE EXAM

## 2017-01-17 DIAGNOSIS — E669 Obesity, unspecified: Secondary | ICD-10-CM | POA: Diagnosis not present

## 2017-01-17 DIAGNOSIS — M150 Primary generalized (osteo)arthritis: Secondary | ICD-10-CM | POA: Diagnosis not present

## 2017-01-17 DIAGNOSIS — Z6833 Body mass index (BMI) 33.0-33.9, adult: Secondary | ICD-10-CM | POA: Diagnosis not present

## 2017-01-17 DIAGNOSIS — R768 Other specified abnormal immunological findings in serum: Secondary | ICD-10-CM | POA: Diagnosis not present

## 2017-01-17 DIAGNOSIS — M2550 Pain in unspecified joint: Secondary | ICD-10-CM | POA: Diagnosis not present

## 2017-01-18 ENCOUNTER — Other Ambulatory Visit: Payer: Self-pay | Admitting: *Deleted

## 2017-01-18 DIAGNOSIS — G894 Chronic pain syndrome: Secondary | ICD-10-CM

## 2017-01-18 DIAGNOSIS — M25561 Pain in right knee: Secondary | ICD-10-CM

## 2017-01-18 DIAGNOSIS — M25562 Pain in left knee: Principal | ICD-10-CM

## 2017-01-18 MED ORDER — MORPHINE SULFATE 30 MG PO TABS: ORAL_TABLET | ORAL | 0 refills | Status: DC

## 2017-01-18 NOTE — Addendum Note (Signed)
Addended by: Royann Shivers A on: 01/18/2017 01:24 PM   Modules accepted: Orders

## 2017-01-18 NOTE — Telephone Encounter (Signed)
Patient wife requested and will pick up

## 2017-01-21 MED FILL — MORPHINE SULFATE IR 30 MG T: 30 | 30 days supply | Qty: 120 | Fill #0

## 2017-02-08 ENCOUNTER — Other Ambulatory Visit: Payer: Self-pay | Admitting: Internal Medicine

## 2017-02-08 MED FILL — FUROSEMIDE 40 MG TAB: 40 | 30 days supply | Qty: 60 | Fill #0

## 2017-02-08 MED FILL — ELIQUIS 5 MG TABLET: 5 | 30 days supply | Qty: 60 | Fill #0

## 2017-02-08 NOTE — Telephone Encounter (Signed)
[facility] 

## 2017-02-19 ENCOUNTER — Other Ambulatory Visit: Payer: Self-pay | Admitting: *Deleted

## 2017-02-19 DIAGNOSIS — G894 Chronic pain syndrome: Secondary | ICD-10-CM

## 2017-02-19 DIAGNOSIS — M25561 Pain in right knee: Secondary | ICD-10-CM

## 2017-02-19 DIAGNOSIS — M25562 Pain in left knee: Principal | ICD-10-CM

## 2017-02-19 MED ORDER — MORPHINE SULFATE 30 MG PO TABS: ORAL_TABLET | ORAL | 0 refills | Status: DC

## 2017-02-19 NOTE — Telephone Encounter (Signed)
Jean,wife requested and will pick up

## 2017-02-21 MED FILL — MORPHINE SULFATE IR 30 MG T: 30 | 30 days supply | Qty: 120 | Fill #0

## 2017-03-12 MED FILL — FUROSEMIDE 40 MG TAB: 40 | 30 days supply | Qty: 60 | Fill #1

## 2017-03-12 MED FILL — ELIQUIS 5 MG TABLET: 5 | 30 days supply | Qty: 60 | Fill #1

## 2017-03-20 ENCOUNTER — Other Ambulatory Visit: Payer: Self-pay | Admitting: Internal Medicine

## 2017-03-20 MED FILL — SULFAMETHOXAZOLE-TMP DS TAB: 800-160 | 30 days supply | Qty: 60 | Fill #0

## 2017-03-22 ENCOUNTER — Other Ambulatory Visit: Payer: Self-pay | Admitting: *Deleted

## 2017-03-22 DIAGNOSIS — M25561 Pain in right knee: Secondary | ICD-10-CM

## 2017-03-22 DIAGNOSIS — M25562 Pain in left knee: Principal | ICD-10-CM

## 2017-03-22 DIAGNOSIS — G894 Chronic pain syndrome: Secondary | ICD-10-CM

## 2017-03-22 MED ORDER — MORPHINE SULFATE 30 MG PO TABS: ORAL_TABLET | ORAL | 0 refills | Status: DC

## 2017-03-22 MED FILL — MORPHINE SULFATE IR 30 MG T: 30 | 30 days supply | Qty: 120 | Fill #0

## 2017-03-22 NOTE — Telephone Encounter (Signed)
Patient requested and will pick up [place] Database Checked.  

## 2017-04-02 ENCOUNTER — Other Ambulatory Visit: Payer: PPO

## 2017-04-05 ENCOUNTER — Ambulatory Visit: Payer: PPO | Admitting: Internal Medicine

## 2017-04-08 ENCOUNTER — Other Ambulatory Visit: Payer: Self-pay | Admitting: Internal Medicine

## 2017-04-08 DIAGNOSIS — G894 Chronic pain syndrome: Secondary | ICD-10-CM

## 2017-04-08 DIAGNOSIS — E785 Hyperlipidemia, unspecified: Secondary | ICD-10-CM

## 2017-04-08 DIAGNOSIS — I4819 Other persistent atrial fibrillation: Secondary | ICD-10-CM

## 2017-04-08 DIAGNOSIS — E1142 Type 2 diabetes mellitus with diabetic polyneuropathy: Secondary | ICD-10-CM

## 2017-04-10 ENCOUNTER — Other Ambulatory Visit: Payer: Self-pay | Admitting: Cardiology

## 2017-04-10 DIAGNOSIS — R609 Edema, unspecified: Secondary | ICD-10-CM

## 2017-04-10 MED FILL — FUROSEMIDE 40 MG TAB: 40 | 30 days supply | Qty: 60 | Fill #0

## 2017-04-15 ENCOUNTER — Other Ambulatory Visit: Payer: PPO

## 2017-04-15 DIAGNOSIS — E1142 Type 2 diabetes mellitus with diabetic polyneuropathy: Secondary | ICD-10-CM

## 2017-04-15 DIAGNOSIS — G894 Chronic pain syndrome: Secondary | ICD-10-CM

## 2017-04-15 DIAGNOSIS — I481 Persistent atrial fibrillation: Secondary | ICD-10-CM | POA: Diagnosis not present

## 2017-04-15 DIAGNOSIS — E785 Hyperlipidemia, unspecified: Secondary | ICD-10-CM | POA: Diagnosis not present

## 2017-04-15 DIAGNOSIS — I4819 Other persistent atrial fibrillation: Secondary | ICD-10-CM

## 2017-04-16 LAB — HEMOGLOBIN A1C
Hgb A1c MFr Bld: 6.5 % of total Hgb — ABNORMAL HIGH (ref <5.7)
Mean Plasma Glucose: 140 (calc)
eAG (mmol/L): 7.7 (calc)

## 2017-04-16 LAB — BASIC METABOLIC PANEL WITH GFR
BUN/Creatinine Ratio: 15 (calc) (ref 6–22)
BUN: 20 mg/dL (ref 7–25)
CO2: 30 mmol/L (ref 20–32)
Calcium: 8.4 mg/dL — ABNORMAL LOW (ref 8.6–10.3)
Chloride: 103 mmol/L (ref 98–110)
Creat: 1.34 mg/dL — ABNORMAL HIGH (ref 0.70–1.18)
GFR, Est African American: 58 mL/min/{1.73_m2} — ABNORMAL LOW (ref >=60)
GFR, Est Non African American: 50 mL/min/{1.73_m2} — ABNORMAL LOW (ref >=60)
Glucose, Bld: 120 mg/dL — ABNORMAL HIGH (ref 65–99)
Potassium: 3.8 mmol/L (ref 3.5–5.3)
Sodium: 140 mmol/L (ref 135–146)

## 2017-04-16 LAB — LIPID PANEL
Cholesterol: 147 mg/dL (ref <200)
HDL: 31 mg/dL — ABNORMAL LOW (ref >40)
LDL Cholesterol (Calc): 92 mg/dL (calc)
Non-HDL Cholesterol (Calc): 116 mg/dL (calc) (ref <130)
Total CHOL/HDL Ratio: 4.7 (calc) (ref <5.0)
Triglycerides: 138 mg/dL (ref <150)

## 2017-04-17 MED FILL — CROMOLYN 4% EYE DROPS: 4 | 25 days supply | Qty: 10 | Fill #0

## 2017-04-18 ENCOUNTER — Ambulatory Visit (INDEPENDENT_AMBULATORY_CARE_PROVIDER_SITE_OTHER): Payer: PPO | Admitting: Internal Medicine

## 2017-04-18 ENCOUNTER — Telehealth: Payer: Self-pay | Admitting: *Deleted

## 2017-04-18 ENCOUNTER — Encounter: Payer: Self-pay | Admitting: Internal Medicine

## 2017-04-18 VITALS — BP 130/70 | HR 93 | Temp 98.0°F | Wt 223.0 lb

## 2017-04-18 DIAGNOSIS — C61 Malignant neoplasm of prostate: Secondary | ICD-10-CM | POA: Diagnosis not present

## 2017-04-18 DIAGNOSIS — Z23 Encounter for immunization: Secondary | ICD-10-CM

## 2017-04-18 DIAGNOSIS — E1142 Type 2 diabetes mellitus with diabetic polyneuropathy: Secondary | ICD-10-CM

## 2017-04-18 DIAGNOSIS — I481 Persistent atrial fibrillation: Secondary | ICD-10-CM

## 2017-04-18 DIAGNOSIS — K631 Perforation of intestine (nontraumatic): Secondary | ICD-10-CM

## 2017-04-18 DIAGNOSIS — I4819 Other persistent atrial fibrillation: Secondary | ICD-10-CM

## 2017-04-18 DIAGNOSIS — M7989 Other specified soft tissue disorders: Secondary | ICD-10-CM

## 2017-04-18 DIAGNOSIS — I872 Venous insufficiency (chronic) (peripheral): Secondary | ICD-10-CM

## 2017-04-18 DIAGNOSIS — M5030 Other cervical disc degeneration, unspecified cervical region: Secondary | ICD-10-CM | POA: Diagnosis not present

## 2017-04-18 NOTE — Progress Notes (Signed)
Location:  [facility] clinic Provider:  Dianna Ewald L. Mariea Clonts, D.O., C.M.D.  Code Status: full code Goals of Care:  Advanced Directives 11/21/2016  Does Patient Have a Medical Advance Directive? No  Would patient like information on creating a medical advance directive? -   Chief Complaint  Patient presents with  . Medical Management of Chronic Issues    68mth follow-up, transfer from Dr. Nyoka Cowden    HPI: Patient is a 81 y.o. male seen today for medical management of chronic diseases.    We reviewed the reason for his bactrim prophylaxis--had hole in his colon after his prostate cancer surgery  Feet are swelling tremendously.  Left worse than right.  They hurt.  Going on over a year.  Does take fluid pills and it goes down, but comes back.    Afib:  Rate controlled as far as we know.  He is unable to feel his afib.  On eliquis.  He does not follow any kind of diet--eats cookies, drinks some sodas, does try not to eat a lot of salty snacks, but does use some salt.  Discussed that cheeze-its are salty.    Left leg has tried to give out on him twice and hurts in his thigh for the past week.    Does not sleep in bed, uses recliner chair.  Shoulders and neck hurt in the bed.    Neck pain--did stretches for that.  Had surgery.  C6-7 was advanced DDD in 2014.    Sugar average has trended up a little bit.  Hba1c 6.5.  Not on any meds for it.  Eating too many cookies, especially chocolate.  Discussed serving size.    Reviewed echocardiogram and stress test from end of 2017 and stress test Jan '18.    He had a cath several years ago w/o any blockages, but says there was a blockage in his one leg.    Past Medical History:  Diagnosis Date  . Anxiety state, unspecified   . Atrial fibrillation ([place]) 03/06/2016  . Chronic pain syndrome   . Constipation   . Depressive disorder, not elsewhere classified   . Dysphagia, unspecified(787.20)   . Herpes simplex disease   . Hyperlipidemia   . Hypertension     . Hypopotassemia   . Insomnia, unspecified   . Intestinovesical fistula   . Malignant neoplasm of prostate ([place])   . Myalgia and myositis, unspecified   . Osteoarthritis   . Pain in joint, lower leg    Bilateral knee pains  . Reflux esophagitis   . Rheumatoid arthritis with rheumatoid factor (HCC)   . Spinal stenosis, unspecified region other than cervical   . Thrombocytopenia, unspecified ([place])   . Type II or unspecified type diabetes mellitus without mention of complication, not stated as uncontrolled   . Unspecified arthropathy, pelvic region and thigh   . Unspecified hereditary and idiopathic peripheral neuropathy     Past Surgical History:  Procedure Laterality Date  . CARDIAC CATHETERIZATION  01/31/2004   Dr Gwenlyn Found  . CARDIOVERSION N/A 07/12/2016   Procedure: CARDIOVERSION;  Surgeon: Pixie Casino, MD;  Location: [place];  Service: Cardiovascular;  Laterality: N/A;  . COLONOSCOPY  08/09/2006   Dr Christian Mate, hemorrhoids/rectal fistula  . cystocopy  07/2006   Dr Harlow Asa  . NASAL SINUS SURGERY  1992  . PROSTATE SURGERY  1991    Allergies  Allergen Reactions  . Indocin [Indomethacin]   . Methadone     Outpatient Encounter Prescriptions as of  04/18/2017  Medication Sig  . cromolyn (OPTICROM) 4 % ophthalmic solution Place 1 drop into both eyes 4 (four) times daily.  . Diphenhyd-Hydrocort-Nystatin (FIRST-DUKES MOUTHWASH) SUSP Use as directed 5 mLs in the mouth or throat 4 (four) times daily.  Marland Kitchen ELIQUIS 5 MG TABS tablet TAKE 1 TABLET BY MOUTH TWICE DAILY TO HELP PREVENT STROKE RELATED TO ATRIAL FIBRILLATION  . furosemide (LASIX) 40 MG tablet TAKE 1 TABLET BY MOUTH TWICE DAILY TO PREVENT FLUID RETENTION  . morphine (MSIR) 30 MG tablet Take two tablets by mouth every 12 hours as needed for pain  . sulfamethoxazole-trimethoprim (BACTRIM DS,SEPTRA DS) 800-160 MG tablet TAKE 1 TABLET BY MOUTH TWICE DAILY TO CONTROL INFECTION   No facility-administered encounter  medications on file as of 04/18/2017.     Review of Systems:  Review of Systems  Constitutional: Negative for chills, fever and malaise/fatigue.  HENT: Positive for hearing loss.        Came w/o hearing aid  Eyes: Negative for blurred vision.  Respiratory: Positive for shortness of breath. Negative for cough.   Cardiovascular: Positive for leg swelling. Negative for chest pain and palpitations.  Gastrointestinal: Negative for abdominal pain.  Genitourinary: Negative for dysuria.  Musculoskeletal: Positive for back pain, falls, joint pain and myalgias.  Neurological: Positive for tingling and sensory change. Negative for dizziness, loss of consciousness and weakness.  Endo/Heme/Allergies: Does not bruise/bleed easily.  Psychiatric/Behavioral: Negative for depression and memory loss.    Health Maintenance  Topic Date Due  . FOOT EXAM  12/22/2015  . INFLUENZA VACCINE  02/20/2017  . TETANUS/TDAP  11/01/2024 (Originally 08/02/1954)  . HEMOGLOBIN A1C  10/13/2017  . URINE MICROALBUMIN  11/15/2017  . OPHTHALMOLOGY EXAM  01/15/2018  . PNA vac Low Risk Adult  Completed    Physical Exam: Vitals:   04/18/17 1104  BP: 130/70  Pulse: 93  Temp: 98 F (36.7 C)  TempSrc: Oral  SpO2: 97%  Weight: 223 lb (101.2 kg)   Body mass index is 32 kg/m. Physical Exam  Constitutional: He is oriented to person, place, and time. He appears well-developed and well-nourished. No distress.  HENT:  Head: Normocephalic and atraumatic.  HOH  Eyes: Pupils are equal, round, and reactive to light.  Cardiovascular:  irreg irreg; left leg 2+ edematous vs. Right 1+, but both with some swelling  Pulmonary/Chest: Effort normal and breath sounds normal. No respiratory distress.  Abdominal: Soft. Bowel sounds are normal. He exhibits no distension. There is no tenderness.  Musculoskeletal: Normal range of motion. He exhibits tenderness.  Right SI joint, left thigh and knee  Neurological: He is alert and  oriented to person, place, and time.  Skin: Skin is warm and dry.    Labs reviewed: Basic Metabolic Panel:  Recent Labs  08/17/16 0917 11/15/16 0957 04/15/17 0908  NA 142 144 140  K 3.7 3.6 3.8  CL 103 104 103  CO2 24 22 30   GLUCOSE 119* 122* 120*  BUN 14 12 20   CREATININE 1.30* 1.23* 1.34*  CALCIUM 8.6 8.7 8.4*   Liver Function Tests:  Recent Labs  11/15/16 0957  AST 20  ALT 12  ALKPHOS 72  BILITOT 0.9  PROT 6.7  ALBUMIN 4.2   No results for input(s): LIPASE, AMYLASE in the last 8760 hours. No results for input(s): AMMONIA in the last 8760 hours. CBC:  Recent Labs  07/06/16 1253 01/10/17  WBC 6.3 10.4  NEUTROABS 2,898 6  HGB 13.1* 13.8  HCT 38.7 42  MCV  92.6  --   PLT 164 189   Lipid Panel:  Recent Labs  04/15/17 0908  CHOL 147  HDL 31*  TRIG 138  CHOLHDL 4.7   Lab Results  Component Value Date   HGBA1C 6.5 (H) 04/15/2017    Assessment/Plan 1. Persistent atrial fibrillation (HCC) -rate controlled, not on meds for that, but on eliquis for anticoagulation, cont to monitor h/h and renal function  2. DM type 2 with diabetic peripheral neuropathy (HCC) -not on meds, counseled on dietary changes to help with this, apparently he's been eating whole containers of cookies instead of a serving size  3. Malignant neoplasm of prostate [facility]) -s/p resection of some sort in 1991  4. Hole in colon [facility]) -says this happened when he had his prostate surgery and Dr. Nyoka Cowden has put him on permanent antibiotic prophylaxis b/c he was getting recurrent abdominal infections for this--unclear what the actual situation was (fistula?)  5. DDD (degenerative disc disease), cervical -with prior neck surgery per pt but it's not in his surgical history, has some chronic neck and shoulder pain due to this  6. Chronic venous insufficiency - notable on exam, counseled on low sodium diet, elevating feet at rest - VAS [place] ABI WITH/WO TBI; Future to evaluate arterial  circulation b/c he was told in the past that he had a blockage in his leg (???) and I can't find clear documentation about this and now left leg is more swollen and painful with increased activity, remains warm and pink -if arterial study unremarkable, will recommend compression hose  7. Left leg swelling - see #6 - VAS [place] ABI WITH/WO TBI; Future  8. Need for immunization against influenza - Flu vaccine HIGH DOSE PF (Fluzone High dose) given  Labs/tests ordered:   Orders Placed This Encounter  Procedures  . Flu vaccine HIGH DOSE PF (Fluzone High dose)  . CBC with Differential/Platelet    Standing Status:   Future    Standing Expiration Date:   12/16/2017  . COMPLETE METABOLIC PANEL WITH GFR    Standing Status:   Future    Standing Expiration Date:   12/16/2017  . Hemoglobin A1c    Standing Status:   Future    Standing Expiration Date:   12/16/2017  . Lipid panel    Standing Status:   Future    Standing Expiration Date:   12/16/2017    Next appt:  4 mos with labs before  Ron Junco L. Jyssica Rief, D.O. [facility] [address], [place] [address] Cell Phone (Mon-Fri 8am-5pm):  [phone] On Call:  [phone] & follow prompts after 5pm & weekends Office Phone:  [phone] Office Fax:  [phone]

## 2017-04-18 NOTE — Telephone Encounter (Signed)
Ronnie Jackson with [facility] [address] called and stated that Dr. Mariea Clonts ordered ABI's but the actual order Dx was Chronic Venous Insuffiencey and Left Leg swelling and noted under Reason for Exam: Left thigh and leg pain Hx of blockage in legs, edema of left greater than right.  Rip Harbour stated those point to Venous. Questioning if you want a Venous or Arterial. Stated if you want an arterial they need all the venous dx and swelling taken away and reordered. Please Advise.

## 2017-04-18 NOTE — Patient Instructions (Addendum)
Cut out salt in your diet and try to avoid snacks that are salty.   Also reduce your cookies intake.    I ordered a study of the arteries of your legs to figure out why your left leg is so painful.

## 2017-04-18 NOTE — Telephone Encounter (Signed)
Ruling out arterial b/c of his report of prior "blockage" in his leg which I can't find further info about in the chart.

## 2017-04-19 ENCOUNTER — Other Ambulatory Visit: Payer: Self-pay

## 2017-04-19 DIAGNOSIS — M79605 Pain in left leg: Secondary | ICD-10-CM

## 2017-04-19 DIAGNOSIS — M79652 Pain in left thigh: Secondary | ICD-10-CM

## 2017-04-19 NOTE — Telephone Encounter (Signed)
I spoke with Rip Harbour with [facility] to find out how the correct order needed to be entered. She stated that order should say for left thigh/leg pain, and h/o blockage in one leg.   New order was placed and I called Rip Harbour to verify that it was correct, she stated that it was. She said patient would be called today and scheduled.

## 2017-04-22 ENCOUNTER — Other Ambulatory Visit: Payer: Self-pay | Admitting: *Deleted

## 2017-04-22 DIAGNOSIS — M25562 Pain in left knee: Principal | ICD-10-CM

## 2017-04-22 DIAGNOSIS — G894 Chronic pain syndrome: Secondary | ICD-10-CM

## 2017-04-22 DIAGNOSIS — M25561 Pain in right knee: Secondary | ICD-10-CM

## 2017-04-22 MED ORDER — MORPHINE SULFATE 30 MG PO TABS: ORAL_TABLET | ORAL | 0 refills | Status: DC

## 2017-04-22 MED FILL — MORPHINE SULFATE IR 30 MG T: 30 | 30 days supply | Qty: 120 | Fill #0

## 2017-04-22 NOTE — Telephone Encounter (Signed)
Patient wife, [name] requested and will pick up [employer].

## 2017-04-30 ENCOUNTER — Telehealth: Payer: Self-pay | Admitting: *Deleted

## 2017-04-30 NOTE — Telephone Encounter (Signed)
Received fax from [employer] [phone] stating Patient is NOT eligible to receive Eliquis due to Product being covered by insurance.

## 2017-05-15 MED FILL — FUROSEMIDE 40 MG TABS: 40 | 30 days supply | Qty: 60 | Fill #1

## 2017-05-15 NOTE — Telephone Encounter (Signed)
Received fax from [employer] [phone] stating Patient Assistance for Eliquis has been APPROVED from 05/14/2017-07/22/17.  Application Case#: [id number]

## 2017-05-28 ENCOUNTER — Other Ambulatory Visit: Payer: Self-pay

## 2017-05-28 MED ORDER — APIXABAN 5 MG PO TABS: ORAL_TABLET | ORAL | 5 refills | Status: DC

## 2017-05-28 MED ORDER — APIXABAN 5 MG PO TABS: ORAL_TABLET | ORAL | 11 refills | Status: DC

## 2017-05-28 NOTE — Telephone Encounter (Signed)
Patient assistance paperwork received for 2019, paperwork to be completed by Dr.Reed, then patient to complete his portion (mail to patient)   Patient to return once he completes his part for [facility] to fax. RX attached as requested on form for year supply.   Patient's wife informed of process and verbalized understanding

## 2017-06-03 ENCOUNTER — Other Ambulatory Visit: Payer: Self-pay | Admitting: Internal Medicine

## 2017-06-03 MED FILL — SULFAMETHOXAZOLE-TMP DS TAB: 800-160 | 30 days supply | Qty: 60 | Fill #0

## 2017-06-03 NOTE — Telephone Encounter (Signed)
Ok to fill this. 

## 2017-06-10 ENCOUNTER — Telehealth: Payer: Self-pay | Admitting: *Deleted

## 2017-06-10 NOTE — Telephone Encounter (Signed)
Received Letter from [employer] [phone] regarding they received a report from Manus Gunning regarding product, Eliquis, with an adverse event reported of Peripheral swelling. Additional information needed to complete the adverse event report. Questionnaire for Dr. Mariea Clonts to complete and would like faxed back to Fax #:[phone] Reference #: [id number] [phone] Placed in Dr. Cyndi Lennert folder to review and fill out.

## 2017-06-17 ENCOUNTER — Other Ambulatory Visit: Payer: Self-pay | Admitting: *Deleted

## 2017-06-17 DIAGNOSIS — G894 Chronic pain syndrome: Secondary | ICD-10-CM

## 2017-06-17 DIAGNOSIS — M25562 Pain in left knee: Principal | ICD-10-CM

## 2017-06-17 DIAGNOSIS — M25561 Pain in right knee: Secondary | ICD-10-CM

## 2017-06-17 MED ORDER — MORPHINE SULFATE 30 MG PO TABS: ORAL_TABLET | ORAL | 0 refills | Status: DC

## 2017-06-17 MED FILL — MORPHINE SULFATE IR 30 MG T: 30 | 30 days supply | Qty: 120 | Fill #0

## 2017-06-17 NOTE — Telephone Encounter (Signed)
Patient wife, [name] requested and will pick up. [place] [employer] Verified.

## 2017-07-10 MED FILL — FUROSEMIDE 40 MG TABS: 40 | 30 days supply | Qty: 60 | Fill #2

## 2017-07-17 ENCOUNTER — Other Ambulatory Visit: Payer: Self-pay | Admitting: *Deleted

## 2017-07-17 DIAGNOSIS — M25561 Pain in right knee: Secondary | ICD-10-CM

## 2017-07-17 DIAGNOSIS — M25562 Pain in left knee: Principal | ICD-10-CM

## 2017-07-17 DIAGNOSIS — G894 Chronic pain syndrome: Secondary | ICD-10-CM

## 2017-07-17 MED ORDER — MORPHINE SULFATE 30 MG PO TABS: ORAL_TABLET | ORAL | 0 refills | Status: DC

## 2017-07-17 MED FILL — MORPHINE SULFATE IR 30 MG T: 30 | 30 days supply | Qty: 120 | Fill #0

## 2017-07-17 NOTE — Telephone Encounter (Signed)
Patient wife notified.

## 2017-07-17 NOTE — Addendum Note (Signed)
Addended by: Rafael Bihari A on: 07/17/2017 10:09 AM   Modules accepted: Orders

## 2017-07-17 NOTE — Telephone Encounter (Signed)
[facility] is out of Patient's medication and suggested it to be faxed to the [facility].  Medication re-pended.

## 2017-07-17 NOTE — Telephone Encounter (Signed)
Patient wife, [name] called and requested refill on patient's Narcotic.  [place] Confirmed Pended Rx and sent to [facility] due to Dr. Mariea Clonts off.

## 2017-08-16 ENCOUNTER — Other Ambulatory Visit: Payer: Self-pay | Admitting: Nurse Practitioner

## 2017-08-16 ENCOUNTER — Other Ambulatory Visit: Payer: Self-pay | Admitting: Internal Medicine

## 2017-08-16 ENCOUNTER — Other Ambulatory Visit: Payer: Self-pay | Admitting: *Deleted

## 2017-08-16 DIAGNOSIS — G894 Chronic pain syndrome: Secondary | ICD-10-CM

## 2017-08-16 DIAGNOSIS — R609 Edema, unspecified: Secondary | ICD-10-CM

## 2017-08-16 DIAGNOSIS — M25561 Pain in right knee: Secondary | ICD-10-CM

## 2017-08-16 DIAGNOSIS — M25562 Pain in left knee: Principal | ICD-10-CM

## 2017-08-16 MED ORDER — MORPHINE SULFATE 30 MG PO TABS: ORAL_TABLET | ORAL | 0 refills | Status: DC

## 2017-08-16 MED ORDER — SULFAMETHOXAZOLE-TRIMETHOPRIM 800-160 MG PO TABS: ORAL_TABLET | ORAL | 0 refills | Status: DC

## 2017-08-16 MED ORDER — FUROSEMIDE 40 MG PO TABS: ORAL_TABLET | ORAL | 1 refills | Status: DC

## 2017-08-16 MED FILL — FUROSEMIDE 40 MG TAB: 40 | 30 days supply | Qty: 60 | Fill #0

## 2017-08-16 MED FILL — SULFAMETHOXAZOLE-TMP DS TAB: 800-160 | 30 days supply | Qty: 60 | Fill #0

## 2017-08-16 NOTE — Telephone Encounter (Signed)
Patient wife, [name] Requested [place] Confirmed Pended Rx and sent to Dr. Mariea Clonts to approve.

## 2017-08-16 NOTE — Telephone Encounter (Signed)
Patient wife called and wanted to know if Rx was sent to pharmacy. Requested to resend to Dr. Mariea Clonts due to other Rx to pick up at pharmacy.

## 2017-09-12 ENCOUNTER — Encounter: Payer: Self-pay | Admitting: Internal Medicine

## 2017-09-12 ENCOUNTER — Telehealth: Payer: Self-pay | Admitting: *Deleted

## 2017-09-12 ENCOUNTER — Ambulatory Visit (INDEPENDENT_AMBULATORY_CARE_PROVIDER_SITE_OTHER): Payer: PPO | Admitting: Internal Medicine

## 2017-09-12 ENCOUNTER — Ambulatory Visit
Admission: RE | Admit: 2017-09-12 | Discharge: 2017-09-12 | Disposition: A | Discharge status: Home / Self Care | Payer: PPO | Source: Ambulatory Visit | Attending: Internal Medicine | Admitting: Internal Medicine

## 2017-09-12 VITALS — BP 150/90 | HR 92 | Temp 98.1°F | Wt 227.0 lb

## 2017-09-12 DIAGNOSIS — M25551 Pain in right hip: Secondary | ICD-10-CM | POA: Diagnosis not present

## 2017-09-12 DIAGNOSIS — M48062 Spinal stenosis, lumbar region with neurogenic claudication: Secondary | ICD-10-CM

## 2017-09-12 DIAGNOSIS — I481 Persistent atrial fibrillation: Secondary | ICD-10-CM | POA: Diagnosis not present

## 2017-09-12 DIAGNOSIS — G894 Chronic pain syndrome: Secondary | ICD-10-CM

## 2017-09-12 DIAGNOSIS — E7800 Pure hypercholesterolemia, unspecified: Secondary | ICD-10-CM | POA: Diagnosis not present

## 2017-09-12 DIAGNOSIS — E669 Obesity, unspecified: Secondary | ICD-10-CM | POA: Diagnosis not present

## 2017-09-12 DIAGNOSIS — E1142 Type 2 diabetes mellitus with diabetic polyneuropathy: Secondary | ICD-10-CM | POA: Diagnosis not present

## 2017-09-12 DIAGNOSIS — M1611 Unilateral primary osteoarthritis, right hip: Secondary | ICD-10-CM | POA: Diagnosis not present

## 2017-09-12 DIAGNOSIS — M47816 Spondylosis without myelopathy or radiculopathy, lumbar region: Secondary | ICD-10-CM | POA: Diagnosis not present

## 2017-09-12 DIAGNOSIS — I4819 Other persistent atrial fibrillation: Secondary | ICD-10-CM

## 2017-09-12 MED ORDER — GABAPENTIN 100 MG PO CAPS: 100.0000 mg | ORAL_CAPSULE | Freq: Every day | ORAL | 1 refills | Status: DC

## 2017-09-12 NOTE — Patient Instructions (Signed)
Please go get your xrays of your right hip and back at [facility]. Until we get your xray reports, you may take ibuprofen after eating 400mg  twice a day for the hip pain. You may also put ice over the hip area.    After I get your reports, I may add gabapentin for your pain down your legs.  We may also consider a hip injection for bursitis.    Please cut down on sweets like cookies and salty snacks, salty foods that will worsen the swelling of your legs.

## 2017-09-12 NOTE — Telephone Encounter (Signed)
rx sent to pharmacy by e-script Spoke with patient's wife and advised results
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
## 2017-09-12 NOTE — Telephone Encounter (Signed)
-----   Message from Gayland Curry, DO sent at 09/12/2017  3:06 PM EST ----- Please notify pt or his wife (not sure he will be able to hear Xrays show advanced right hip arthritis.  For this, I'd like to refer him to orthopedics for further evaluation.   Xrays of lumbar spine show anterior skeletal hyperostosis which means his lower back is starting to fuse together.  I suspect the bone spurs are pushing on his nerves.  Let's try him on gabapentin 100mg  at bedtime for this pain.

## 2017-09-13 LAB — CBC WITH DIFFERENTIAL/PLATELET
Basophils Absolute: 18 cells/uL (ref 0–200)
Basophils Relative: 0.3 %
Eosinophils Absolute: 122 cells/uL (ref 15–500)
Eosinophils Relative: 2 %
HCT: 36.9 % — ABNORMAL LOW (ref 38.5–50.0)
Hemoglobin: 12.8 g/dL — ABNORMAL LOW (ref 13.2–17.1)
Lymphs Abs: 1763 cells/uL (ref 850–3900)
MCH: 30.9 pg (ref 27.0–33.0)
MCHC: 34.7 g/dL (ref 32.0–36.0)
MCV: 89.1 fL (ref 80.0–100.0)
MPV: 11.2 fL (ref 7.5–12.5)
Monocytes Relative: 8 %
Neutro Abs: 3709 cells/uL (ref 1500–7800)
Neutrophils Relative %: 60.8 %
Platelets: 143 10*3/uL (ref 140–400)
RBC: 4.14 10*6/uL — ABNORMAL LOW (ref 4.20–5.80)
RDW: 13 % (ref 11.0–15.0)
Total Lymphocyte: 28.9 %
WBC mixed population: 488 cells/uL (ref 200–950)
WBC: 6.1 10*3/uL (ref 3.8–10.8)

## 2017-09-13 LAB — COMPLETE METABOLIC PANEL WITH GFR
AG Ratio: 1.5 (calc) (ref 1.0–2.5)
ALT: 12 U/L (ref 9–46)
AST: 18 U/L (ref 10–35)
Albumin: 4.1 g/dL (ref 3.6–5.1)
Alkaline phosphatase (APISO): 83 U/L (ref 40–115)
BUN: 16 mg/dL (ref 7–25)
CO2: 31 mmol/L (ref 20–32)
Calcium: 8.8 mg/dL (ref 8.6–10.3)
Chloride: 104 mmol/L (ref 98–110)
Creat: 1.11 mg/dL (ref 0.70–1.11)
GFR, Est African American: 72 mL/min/{1.73_m2} (ref >=60)
GFR, Est Non African American: 62 mL/min/{1.73_m2} (ref >=60)
Globulin: 2.7 g/dL (calc) (ref 1.9–3.7)
Glucose, Bld: 121 mg/dL — ABNORMAL HIGH (ref 65–99)
Potassium: 3.7 mmol/L (ref 3.5–5.3)
Sodium: 142 mmol/L (ref 135–146)
Total Bilirubin: 0.9 mg/dL (ref 0.2–1.2)
Total Protein: 6.8 g/dL (ref 6.1–8.1)

## 2017-09-13 LAB — LIPID PANEL
Cholesterol: 144 mg/dL (ref <200)
HDL: 35 mg/dL — ABNORMAL LOW (ref >40)
LDL Cholesterol (Calc): 89 mg/dL (calc)
Non-HDL Cholesterol (Calc): 109 mg/dL (calc) (ref <130)
Total CHOL/HDL Ratio: 4.1 (calc) (ref <5.0)
Triglycerides: 102 mg/dL (ref <150)

## 2017-09-13 LAB — HEMOGLOBIN A1C
Hgb A1c MFr Bld: 6.7 % of total Hgb — ABNORMAL HIGH (ref <5.7)
Mean Plasma Glucose: 146 (calc)
eAG (mmol/L): 8.1 (calc)

## 2017-09-16 ENCOUNTER — Other Ambulatory Visit: Payer: Self-pay | Admitting: *Deleted

## 2017-09-16 DIAGNOSIS — M25562 Pain in left knee: Principal | ICD-10-CM

## 2017-09-16 DIAGNOSIS — M25561 Pain in right knee: Secondary | ICD-10-CM

## 2017-09-16 DIAGNOSIS — G894 Chronic pain syndrome: Secondary | ICD-10-CM

## 2017-09-16 MED ORDER — MORPHINE SULFATE 30 MG PO TABS: ORAL_TABLET | ORAL | 0 refills | Status: DC

## 2017-09-16 MED FILL — MORPHINE SULFATE IR 30 MG T: 30 | 30 days supply | Qty: 120 | Fill #0

## 2017-09-16 NOTE — Telephone Encounter (Signed)
Patient wife requested.  [place] Verified Pharmacy Confirmed.  Pended Rx and sent to Dr. Mariea Clonts for approval.

## 2017-09-16 NOTE — Telephone Encounter (Signed)
Patient wife called regarding status of Rx. Needing to pick up today.

## 2017-09-23 MED FILL — GABAPENTIN 100 MG CAPSULE: 100 | 30 days supply | Qty: 30 | Fill #0

## 2017-09-23 NOTE — Telephone Encounter (Signed)
Patient's wife calling asking for referral to orthopedics. I asked wife how patient was doing on the gabapentin. Per wife they never picked it up because several people stated it has side effects. I advised people react differently to medications and they might need to give it a try.

## 2017-09-23 NOTE — Addendum Note (Signed)
Addended by: Despina Hidden on: 09/23/2017 10:06 AM   Modules accepted: Orders

## 2017-09-23 NOTE — Telephone Encounter (Signed)
I agree, Ronnie Jackson, that he needs to try the gabapentin first for at least 3-5 days before I will place the orthopedics referral.  Let's also send him to physical therapy.

## 2017-09-23 NOTE — Telephone Encounter (Signed)
Spoke with wife and they will try the gabapentin and call back to let us know how it's working.

## 2017-10-03 ENCOUNTER — Ambulatory Visit (INDEPENDENT_AMBULATORY_CARE_PROVIDER_SITE_OTHER): Payer: PPO | Admitting: Internal Medicine

## 2017-10-03 ENCOUNTER — Encounter: Payer: Self-pay | Admitting: Internal Medicine

## 2017-10-03 VITALS — BP 140/80 | HR 93 | Temp 97.6°F | Wt 229.0 lb

## 2017-10-03 DIAGNOSIS — G894 Chronic pain syndrome: Secondary | ICD-10-CM | POA: Diagnosis not present

## 2017-10-03 DIAGNOSIS — M1611 Unilateral primary osteoarthritis, right hip: Secondary | ICD-10-CM | POA: Diagnosis not present

## 2017-10-03 DIAGNOSIS — M5136 Other intervertebral disc degeneration, lumbar region: Secondary | ICD-10-CM | POA: Diagnosis not present

## 2017-10-03 MED ORDER — GABAPENTIN 100 MG PO CAPS: 100.0000 mg | ORAL_CAPSULE | Freq: Every day | ORAL | 1 refills | Status: DC

## 2017-10-03 NOTE — Progress Notes (Signed)
Location:  [facility] clinic Provider:  Tiffany L. Mariea Clonts, D.O., C.M.D.  Code Status: FC Goals of Care:  Advanced Directives 09/12/2017  Does Patient Have a Medical Advance Directive? No  Would patient like information on creating a medical advance directive? No - Patient declined     Chief Complaint  Patient presents with  . Follow-up    2 week follow-up    HPI: Patient is a 82 y.o. male seen today for medical management of chronic diseases.  Reports not feeling better at all, mostly feels worse. He does not like using a cane, rather use a crutch. His pain score 8/10  today in office when walking around, but when sitting he is okay. He did have one day last week that he noticed it was better, but then it came right back. He did not get PT started from last visit. He also did not start the gabapentin due to worry of side effects. Both is groin, lateral hip, and lower back give him pain. He is also still experiencing the radiation into his leg(s). Xray demonstrates advanced OA of right hip and DDD of lower back.    Past Medical History:  Diagnosis Date  . Anxiety state, unspecified   . Atrial fibrillation ([place]) 03/06/2016  . Chronic pain syndrome   . Constipation   . Depressive disorder, not elsewhere classified   . Dysphagia, unspecified(787.20)   . Herpes simplex disease   . Hyperlipidemia   . Hypertension   . Hypopotassemia   . Insomnia, unspecified   . Intestinovesical fistula   . Malignant neoplasm of prostate ([place])   . Myalgia and myositis, unspecified   . Osteoarthritis   . Pain in joint, lower leg    Bilateral knee pains  . Reflux esophagitis   . Rheumatoid arthritis with rheumatoid factor (HCC)   . Spinal stenosis, unspecified region other than cervical   . Thrombocytopenia, unspecified ([place])   . Type II or unspecified type diabetes mellitus without mention of complication, not stated as uncontrolled   . Unspecified arthropathy, pelvic region and thigh   . Unspecified  hereditary and idiopathic peripheral neuropathy     Past Surgical History:  Procedure Laterality Date  . CARDIAC CATHETERIZATION  01/31/2004   Dr Gwenlyn Found  . CARDIOVERSION N/A 07/12/2016   Procedure: CARDIOVERSION;  Surgeon: Pixie Casino, MD;  Location: [facility];  Service: Cardiovascular;  Laterality: N/A;  . COLONOSCOPY  08/09/2006   Dr Christian Mate, hemorrhoids/rectal fistula  . cystocopy  07/2006   Dr Harlow Asa  . NASAL SINUS SURGERY  1992  . PROSTATE SURGERY  1991    Allergies  Allergen Reactions  . Indocin [Indomethacin]   . Methadone     Outpatient Encounter Medications as of 10/03/2017  Medication Sig  . apixaban (ELIQUIS) 5 MG TABS tablet TAKE 1 TABLET BY MOUTH TWICE DAILY TO HELP PREVENT STROKE RELATED TO ATRIAL FIBRILLATION  . cromolyn (OPTICROM) 4 % ophthalmic solution Place 1 drop into both eyes 4 (four) times daily.  . furosemide (LASIX) 40 MG tablet Take one tablet by mouth twice daily to prevent fluid retention  . morphine (MSIR) 30 MG tablet Take two tablets by mouth every 12 hours as needed for pain  . [DISCONTINUED] gabapentin (NEURONTIN) 100 MG capsule Take 1 capsule (100 mg total) by mouth at bedtime.   No facility-administered encounter medications on file as of 10/03/2017.     Review of Systems:  Review of Systems  Constitutional: Negative.   HENT: Positive for hearing  loss.        No hearing aides  Respiratory: Negative for cough and shortness of breath.   Cardiovascular: Positive for leg swelling. Negative for chest pain and palpitations.  Musculoskeletal: Positive for back pain and joint pain.  Neurological: Positive for tingling and sensory change.    Health Maintenance  Topic Date Due  . FOOT EXAM  12/22/2015  . TETANUS/TDAP  11/01/2024 (Originally 08/02/1954)  . URINE MICROALBUMIN  11/15/2017  . OPHTHALMOLOGY EXAM  01/15/2018  . HEMOGLOBIN A1C  03/12/2018  . INFLUENZA VACCINE  Completed  . PNA vac Low Risk Adult  Completed    Physical  Exam: Vitals:   10/03/17 1354  BP: 140/80  Pulse: 93  Temp: 97.6 F (36.4 C)  TempSrc: Oral  SpO2: 98%  Weight: 229 lb (103.9 kg)   Body mass index is 32.86 kg/m. Physical Exam  Constitutional: He appears well-developed and well-nourished.  HENT:  Head: Normocephalic.  HOH no hearing aides  Cardiovascular: Intact distal pulses. An irregularly irregular rhythm present.  Non-pitting edema of left (greater) ankle  Pulmonary/Chest: Effort normal and breath sounds normal.  Musculoskeletal: He exhibits tenderness.  Right lateral hip tenderness.  Upper buttock region; right leg appears to be trying to give way when stands up and feels neuropathic pain down both legs   Neurological: He is alert.  Vitals reviewed.   Labs reviewed: Basic Metabolic Panel: Recent Labs    11/15/16 0957 04/15/17 0908 09/12/17 1015  NA 144 140 142  K 3.6 3.8 3.7  CL 104 103 104  CO2 22 30 31   GLUCOSE 122* 120* 121*  BUN 12 20 16   CREATININE 1.23* 1.34* 1.11  CALCIUM 8.7 8.4* 8.8   Liver Function Tests: Recent Labs    11/15/16 0957 09/12/17 1015  AST 20 18  ALT 12 12  ALKPHOS 72  --   BILITOT 0.9 0.9  PROT 6.7 6.8  ALBUMIN 4.2  --    No results for input(s): LIPASE, AMYLASE in the last 8760 hours. No results for input(s): AMMONIA in the last 8760 hours. CBC: Recent Labs    01/10/17 09/12/17 1015  WBC 10.4 6.1  NEUTROABS 6 3,709  HGB 13.8 12.8*  HCT 42 36.9*  MCV  --  89.1  PLT 189 143   Lipid Panel: Recent Labs    04/15/17 0908 09/12/17 1015  CHOL 147 144  HDL 31* 35*  LDLCALC 92 89  TRIG 138 102  CHOLHDL 4.7 4.1   Lab Results  Component Value Date   HGBA1C 6.7 (H) 09/12/2017    Procedures since last visit: Dg Lumbar Spine Complete  Result Date: 09/12/2017 CLINICAL DATA:  Chronic and intermittent right hip pain and low back pain. EXAM: LUMBAR SPINE - COMPLETE 4+ VIEW COMPARISON:  07/07/2015 FINDINGS: No significant spinal curvature. Straightening of the normal  lordosis. No disc space narrowing. Prominent bridging anterior osteophytes suggesting diffuse idiopathic skeletal hyperostosis versus spondylosis deformans. No sign of fracture. No focal bone lesion. Sacroiliac joints appear normal. Advanced osteoarthritis of the right hip. IMPRESSION: Prominent anterior osteophytes which could be seen with diffuse idiopathic skeletal hyperostosis or spondylosis deformans. No disc space narrowing or acute bone finding. Advanced osteoarthritis of the right hip. Electronically Signed   By: Nelson Chimes M.D.   On: 09/12/2017 12:51   Dg Hip Unilat With Pelvis 2-3 Views Right  Result Date: 09/12/2017 CLINICAL DATA:  Right hip and back pain. EXAM: DG HIP (WITH OR WITHOUT PELVIS) 2-3V RIGHT COMPARISON:  07/07/2015.  FINDINGS: Degenerative changes lumbar spine and both hips. No acute bony abnormality identified. No evidence of fracture dislocation. Pelvic calcifications consistent with phleboliths. Surgical clips are noted the pelvis. IMPRESSION: 1. Degenerative changes lumbar spine and both hips. No acute abnormality identified. 2.  Peripheral vascular disease. Electronically Signed   By: Marcello Moores  Register   On: 09/12/2017 12:50    Assessment/Plan  1. Chronic pain syndrome He has been on morphine for years due to an allergy with methadone this is not used. He has started to have more persistent pain that the morphine is not helping with. Imagining showed advanced OA of right hip and DDD of lumbar.  Will order PT, gabapentin, as well as will refer to ortho. We appreciate the collaboration with his care.   - Ambulatory referral to Physical Therapy  2. Primary osteoarthritis of right hip He has advanced OA in the right hip which radiates into the groin and and medial thigh. He continues to not have much relief with his morphine.Will order PT and refer to Ortho for advice on treatment plans and if he is a candidate for surgery. He has a Hx of a.fib on eliquis, renal dysfunction,  and DM. These all place him at a higher risk with his age for surgery.   - Ambulatory referral to Physical Therapy  3. DDD (degenerative disc disease), lumbar See above for plan of care. Will await the PT and Ortho assessments.   Labs/tests ordered:  No orders of the defined types were placed in this encounter.   Next appt:  Visit date not found   Karen Kays, RN, AGPCNP, DNP [facility] [phone], [place] [address] Cell Phone (Mon-Fri 8am-5pm):  [phone] On Call:  [phone] & follow prompts after 5pm & weekends Office Phone:  [phone] Office Fax:  [phone]

## 2017-10-03 NOTE — Patient Instructions (Signed)
Wear your hearing aids Call me with the orthopedic surgeon's name that you want to see--we will send you for therapy wherever you want to see the orthopedic surgeon.

## 2017-10-14 ENCOUNTER — Other Ambulatory Visit: Payer: Self-pay | Admitting: *Deleted

## 2017-10-14 DIAGNOSIS — M25562 Pain in left knee: Principal | ICD-10-CM

## 2017-10-14 DIAGNOSIS — M25561 Pain in right knee: Secondary | ICD-10-CM

## 2017-10-14 DIAGNOSIS — G894 Chronic pain syndrome: Secondary | ICD-10-CM

## 2017-10-14 MED ORDER — MORPHINE SULFATE 30 MG PO TABS: ORAL_TABLET | ORAL | 0 refills | Status: DC

## 2017-10-14 MED FILL — MORPHINE SULFATE IR 30 MG T: 30 | 30 days supply | Qty: 120 | Fill #0

## 2017-10-14 NOTE — Telephone Encounter (Signed)
Wife, [name] called requesting refill on Narcotic Rx.  [place] Verified LR: 09/16/17 Pharmacy Confirmed Pended Rx and sent to Dr. Mariea Clonts for approval.

## 2017-10-23 ENCOUNTER — Telehealth: Payer: Self-pay | Admitting: *Deleted

## 2017-10-23 NOTE — Telephone Encounter (Signed)
Received Handicap Placard. Filled out and placed in Dr. Cyndi Lennert folder to review and sign. Call patient once ready for pick up.

## 2017-10-24 NOTE — Telephone Encounter (Signed)
Left message on machine that handicap form is ready for pick-up and will be at the front desk

## 2017-11-11 ENCOUNTER — Telehealth: Payer: Self-pay | Admitting: Internal Medicine

## 2017-11-11 NOTE — Telephone Encounter (Signed)
I called the pt to schedule AWV-S with Clarise Cruz on afternoon of 11/20/17.  His wife stated that she will call me back.  VDM (DD)

## 2017-11-13 ENCOUNTER — Other Ambulatory Visit: Payer: Self-pay | Admitting: *Deleted

## 2017-11-13 DIAGNOSIS — M25561 Pain in right knee: Secondary | ICD-10-CM

## 2017-11-13 DIAGNOSIS — G894 Chronic pain syndrome: Secondary | ICD-10-CM

## 2017-11-13 DIAGNOSIS — M25562 Pain in left knee: Principal | ICD-10-CM

## 2017-11-13 MED ORDER — MORPHINE SULFATE 30 MG PO TABS: ORAL_TABLET | ORAL | 0 refills | Status: DC

## 2017-11-13 MED ORDER — SULFAMETHOXAZOLE-TRIMETHOPRIM 800-160 MG PO TABS: 1.0000 | ORAL_TABLET | Freq: Two times a day (BID) | ORAL | 1 refills | Status: DC

## 2017-11-13 MED FILL — SULFAMETHOXAZOLE-TMP DS TAB: 800-160 | 30 days supply | Qty: 60 | Fill #0

## 2017-11-13 MED FILL — MORPHINE SULFATE IR 30 MG T: 30 | 30 days supply | Qty: 120 | Fill #0

## 2017-11-13 NOTE — Telephone Encounter (Signed)
Patient wife called requesting refill for patient's pain medication and preventative antibiotic.  [place] Verified LR: 10/14/17 Pharmacy Confirmed Pended Rx and sent to Dr. Mariea Clonts for approval.

## 2017-11-20 ENCOUNTER — Ambulatory Visit (INDEPENDENT_AMBULATORY_CARE_PROVIDER_SITE_OTHER): Payer: PPO

## 2017-11-20 VITALS — BP 138/80 | HR 91 | Temp 98.3°F | Ht 70.0 in | Wt 219.0 lb

## 2017-11-20 DIAGNOSIS — Z0000 Encounter for general adult medical examination without abnormal findings: Secondary | ICD-10-CM

## 2017-11-20 NOTE — Progress Notes (Signed)
Subjective:   Ronnie Jackson is a 82 y.o. male who presents for Medicare Annual/Subsequent preventive examination.  Last AWV-11/15/2016       Objective:    Vitals: BP 138/80 (BP Location: Left Arm, Patient Position: Sitting)   Pulse 91   Temp 98.3 F (36.8 C) (Oral)   Ht 5\' 10"  (1.778 m)   Wt 219 lb (99.3 kg)   SpO2 95%   BMI 31.42 kg/m   Body mass index is 31.42 kg/m.  Advanced Directives 11/20/2017 09/12/2017 11/21/2016 11/15/2016 08/21/2016 07/12/2016 03/20/2016  Does Patient Have a Medical Advance Directive? No No No No No No No  Would patient like information on creating a medical advance directive? Yes (MAU/Ambulatory/Procedural Areas - Information given) No - Patient declined - No - Patient declined - No - Patient declined -    Tobacco Social History   Tobacco Use  Smoking Status Never Smoker  Smokeless Tobacco Current User  . Types: Chew  Tobacco Comment   Chews tobacco daily      Ready to quit: Not Answered Counseling given: Not Answered Comment: Chews tobacco daily    Clinical Intake:  Pre-visit preparation completed: No  Pain : 0-10 Pain Score: 8  Pain Type: Chronic pain Pain Location: Hip Pain Orientation: Right Pain Radiating Towards: back Pain Descriptors / Indicators: Sharp Pain Onset: More than a month ago Pain Frequency: Intermittent     Nutritional Risks: None Diabetes: Yes CBG done?: No Did pt. bring in CBG monitor from home?: No  How often do you need to have someone help you when you read instructions, pamphlets, or other written materials from your doctor or pharmacy?: 1 - Never What is the last grade level you completed in school?: HS  Interpreter Needed?: No  Information entered by :: Tyson Dense, RN  Past Medical History:  Diagnosis Date  . Anxiety state, unspecified   . Atrial fibrillation ([place]) 03/06/2016  . Chronic pain syndrome   . Constipation   . Depressive disorder, not elsewhere classified   . Dysphagia,  unspecified(787.20)   . Herpes simplex disease   . Hyperlipidemia   . Hypertension   . Hypopotassemia   . Insomnia, unspecified   . Intestinovesical fistula   . Malignant neoplasm of prostate ([place])   . Myalgia and myositis, unspecified   . Osteoarthritis   . Pain in joint, lower leg    Bilateral knee pains  . Reflux esophagitis   . Rheumatoid arthritis with rheumatoid factor (HCC)   . Spinal stenosis, unspecified region other than cervical   . Thrombocytopenia, unspecified ([place])   . Type II or unspecified type diabetes mellitus without mention of complication, not stated as uncontrolled   . Unspecified arthropathy, pelvic region and thigh   . Unspecified hereditary and idiopathic peripheral neuropathy    Past Surgical History:  Procedure Laterality Date  . CARDIAC CATHETERIZATION  01/31/2004   Dr Gwenlyn Found  . CARDIOVERSION N/A 07/12/2016   Procedure: CARDIOVERSION;  Surgeon: Pixie Casino, MD;  Location: [facility];  Service: Cardiovascular;  Laterality: N/A;  . COLONOSCOPY  08/09/2006   Dr Christian Mate, hemorrhoids/rectal fistula  . cystocopy  07/2006   Dr Harlow Asa  . NASAL SINUS SURGERY  1992  . PROSTATE SURGERY  1991   Family History  Problem Relation Age of Onset  . Prostate cancer Father   . Heart failure Mother   . Lung cancer Daughter   . Arthritis Sister    Social History   Socioeconomic History  .  Marital status: Married    Spouse name: Not on file  . Number of children: Not on file  . Years of education: Not on file  . Highest education level: Not on file  Occupational History  . Not on file  Social Needs  . Financial resource strain: Not hard at all  . Food insecurity:    Worry: Never true    Inability: Never true  . Transportation needs:    Medical: No    Non-medical: No  Tobacco Use  . Smoking status: Never Smoker  . Smokeless tobacco: Current User    Types: Chew  . Tobacco comment: Chews tobacco daily   Substance and Sexual Activity  . Alcohol  use: No  . Drug use: No  . Sexual activity: Not Currently  Lifestyle  . Physical activity:    Days per week: 0 days    Minutes per session: 0 min  . Stress: To some extent  Relationships  . Social connections:    Talks on phone: More than three times a week    Gets together: More than three times a week    Attends religious service: More than 4 times per year    Active member of club or organization: No    Attends meetings of clubs or organizations: Never    Relationship status: Married  Other Topics Concern  . Not on file  Social History Narrative  . Not on file    Outpatient Encounter Medications as of 11/20/2017  Medication Sig  . apixaban (ELIQUIS) 5 MG TABS tablet TAKE 1 TABLET BY MOUTH TWICE DAILY TO HELP PREVENT STROKE RELATED TO ATRIAL FIBRILLATION  . cromolyn (OPTICROM) 4 % ophthalmic solution Place 1 drop into both eyes 4 (four) times daily.  . furosemide (LASIX) 40 MG tablet Take one tablet by mouth twice daily to prevent fluid retention  . morphine (MSIR) 30 MG tablet Take two tablets by mouth every 12 hours as needed for pain  . sulfamethoxazole-trimethoprim (BACTRIM DS,SEPTRA DS) 800-160 MG tablet Take 1 tablet by mouth 2 (two) times daily. To control infection  . [DISCONTINUED] gabapentin (NEURONTIN) 100 MG capsule Take 1 capsule (100 mg total) by mouth at bedtime.   No facility-administered encounter medications on file as of 11/20/2017.     Activities of Daily Living In your present state of health, do you have any difficulty performing the following activities: 11/20/2017  Hearing? Y  Vision? N  Difficulty concentrating or making decisions? Y  Walking or climbing stairs? Y  Dressing or bathing? N  Doing errands, shopping? N  Preparing Food and eating ? N  Using the Toilet? N  In the past six months, have you accidently leaked urine? N  Do you have problems with loss of bowel control? N  Managing your Medications? Y  Managing your Finances? N  Housekeeping  or managing your Housekeeping? N  Some recent data might be hidden    Patient Care Team: Gayland Curry, DO as PCP - General (Geriatric Medicine) Tomasita Morrow, NP as Nurse Practitioner (Cardiology) Deneise Lever, MD as Consulting Physician (Pulmonary Disease) Kristeen Miss, MD as Consulting Physician (Neurosurgery) Ronny Bacon, MD as Referring Physician (Surgery) [name], Fransico Him., MD (General Surgery) Bo Merino, MD as Consulting Physician (Rheumatology)   Assessment:   This is a routine wellness examination for [place].  Exercise Activities and Dietary recommendations Current Exercise Habits: The patient does not participate in regular exercise at present, Exercise limited by: orthopedic condition(s)  Goals    .  move around more     Starting 11/15/2016 I would like to move around more        Fall Risk Fall Risk  11/20/2017 09/12/2017 04/18/2017 11/15/2016 08/21/2016  Falls in the past year? No No No Yes No  Number falls in past yr: - - - 2 or more -  Injury with Fall? - - - No -   Is the patient's home free of loose throw rugs in walkways, pet beds, electrical cords, etc?   yes      Grab bars in the bathroom? yes      Handrails on the stairs?   yes      Adequate lighting?   yes   Depression Screen PHQ 2/9 Scores 11/20/2017 09/12/2017 04/18/2017 11/15/2016  PHQ - 2 Score 0 0 0 0  PHQ- 9 Score - - - -    Cognitive Function MMSE - Mini Mental State Exam 11/20/2017 11/15/2016  Orientation to time 3 4  Orientation to Place 5 4  Registration 3 3  Attention/ Calculation 5 5  Recall 2 2  Language- name 2 objects 2 2  Language- repeat 1 1  Language- follow 3 step command 3 3  Language- read & follow direction 1 1  Write a sentence 1 1  Copy design 1 1  Total score 27 27        Immunization History  Administered Date(s) Administered  . Influenza, High Dose Seasonal PF 04/18/2017  . Influenza,inj,Quad PF,6+ Mos 06/21/2015  . Pneumococcal  Conjugate-13 06/21/2015  . Pneumococcal Polysaccharide-23 11/15/2016    Qualifies for Shingles Vaccine? Yes, educated and declined  Screening Tests Health Maintenance  Topic Date Due  . FOOT EXAM  12/22/2015  . URINE MICROALBUMIN  11/15/2017  . TETANUS/TDAP  11/01/2024 (Originally 08/02/1954)  . OPHTHALMOLOGY EXAM  01/15/2018  . INFLUENZA VACCINE  02/20/2018  . HEMOGLOBIN A1C  03/12/2018  . PNA vac Low Risk Adult  Completed   Cancer Screenings: Lung: Low Dose CT Chest recommended if Age 76-80 years, 30 pack-year currently smoking OR have quit w/in 15years. Patient does not qualify. Colorectal: up to date  Additional Screenings:  Hepatitis C Screening:declined  TDAP due and declined    Plan:    I have personally reviewed and addressed the Medicare Annual Wellness questionnaire and have noted the following in the patient's chart:  A. Medical and social history B. Use of alcohol, tobacco or illicit drugs  C. Current medications and supplements D. Functional ability and status E.  Nutritional status F.  Physical activity G. Advance directives H. List of other physicians I.  Hospitalizations, surgeries, and ER visits in previous 12 months J.  [place] to include hearing, vision, cognitive, depression L. Referrals and appointments - none  In addition, I have reviewed and discussed with patient certain preventive protocols, quality metrics, and best practice recommendations. A written personalized care plan for preventive services as well as general preventive health recommendations were provided to patient.  See attached scanned questionnaire for additional information.   Signed,   Tyson Dense, RN Nurse Health Advisor  Patient Concerns: Hurting to walk from R hip and back. Achy all over and said it stated when he began eliquis. L leg +1 edema

## 2017-11-20 NOTE — Patient Instructions (Signed)
[name] , Thank you for taking time to come for your Medicare Wellness Visit. I appreciate your ongoing commitment to your health goals. Please review the following plan we discussed and let me know if I can assist you in the future.   Screening recommendations/referrals: Colonoscopy excluded Recommended yearly ophthalmology/optometry visit for glaucoma screening and checkup Recommended yearly dental visit for hygiene and checkup  Vaccinations: Influenza vaccine up to date, due 2019 fall season Pneumococcal vaccine up to date, completed Tdap vaccine due, declined Shingles vaccine due, declined    Advanced directives: Advance directive discussed with you today. I have provided a copy for you to complete at home and have notarized. Once this is complete please bring a copy in to our office so we can scan it into your chart.  Conditions/risks identified: None  Next appointment: Tyson Dense, RN 11/24/2018 @ 12:45pm  Preventive Care 65 Years and Older, Male Preventive care refers to lifestyle choices and visits with your health care provider that can promote health and wellness. What does preventive care include?  A yearly physical exam. This is also called an annual well check.  Dental exams once or twice a year.  Routine eye exams. Ask your health care provider how often you should have your eyes checked.  Personal lifestyle choices, including:  Daily care of your teeth and gums.  Regular physical activity.  Eating a healthy diet.  Avoiding tobacco and drug use.  Limiting alcohol use.  Practicing safe sex.  Taking low doses of aspirin every day.  Taking vitamin and mineral supplements as recommended by your health care provider. What happens during an annual well check? The services and screenings done by your health care provider during your annual well check will depend on your age, overall health, lifestyle risk factors, and family history of disease. Counseling    Your health care provider may ask you questions about your:  Alcohol use.  Tobacco use.  Drug use.  Emotional well-being.  Home and relationship well-being.  Sexual activity.  Eating habits.  History of falls.  Memory and ability to understand (cognition).  Work and work Statistician. Screening  You may have the following tests or measurements:  Height, weight, and BMI.  Blood pressure.  Lipid and cholesterol levels. These may be checked every 5 years, or more frequently if you are over 72 years old.  Skin check.  Lung cancer screening. You may have this screening every year starting at age 70 if you have a 30-pack-year history of smoking and currently smoke or have quit within the past 15 years.  Fecal occult blood test (FOBT) of the stool. You may have this test every year starting at age 44.  Flexible sigmoidoscopy or colonoscopy. You may have a sigmoidoscopy every 5 years or a colonoscopy every 10 years starting at age 54.  Prostate cancer screening. Recommendations will vary depending on your family history and other risks.  Hepatitis C blood test.  Hepatitis B blood test.  Sexually transmitted disease (STD) testing.  Diabetes screening. This is done by checking your blood sugar (glucose) after you have not eaten for a while (fasting). You may have this done every 1-3 years.  Abdominal aortic aneurysm (AAA) screening. You may need this if you are a current or former smoker.  Osteoporosis. You may be screened starting at age 45 if you are at high risk. Talk with your health care provider about your test results, treatment options, and if necessary, the need for more tests.  Vaccines  Your health care provider may recommend certain vaccines, such as:  Influenza vaccine. This is recommended every year.  Tetanus, diphtheria, and acellular pertussis (Tdap, Td) vaccine. You may need a Td booster every 10 years.  Zoster vaccine. You may need this after age  35.  Pneumococcal 13-valent conjugate (PCV13) vaccine. One dose is recommended after age 45.  Pneumococcal polysaccharide (PPSV23) vaccine. One dose is recommended after age 34. Talk to your health care provider about which screenings and vaccines you need and how often you need them. This information is not intended to replace advice given to you by your health care provider. Make sure you discuss any questions you have with your health care provider. Document Released: 08/05/2015 Document Revised: 03/28/2016 Document Reviewed: 05/10/2015 Elsevier Interactive Patient Education  2017 [place] Prevention in the Home Falls can cause injuries. They can happen to people of all ages. There are many things you can do to make your home safe and to help prevent falls. What can I do on the outside of my home?  Regularly fix the edges of walkways and driveways and fix any cracks.  Remove anything that might make you trip as you walk through a door, such as a raised step or threshold.  Trim any bushes or trees on the path to your home.  Use bright outdoor lighting.  Clear any walking paths of anything that might make someone trip, such as rocks or tools.  Regularly check to see if handrails are loose or broken. Make sure that both sides of any steps have handrails.  Any raised decks and porches should have guardrails on the edges.  Have any leaves, snow, or ice cleared regularly.  Use sand or salt on walking paths during winter.  Clean up any spills in your garage right away. This includes oil or grease spills. What can I do in the bathroom?  Use night lights.  Install grab bars by the toilet and in the tub and shower. Do not use towel bars as grab bars.  Use non-skid mats or decals in the tub or shower.  If you need to sit down in the shower, use a plastic, non-slip stool.  Keep the floor dry. Clean up any water that spills on the floor as soon as it happens.  Remove  soap buildup in the tub or shower regularly.  Attach bath mats securely with double-sided non-slip rug tape.  Do not have throw rugs and other things on the floor that can make you trip. What can I do in the bedroom?  Use night lights.  Make sure that you have a light by your bed that is easy to reach.  Do not use any sheets or blankets that are too big for your bed. They should not hang down onto the floor.  Have a firm chair that has side arms. You can use this for support while you get dressed.  Do not have throw rugs and other things on the floor that can make you trip. What can I do in the kitchen?  Clean up any spills right away.  Avoid walking on wet floors.  Keep items that you use a lot in easy-to-reach places.  If you need to reach something above you, use a strong step stool that has a grab bar.  Keep electrical cords out of the way.  Do not use floor polish or wax that makes floors slippery. If you must use wax, use non-skid floor wax.  Do not have throw rugs and other things on the floor that can make you trip. What can I do with my stairs?  Do not leave any items on the stairs.  Make sure that there are handrails on both sides of the stairs and use them. Fix handrails that are broken or loose. Make sure that handrails are as long as the stairways.  Check any carpeting to make sure that it is firmly attached to the stairs. Fix any carpet that is loose or worn.  Avoid having throw rugs at the top or bottom of the stairs. If you do have throw rugs, attach them to the floor with carpet tape.  Make sure that you have a light switch at the top of the stairs and the bottom of the stairs. If you do not have them, ask someone to add them for you. What else can I do to help prevent falls?  Wear shoes that:  Do not have high heels.  Have rubber bottoms.  Are comfortable and fit you well.  Are closed at the toe. Do not wear sandals.  If you use a  stepladder:  Make sure that it is fully opened. Do not climb a closed stepladder.  Make sure that both sides of the stepladder are locked into place.  Ask someone to hold it for you, if possible.  Clearly mark and make sure that you can see:  Any grab bars or handrails.  First and last steps.  Where the edge of each step is.  Use tools that help you move around (mobility aids) if they are needed. These include:  Canes.  Walkers.  Scooters.  Crutches.  Turn on the lights when you go into a dark area. Replace any light bulbs as soon as they burn out.  Set up your furniture so you have a clear path. Avoid moving your furniture around.  If any of your floors are uneven, fix them.  If there are any pets around you, be aware of where they are.  Review your medicines with your doctor. Some medicines can make you feel dizzy. This can increase your chance of falling. Ask your doctor what other things that you can do to help prevent falls. This information is not intended to replace advice given to you by your health care provider. Make sure you discuss any questions you have with your health care provider. Document Released: 05/05/2009 Document Revised: 12/15/2015 Document Reviewed: 08/13/2014 Elsevier Interactive Patient Education  2017 [employer].

## 2017-11-21 ENCOUNTER — Other Ambulatory Visit: Payer: Self-pay | Admitting: Internal Medicine

## 2017-11-21 DIAGNOSIS — E7800 Pure hypercholesterolemia, unspecified: Secondary | ICD-10-CM

## 2017-11-21 DIAGNOSIS — I4819 Other persistent atrial fibrillation: Secondary | ICD-10-CM

## 2017-11-21 DIAGNOSIS — G894 Chronic pain syndrome: Secondary | ICD-10-CM

## 2017-11-21 DIAGNOSIS — E1142 Type 2 diabetes mellitus with diabetic polyneuropathy: Secondary | ICD-10-CM

## 2017-11-21 DIAGNOSIS — Z5181 Encounter for therapeutic drug level monitoring: Secondary | ICD-10-CM

## 2017-11-21 NOTE — Progress Notes (Signed)
Lab orders placed to be done when lab appt and f/u visit scheduled.

## 2017-12-24 ENCOUNTER — Other Ambulatory Visit: Payer: Self-pay | Admitting: *Deleted

## 2017-12-24 DIAGNOSIS — G894 Chronic pain syndrome: Secondary | ICD-10-CM

## 2017-12-24 DIAGNOSIS — M25561 Pain in right knee: Secondary | ICD-10-CM

## 2017-12-24 DIAGNOSIS — M25562 Pain in left knee: Principal | ICD-10-CM

## 2017-12-24 NOTE — Telephone Encounter (Signed)
eprescribe is not going through for some reason.  I've tried a couple of times.  It lets me sign it, but then it says it won't go through.

## 2017-12-24 NOTE — Telephone Encounter (Signed)
Patient wife requested [facility]: 11/13/2017 Pharmacy confirmed Pended Rx and sent to Dr. Mariea Clonts to approve

## 2017-12-25 MED ORDER — MORPHINE SULFATE 30 MG PO TABS: ORAL_TABLET | ORAL | 0 refills | Status: DC

## 2017-12-25 MED FILL — MORPHINE SULFATE IR 30 MG T: 30 | 30 days supply | Qty: 120 | Fill #0

## 2017-12-25 NOTE — Telephone Encounter (Signed)
Forwarded Rx to [place] to see if she can sign off on Rx. Coralyn Mark made aware.

## 2018-01-02 ENCOUNTER — Ambulatory Visit: Payer: PPO | Admitting: Internal Medicine

## 2018-01-07 ENCOUNTER — Telehealth: Payer: Self-pay

## 2018-01-07 NOTE — Telephone Encounter (Signed)
Please call Mrs. Totino back:  I'm not in the office before Thursday and he will need a prescription from me with the lab orders.  If he comes to the office, the labs can be done there--at [facility] (they're already ordered and he was meant to do that before the visit). He also did not call me back previously with the name of the orthopedic surgeon he wanted to see about his hip and back.    If it's not possible to do the labs before the visit, he should come fasting Thursday and his labs can be done while he is here to see me.  He needs to wear his hearing aids when he comes to the appt.

## 2018-01-07 NOTE — Telephone Encounter (Signed)
[name] the patient's wife called requesting lab orders to take to an outside before office visit on this coming Thursday. According to [name], the patient can not hear very well and would like you to speak with her. Routing to Dr. Mariea Clonts. YW

## 2018-01-08 ENCOUNTER — Other Ambulatory Visit: Payer: PPO

## 2018-01-08 ENCOUNTER — Other Ambulatory Visit: Payer: Self-pay

## 2018-01-08 DIAGNOSIS — Z5181 Encounter for therapeutic drug level monitoring: Secondary | ICD-10-CM | POA: Diagnosis not present

## 2018-01-08 DIAGNOSIS — G894 Chronic pain syndrome: Secondary | ICD-10-CM

## 2018-01-08 DIAGNOSIS — E7800 Pure hypercholesterolemia, unspecified: Secondary | ICD-10-CM

## 2018-01-08 DIAGNOSIS — E1142 Type 2 diabetes mellitus with diabetic polyneuropathy: Secondary | ICD-10-CM | POA: Diagnosis not present

## 2018-01-08 NOTE — Telephone Encounter (Signed)
.  left message to have patient return my call.  

## 2018-01-09 ENCOUNTER — Ambulatory Visit (INDEPENDENT_AMBULATORY_CARE_PROVIDER_SITE_OTHER): Payer: PPO | Admitting: Internal Medicine

## 2018-01-09 ENCOUNTER — Encounter: Payer: Self-pay | Admitting: Internal Medicine

## 2018-01-09 ENCOUNTER — Telehealth: Payer: Self-pay | Admitting: *Deleted

## 2018-01-09 VITALS — BP 110/76 | HR 76 | Temp 97.8°F | Ht 70.0 in | Wt 215.2 lb

## 2018-01-09 DIAGNOSIS — I481 Persistent atrial fibrillation: Secondary | ICD-10-CM | POA: Diagnosis not present

## 2018-01-09 DIAGNOSIS — I4819 Other persistent atrial fibrillation: Secondary | ICD-10-CM

## 2018-01-09 DIAGNOSIS — R609 Edema, unspecified: Secondary | ICD-10-CM

## 2018-01-09 DIAGNOSIS — Z720 Tobacco use: Secondary | ICD-10-CM

## 2018-01-09 DIAGNOSIS — E7800 Pure hypercholesterolemia, unspecified: Secondary | ICD-10-CM

## 2018-01-09 DIAGNOSIS — E1142 Type 2 diabetes mellitus with diabetic polyneuropathy: Secondary | ICD-10-CM | POA: Diagnosis not present

## 2018-01-09 DIAGNOSIS — M4820 Kissing spine, site unspecified: Secondary | ICD-10-CM

## 2018-01-09 DIAGNOSIS — M1611 Unilateral primary osteoarthritis, right hip: Secondary | ICD-10-CM | POA: Diagnosis not present

## 2018-01-09 DIAGNOSIS — G894 Chronic pain syndrome: Secondary | ICD-10-CM | POA: Diagnosis not present

## 2018-01-09 MED ORDER — SULFAMETHOXAZOLE-TRIMETHOPRIM 800-160 MG PO TABS: 1.0000 | ORAL_TABLET | Freq: Two times a day (BID) | ORAL | 3 refills | Status: DC

## 2018-01-09 MED ORDER — FUROSEMIDE 40 MG PO TABS: ORAL_TABLET | ORAL | 3 refills | Status: DC

## 2018-01-09 MED ORDER — CROMOLYN SODIUM 4 % OP SOLN: 1.0000 [drp] | Freq: Four times a day (QID) | OPHTHALMIC | 5 refills | Status: DC

## 2018-01-09 MED ORDER — MORPHINE SULFATE 30 MG PO TABS: ORAL_TABLET | ORAL | 0 refills | Status: DC

## 2018-01-09 MED FILL — FUROSEMIDE 40 MG TAB: 40 | 30 days supply | Qty: 60 | Fill #0

## 2018-01-09 MED FILL — SULFAMETHOXAZOLE-TMP DS TAB: 800-160 | 30 days supply | Qty: 60 | Fill #0

## 2018-01-09 NOTE — Patient Instructions (Signed)
I will refer you back to Dr. Debara Pickett to see you before you can get orthopedic surgery. I will send you to Dr. Ninfa Linden at [facility] to for a possible hip replacement on the right.   Continue your current pain regimen.  Hopefully, after your surgery, we'll be able to gradually taper your morphine. Be sure to drink plenty of water (6-8 8oz glasses of water daily) and continue to avoid sodas, sweets, and salty foods. Your lab results have been improving with the changes you've made which is great!

## 2018-01-09 NOTE — Telephone Encounter (Signed)
[place] called and left message on Clinical Intake and stated that the Rx Cromolyn (Opticrom) 4% you wrote for today is not available. Need something different faxed over. Please Advise.

## 2018-01-09 NOTE — Telephone Encounter (Signed)
Pharmacist notified and stated that they will have patient refer to his eye dr.

## 2018-01-09 NOTE — Telephone Encounter (Signed)
No idea.  Normally we are not filling eye drops. Would say he needs to check with his eye doctor for a replacement.

## 2018-01-09 NOTE — Progress Notes (Signed)
Location:  [facility] clinic Provider:  Ormand Senn L. Mariea Clonts, D.O., C.M.D.  Code Status: full code; no acp documents on file--does not want prolonged intubation Goals of Care:  Advanced Directives 11/20/2017  Does Patient Have a Medical Advance Directive? No  Would patient like information on creating a medical advance directive? Yes (MAU/Ambulatory/Procedural Areas - Information given)   Chief Complaint  Patient presents with  . Medical Management of Chronic Issues    Wants to see an orthapedic doc for his right leg. Hip and back also hurting him.   . Medication Refill    Refills pended and eliquis (samples) needed    HPI: Patient is a 82 y.o. male seen today for medical management of chronic diseases.    Right leg is killing him.  He is using crutches b/c he can't put weight on it.   They were meant to call me back with which orthopedic surgeon he wants to see.  He's been on morphine long-term since his prior physician.  Pain is to where it even bothers him in his groin at rest now.  His xrays of his lumbar spine and bilateral hips showed advanced osteoarthritis of his right hip and diffuse idiopathic skeletal hyperostosis or spondylosis deformans, prominent anterior osteophytes, no disc space narrowing.    No difficulty with his stomach lately.  He has difficulty with his swallowing occasionally, but refused an egd to evaluate what was going on.    He does continue to get some swelling of his ankles.  He has cut down on his sugar.  He was drinking 3-4 coca colas, but he's quit them.  He's avoiding adding salt.  Lasix does help him urinate quite a bit.  He says he goes every 15 mins.    He has afib.  He's on eliquis therapy.  He had a cardioversion, but it was not successful.    Hyperlipidemia:  LDL now 83 down from 89.    Hyperglycemia:  Hba1c  6.4.    CKD:  Stable with GFR of 49.    Anemia:  Mild.  H/h 12.3/36.7.  Was worked up in the past.  Had been on a lot of prednisone at one time with  Dr. Alen Blew.    Had prostatectomy and reports that a hole was put through his bowel and he had a temporary colostomy that was then taken down.  This was in 1991.  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  Discussed that this may be affecting his kidney function and in turn causing anemia over time.  I would like him to have this followed up on again, but all he wants to do is get his hip taken care of right now.  He continues to chew loose leaf tobacco.  He's done this for 50 years.  He says he's going to quit b/c of benefits of quitting nicotine.    Office Visit from 01/09/2018 in [place]  AUDIT-C Score  0      Past Medical History:  Diagnosis Date  . Anxiety state, unspecified   . Atrial fibrillation ([place]) 03/06/2016  . Chronic pain syndrome   . Constipation   . Depressive disorder, not elsewhere classified   . Dysphagia, unspecified(787.20)   . Herpes simplex disease   . Hyperlipidemia   . Hypertension   . Hypopotassemia   . Insomnia, unspecified   . Intestinovesical fistula   . Malignant neoplasm of prostate ([place])   . Myalgia and myositis, unspecified   .  Osteoarthritis   . Pain in joint, lower leg    Bilateral knee pains  . Reflux esophagitis   . Rheumatoid arthritis with rheumatoid factor (HCC)   . Spinal stenosis, unspecified region other than cervical   . Thrombocytopenia, unspecified ([place])   . Type II or unspecified type diabetes mellitus without mention of complication, not stated as uncontrolled   . Unspecified arthropathy, pelvic region and thigh   . Unspecified hereditary and idiopathic peripheral neuropathy     Past Surgical History:  Procedure Laterality Date  . CARDIAC CATHETERIZATION  01/31/2004   Dr Gwenlyn Found  . CARDIOVERSION N/A 07/12/2016   Procedure: CARDIOVERSION;  Surgeon: Pixie Casino, MD;  Location: [facility];  Service: Cardiovascular;  Laterality: N/A;  . COLONOSCOPY  08/09/2006   Dr Christian Mate, hemorrhoids/rectal fistula  .  cystocopy  07/2006   Dr Harlow Asa  . NASAL SINUS SURGERY  1992  . PROSTATE SURGERY  1991    Allergies  Allergen Reactions  . Indocin [Indomethacin]   . Methadone     Outpatient Encounter Medications as of 01/09/2018  Medication Sig  . apixaban (ELIQUIS) 5 MG TABS tablet TAKE 1 TABLET BY MOUTH TWICE DAILY TO HELP PREVENT STROKE RELATED TO ATRIAL FIBRILLATION  . cromolyn (OPTICROM) 4 % ophthalmic solution Place 1 drop into both eyes 4 (four) times daily.  . furosemide (LASIX) 40 MG tablet Take one tablet by mouth twice daily to prevent fluid retention  . morphine (MSIR) 30 MG tablet Take two tablets by mouth every 12 hours as needed for pain  . sulfamethoxazole-trimethoprim (BACTRIM DS,SEPTRA DS) 800-160 MG tablet Take 1 tablet by mouth 2 (two) times daily. To control infection  . [DISCONTINUED] cromolyn (OPTICROM) 4 % ophthalmic solution Place 1 drop into both eyes 4 (four) times daily.  . [DISCONTINUED] furosemide (LASIX) 40 MG tablet Take one tablet by mouth twice daily to prevent fluid retention  . [DISCONTINUED] morphine (MSIR) 30 MG tablet Take two tablets by mouth every 12 hours as needed for pain  . [DISCONTINUED] sulfamethoxazole-trimethoprim (BACTRIM DS,SEPTRA DS) 800-160 MG tablet Take 1 tablet by mouth 2 (two) times daily. To control infection   No facility-administered encounter medications on file as of 01/09/2018.     Review of Systems:  Review of Systems  Constitutional: Negative for chills and fever.       Sweats a lot  HENT: Positive for hearing loss. Negative for congestion.        Brought his hearing aid which was not helpful and his bluetooth assistive hearing device which worked much better for his left ear  Eyes: Negative for blurred vision.  Respiratory: Negative for cough and shortness of breath.   Cardiovascular: Positive for leg swelling. Negative for chest pain and palpitations.       Edema has improved, afib  Gastrointestinal: Negative for abdominal pain,  blood in stool, constipation, diarrhea and melena.  Genitourinary: Negative for dysuria.  Musculoskeletal: Positive for back pain and joint pain. Negative for falls, myalgias and neck pain.  Skin: Negative for itching and rash.  Neurological: Negative for dizziness and loss of consciousness.  Endo/Heme/Allergies: Bruises/bleeds easily.  Psychiatric/Behavioral: Negative for depression and memory loss.    Health Maintenance  Topic Date Due  . FOOT EXAM  12/22/2015  . URINE MICROALBUMIN  11/15/2017  . TETANUS/TDAP  11/01/2024 (Originally 08/02/1954)  . OPHTHALMOLOGY EXAM  01/15/2018  . INFLUENZA VACCINE  02/20/2018  . HEMOGLOBIN A1C  03/12/2018  . PNA vac Low Risk Adult  Completed  Physical Exam: Vitals:   01/09/18 0836  BP: 110/76  Pulse: 76  Temp: 97.8 F (36.6 C)  SpO2: 95%  Weight: 215 lb 3.2 oz (97.6 kg)  Height: 5\' 10"  (1.778 m)   Body mass index is 30.88 kg/m. Physical Exam  Constitutional: He is oriented to person, place, and time. He appears well-developed and well-nourished.  Cardiovascular: Intact distal pulses.  irreg irreg  Pulmonary/Chest: Effort normal and breath sounds normal. No respiratory distress. He has no rales.  Abdominal: Soft. Bowel sounds are normal. He exhibits no distension and no mass. There is no tenderness. There is no rebound and no guarding.  Musculoskeletal: Normal range of motion. He exhibits tenderness.  Right lateral hip and groin  Neurological: He is alert and oriented to person, place, and time. No cranial nerve deficit.  Skin: Skin is warm and dry. Capillary refill takes less than 2 seconds.  Psychiatric: He has a normal mood and affect.    Labs reviewed: Basic Metabolic Panel: Recent Labs    04/15/17 0908 09/12/17 1015 01/08/18 1110  NA 140 142 138  K 3.8 3.7 4.0  CL 103 104 101  CO2 30 31 28   GLUCOSE 120* 121* 105*  BUN 20 16 16   CREATININE 1.34* 1.11 1.36*  CALCIUM 8.4* 8.8 8.6   Liver Function Tests: Recent Labs      09/12/17 1015 01/08/18 1110  AST 18 19  ALT 12 11  BILITOT 0.9 1.0  PROT 6.8 6.7   No results for input(s): LIPASE, AMYLASE in the last 8760 hours. No results for input(s): AMMONIA in the last 8760 hours. CBC: Recent Labs    01/10/17 09/12/17 1015 01/08/18 1110  WBC 10.4 6.1 7.3  NEUTROABS 6 3,709 4,672  HGB 13.8 12.8* 12.3*  HCT 42 36.9* 36.7*  MCV  --  89.1 89.7  PLT 189 143 182   Lipid Panel: Recent Labs    04/15/17 0908 09/12/17 1015 01/08/18 1110  CHOL 147 144 135  HDL 31* 35* 36*  LDLCALC 92 89 83  TRIG 138 102 75  CHOLHDL 4.7 4.1 3.8   Lab Results  Component Value Date   HGBA1C 6.4 (H) 01/08/2018    Procedures since last visit: No results found.  Assessment/Plan 1. Primary osteoarthritis of right hip - is advanced per xray images - AMB referral to orthopedics Dr. Ninfa Linden for possible hip replacement--will need cardiac clearance due to his recent edema and lasix needs, persistent afib so also referred to Dr. Debara Pickett who saw him before  2. Chronic pain syndrome - has been on morphine for years with prior PCP, will plan to try to taper after his hip is taken care of -also may have some DISH but seems more localized - morphine (MSIR) 30 MG tablet; Take two tablets by mouth every 12 hours as needed for pain  Dispense: 120 tablet; Refill: 0 - AMB referral to orthopedics  3. Edema, unspecified type - ongoing, last stress echo on file in 07/24/16 with no ST deviation and low risk study, small mild fixed apical perfusion defect attenuation vs prior infarct and no signif ischemia - furosemide (LASIX) 40 MG tablet; Take one tablet by mouth twice daily to prevent fluid retention  Dispense: 60 tablet; Refill: 3  4. DM type 2 with diabetic peripheral neuropathy ([place]) - well controlled now - I've been counseling him on his dietary choices and he has reduced soda and sweets with some improvement -needs foot exam  Next visit - CBC with Differential/Platelet;  Future - COMPLETE METABOLIC PANEL WITH GFR; Future - Hemoglobin A1c; Future - Microalbumin / creatinine urine ratio; Future  5. Pure hypercholesterolemia -lipid improved from last visit, cont lipitor--chart does not show known CAD - Lipid panel; Future  6. Persistent atrial fibrillation (HCC) - ongoing, but not followed regularly by cardiology and not seen in a long time--no recent notes - Ambulatory referral to Cardiology for preop clearance for right hip surgery - CBC with Differential/Platelet; Future  7. Localized idiopathic skeletal hyperostosis -of lumbar spine on imaging -is on chronic morphine and has CKD making nsaids not a good option for him -will see how he's doing with this after his hip is taken care of as he cannot even bear weight now  8. Tobacco abuse -ongoing chewing, encouraged him to quit and he was reading the smoking cessation poster--we reviewed cancer risks of chewing and he understands and says he's going to work on quitting this  Labs/tests ordered:   Orders Placed This Encounter  Procedures  . CBC with Differential/Platelet    Standing Status:   Future    Standing Expiration Date:   09/11/2018  . COMPLETE METABOLIC PANEL WITH GFR    Standing Status:   Future    Standing Expiration Date:   09/11/2018  . Hemoglobin A1c    Standing Status:   Future    Standing Expiration Date:   09/11/2018  . Lipid panel    Standing Status:   Future    Standing Expiration Date:   09/11/2018  . Microalbumin / creatinine urine ratio    Standing Status:   Future    Standing Expiration Date:   09/11/2018  . AMB referral to orthopedics    Referral Priority:   Routine    Referral Type:   Consultation    Number of Visits Requested:   1  . Ambulatory referral to Cardiology    Referral Priority:   Routine    Referral Type:   Consultation    Referral Reason:   Specialty Services Required    Requested Specialty:   Cardiology    Number of Visits Requested:   1   Next appt:   05/12/2018 med mgt, labs before    [place]. Marquese Burkland, D.O. [facility] [address], [place] [address] Cell Phone (Mon-Fri 8am-5pm):  [phone] On Call:  [phone] & follow prompts after 5pm & weekends Office Phone:  [phone] Office Fax:  [phone]

## 2018-01-10 LAB — DRUG TOX MONITOR 1 W/CONF, ORAL FLD
Amphetamines: NEGATIVE ng/mL (ref <10)
Barbiturates: NEGATIVE ng/mL (ref <10)
Benzodiazepines: NEGATIVE ng/mL (ref <0.50)
Buprenorphine: NEGATIVE ng/mL (ref <0.10)
Cocaine: NEGATIVE ng/mL (ref <5.0)
Codeine: NEGATIVE ng/mL (ref <2.5)
Cotinine: >250 ng/mL — ABNORMAL HIGH (ref <5.0)
Dihydrocodeine: NEGATIVE ng/mL (ref <2.5)
Fentanyl: NEGATIVE ng/mL (ref <0.10)
Heroin Metabolite: NEGATIVE ng/mL (ref <1.0)
Hydrocodone: NEGATIVE ng/mL (ref <2.5)
Hydromorphone: NEGATIVE ng/mL (ref <2.5)
MARIJUANA: NEGATIVE ng/mL (ref <2.5)
MDMA: NEGATIVE ng/mL (ref <10)
Meprobamate: NEGATIVE ng/mL (ref <2.5)
Methadone: NEGATIVE ng/mL (ref <5.0)
Morphine: 56.6 ng/mL — ABNORMAL HIGH (ref <2.5)
Nicotine Metabolite: POSITIVE ng/mL — AB (ref <5.0)
Norhydrocodone: NEGATIVE ng/mL (ref <2.5)
Noroxycodone: NEGATIVE ng/mL (ref <2.5)
Opiates: POSITIVE ng/mL — AB (ref <2.5)
Oxycodone: NEGATIVE ng/mL (ref <2.5)
Oxymorphone: NEGATIVE ng/mL (ref <2.5)
Phencyclidine: NEGATIVE ng/mL (ref <10)
Tapentadol: NEGATIVE ng/mL (ref <5.0)
Tramadol: NEGATIVE ng/mL (ref <5.0)
Zolpidem: NEGATIVE ng/mL (ref <5.0)

## 2018-01-15 ENCOUNTER — Ambulatory Visit (INDEPENDENT_AMBULATORY_CARE_PROVIDER_SITE_OTHER): Payer: PPO | Admitting: Orthopaedic Surgery

## 2018-01-15 ENCOUNTER — Encounter (INDEPENDENT_AMBULATORY_CARE_PROVIDER_SITE_OTHER): Payer: Self-pay | Admitting: Orthopaedic Surgery

## 2018-01-15 DIAGNOSIS — M1611 Unilateral primary osteoarthritis, right hip: Secondary | ICD-10-CM

## 2018-01-15 NOTE — Progress Notes (Signed)
Office Visit Note   Patient: Ronnie Jackson           Date of Birth: [birth year]           MRN: [id number] Visit Date: 01/15/2018              Requested by: Gayland Curry, DO [place], [place] [address] PCP: Gayland Curry, DO   Assessment & Plan: Visit Diagnoses:  1. Unilateral primary osteoarthritis, right hip     Plan: At this point given the severity of his right hip arthritis and severity of his pain, we are recommending a total hip arthroplasty through direct anterior approach of his right hip.  We had a long and thorough discussion about the surgery involves.  I should have hip models and went over his x-rays with him in general.  We had a discussion of the risk and benefits of surgery as well as talk about his intraoperative and postoperative course.  I would have him stop Eliquis 4 days prior to surgery in order to have it safe for spinal anesthesia.  We would then start Eliquis back the next day.  All questions concerns were answered and addressed.  We will work on getting the surgery set up soon barring any problems from his upcoming cardiac visit.  Follow-Up Instructions: Return for 2 weeks post-op.   Orders:  No orders of the defined types were placed in this encounter.  No orders of the defined types were placed in this encounter.     Procedures: No procedures performed   Clinical Data: No additional findings.   Subjective: Chief Complaint  Patient presents with  . Right Hip - Pain  Patient is a very pleasant 82 year old gentleman with severe and debilitating right hip pain.  This is been getting worse for years now.  He is always been reluctant to have any type of surgery but now it has gotten to where his pain is daily and it is 10 out of 10.  He is having to use crutches to get around.  He wakes up with pain and has pain all day long and then goes to sleep with pain.  At this point it is detrimentally affect is active daily living, quality of  life, his mobility.  He says he is put up with it for so long but now it is time to have a hip replacement.  He has x-rays on the canopy system confirming severe end-stage arthritis of his right hip.  He does have a history of atrial fibrillation and is seeing his cardiologist soon for clearance.  He is on Eliquis.  He would need to stop Eliquis 4 days prior to surgery in order to have spinal anesthesia.  He has tried and failed all forms of conservative treatment and he cannot take anti-inflammatories either.  He currently denies any headache, chest pain, shortness of breath, fever, chills, nausea, vomiting.  HPI  Review of Systems He currently denies any active medical issues.  Objective: Vital Signs: There were no vitals taken for this visit.  Physical Exam He is alert and oriented x3 and in no acute distress but obvious discomfort.  He is ambulating with crutches. Ortho Exam Examination of his left hip is normal.  Examination of his right hip shows severe pain with any attempts of internal or external rotation or flexion extension and is very superior. Specialty Comments:  No specialty comments available.  Imaging: No results found. X-rays in the canopy  system independently reviewed show a normal left hip.  His right hip has severe end-stage arthritis.  The joint space is essentially gone.  He has severe sclerotic changes in the femoral head and acetabulum as well as para-articular osteophytes.  PMFS History: Patient Active Problem List   Diagnosis Date Noted  . Unilateral primary osteoarthritis, right hip 01/15/2018  . Hole in colon [facility]) 04/18/2017  . DDD (degenerative disc disease), cervical 04/18/2017  . Hypokalemia 03/20/2016  . Atrial fibrillation ([place]) 03/06/2016  . Paresthesia 02/08/2016  . Lumbar pain 07/05/2015  . Cough 07/05/2015  . Edema 06/21/2015  . Dysphagia, pharyngoesophageal phase 06/29/2014  . Indigestion 02/24/2014  . Pain in joint, shoulder region  09/15/2013  . Pain, knee 09/15/2013  . Obesity (BMI 30-39.9) 09/15/2013  . Pain in joint, pelvic region and thigh 09/15/2013  . Osteoarthritis   . Rheumatoid arthritis with rheumatoid factor (HCC)   . Malignant neoplasm of prostate ([place])   . Insomnia, unspecified   . Chronic pain syndrome   . Hyperlipemia   . DM type 2 with diabetic peripheral neuropathy [facility])    Past Medical History:  Diagnosis Date  . Anxiety state, unspecified   . Atrial fibrillation ([place]) 03/06/2016  . Chronic pain syndrome   . Constipation   . Depressive disorder, not elsewhere classified   . Dysphagia, unspecified(787.20)   . Herpes simplex disease   . Hyperlipidemia   . Hypertension   . Hypopotassemia   . Insomnia, unspecified   . Intestinovesical fistula   . Malignant neoplasm of prostate ([place])   . Myalgia and myositis, unspecified   . Osteoarthritis   . Pain in joint, lower leg    Bilateral knee pains  . Reflux esophagitis   . Rheumatoid arthritis with rheumatoid factor (HCC)   . Spinal stenosis, unspecified region other than cervical   . Thrombocytopenia, unspecified ([place])   . Type II or unspecified type diabetes mellitus without mention of complication, not stated as uncontrolled   . Unspecified arthropathy, pelvic region and thigh   . Unspecified hereditary and idiopathic peripheral neuropathy     Family History  Problem Relation Age of Onset  . Prostate cancer Father   . Heart failure Mother   . Lung cancer Daughter   . Arthritis Sister     Past Surgical History:  Procedure Laterality Date  . CARDIAC CATHETERIZATION  01/31/2004   Dr Gwenlyn Found  . CARDIOVERSION N/A 07/12/2016   Procedure: CARDIOVERSION;  Surgeon: Pixie Casino, MD;  Location: [place];  Service: Cardiovascular;  Laterality: N/A;  . COLONOSCOPY  08/09/2006   Dr Christian Mate, hemorrhoids/rectal fistula  . cystocopy  07/2006   Dr Harlow Asa  . NASAL SINUS SURGERY  1992  . [place]   Social History    Occupational History  . Not on file  Tobacco Use  . Smoking status: Never Smoker  . Smokeless tobacco: Current User    Types: Chew  . Tobacco comment: Chews tobacco daily   Substance and Sexual Activity  . Alcohol use: No  . Drug use: No  . Sexual activity: Not Currently

## 2018-01-21 ENCOUNTER — Ambulatory Visit: Payer: PPO | Admitting: Cardiology

## 2018-01-24 ENCOUNTER — Other Ambulatory Visit: Payer: Self-pay | Admitting: *Deleted

## 2018-01-24 DIAGNOSIS — G894 Chronic pain syndrome: Secondary | ICD-10-CM

## 2018-01-24 MED FILL — MORPHINE SULFATE IR 30 MG T: 30 | 30 days supply | Qty: 120 | Fill #0

## 2018-01-24 NOTE — Telephone Encounter (Signed)
Ok to refill? NCCSRS shows last filled 12/25/2017 #120 at [facility] outpatient.

## 2018-01-24 NOTE — Telephone Encounter (Signed)
Patient's wife advised and said he just picked up the Rx from 01/09/18 today. She wasn't aware.

## 2018-01-25 ENCOUNTER — Other Ambulatory Visit (INDEPENDENT_AMBULATORY_CARE_PROVIDER_SITE_OTHER): Payer: Self-pay | Admitting: Physician Assistant

## 2018-01-29 ENCOUNTER — Other Ambulatory Visit (INDEPENDENT_AMBULATORY_CARE_PROVIDER_SITE_OTHER): Payer: Self-pay

## 2018-01-30 NOTE — Pre-Procedure Instructions (Signed)
Ronnie Jackson  01/30/2018      [place], [place] - [address] [place] [address] Phone: [phone] Fax: [phone]  [id number] - [address], [address] [place] [address] Phone: [phone] Fax: [phone]    Your procedure is scheduled on July 16th.  Report to [facility] Admitting at 12:15 P.M.  Call this number if you have problems the morning of surgery:  [phone]   Remember:  Do not eat or drink after midnight.  Continue all medications as directed by your physician except follow these medication instructions before surgery below  STOP taking your ELIQUIS four days prior to your surgery. LAST DOSE should be Friday, July 12th.   7 days prior to surgery STOP taking any Aspirin(unless otherwise instructed by your surgeon), Aleve, Naproxen, Ibuprofen, Motrin, Advil, Goody's, BC's, all herbal medications, fish oil, and all vitamins   Take these medicines the morning of surgery with A SIP OF WATER  ketotifen (ZADITOR) 0.025 % ophthalmic solution morphine (MSIR) 30 MG tablet-as needed for pain.    WHAT DO I DO ABOUT MY DIABETES MEDICATION?   Marland Kitchen Do not take oral diabetes medicines (pills) the morning of surgery.    . The day of surgery, do not take other diabetes injectables, including Byetta (exenatide), Bydureon (exenatide ER), Victoza (liraglutide), or Trulicity (dulaglutide).  . If your CBG is greater than 220 mg/dL, you may take  of your sliding scale (correction) dose of insulin.   How to Manage Your Diabetes Before and After Surgery  Why is it important to control my blood sugar before and after surgery? . Improving blood sugar levels before and after surgery helps healing and can limit problems. . A way of improving blood sugar control is eating a healthy diet by: o  Eating less sugar and carbohydrates o  Increasing  activity/exercise o  Talking with your doctor about reaching your blood sugar goals . High blood sugars (greater than 180 mg/dL) can raise your risk of infections and slow your recovery, so you will need to focus on controlling your diabetes during the weeks before surgery. . Make sure that the doctor who takes care of your diabetes knows about your planned surgery including the date and location.  How do I manage my blood sugar before surgery? . Check your blood sugar at least 4 times a day, starting 2 days before surgery, to make sure that the level is not too high or low. o Check your blood sugar the morning of your surgery when you wake up and every 2 hours until you get to the Short Stay unit. . If your blood sugar is less than 70 mg/dL, you will need to treat for low blood sugar: o Do not take insulin. o Treat a low blood sugar (less than 70 mg/dL) with  cup of clear juice (cranberry or apple), 4 glucose tablets, OR glucose gel. o Recheck blood sugar in 15 minutes after treatment (to make sure it is greater than 70 mg/dL). If your blood sugar is not greater than 70 mg/dL on recheck, call [phone] for further instructions. . Report your blood sugar to the short stay nurse when you get to Short Stay.  . If you are admitted to the hospital after surgery: o Your blood sugar will be checked by the staff and you will probably be given insulin after surgery (instead of oral diabetes medicines)  to make sure you have good blood sugar levels. o The goal for blood sugar control after surgery is 80-180 mg/dL.     Do not wear jewelry.  Do not wear lotions, powders, or colognes, or deodorant.  Men may shave face and neck.  Do not bring valuables to the hospital.  [facility] is not responsible for any belongings or valuables.  Hearing aids, eyeglasses, Contacts, dentures or bridgework may not be worn into surgery.  Leave your suitcase in the car.  After surgery it may be brought to your  room.  For patients admitted to the hospital, discharge time will be determined by your treatment team.  Patients discharged the day of surgery will not be allowed to drive home.    [facility]- Preparing For Surgery  Before surgery, you can play an important role. Because skin is not sterile, your skin needs to be as free of germs as possible. You can reduce the number of germs on your skin by washing with CHG (chlorahexidine gluconate) Soap before surgery.  CHG is an antiseptic cleaner which kills germs and bonds with the skin to continue killing germs even after washing.    Oral Hygiene is also important to reduce your risk of infection.  Remember - BRUSH YOUR TEETH THE MORNING OF SURGERY WITH YOUR REGULAR TOOTHPASTE  Please do not use if you have an allergy to CHG or antibacterial soaps. If your skin becomes reddened/irritated stop using the CHG.  Do not shave (including legs and underarms) for at least 48 hours prior to first CHG shower. It is OK to shave your face.  Please follow these instructions carefully.   1. Shower the NIGHT BEFORE SURGERY and the MORNING OF SURGERY with CHG.   2. If you chose to wash your hair, wash your hair first as usual with your normal shampoo.  3. After you shampoo, rinse your hair and body thoroughly to remove the shampoo.  4. Use CHG as you would any other liquid soap. You can apply CHG directly to the skin and wash gently with a scrungie or a clean washcloth.   5. Apply the CHG Soap to your body ONLY FROM THE NECK DOWN.  Do not use on open wounds or open sores. Avoid contact with your eyes, ears, mouth and genitals (private parts). Wash Face and genitals (private parts)  with your normal soap.  6. Wash thoroughly, paying special attention to the area where your surgery will be performed.  7. Thoroughly rinse your body with warm water from the neck down.  8. DO NOT shower/wash with your normal soap after using and rinsing off the CHG  Soap.  9. Pat yourself dry with a CLEAN TOWEL.  10. Wear CLEAN PAJAMAS to bed the night before surgery, wear comfortable clothes the morning of surgery  11. Place CLEAN SHEETS on your bed the night of your first shower and DO NOT SLEEP WITH PETS.    Day of Surgery:  Do not apply any deodorants/lotions.  Please wear clean clothes to the hospital/surgery center.   Remember to brush your teeth WITH YOUR REGULAR TOOTHPASTE.   Please read over the following fact sheets that you were given.

## 2018-01-31 ENCOUNTER — Telehealth (INDEPENDENT_AMBULATORY_CARE_PROVIDER_SITE_OTHER): Payer: Self-pay | Admitting: Orthopaedic Surgery

## 2018-01-31 ENCOUNTER — Encounter (HOSPITAL_COMMUNITY)
Admission: RE | Admit: 2018-01-31 | Discharge: 2018-01-31 | Disposition: A | Discharge status: Home / Self Care | Payer: PPO | Source: Ambulatory Visit | Attending: Orthopaedic Surgery | Admitting: Orthopaedic Surgery

## 2018-01-31 ENCOUNTER — Encounter (HOSPITAL_COMMUNITY): Payer: Self-pay | Admitting: Emergency Medicine

## 2018-01-31 DIAGNOSIS — I4891 Unspecified atrial fibrillation: Secondary | ICD-10-CM | POA: Diagnosis not present

## 2018-01-31 DIAGNOSIS — Z01818 Encounter for other preprocedural examination: Secondary | ICD-10-CM | POA: Insufficient documentation

## 2018-01-31 DIAGNOSIS — Z01810 Encounter for preprocedural cardiovascular examination: Secondary | ICD-10-CM | POA: Diagnosis not present

## 2018-01-31 DIAGNOSIS — Z7901 Long term (current) use of anticoagulants: Secondary | ICD-10-CM | POA: Insufficient documentation

## 2018-01-31 DIAGNOSIS — M1611 Unilateral primary osteoarthritis, right hip: Secondary | ICD-10-CM | POA: Insufficient documentation

## 2018-01-31 DIAGNOSIS — Z79899 Other long term (current) drug therapy: Secondary | ICD-10-CM | POA: Insufficient documentation

## 2018-01-31 LAB — GLUCOSE, CAPILLARY: Glucose-Capillary: 91 mg/dL (ref 70–99)

## 2018-01-31 NOTE — Telephone Encounter (Signed)
Pt wife came into the office needs clearance for surgery. Pt wife stated that [place] hasn't seen a Cardiologist and he is sched for surgery. Pre admit informed pt and his wife that he needed clearance. Please call pt to discuss further care.

## 2018-01-31 NOTE — Progress Notes (Signed)
Cardiac Clearance recommended by Dr. Hollace Kinnier  PCP before surgery.  Pt. Has not seen a cardiologist in several years.  Cardiology appointment was made for patient but he cancelled it, the wife tried to get another appointment but was told they could not see him for a couple of weeks.   Spoke with Willeen Cass NP for anesthesia who contacted Dr. Trevor Mace office.  Surgery is cancelled until the patient can get cardiac clearance.  Wife instructed to call Cardiology office and ask for first available appointment. Once cardiac clearance completed call Dr. Trevor Mace office to re-schedule surgery.

## 2018-02-03 NOTE — Telephone Encounter (Signed)
Will you please speak with him or his wife about this?

## 2018-02-04 ENCOUNTER — Encounter (HOSPITAL_COMMUNITY): Admission: RE | Payer: Self-pay | Source: Ambulatory Visit

## 2018-02-04 ENCOUNTER — Inpatient Hospital Stay (HOSPITAL_COMMUNITY): Admission: RE | Admit: 2018-02-04 | Payer: PPO | Source: Ambulatory Visit | Admitting: Orthopaedic Surgery

## 2018-02-04 SURGERY — ARTHROPLASTY, HIP, TOTAL, ANTERIOR APPROACH
Anesthesia: Choice | Laterality: Right

## 2018-02-07 NOTE — Telephone Encounter (Signed)
I called and left voice mail to go to cardiology appointment and once cleared to call me to reschedule surgery.

## 2018-02-10 ENCOUNTER — Telehealth: Payer: Self-pay | Admitting: Cardiology

## 2018-02-10 NOTE — Telephone Encounter (Signed)
Pt needs hip replacement, but couldn't get in to see Dr. Curt Bears until 8/20.  Wife asking if there was anything sooner as pt is in pain and would like to have done ASAP. Informed that I would look at the scheduled and see what I could arrange.  She understands I will call her back by tomorrow.

## 2018-02-10 NOTE — Telephone Encounter (Signed)
New Message:      Pt is calling to speak with the RN.

## 2018-02-11 ENCOUNTER — Telehealth: Payer: Self-pay

## 2018-02-11 NOTE — Telephone Encounter (Signed)
Wife aware scheduler will call her today to arrange OV tomorrow w/ Camnitz. Also called Dr. Trevor Mace office and asked them to fax clearance request to our office.

## 2018-02-11 NOTE — Telephone Encounter (Signed)
    [facility] Pre-operative Risk Assessment    Request for surgical clearance:  1. What type of surgery is being performed? Right total hip arthroplasty   2. When is this surgery scheduled? pending  3. What type of clearance is required (medical clearance vs. Pharmacy clearance to hold med vs. Both)? both  4. Are there any medications that need to be held prior to surgery and how long? Not listed; eliquis  5. Practice name and name of physician performing surgery? [facility], Zollie Beckers  6. What is your office phone number? [phone]    7.   What is your office fax number? [phone] attn Sherrie  8.   Anesthesia type (None, local, MAC, general) ? Not listed   Joaquim Lai 02/11/2018, 9:35 AM  _________________________________________________________________   (provider comments below)

## 2018-02-12 ENCOUNTER — Encounter (INDEPENDENT_AMBULATORY_CARE_PROVIDER_SITE_OTHER): Payer: Self-pay

## 2018-02-12 ENCOUNTER — Encounter: Payer: Self-pay | Admitting: Cardiology

## 2018-02-12 ENCOUNTER — Ambulatory Visit: Payer: PPO | Admitting: Cardiology

## 2018-02-12 VITALS — BP 134/82 | HR 72 | Ht 70.0 in | Wt 214.6 lb

## 2018-02-12 DIAGNOSIS — Z01810 Encounter for preprocedural cardiovascular examination: Secondary | ICD-10-CM

## 2018-02-12 DIAGNOSIS — I481 Persistent atrial fibrillation: Secondary | ICD-10-CM

## 2018-02-12 DIAGNOSIS — I10 Essential (primary) hypertension: Secondary | ICD-10-CM | POA: Diagnosis not present

## 2018-02-12 DIAGNOSIS — I4819 Other persistent atrial fibrillation: Secondary | ICD-10-CM

## 2018-02-12 NOTE — Telephone Encounter (Signed)
   Primary [place], MD  Chart reviewed as part of pre-operative protocol coverage.  Dr. Curt Bears has made the following recommendations: "Intermediate Risk for intermediate risk procedure.  It is ok to hold Eliquis.  The Eliquis should be resumed post op as soon as possible."  I will fwd to [facility] for recommendations on how long to hold Eliquis.  Richardson Dopp, PA-C  02/12/2018, 3:15 PM

## 2018-02-12 NOTE — Patient Instructions (Signed)
Medication Instructions:  Your physician recommends that you continue on your current medications as directed. Please refer to the Current Medication list given to you today.  * If you need a refill on your cardiac medications before your next appointment, please call your pharmacy.   Labwork: None ordered  Testing/Procedures: None ordered  Follow-Up: Your physician recommends that you schedule a follow-up appointment in: 3 months with Dr. Camnitz.  *Please note that any paperwork needing to be filled out by the provider will need to be addressed at the front desk prior to seeing the provider. Please note that any FMLA, disability or other documents regarding health condition is subject to a $25.00 charge that must be received prior to completion of paperwork in the form of a money order or check.  Thank you for choosing [facility]!!   Rafael Salway, RN [phone]       

## 2018-02-12 NOTE — Telephone Encounter (Signed)
Pt takes Eliquis for afib with CHADS2VASc score of 4 (age x2, DM, HTN). CrCl is 42mL/min. Recommend holding Eliquis for 3 days prior to procedure per protocol since spinal sedation is typically used for THA.

## 2018-02-12 NOTE — Progress Notes (Signed)
Electrophysiology Office Note   Date:  02/12/2018   ID:  Ronnie Jackson, DOB [birth year], MRN [id number]  PCP:  Gayland Curry, DO  Primary Electrophysiologist:  Constance Haw, MD    Chief Complaint  Patient presents with  . Persistent Afib     History of Present Illness: Ronnie Jackson is a 82 y.o. male who presents today for electrophysiology evaluation.   Has a history of AF with cardioversion 07/12/16. He is back in atrial fibrillation. He says that he feels well without major complaint. He does not have any chest pain, shortness of breath, or fatigue.   Today, denies symptoms of palpitations, chest pain, shortness of breath, orthopnea, PND, lower extremity edema, claudication, dizziness, presyncope, syncope, bleeding, or neurologic sequela. The patient is tolerating medications without difficulties.  Overall he is feeling well.  He does have some that can likely be attributed to his atrial fibrillation.  His main complaint today is right hip pain.  He is planned for right hip surgery.   Past Medical History:  Diagnosis Date  . Anxiety state, unspecified   . Atrial fibrillation ([place]) 03/06/2016  . Chronic pain syndrome   . Constipation   . Depressive disorder, not elsewhere classified   . Dysphagia, unspecified(787.20)   . Herpes simplex disease   . Hyperlipidemia   . Hypertension   . Hypopotassemia   . Insomnia, unspecified   . Intestinovesical fistula   . Malignant neoplasm of prostate ([place])   . Myalgia and myositis, unspecified   . Osteoarthritis   . Pain in joint, lower leg    Bilateral knee pains  . Reflux esophagitis   . Rheumatoid arthritis with rheumatoid factor (HCC)   . Spinal stenosis, unspecified region other than cervical   . Thrombocytopenia, unspecified ([place])   . Type II or unspecified type diabetes mellitus without mention of complication, not stated as uncontrolled   . Unspecified arthropathy, pelvic region and thigh   . Unspecified  hereditary and idiopathic peripheral neuropathy    Past Surgical History:  Procedure Laterality Date  . CARDIAC CATHETERIZATION  01/31/2004   Dr Gwenlyn Found  . CARDIOVERSION N/A 07/12/2016   Procedure: CARDIOVERSION;  Surgeon: Pixie Casino, MD;  Location: [facility];  Service: Cardiovascular;  Laterality: N/A;  . COLONOSCOPY  08/09/2006   Dr Christian Mate, hemorrhoids/rectal fistula  . cystocopy  07/2006   Dr Harlow Asa  . NASAL SINUS SURGERY  1992  . PROSTATE SURGERY  1991     Current Outpatient Medications  Medication Sig Dispense Refill  . apixaban (ELIQUIS) 5 MG TABS tablet TAKE 1 TABLET BY MOUTH TWICE DAILY TO HELP PREVENT STROKE RELATED TO ATRIAL FIBRILLATION 60 tablet 11  . cromolyn (OPTICROM) 4 % ophthalmic solution Place 1 drop into both eyes 4 (four) times daily. 10 mL 5  . furosemide (LASIX) 40 MG tablet Take 40 mg by mouth 2 (two) times daily.    Marland Kitchen ketotifen (ZADITOR) 0.025 % ophthalmic solution Place 1 drop into both eyes 4 (four) times daily. ALAWAY    . morphine (MSIR) 30 MG tablet Take two tablets by mouth every 12 hours as needed for pain 120 tablet 0  . sulfamethoxazole-trimethoprim (BACTRIM DS,SEPTRA DS) 800-160 MG tablet Take 1 tablet by mouth 2 (two) times daily. To control infection (Patient taking differently: Take 1 tablet by mouth daily. To control infection) 60 tablet 3   No current facility-administered medications for this visit.     Allergies:   Indocin [indomethacin] and Methadone  Social History:  The patient  reports that he has never smoked. His smokeless tobacco use includes chew. He reports that he does not drink alcohol or use drugs.   Family History:  The patient's family history includes Arthritis in his sister; Heart failure in his mother; Lung cancer in his daughter; Prostate cancer in his father.    ROS:  Please see the history of present illness.   Otherwise, review of systems is positive for sweats, fatigue, leg swelling, leg pain, palpitations,  constipation, difficulty urinating, joint swelling, balance problems, back pain, muscle pain, dizziness, easy bruising .   All other systems are reviewed and negative.   PHYSICAL EXAM: VS:  BP 134/82   Pulse 72   Ht 5\' 10"  (1.778 m)   Wt 214 lb 9.6 oz (97.3 kg)   SpO2 100%   BMI 30.79 kg/m  , BMI Body mass index is 30.79 kg/m. GEN: Well nourished, well developed, in no acute distress  HEENT: normal  Neck: no JVD, carotid bruits, or masses Cardiac: RRR; no murmurs, rubs, or gallops,no edema  Respiratory:  clear to auscultation bilaterally, normal work of breathing GI: soft, nontender, nondistended, + BS MS: no deformity or atrophy  Skin: warm and dry Neuro:  Strength and sensation are intact Psych: euthymic mood, full affect  EKG:  EKG is not ordered today. Personal review of the ekg ordered 01/31/18 shows AF, IMI (old)   Recent Lab//s: 01/08/2018: ALT 11; BUN 16; Creat 1.36; Hemoglobin 12.3; Platelets 182; Potassium 4.0; Sodium 138    Lipid Panel     Component Value Date/Time   CHOL 135 01/08/2018 1110   CHOL 161 07/01/2015 0944   TRIG 75 01/08/2018 1110   HDL 36 (L) 01/08/2018 1110   HDL 33 (L) 07/01/2015 0944   CHOLHDL 3.8 01/08/2018 1110   VLDL 26 02/06/2016 1227   LDLCALC 83 01/08/2018 1110     Wt Readings from Last 3 Encounters:  02/12/18 214 lb 9.6 oz (97.3 kg)  01/09/18 215 lb 3.2 oz (97.6 kg)  11/20/17 219 lb (99.3 kg)      Other studies Reviewed: Additional studies/ records that were reviewed today include: TTE 04/19/16 - Left ventricle: The cavity size was normal. Wall thickness was   increased in a pattern of mild LVH. Systolic function was   vigorous. The estimated ejection fraction was in the range of 65%   to 70%. Wall motion was normal; there were no regional wall   motion abnormalities. - Aortic valve: Trileaflet; mildly thickened, moderately calcified   leaflets. - Aorta: Ascending aortic diameter: 41 mm (S). - Ascending aorta: The ascending  aorta was mildly dilated. - Left atrium: The atrium was normal in size. Volume/bsa, ES   (1-plane Simpson&'s, A4C): 29.8 ml/m^2.   ASSESSMENT AND PLAN:  1.  Persistent atrial fibrillation: Currently on Eliquis.  He is previously on flecainide, recent echo without pericardial effusion recent echo without pericardial effusion, otherwise normal.  No changes.  This patients CHA2DS2-VASc Score and unadjusted Ischemic Stroke Rate (% per year) is equal to 4.8 % stroke rate/year from a score of 4  Above score calculated as 1 point each if present [CHF, HTN, DM, Vascular=MI/PAD/Aortic Plaque, Age if 65-74, or Male] Above score calculated as 2 points each if present [Age > 75, or Stroke/TIA/TE]  2. Hypertension: Well-controlled today.   3. LE edema: Currently on Lasix.  Continue with current management.  4.  Preoperative evaluation: Plan to have right hip total arthroplasty.  He  Shenika Quint be at intermediate risk for this intermediate risk procedure.  He is currently on Eliquis.  Would stop the Eliquis prior to the procedure per the surgeon's recommendations and restart as soon as possible postop.   Current medicines are reviewed at length with the patient today.   The patient does not have concerns regarding his medicines.  The following changes were made today: none  Labs/ tests ordered today include:  No orders of the defined types were placed in this encounter.    Disposition:   FU with Isaac Dubie 3 months  Signed, Adisynn Suleiman Meredith Leeds, MD  02/12/2018 10:54 AM     [facility] [address] [place] [address] [phone] (office) [phone] (fax)

## 2018-02-13 NOTE — Telephone Encounter (Signed)
   Primary Cardiologist: Will Meredith Leeds, MD  Chart reviewed as part of pre-operative protocol coverage. Given past medical history and time since last visit, based on ACC/AHA guidelines, Ronnie Jackson would be at acceptable risk for the planned procedure without further cardiovascular testing.   Dr. Curt Bears provided cardiac clearance: "Intermediate Risk for intermediate risk procedure.  It is ok to hold Eliquis.  The Eliquis should be resumed post op as soon as possible."   RE eliquis, our pharmacy recommends: Pt takes Eliquis for afib with CHADS2VASc score of 4 (age x2, DM, HTN). CrCl is 29mL/min. Recommend holding Eliquis for 3 days prior to procedure per protocol since spinal sedation is typically used for THA.  I will route this recommendation to the requesting party via Epic fax function and remove from pre-op pool.  Please call with questions.  [place], PA 02/13/2018, 2:06 PM

## 2018-02-18 NOTE — Progress Notes (Signed)
02-12-18 (Epic) Cardiac Clearance per Dr. Baird Kay  02-09-18 (Epic) EKG  07-24-16 (Epic) Stress  04-19-16 (Epic) ECHO

## 2018-02-18 NOTE — Patient Instructions (Addendum)
Ronnie Jackson  02/18/2018   Your procedure is scheduled on: 02-20-18   Report to [facility] Main  Entrance    Report to Admitting at 8:30 AM    Call this number if you have problems the morning of surgery [phone]   Remember: Do not eat food or drink liquids :After Midnight.     Take these medicines the morning of surgery with A SIP OF WATER: Morphine (MSIR) as needed. You may also bring and use your eyedrops as needed.                                You may not have any metal on your body including hair pins and              piercings  Do not wear jewelry, make-up, lotions, powders or perfumes, deodorant              Men may shave face and neck.   Do not bring valuables to the hospital. [place].  Contacts, dentures or bridgework may not be worn into surgery.  Leave suitcase in the car. After surgery it may be brought to your room.    Special Instructions: N/A              Please read over the following fact sheets you were given: _____________________________________________________________________             [facility] - Preparing for Surgery Before surgery, you can play an important role.  Because skin is not sterile, your skin needs to be as free of germs as possible.  You can reduce the number of germs on your skin by washing with CHG (chlorahexidine gluconate) soap before surgery.  CHG is an antiseptic cleaner which kills germs and bonds with the skin to continue killing germs even after washing. Please DO NOT use if you have an allergy to CHG or antibacterial soaps.  If your skin becomes reddened/irritated stop using the CHG and inform your nurse when you arrive at Short Stay. Do not shave (including legs and underarms) for at least 48 hours prior to the first CHG shower.  You may shave your face/neck. Please follow these instructions carefully:  1.  Shower with CHG Soap the night before  surgery and the  morning of Surgery.  2.  If you choose to wash your hair, wash your hair first as usual with your  normal  shampoo.  3.  After you shampoo, rinse your hair and body thoroughly to remove the  shampoo.                           4.  Use CHG as you would any other liquid soap.  You can apply chg directly  to the skin and wash                       Gently with a scrungie or clean washcloth.  5.  Apply the CHG Soap to your body ONLY FROM THE NECK DOWN.   Do not use on face/ open  Wound or open sores. Avoid contact with eyes, ears mouth and genitals (private parts).                       Wash face,  Genitals (private parts) with your normal soap.             6.  Wash thoroughly, paying special attention to the area where your surgery  will be performed.  7.  Thoroughly rinse your body with warm water from the neck down.  8.  DO NOT shower/wash with your normal soap after using and rinsing off  the CHG Soap.                9.  Pat yourself dry with a clean towel.            10.  Wear clean pajamas.            11.  Place clean sheets on your bed the night of your first shower and do not  sleep with pets. Day of Surgery : Do not apply any lotions/deodorants the morning of surgery.  Please wear clean clothes to the hospital/surgery center.  FAILURE TO FOLLOW THESE INSTRUCTIONS MAY RESULT IN THE CANCELLATION OF YOUR SURGERY PATIENT SIGNATURE_________________________________  NURSE SIGNATURE__________________________________  ________________________________________________________________________   Ronnie Jackson  An incentive spirometer is a tool that can help keep your lungs clear and active. This tool measures how well you are filling your lungs with each breath. Taking long deep breaths may help reverse or decrease the chance of developing breathing (pulmonary) problems (especially infection) following:  A long period of time when you are unable to  move or be active. BEFORE THE PROCEDURE   If the spirometer includes an indicator to show your best effort, your nurse or respiratory therapist will set it to a desired goal.  If possible, sit up straight or lean slightly forward. Try not to slouch.  Hold the incentive spirometer in an upright position. INSTRUCTIONS FOR USE  1. Sit on the edge of your bed if possible, or sit up as far as you can in bed or on a chair. 2. Hold the incentive spirometer in an upright position. 3. Breathe out normally. 4. Place the mouthpiece in your mouth and seal your lips tightly around it. 5. Breathe in slowly and as deeply as possible, raising the piston or the ball toward the top of the column. 6. Hold your breath for 3-5 seconds or for as long as possible. Allow the piston or ball to fall to the bottom of the column. 7. Remove the mouthpiece from your mouth and breathe out normally. 8. Rest for a few seconds and repeat Steps 1 through 7 at least 10 times every 1-2 hours when you are awake. Take your time and take a few normal breaths between deep breaths. 9. The spirometer may include an indicator to show your best effort. Use the indicator as a goal to work toward during each repetition. 10. After each set of 10 deep breaths, practice coughing to be sure your lungs are clear. If you have an incision (the cut made at the time of surgery), support your incision when coughing by placing a pillow or rolled up towels firmly against it. Once you are able to get out of bed, walk around indoors and cough well. You may stop using the incentive spirometer when instructed by your caregiver.  RISKS AND COMPLICATIONS  Take your time so you do not get  dizzy or light-headed.  If you are in pain, you may need to take or ask for pain medication before doing incentive spirometry. It is harder to take a deep breath if you are having pain. AFTER USE  Rest and breathe slowly and easily.  It can be helpful to keep track of  a log of your progress. Your caregiver can provide you with a simple table to help with this. If you are using the spirometer at home, follow these instructions: [place] IF:   You are having difficultly using the spirometer.  You have trouble using the spirometer as often as instructed.  Your pain medication is not giving enough relief while using the spirometer.  You develop fever of 100.5 F (38.1 C) or higher. SEEK IMMEDIATE MEDICAL CARE IF:   You cough up bloody sputum that had not been present before.  You develop fever of 102 F (38.9 C) or greater.  You develop worsening pain at or near the incision site. MAKE SURE YOU:   Understand these instructions.  Will watch your condition.  Will get help right away if you are not doing well or get worse. Document Released: 11/19/2006 Document Revised: 10/01/2011 Document Reviewed: 01/20/2007 ExitCare Patient Information 2014 Exit[employer], [place].   ________________________________________________________________________  WHAT IS A BLOOD TRANSFUSION? Blood Transfusion Information  A transfusion is the replacement of blood or some of its parts. Blood is made up of multiple cells which provide different functions.  Red blood cells carry oxygen and are used for blood loss replacement.  White blood cells fight against infection.  Platelets control bleeding.  Plasma helps clot blood.  Other blood products are available for specialized needs, such as hemophilia or other clotting disorders. BEFORE THE TRANSFUSION  Who gives blood for transfusions?   Healthy volunteers who are fully evaluated to make sure their blood is safe. This is blood bank blood. Transfusion therapy is the safest it has ever been in the practice of medicine. Before blood is taken from a donor, a complete history is taken to make sure that person has no history of diseases nor engages in risky social behavior (examples are intravenous drug use or sexual  activity with multiple partners). The donor's travel history is screened to minimize risk of transmitting infections, such as malaria. The donated blood is tested for signs of infectious diseases, such as HIV and hepatitis. The blood is then tested to be sure it is compatible with you in order to minimize the chance of a transfusion reaction. If you or a relative donates blood, this is often done in anticipation of surgery and is not appropriate for emergency situations. It takes many days to process the donated blood. RISKS AND COMPLICATIONS Although transfusion therapy is very safe and saves many lives, the main dangers of transfusion include:   Getting an infectious disease.  Developing a transfusion reaction. This is an allergic reaction to something in the blood you were given. Every precaution is taken to prevent this. The decision to have a blood transfusion has been considered carefully by your caregiver before blood is given. Blood is not given unless the benefits outweigh the risks. AFTER THE TRANSFUSION  Right after receiving a blood transfusion, you will usually feel much better and more energetic. This is especially true if your red blood cells have gotten low (anemic). The transfusion raises the level of the red blood cells which carry oxygen, and this usually causes an energy increase.  The nurse administering the transfusion will  monitor you carefully for complications. HOME CARE INSTRUCTIONS  No special instructions are needed after a transfusion. You may find your energy is better. Speak with your caregiver about any limitations on activity for underlying diseases you may have. SEEK MEDICAL CARE IF:   Your condition is not improving after your transfusion.  You develop redness or irritation at the intravenous (IV) site. SEEK IMMEDIATE MEDICAL CARE IF:  Any of the following symptoms occur over the next 12 hours:  Shaking chills.  You have a temperature by mouth above 102 F  (38.9 C), not controlled by medicine.  Chest, back, or muscle pain.  People around you feel you are not acting correctly or are confused.  Shortness of breath or difficulty breathing.  Dizziness and fainting.  You get a rash or develop hives.  You have a decrease in urine output.  Your urine turns a dark color or changes to pink, red, or [place]. Any of the following symptoms occur over the next 10 days:  You have a temperature by mouth above 102 F (38.9 C), not controlled by medicine.  Shortness of breath.  Weakness after normal activity.  The white part of the eye turns yellow (jaundice).  You have a decrease in the amount of urine or are urinating less often.  Your urine turns a dark color or changes to pink, red, or [place]. Document Released: 07/06/2000 Document Revised: 10/01/2011 Document Reviewed: 02/23/2008 [facility] Patient Information 2014 [place], [place].  _______________________________________________________________________

## 2018-02-19 ENCOUNTER — Ambulatory Visit (INDEPENDENT_AMBULATORY_CARE_PROVIDER_SITE_OTHER): Payer: PPO | Admitting: Orthopaedic Surgery

## 2018-02-19 ENCOUNTER — Encounter (HOSPITAL_COMMUNITY)
Admission: RE | Admit: 2018-02-19 | Discharge: 2018-02-19 | Disposition: A | Discharge status: Home / Self Care | Payer: PPO | Source: Ambulatory Visit | Attending: Orthopaedic Surgery | Admitting: Orthopaedic Surgery

## 2018-02-19 ENCOUNTER — Other Ambulatory Visit: Payer: Self-pay

## 2018-02-19 ENCOUNTER — Encounter (HOSPITAL_COMMUNITY): Payer: Self-pay

## 2018-02-19 DIAGNOSIS — E785 Hyperlipidemia, unspecified: Secondary | ICD-10-CM | POA: Diagnosis present

## 2018-02-19 DIAGNOSIS — Z8546 Personal history of malignant neoplasm of prostate: Secondary | ICD-10-CM | POA: Diagnosis not present

## 2018-02-19 DIAGNOSIS — E1142 Type 2 diabetes mellitus with diabetic polyneuropathy: Secondary | ICD-10-CM | POA: Diagnosis present

## 2018-02-19 DIAGNOSIS — Z471 Aftercare following joint replacement surgery: Secondary | ICD-10-CM | POA: Diagnosis not present

## 2018-02-19 DIAGNOSIS — Z9181 History of falling: Secondary | ICD-10-CM | POA: Diagnosis not present

## 2018-02-19 DIAGNOSIS — Z96651 Presence of right artificial knee joint: Secondary | ICD-10-CM | POA: Diagnosis present

## 2018-02-19 DIAGNOSIS — E669 Obesity, unspecified: Secondary | ICD-10-CM | POA: Diagnosis present

## 2018-02-19 DIAGNOSIS — M069 Rheumatoid arthritis, unspecified: Secondary | ICD-10-CM | POA: Diagnosis present

## 2018-02-19 DIAGNOSIS — I10 Essential (primary) hypertension: Secondary | ICD-10-CM | POA: Diagnosis present

## 2018-02-19 DIAGNOSIS — Z720 Tobacco use: Secondary | ICD-10-CM | POA: Diagnosis not present

## 2018-02-19 DIAGNOSIS — M1611 Unilateral primary osteoarthritis, right hip: Secondary | ICD-10-CM | POA: Diagnosis present

## 2018-02-19 DIAGNOSIS — Z6832 Body mass index (BMI) 32.0-32.9, adult: Secondary | ICD-10-CM | POA: Diagnosis not present

## 2018-02-19 DIAGNOSIS — Z8261 Family history of arthritis: Secondary | ICD-10-CM | POA: Diagnosis not present

## 2018-02-19 DIAGNOSIS — Z888 Allergy status to other drugs, medicaments and biological substances status: Secondary | ICD-10-CM | POA: Diagnosis not present

## 2018-02-19 DIAGNOSIS — Z96641 Presence of right artificial hip joint: Secondary | ICD-10-CM | POA: Diagnosis not present

## 2018-02-19 DIAGNOSIS — I4891 Unspecified atrial fibrillation: Secondary | ICD-10-CM | POA: Diagnosis present

## 2018-02-19 LAB — BASIC METABOLIC PANEL
Anion gap: 10 (ref 5–15)
BUN: 15 mg/dL (ref 8–23)
CO2: 31 mmol/L (ref 22–32)
Calcium: 9.2 mg/dL (ref 8.9–10.3)
Chloride: 101 mmol/L (ref 98–111)
Creatinine, Ser: 1.34 mg/dL — ABNORMAL HIGH (ref 0.61–1.24)
GFR calc Af Amer: 56 mL/min — ABNORMAL LOW (ref >60)
GFR calc non Af Amer: 48 mL/min — ABNORMAL LOW (ref >60)
Glucose, Bld: 111 mg/dL — ABNORMAL HIGH (ref 70–99)
Potassium: 4.4 mmol/L (ref 3.5–5.1)
Sodium: 142 mmol/L (ref 135–145)

## 2018-02-19 LAB — CBC
HCT: 37 % — ABNORMAL LOW (ref 39.0–52.0)
Hemoglobin: 12.1 g/dL — ABNORMAL LOW (ref 13.0–17.0)
MCH: 30.7 pg (ref 26.0–34.0)
MCHC: 32.7 g/dL (ref 30.0–36.0)
MCV: 93.9 fL (ref 78.0–100.0)
Platelets: 152 10*3/uL (ref 150–400)
RBC: 3.94 MIL/uL — ABNORMAL LOW (ref 4.22–5.81)
RDW: 13.5 % (ref 11.5–15.5)
WBC: 7.7 10*3/uL (ref 4.0–10.5)

## 2018-02-19 LAB — SURGICAL PCR SCREEN
MRSA, PCR: NEGATIVE
Staphylococcus aureus: NEGATIVE

## 2018-02-19 LAB — HEMOGLOBIN A1C
Hgb A1c MFr Bld: 6.4 % — ABNORMAL HIGH (ref 4.8–5.6)
Mean Plasma Glucose: 136.98 mg/dL

## 2018-02-20 ENCOUNTER — Inpatient Hospital Stay (HOSPITAL_COMMUNITY): Payer: PPO

## 2018-02-20 ENCOUNTER — Encounter (HOSPITAL_COMMUNITY): Payer: Self-pay | Admitting: Emergency Medicine

## 2018-02-20 ENCOUNTER — Inpatient Hospital Stay (HOSPITAL_COMMUNITY): Payer: PPO | Admitting: Anesthesiology

## 2018-02-20 ENCOUNTER — Encounter (HOSPITAL_COMMUNITY)
Admission: RE | Disposition: A | Discharge status: Home Health | Payer: Self-pay | Source: Ambulatory Visit | Attending: Orthopaedic Surgery

## 2018-02-20 ENCOUNTER — Inpatient Hospital Stay (HOSPITAL_COMMUNITY)
Admission: RE | Admit: 2018-02-20 | Discharge: 2018-02-23 | DRG: 470 | Disposition: A | Discharge status: Home Health | Payer: PPO | Source: Ambulatory Visit | Attending: Orthopaedic Surgery | Admitting: Orthopaedic Surgery

## 2018-02-20 DIAGNOSIS — E669 Obesity, unspecified: Secondary | ICD-10-CM | POA: Diagnosis present

## 2018-02-20 DIAGNOSIS — Z888 Allergy status to other drugs, medicaments and biological substances status: Secondary | ICD-10-CM | POA: Diagnosis not present

## 2018-02-20 DIAGNOSIS — Z8261 Family history of arthritis: Secondary | ICD-10-CM

## 2018-02-20 DIAGNOSIS — Z720 Tobacco use: Secondary | ICD-10-CM | POA: Diagnosis not present

## 2018-02-20 DIAGNOSIS — Z9181 History of falling: Secondary | ICD-10-CM

## 2018-02-20 DIAGNOSIS — Z8546 Personal history of malignant neoplasm of prostate: Secondary | ICD-10-CM | POA: Diagnosis not present

## 2018-02-20 DIAGNOSIS — Z96651 Presence of right artificial knee joint: Secondary | ICD-10-CM | POA: Diagnosis present

## 2018-02-20 DIAGNOSIS — M069 Rheumatoid arthritis, unspecified: Secondary | ICD-10-CM | POA: Diagnosis present

## 2018-02-20 DIAGNOSIS — Z96641 Presence of right artificial hip joint: Secondary | ICD-10-CM

## 2018-02-20 DIAGNOSIS — M1990 Unspecified osteoarthritis, unspecified site: Secondary | ICD-10-CM

## 2018-02-20 DIAGNOSIS — M1611 Unilateral primary osteoarthritis, right hip: Secondary | ICD-10-CM | POA: Diagnosis present

## 2018-02-20 DIAGNOSIS — I4891 Unspecified atrial fibrillation: Secondary | ICD-10-CM | POA: Diagnosis present

## 2018-02-20 DIAGNOSIS — E1142 Type 2 diabetes mellitus with diabetic polyneuropathy: Secondary | ICD-10-CM | POA: Diagnosis present

## 2018-02-20 DIAGNOSIS — I10 Essential (primary) hypertension: Secondary | ICD-10-CM | POA: Diagnosis present

## 2018-02-20 DIAGNOSIS — Z6832 Body mass index (BMI) 32.0-32.9, adult: Secondary | ICD-10-CM | POA: Diagnosis not present

## 2018-02-20 DIAGNOSIS — E785 Hyperlipidemia, unspecified: Secondary | ICD-10-CM | POA: Diagnosis present

## 2018-02-20 DIAGNOSIS — Z471 Aftercare following joint replacement surgery: Secondary | ICD-10-CM | POA: Diagnosis not present

## 2018-02-20 HISTORY — PX: TOTAL HIP ARTHROPLASTY: SHX124

## 2018-02-20 LAB — GLUCOSE, CAPILLARY: Glucose-Capillary: 111 mg/dL — ABNORMAL HIGH (ref 70–99)

## 2018-02-20 SURGERY — ARTHROPLASTY, HIP, TOTAL, ANTERIOR APPROACH
Anesthesia: Spinal | Site: Hip | Laterality: Right

## 2018-02-20 MED ORDER — BUPIVACAINE IN DEXTROSE 0.75-8.25 % IT SOLN: INTRATHECAL | Status: DC | PRN

## 2018-02-20 MED ORDER — METHOCARBAMOL 500 MG IVPB - SIMPLE MED
INTRAVENOUS | Status: AC
  Filled 2018-02-20: qty 50

## 2018-02-20 MED ORDER — OXYCODONE HCL 5 MG/5ML PO SOLN
5.0000 mg | Freq: Once | ORAL | Status: DC | PRN
  Filled 2018-02-20: qty 5

## 2018-02-20 MED ORDER — TRANEXAMIC ACID 1000 MG/10ML IV SOLN
1000.0000 mg | INTRAVENOUS | Status: AC
  Administered 2018-02-20: 1000 mg via INTRAVENOUS
  Filled 2018-02-20: qty 10

## 2018-02-20 MED ORDER — EPHEDRINE 5 MG/ML INJ
INTRAVENOUS | Status: AC
  Filled 2018-02-20: qty 10

## 2018-02-20 MED ORDER — KETOTIFEN FUMARATE 0.025 % OP SOLN
1.0000 [drp] | Freq: Four times a day (QID) | OPHTHALMIC | Status: DC
  Administered 2018-02-20 – 2018-02-23 (×10): 1 [drp] via OPHTHALMIC
  Filled 2018-02-20: qty 5

## 2018-02-20 MED ORDER — BUPIVACAINE IN DEXTROSE 0.75-8.25 % IT SOLN
INTRATHECAL | Status: DC | PRN
  Administered 2018-02-20: 1.6 mL via INTRATHECAL

## 2018-02-20 MED ORDER — SODIUM CHLORIDE 0.9 % IR SOLN
Status: DC | PRN
  Administered 2018-02-20: 1000 mL

## 2018-02-20 MED ORDER — MIDAZOLAM HCL 2 MG/2ML IJ SOLN
INTRAMUSCULAR | Status: AC
  Filled 2018-02-20: qty 2

## 2018-02-20 MED ORDER — METOCLOPRAMIDE HCL 5 MG PO TABS: 5.0000 mg | ORAL_TABLET | Freq: Three times a day (TID) | ORAL | Status: DC | PRN

## 2018-02-20 MED ORDER — ALUM & MAG HYDROXIDE-SIMETH 200-200-20 MG/5ML PO SUSP: 30.0000 mL | ORAL | Status: DC | PRN

## 2018-02-20 MED ORDER — METOCLOPRAMIDE HCL 5 MG/ML IJ SOLN: 5.0000 mg | Freq: Three times a day (TID) | INTRAMUSCULAR | Status: DC | PRN

## 2018-02-20 MED ORDER — SULFAMETHOXAZOLE-TRIMETHOPRIM 800-160 MG PO TABS
1.0000 | ORAL_TABLET | Freq: Every day | ORAL | Status: DC
  Administered 2018-02-21 – 2018-02-23 (×3): 1 via ORAL
  Filled 2018-02-20 (×3): qty 1

## 2018-02-20 MED ORDER — DEXAMETHASONE SODIUM PHOSPHATE 10 MG/ML IJ SOLN
INTRAMUSCULAR | Status: AC
  Filled 2018-02-20: qty 1

## 2018-02-20 MED ORDER — ONDANSETRON HCL 4 MG PO TABS: 4.0000 mg | ORAL_TABLET | Freq: Four times a day (QID) | ORAL | Status: DC | PRN

## 2018-02-20 MED ORDER — EPHEDRINE SULFATE-NACL 50-0.9 MG/10ML-% IV SOSY
PREFILLED_SYRINGE | INTRAVENOUS | Status: DC | PRN
  Administered 2018-02-20 (×2): 10 mg via INTRAVENOUS

## 2018-02-20 MED ORDER — PROPOFOL 10 MG/ML IV BOLUS
INTRAVENOUS | Status: AC
  Filled 2018-02-20: qty 20

## 2018-02-20 MED ORDER — OXYCODONE HCL ER 20 MG PO T12A
20.0000 mg | EXTENDED_RELEASE_TABLET | Freq: Two times a day (BID) | ORAL | Status: DC
  Administered 2018-02-20 – 2018-02-23 (×6): 20 mg via ORAL
  Filled 2018-02-20 (×7): qty 1

## 2018-02-20 MED ORDER — CEFAZOLIN SODIUM-DEXTROSE 2-4 GM/100ML-% IV SOLN
2.0000 g | INTRAVENOUS | Status: AC
  Administered 2018-02-20: 2 g via INTRAVENOUS
  Filled 2018-02-20: qty 100

## 2018-02-20 MED ORDER — PHENOL 1.4 % MT LIQD: 1.0000 | OROMUCOSAL | Status: DC | PRN

## 2018-02-20 MED ORDER — FENTANYL CITRATE (PF) 100 MCG/2ML IJ SOLN
INTRAMUSCULAR | Status: DC | PRN
  Administered 2018-02-20: 100 ug via INTRAVENOUS

## 2018-02-20 MED ORDER — PROPOFOL 10 MG/ML IV BOLUS
INTRAVENOUS | Status: AC
Start: 2018-02-20 — End: ?
  Filled 2018-02-20: qty 60

## 2018-02-20 MED ORDER — PROPOFOL 500 MG/50ML IV EMUL
INTRAVENOUS | Status: DC | PRN
  Administered 2018-02-20: 120 ug/kg/min via INTRAVENOUS

## 2018-02-20 MED ORDER — DIPHENHYDRAMINE HCL 12.5 MG/5ML PO ELIX: 12.5000 mg | ORAL_SOLUTION | ORAL | Status: DC | PRN

## 2018-02-20 MED ORDER — ACETAMINOPHEN 325 MG PO TABS
325.0000 mg | ORAL_TABLET | Freq: Four times a day (QID) | ORAL | Status: DC | PRN
  Administered 2018-02-22: 650 mg via ORAL
  Filled 2018-02-20: qty 2

## 2018-02-20 MED ORDER — PROPOFOL 10 MG/ML IV BOLUS
INTRAVENOUS | Status: DC | PRN
  Administered 2018-02-20 (×2): 20 mg via INTRAVENOUS

## 2018-02-20 MED ORDER — OXYCODONE HCL 5 MG PO TABS
10.0000 mg | ORAL_TABLET | ORAL | Status: DC | PRN
  Administered 2018-02-20 – 2018-02-21 (×2): 10 mg via ORAL
  Administered 2018-02-21: 15 mg via ORAL
  Filled 2018-02-20: qty 3
  Filled 2018-02-20: qty 2
  Filled 2018-02-20: qty 3

## 2018-02-20 MED ORDER — PHENYLEPHRINE HCL 10 MG/ML IJ SOLN
INTRAMUSCULAR | Status: AC
  Filled 2018-02-20: qty 1

## 2018-02-20 MED ORDER — APIXABAN 5 MG PO TABS
5.0000 mg | ORAL_TABLET | Freq: Two times a day (BID) | ORAL | Status: DC
  Administered 2018-02-21 – 2018-02-23 (×5): 5 mg via ORAL
  Filled 2018-02-20 (×5): qty 1

## 2018-02-20 MED ORDER — STERILE WATER FOR IRRIGATION IR SOLN
Status: DC | PRN
  Administered 2018-02-20: 2000 mL

## 2018-02-20 MED ORDER — ONDANSETRON HCL 4 MG/2ML IJ SOLN
INTRAMUSCULAR | Status: DC | PRN
  Administered 2018-02-20: 4 mg via INTRAVENOUS

## 2018-02-20 MED ORDER — ONDANSETRON HCL 4 MG/2ML IJ SOLN: 4.0000 mg | Freq: Once | INTRAMUSCULAR | Status: DC | PRN

## 2018-02-20 MED ORDER — SODIUM CHLORIDE 0.9 % IV SOLN
INTRAVENOUS | Status: DC
  Administered 2018-02-20: 16:00:00 via INTRAVENOUS

## 2018-02-20 MED ORDER — MENTHOL 3 MG MT LOZG: 1.0000 | LOZENGE | OROMUCOSAL | Status: DC | PRN

## 2018-02-20 MED ORDER — OXYCODONE HCL 5 MG PO TABS
5.0000 mg | ORAL_TABLET | ORAL | Status: DC | PRN
  Administered 2018-02-20: 5 mg via ORAL
  Administered 2018-02-20: 10 mg via ORAL
  Administered 2018-02-22: 5 mg via ORAL
  Administered 2018-02-22: 10 mg via ORAL
  Filled 2018-02-20: qty 1
  Filled 2018-02-20 (×2): qty 2
  Filled 2018-02-20: qty 1

## 2018-02-20 MED ORDER — LACTATED RINGERS IV SOLN
INTRAVENOUS | Status: DC
  Administered 2018-02-20 (×2): via INTRAVENOUS

## 2018-02-20 MED ORDER — METHOCARBAMOL 500 MG PO TABS
500.0000 mg | ORAL_TABLET | Freq: Four times a day (QID) | ORAL | Status: DC | PRN
  Administered 2018-02-20 – 2018-02-22 (×4): 500 mg via ORAL
  Filled 2018-02-20 (×4): qty 1

## 2018-02-20 MED ORDER — PANTOPRAZOLE SODIUM 40 MG PO TBEC
40.0000 mg | DELAYED_RELEASE_TABLET | Freq: Every day | ORAL | Status: DC
  Administered 2018-02-20 – 2018-02-23 (×4): 40 mg via ORAL
  Filled 2018-02-20 (×4): qty 1

## 2018-02-20 MED ORDER — FENTANYL CITRATE (PF) 100 MCG/2ML IJ SOLN
INTRAMUSCULAR | Status: AC
  Filled 2018-02-20: qty 2

## 2018-02-20 MED ORDER — CHLORHEXIDINE GLUCONATE 4 % EX LIQD: 60.0000 mL | Freq: Once | CUTANEOUS | Status: DC

## 2018-02-20 MED ORDER — METHOCARBAMOL 500 MG IVPB - SIMPLE MED
500.0000 mg | Freq: Four times a day (QID) | INTRAVENOUS | Status: DC | PRN
  Administered 2018-02-20: 500 mg via INTRAVENOUS
  Filled 2018-02-20: qty 50

## 2018-02-20 MED ORDER — OXYCODONE HCL 5 MG PO TABS: 5.0000 mg | ORAL_TABLET | Freq: Once | ORAL | Status: DC | PRN

## 2018-02-20 MED ORDER — FUROSEMIDE 40 MG PO TABS
40.0000 mg | ORAL_TABLET | Freq: Two times a day (BID) | ORAL | Status: DC
  Administered 2018-02-20 – 2018-02-22 (×3): 40 mg via ORAL
  Filled 2018-02-20 (×6): qty 1

## 2018-02-20 MED ORDER — 0.9 % SODIUM CHLORIDE (POUR BTL) OPTIME
TOPICAL | Status: DC | PRN
  Administered 2018-02-20: 1000 mL

## 2018-02-20 MED ORDER — ONDANSETRON HCL 4 MG/2ML IJ SOLN
INTRAMUSCULAR | Status: AC
  Filled 2018-02-20: qty 2

## 2018-02-20 MED ORDER — POLYETHYLENE GLYCOL 3350 17 G PO PACK: 17.0000 g | PACK | Freq: Every day | ORAL | Status: DC | PRN

## 2018-02-20 MED ORDER — ONDANSETRON HCL 4 MG/2ML IJ SOLN: 4.0000 mg | Freq: Four times a day (QID) | INTRAMUSCULAR | Status: DC | PRN

## 2018-02-20 MED ORDER — SODIUM CHLORIDE 0.9 % IV SOLN
INTRAVENOUS | Status: DC | PRN
  Administered 2018-02-20: 40 ug/min via INTRAVENOUS

## 2018-02-20 MED ORDER — MIDAZOLAM HCL 5 MG/5ML IJ SOLN
INTRAMUSCULAR | Status: DC | PRN
  Administered 2018-02-20: 1 mg via INTRAVENOUS

## 2018-02-20 MED ORDER — DOCUSATE SODIUM 100 MG PO CAPS
100.0000 mg | ORAL_CAPSULE | Freq: Two times a day (BID) | ORAL | Status: DC
  Administered 2018-02-20 – 2018-02-23 (×5): 100 mg via ORAL
  Filled 2018-02-20 (×6): qty 1

## 2018-02-20 MED ORDER — CEFAZOLIN SODIUM-DEXTROSE 1-4 GM/50ML-% IV SOLN
1.0000 g | Freq: Four times a day (QID) | INTRAVENOUS | Status: AC
  Administered 2018-02-20 (×2): 1 g via INTRAVENOUS
  Filled 2018-02-20 (×2): qty 50

## 2018-02-20 MED ORDER — FENTANYL CITRATE (PF) 100 MCG/2ML IJ SOLN: 25.0000 ug | INTRAMUSCULAR | Status: DC | PRN

## 2018-02-20 MED ORDER — HYDROMORPHONE HCL 1 MG/ML IJ SOLN
0.5000 mg | INTRAMUSCULAR | Status: DC | PRN
  Administered 2018-02-20: 0.5 mg via INTRAVENOUS
  Filled 2018-02-20: qty 1

## 2018-02-20 SURGICAL SUPPLY — 42 items
ACETAB CUP W/GRIPTION 54 (Plate) ×2 IMPLANT
APL SKNCLS STERI-STRIP NONHPOA (GAUZE/BANDAGES/DRESSINGS)
BAG SPEC THK2 15X12 ZIP CLS (MISCELLANEOUS)
BAG ZIPLOCK 12X15 (MISCELLANEOUS) IMPLANT
BENZOIN TINCTURE PRP APPL 2/3 (GAUZE/BANDAGES/DRESSINGS) IMPLANT
BLADE SAW SGTL 18X1.27X75 (BLADE) ×2 IMPLANT
COVER PERINEAL POST (MISCELLANEOUS) ×2 IMPLANT
COVER SURGICAL LIGHT HANDLE (MISCELLANEOUS) ×2 IMPLANT
CUP ACETAB W/GRIPTION 54 (Plate) IMPLANT
DRAPE STERI IOBAN 125X83 (DRAPES) ×2 IMPLANT
DRAPE U-SHAPE 47X51 STRL (DRAPES) ×4 IMPLANT
DRESSING AQUACEL AG SP 3.5X10 (GAUZE/BANDAGES/DRESSINGS) IMPLANT
DRSG AQUACEL AG ADV 3.5X10 (GAUZE/BANDAGES/DRESSINGS) ×2 IMPLANT
DRSG AQUACEL AG SP 3.5X10 (GAUZE/BANDAGES/DRESSINGS) ×2
DURAPREP 26ML APPLICATOR (WOUND CARE) ×2 IMPLANT
ELECT REM PT RETURN 15FT ADLT (MISCELLANEOUS) ×2 IMPLANT
GAUZE XEROFORM 1X8 LF (GAUZE/BANDAGES/DRESSINGS) ×1 IMPLANT
GLOVE BIO SURGEON STRL SZ7.5 (GLOVE) ×2 IMPLANT
GLOVE BIOGEL PI IND STRL 8 (GLOVE) ×2 IMPLANT
GLOVE BIOGEL PI INDICATOR 8 (GLOVE) ×2
GLOVE ECLIPSE 8.0 STRL XLNG CF (GLOVE) ×2 IMPLANT
GLOVE SURG SS PI 6.5 STRL IVOR (GLOVE) ×1 IMPLANT
GLOVE SURG SS PI 8.0 STRL IVOR (GLOVE) ×1 IMPLANT
GOWN STRL REUS W/TWL XL LVL3 (GOWN DISPOSABLE) ×4 IMPLANT
HANDPIECE INTERPULSE COAX TIP (DISPOSABLE) ×2
HEAD M SROM 36MM PLUS 1.5 (Hips) IMPLANT
HOLDER FOLEY CATH W/STRAP (MISCELLANEOUS) ×2 IMPLANT
LINER NEUTRAL 36ID 54OD (Liner) ×1 IMPLANT
PACK ANTERIOR HIP CUSTOM (KITS) ×2 IMPLANT
SET HNDPC FAN SPRY TIP SCT (DISPOSABLE) ×1 IMPLANT
SROM M HEAD 36MM PLUS 1.5 (Hips) ×2 IMPLANT
STAPLER VISISTAT 35W (STAPLE) IMPLANT
STEM CORAIL (Stem) ×1 IMPLANT
STRIP CLOSURE SKIN 1/2X4 (GAUZE/BANDAGES/DRESSINGS) IMPLANT
SUT ETHIBOND NAB CT1 #1 30IN (SUTURE) ×2 IMPLANT
SUT MNCRL AB 4-0 PS2 18 (SUTURE) IMPLANT
SUT VIC AB 0 CT1 36 (SUTURE) ×2 IMPLANT
SUT VIC AB 1 CT1 36 (SUTURE) ×2 IMPLANT
SUT VIC AB 2-0 CT1 27 (SUTURE) ×4
SUT VIC AB 2-0 CT1 TAPERPNT 27 (SUTURE) ×2 IMPLANT
TRAY FOLEY MTR SLVR 16FR STAT (SET/KITS/TRAYS/PACK) ×2 IMPLANT
YANKAUER SUCT BULB TIP 10FT TU (MISCELLANEOUS) ×2 IMPLANT

## 2018-02-20 NOTE — Anesthesia Preprocedure Evaluation (Addendum)
Anesthesia Evaluation  Patient identified by MRN, date of birth, ID band Patient awake    Reviewed: Allergy & Precautions, NPO status , Patient's Chart, lab work & pertinent test results  History of Anesthesia Complications Negative for: history of anesthetic complications  Airway Mallampati: II  TM Distance: >3 FB Neck ROM: Limited    Dental  (+) Edentulous Upper, Edentulous Lower   Pulmonary neg pulmonary ROS,    breath sounds clear to auscultation       Cardiovascular hypertension, Pt. on medications + dysrhythmias Atrial Fibrillation  Rhythm:Irregular Rate:Normal     Neuro/Psych PSYCHIATRIC DISORDERS Anxiety Depression  Spinal stenosis   Neuromuscular disease    GI/Hepatic negative GI ROS, Neg liver ROS,   Endo/Other  diabetes, Type 2 Obesity   Renal/GU negative Renal ROS    Prostate cancer     Musculoskeletal  (+) Arthritis , Osteoarthritis and Rheumatoid disorders,    Abdominal   Peds  Hematology negative hematology ROS (+)   Anesthesia Other Findings HSV  Reproductive/Obstetrics                            Anesthesia Physical  Anesthesia Plan  ASA: III  Anesthesia Plan: Spinal   Post-op Pain Management:    Induction:   PONV Risk Score and Plan: 1 and Treatment may vary due to age or medical condition and Propofol infusion  Airway Management Planned: Natural Airway and Simple Face Mask  Additional Equipment: None  Intra-op Plan:   Post-operative Plan:   Informed Consent: I have reviewed the patients History and Physical, chart, labs and discussed the procedure including the risks, benefits and alternatives for the proposed anesthesia with the patient or authorized representative who has indicated his/her understanding and acceptance.     Plan Discussed with: CRNA and Anesthesiologist  Anesthesia Plan Comments: (Labs reviewed. Platelets acceptable, patient  not taking any blood thinning medications. Risks and benefits discussed with patient, patient expressed understanding and wished to proceed.)       Anesthesia Quick Evaluation

## 2018-02-20 NOTE — Evaluation (Signed)
Physical Therapy Evaluation Patient Details Name: Ronnie Jackson MRN: [id number] DOB: 06-21-1936 Today's Date: 02/20/2018   History of Present Illness  82 YO s/p R THA anterior approach on 7/31. PMH of peripheral neuropathy, pelvic and thigh arthropathy, DM II, spinal stenosis, RA, prostate cancer, HTN, depression, and afib.     Clinical Impression  Pt is a 82 YO male s/p R THA anterior approach on 7/31. PMH as outlined above. Pt presents with decreased ability to perform bed mobility tasks, R hip pain, and suspected difficulty with OOB mobility. Pt would benefit from acute PT to address these deficits. Recommending home health PT, as per surgeon's recommendations. Will progress mobility as able tomorrow.   Pt was on supplemental O2 via nasal cannula upon arrival to room, and pt was removed from it upon PT arrival. O2sats maintained during session, and was 97% on room air upon end of session. Nursing notified.     Follow Up Recommendations Home health PT    Equipment Recommendations  None recommended by PT    Recommendations for Other Services       Precautions / Restrictions Precautions Precautions: Anterior Hip;Fall Restrictions Weight Bearing Restrictions: No Other Position/Activity Restrictions: WBAT       Mobility  Bed Mobility Overal bed mobility: Needs Assistance Bed Mobility: Supine to Sit;Sit to Supine     Supine to sit: Mod assist;+2 for physical assistance     General bed mobility comments: Physical assist for trunk elevation and lowering, RLE management to move to and from EOB, scooting to EOB. Verbal cuing for sequencing. Increased time to complete bed mobility tasks.  Transfers Overall transfer level: (Pt limited by pain, does not wish to transfer or walk today)                  Ambulation/Gait                Stairs            Wheelchair Mobility    Modified Rankin (Stroke Patients Only)       Balance Overall balance assessment:  Mild deficits observed, not formally tested                                           Pertinent Vitals/Pain Pain Assessment: 0-10 Pain Score: 7  Pain Descriptors / Indicators: Aching;Grimacing;Guarding;Sore Pain Intervention(s): Limited activity within patient's tolerance;Repositioned;Monitored during session;Premedicated before session    [place] expects to be discharged to:: Private residence Living Arrangements: Spouse/significant other Available Help at Discharge: Family;Available 24 hours/day Type of Home: House Home Access: Level entry     Home Layout: One level Home Equipment: Walker - 2 wheels;Crutches;Bedside commode Additional Comments: Has rollator as well. Pt using crutches prior to surgery.     Prior Function Level of Independence: Needs assistance   Gait / Transfers Assistance Needed: utilized crutches due to R hip pain prior to surgery  ADL's / Homemaking Assistance Needed: Pt required assist for toileting and other ADLs due to hip pain         Hand Dominance        Extremity/Trunk Assessment   Upper Extremity Assessment Upper Extremity Assessment: Generalized weakness( ) RUE Sensation: history of peripheral neuropathy LUE Sensation: history of peripheral neuropathy    Lower Extremity Assessment Lower Extremity Assessment: Generalized weakness(suspected post-operative weakness of R hip musculature due to THA)  Communication   Communication: HOH  Cognition Arousal/Alertness: Awake/alert Behavior During Therapy: WFL for tasks assessed/performed Overall Cognitive Status: Within Functional Limits for tasks assessed                                        General Comments      Exercises Total Joint Exercises Ankle Circles/Pumps: AROM;20 reps;Supine;Both   Assessment/Plan    PT Assessment Patient needs continued PT services  PT Problem List Decreased strength;Decreased  mobility;Decreased activity tolerance;Decreased balance;Decreased knowledge of use of DME       PT Treatment Interventions DME instruction;Therapeutic activities;Gait training;Therapeutic exercise;Functional mobility training;Balance training    PT Goals (Current goals can be found in the Care Plan section)  Acute Rehab PT Goals PT Goal Formulation: With patient Time For Goal Achievement: 03/06/18 Potential to Achieve Goals: Good    Frequency 7X/week   Barriers to discharge        Co-evaluation               AM-PAC PT "6 Clicks" Daily Activity  Outcome Measure Difficulty turning over in bed (including adjusting bedclothes, sheets and blankets)?: Unable Difficulty moving from lying on back to sitting on the side of the bed? : Unable Difficulty sitting down on and standing up from a chair with arms (e.g., wheelchair, bedside commode, etc,.)?: Unable Help needed moving to and from a bed to chair (including a wheelchair)?: A Lot Help needed walking in hospital room?: A Little Help needed climbing 3-5 steps with a railing? : A Lot 6 Click Score: 10    End of Session   Activity Tolerance: Patient limited by pain;Patient limited by fatigue Patient left: in bed;with bed alarm set;with call bell/phone within reach Nurse Communication: Mobility status PT Visit Diagnosis: Other abnormalities of gait and mobility (R26.89)    Time: 1635-1700 PT Time Calculation (min) (ACUTE ONLY): 25 min   Charges:   PT Evaluation $PT Eval Low Complexity: 1 Low PT Treatments $Therapeutic Activity: 8-22 mins       Kenley Rettinger Conception Chancy, PT, DPT  Pager # [phone]   [name] 02/20/2018, 5:27 PM

## 2018-02-20 NOTE — Op Note (Signed)
NAME: Ronnie Jackson, Ronnie Jackson MEDICAL RECORD [id number] ACCOUNT [id number] DATE OF BIRTH:12/19/1935 FACILITY: [facility] LOCATION: [facility] PHYSICIAN:Taima Rada Kerry Fort, MD  OPERATIVE REPORT  DATE OF PROCEDURE:  02/20/2018  PREOPERATIVE DIAGNOSIS:  Primary osteoarthritis and degenerative joint disease, right hip.  POSTOPERATIVE DIAGNOSIS:  Primary osteoarthritis and degenerative joint disease, right hip.  PROCEDURE:  Right total hip arthroplasty through direct anterior approach.  IMPLANTS:  DePuy Sector Gription acetabular component size 54, size 36+0 neutral polyethylene liner, size 13 Corail femoral component with varus offset, size 36+1.5 metal hip ball.  SURGEON:  Lind Guest. Ninfa Linden, MD  ASSISTANT:  Erskine Emery, PA-C  ANESTHESIA:  Spinal.  ANTIBIOTICS:  Two grams IV Ancef.  ESTIMATED BLOOD LOSS:  300 mL.  COMPLICATIONS:  None.  INDICATIONS:  The patient is an 82 year old gentleman with severe debilitating arthritis involving his right hip.  This has been well documented showing x-rays with debilitating arthritis of his right hip.  His pain is daily, and he ambulates with an  assistive device.  He is a significant fall risk.  His right hip pain has detrimentally affected his activities of daily living, his quality of life, and his mobility.  At this point, he does wish to proceed with a total hip arthroplasty.  I talked to  him about the risk of acute blood loss anemia, nerve or vessel injury, fracture, infection, dislocation, DVT.  He understands our goals are to decrease pain, improve mobility, and overall improve quality of life.  DESCRIPTION OF PROCEDURE:  After informed consent was obtained, appropriate right hip was marked.  He was brought to the operating room and sat up on a stretcher where spinal anesthesia could be obtained.  A Foley catheter was then placed.  He was placed  in supine position, and we assessed his leg lengths.  His right operative leg is definitely  shorter and externally rotates.  We placed traction boots on both his feet.  Next, we placed him supine on the Hana fracture table with a perineal post in place  and both legs in an in-line skeletal traction device with no traction applied.  His right operative hip was prepped and draped with DuraPrep and sterile drapes.  Time-out was called, and he was identified as correct patient, correct right hip.  We then  made an incision just inferior and posterior to the anterior superior iliac spine and carried this obliquely down the leg.  We dissected down tensor fascia lata muscle.  Tensor fascia was then divided longitudinally to proceed with direct anterior  approach to the hip.  We identified and cauterized circumflex vessels.  I then identified the hip capsule, opened the hip capsule in an L-type format, finding a large joint effusion and significant and severe arthritis throughout his right hip.  I placed  a Cobra retractor around the medial and lateral femoral neck, and we made our femoral neck cut with an oscillating saw just proximal to the lesser trochanter and completed this with an osteotome.  We placed a corkscrew guide in the femoral head and  removed the femoral head in its entirety and found it to be completely devoid of cartilage.  We then placed a bent Hohmann over the medial acetabular rim and removed remnants of the acetabular labrum and other debris.  We then began reaming from a size  44 reamer in stepwise increments up to a size 53 with all reamers under direct visualization, the last reamer under direct fluoroscopy so we could obtain our depth of reaming, our  inclination and anteversion.  I then placed the real DePuy Sector Gription  acetabular component size 54 and a 36+0 neutral polyethylene liner for that size 54 acetabular component.  Attention was then turned to the femur.  With the leg externally rotated to 120 degrees, extended and adducted, we were able to place a Mueller   retractor medially and a Hohmann retractor behind the greater trochanter.  We released the joint capsule and used a box-cutting osteotome to enter femoral canal and a rongeur to lateralize it.  Then, we began broaching with a size 8 broach using the  Corail broaching system going to a size 13.  With the 13 in place, we trialed a varus offset femoral neck and a 36+1.5 hip ball.  We brought the leg back over and up and with traction and internal rotation reducing the pelvis.  At that point, we were  pleased with leg length, offset, range of motion and stability.  We then dislocated the hip and removed the trial components.  We placed the real Corail femoral component size 13 with a varus offset and the real 36+1.5 metal hip ball and again reduced  this in the acetabulum, and it was stable.  We then irrigated the soft tissue with normal saline solution using pulsatile lavage.  There was no joint capsule closed, having removed it due to stiffness.  We closed the  tensor fascia with interrupted #1  Vicryl suture, followed by 0 Vicryl in the deep tissue, 2-0 Vicryl subcutaneous tissue and interrupted staples on the skin.  Xeroform and Aquacel dressings were applied.  He was taken off the Hana table and taken to recovery room in stable condition.   All final counts were correct.  There were no complications noted.  Of note, Benita Stabile, PA-C, assisted the entire case.  His assistance was crucial for facilitating all aspects of this case.  LN/NUANCE  D:02/20/2018 T:02/20/2018 JOB:[id number]

## 2018-02-20 NOTE — H&P (Signed)
TOTAL HIP ADMISSION H&P  Patient is admitted for right total hip arthroplasty.  Subjective:  Chief Complaint: right hip pain  HPI: Ronnie Jackson, 82 y.o. male, has a history of pain and functional disability in the right hip(s) due to arthritis and patient has failed non-surgical conservative treatments for greater than 12 weeks to include NSAID's and/or analgesics, corticosteriod injections, viscosupplementation injections, flexibility and strengthening excercises, supervised PT with diminished ADL's post treatment, use of assistive devices and activity modification.  Onset of symptoms was gradual starting 5 years ago with gradually worsening course since that time.The patient noted no past surgery on the right hip(s).  Patient currently rates pain in the right hip at 10 out of 10 with activity. Patient has night pain, worsening of pain with activity and weight bearing, trendelenberg gait, pain that interfers with activities of daily living, pain with passive range of motion and crepitus. Patient has evidence of subchondral cysts, subchondral sclerosis, periarticular osteophytes and joint space narrowing by imaging studies. This condition presents safety issues increasing the risk of falls.  There is no current active infection.  Patient Active Problem List   Diagnosis Date Noted  . Unilateral primary osteoarthritis, right hip 01/15/2018  . Hole in colon [facility]) 04/18/2017  . DDD (degenerative disc disease), cervical 04/18/2017  . Hypokalemia 03/20/2016  . Atrial fibrillation ([place]) 03/06/2016  . Paresthesia 02/08/2016  . Lumbar pain 07/05/2015  . Cough 07/05/2015  . Edema 06/21/2015  . Dysphagia, pharyngoesophageal phase 06/29/2014  . Indigestion 02/24/2014  . Pain in joint, shoulder region 09/15/2013  . Pain, knee 09/15/2013  . Obesity (BMI 30-39.9) 09/15/2013  . Pain in joint, pelvic region and thigh 09/15/2013  . Osteoarthritis   . Rheumatoid arthritis with rheumatoid factor (HCC)   .  Malignant neoplasm of prostate ([place])   . Insomnia, unspecified   . Chronic pain syndrome   . Hyperlipemia   . DM type 2 with diabetic peripheral neuropathy [facility])    Past Medical History:  Diagnosis Date  . Anxiety state, unspecified   . Atrial fibrillation ([place]) 03/06/2016  . Chronic pain syndrome   . Constipation   . Depressive disorder, not elsewhere classified   . Dysphagia, unspecified(787.20)   . Herpes simplex disease   . Hyperlipidemia   . Hypertension   . Hypopotassemia   . Insomnia, unspecified   . Intestinovesical fistula   . Malignant neoplasm of prostate ([place])   . Myalgia and myositis, unspecified   . Osteoarthritis   . Pain in joint, lower leg    Bilateral knee pains  . Reflux esophagitis   . Rheumatoid arthritis with rheumatoid factor (HCC)   . Spinal stenosis, unspecified region other than cervical   . Thrombocytopenia, unspecified ([place])   . Type II or unspecified type diabetes mellitus without mention of complication, not stated as uncontrolled    Pt states that it is prediabetic  . Unspecified arthropathy, pelvic region and thigh   . Unspecified hereditary and idiopathic peripheral neuropathy     Past Surgical History:  Procedure Laterality Date  . CARDIAC CATHETERIZATION  01/31/2004   Dr Gwenlyn Found  . CARDIOVERSION N/A 07/12/2016   Procedure: CARDIOVERSION;  Surgeon: Pixie Casino, MD;  Location: [place];  Service: Cardiovascular;  Laterality: N/A;  . COLONOSCOPY  08/09/2006   Dr Christian Mate, hemorrhoids/rectal fistula  . cystocopy  07/2006   Dr Harlow Asa  . NASAL SINUS SURGERY  1992  . PROSTATE SURGERY  1991    Current Facility-Administered Medications  Medication Dose  Route Frequency Provider Last Rate Last Dose  . ceFAZolin (ANCEF) IVPB 2g/100 mL premix  2 g Intravenous On Call to OR Pete Pelt, PA-C      . chlorhexidine (HIBICLENS) 4 % liquid 4 application  60 mL Topical Once Erskine Emery W, PA-C      . lactated ringers infusion    Intravenous Continuous Albertha Ghee, MD 50 mL/hr at 02/20/18 5643    . tranexamic acid (CYKLOKAPRON) 1,000 mg in sodium chloride 0.9 % 100 mL IVPB  1,000 mg Intravenous To OR Pete Pelt, PA-C       Allergies  Allergen Reactions  . Indocin [Indomethacin]     Pt unaware  . Methadone     Pt unaware    Social History   Tobacco Use  . Smoking status: Never Smoker  . Smokeless tobacco: Current User    Types: Chew  . Tobacco comment: Chews tobacco daily   Substance Use Topics  . Alcohol use: No    Family History  Problem Relation Age of Onset  . Prostate cancer Father   . Heart failure Mother   . Lung cancer Daughter   . Arthritis Sister      Review of Systems  Musculoskeletal: Positive for joint pain.  All other systems reviewed and are negative.   Objective:  Physical Exam  Constitutional: He is oriented to person, place, and time. He appears well-developed and well-nourished.  HENT:  Head: Normocephalic and atraumatic.  Eyes: Pupils are equal, round, and reactive to light. EOM are normal.  Neck: Normal range of motion. Neck supple.  Cardiovascular: Normal rate.  Respiratory: Effort normal and breath sounds normal.  GI: Soft. Bowel sounds are normal.  Musculoskeletal:       Right hip: He exhibits decreased range of motion, decreased strength, tenderness and bony tenderness.  Neurological: He is alert and oriented to person, place, and time.  Skin: Skin is warm and dry.  Psychiatric: He has a normal mood and affect.    Vital signs in last 24 hours: Temp:  [98.3 F (36.8 C)] 98.3 F (36.8 C) (08/01 0753) Pulse Rate:  [92] 92 (08/01 0753) Resp:  [18] 18 (08/01 0753) BP: (135)/(100) 135/100 (08/01 0753) SpO2:  [97 %] 97 % (08/01 0753) Weight:  [215 lb (97.5 kg)] 215 lb (97.5 kg) (08/01 0821)  Labs:   Estimated body mass index is 32.69 kg/m as calculated from the following:   Height as of this encounter: 5\' 8"  (1.727 m).   Weight as of this encounter:  215 lb (97.5 kg).   Imaging Review Plain radiographs demonstrate severe degenerative joint disease of the right hip(s). The bone quality appears to be good for age and reported activity level.    Preoperative templating of the joint replacement has been completed, documented, and submitted to the Operating Room personnel in order to optimize intra-operative equipment management.     Assessment/Plan:  End stage arthritis, right hip(s)  The patient history, physical examination, clinical judgement of the provider and imaging studies are consistent with end stage degenerative joint disease of the right hip(s) and total hip arthroplasty is deemed medically necessary. The treatment options including medical management, injection therapy, arthroscopy and arthroplasty were discussed at length. The risks and benefits of total hip arthroplasty were presented and reviewed. The risks due to aseptic loosening, infection, stiffness, dislocation/subluxation,  thromboembolic complications and other imponderables were discussed.  The patient acknowledged the explanation, agreed to proceed with the plan and consent was  signed. Patient is being admitted for inpatient treatment for surgery, pain control, PT, OT, prophylactic antibiotics, VTE prophylaxis, progressive ambulation and ADL's and discharge planning.The patient is planning to be discharged home with home health services

## 2018-02-20 NOTE — Anesthesia Procedure Notes (Signed)
Spinal  Patient location during procedure: OR Start time: 02/20/2018 10:59 AM End time: 02/20/2018 11:04 AM Staffing Resident/CRNA: Sharlette Dense, CRNA Performed: resident/CRNA  Preanesthetic Checklist Completed: patient identified, site marked, surgical consent, pre-op evaluation, timeout performed, IV checked, risks and benefits discussed and monitors and equipment checked Spinal Block Patient position: sitting Prep: DuraPrep Patient monitoring: heart rate and continuous pulse ox Approach: midline Location: L4-5 Needle Needle type: Sprotte  Needle gauge: 24 G Needle length: 9 cm Additional Notes Kit expiration date 12/21/2019 and lot #[id number] Clear free flow of CSF, negative heme, negative paresthesia Returned to supine position and tolerated well

## 2018-02-20 NOTE — Anesthesia Postprocedure Evaluation (Signed)
Anesthesia Post Note  Patient: Ronnie Jackson  Procedure(s) Performed: RIGHT TOTAL HIP ARTHROPLASTY ANTERIOR APPROACH (Right Hip)     Patient location during evaluation: PACU Anesthesia Type: Spinal Level of consciousness: awake and alert Pain management: pain level controlled Vital Signs Assessment: post-procedure vital signs reviewed and stable Respiratory status: spontaneous breathing and respiratory function stable Cardiovascular status: blood pressure returned to baseline and stable Postop Assessment: spinal receding and no apparent nausea or vomiting Anesthetic complications: no    Last Vitals:  Vitals:   02/20/18 1345 02/20/18 1400  BP: 115/85 107/68  Pulse: (!) 58 (!) 51  Resp: (!) 21 14  Temp:  (!) 36.3 C  SpO2: 100% 100%    Last Pain:  Vitals:   02/20/18 1400  TempSrc:   PainSc: 0-No pain    LLE Motor Response: Purposeful movement (02/20/18 1400) LLE Sensation: Decreased (02/20/18 1400) RLE Motor Response: Purposeful movement (02/20/18 1400) RLE Sensation: Decreased (02/20/18 1400) L Sensory Level: L3-Anterior knee, lower leg (02/20/18 1400) R Sensory Level: L3-Anterior knee, lower leg (02/20/18 1400)  Audry Pili

## 2018-02-20 NOTE — Brief Op Note (Signed)
02/20/2018  12:24 PM  PATIENT:  Ronnie Jackson  82 y.o. male  PRE-OPERATIVE DIAGNOSIS:  osteoarthritis right hip  POST-OPERATIVE DIAGNOSIS:  osteoarthritis right hip  PROCEDURE:  Procedure(s): RIGHT TOTAL HIP ARTHROPLASTY ANTERIOR APPROACH (Right)  SURGEON:  Surgeon(s) and Role:    Mcarthur Rossetti, MD - Primary  PHYSICIAN ASSISTANT: Benita Stabile, PA-C  ANESTHESIA:   spinal  EBL:  275 mL   COUNTS:  YES  DICTATION: .Other Dictation: Dictation Number [id number]  PLAN OF CARE: Admit to inpatient   PATIENT DISPOSITION:  PACU - hemodynamically stable.   Delay start of Pharmacological VTE agent (>24hrs) due to surgical blood loss or risk of bleeding: no

## 2018-02-20 NOTE — Anesthesia Procedure Notes (Signed)
Date/Time: 02/20/2018 10:57 AM Performed by: Sharlette Dense, CRNA Oxygen Delivery Method: Simple face mask

## 2018-02-20 NOTE — Transfer of Care (Signed)
Immediate Anesthesia Transfer of Care Note  Patient: Ronnie Jackson  Procedure(s) Performed: RIGHT TOTAL HIP ARTHROPLASTY ANTERIOR APPROACH (Right Hip)  Patient Location: PACU  Anesthesia Type:Spinal  Level of Consciousness: sedated  Airway & Oxygen Therapy: Patient Spontanous Breathing and Patient connected to face mask oxygen  Post-op Assessment: Report given to RN and Post -op Vital signs reviewed and stable  Post vital signs: Reviewed and stable  Last Vitals:  Vitals Value Taken Time  BP 103/67 02/20/2018 12:40 PM  Temp    Pulse 69 02/20/2018 12:43 PM  Resp 13 02/20/2018 12:43 PM  SpO2 100 % 02/20/2018 12:43 PM  Vitals shown include unvalidated device data.  Last Pain:  Vitals:   02/20/18 0821  TempSrc:   PainSc: 0-No pain      Patients Stated Pain Goal: 4 (74/09/92 7800)  Complications: No apparent anesthesia complications

## 2018-02-21 ENCOUNTER — Encounter (HOSPITAL_COMMUNITY): Payer: Self-pay | Admitting: Orthopaedic Surgery

## 2018-02-21 LAB — CBC
HCT: 31.5 % — ABNORMAL LOW (ref 39.0–52.0)
Hemoglobin: 10.4 g/dL — ABNORMAL LOW (ref 13.0–17.0)
MCH: 30.7 pg (ref 26.0–34.0)
MCHC: 33 g/dL (ref 30.0–36.0)
MCV: 92.9 fL (ref 78.0–100.0)
Platelets: 142 10*3/uL — ABNORMAL LOW (ref 150–400)
RBC: 3.39 MIL/uL — ABNORMAL LOW (ref 4.22–5.81)
RDW: 13.3 % (ref 11.5–15.5)
WBC: 9.6 10*3/uL (ref 4.0–10.5)

## 2018-02-21 LAB — BASIC METABOLIC PANEL
Anion gap: 9 (ref 5–15)
BUN: 15 mg/dL (ref 8–23)
CO2: 28 mmol/L (ref 22–32)
Calcium: 8.6 mg/dL — ABNORMAL LOW (ref 8.9–10.3)
Chloride: 102 mmol/L (ref 98–111)
Creatinine, Ser: 0.99 mg/dL (ref 0.61–1.24)
GFR calc Af Amer: >60 mL/min (ref >60)
GFR calc non Af Amer: >60 mL/min (ref >60)
Glucose, Bld: 206 mg/dL — ABNORMAL HIGH (ref 70–99)
Potassium: 4 mmol/L (ref 3.5–5.1)
Sodium: 139 mmol/L (ref 135–145)

## 2018-02-21 MED ORDER — OXYCODONE-ACETAMINOPHEN 5-325 MG PO TABS: 1.0000 | ORAL_TABLET | ORAL | 0 refills | Status: DC | PRN

## 2018-02-21 NOTE — Evaluation (Signed)
Occupational Therapy Evaluation Patient Details Name: Ronnie Jackson MRN: [id number] DOB: 03-01-1936 Today's Date: 02/21/2018    History of Present Illness 82 YO s/p R THA anterior approach on 7/31. PMH of peripheral neuropathy, pelvic and thigh arthropathy, DM II, spinal stenosis, RA, prostate cancer, HTN, depression, and afib.    Clinical Impression   Pt was admitted for the above sx.  He needs min +2 assistance for sit to stand during adls and for transfers.  Educated on AE, but pt did not use this session. Wife present, using a 4 WW but she states she can assist with adls as needed. Will follow in acute setting with min A level goals    Follow Up Recommendations  Supervision/Assistance - 24 hour    Equipment Recommendations  (to be determined:  has high commode/bar)    Recommendations for Other Services       Precautions / Restrictions Precautions Precautions: Fall Restrictions Weight Bearing Restrictions: No Other Position/Activity Restrictions: WBAT       Mobility Bed Mobility Overal bed mobility: Needs Assistance Bed Mobility: (up in chair upon arrival to room )       Sit to supine: Min assist;+2 for physical assistance   General bed mobility comments: Min assist for RLE management (raising and scooting), mod assist for scooting up in bed. verbal cuing for LLE pushing and assisting with bed mobility tasks.   Transfers Overall transfer level: Needs assistance Equipment used: Rolling walker (2 wheeled) Transfers: Sit to/from Stand Sit to Stand: Min assist;+2 physical assistance         General transfer comment: min assist +2 to power up from and extend hips for upright standing. Steadying required upon initial standing, as well as R knee extension assist.     Balance Overall balance assessment: Mild deficits observed, not formally tested                                         ADL either performed or assessed with clinical judgement   ADL  Overall ADL's : Needs assistance/impaired Eating/Feeding: Independent   Grooming: Set up;Sitting   Upper Body Bathing: Set up;Sitting   Lower Body Bathing: Moderate assistance;+2 for physical assistance;Sit to/from stand   Upper Body Dressing : Minimal assistance;Sitting   Lower Body Dressing: Total assistance;Sit to/from stand   Toilet Transfer: Minimal assistance;+2 for safety/equipment;+2 for physical assistance;Ambulation(chair to bed)             General ADL Comments: wife present with 4 WW.  She can assist with adls, but had me show pt AE--did not use this session     Vision         Perception     Praxis      Pertinent Vitals/Pain Pain Assessment: 0-10 Pain Score: 5  Pain Location: R hip  Pain Descriptors / Indicators: Aching;Operative site guarding;Grimacing;Constant Pain Intervention(s): Limited activity within patient's tolerance;Monitored during session;Premedicated before session;Repositioned     Hand Dominance     Extremity/Trunk Assessment Upper Extremity Assessment Upper Extremity Assessment: Generalized weakness(bil UEs to 90; hurt more than usual)           Communication     Cognition Arousal/Alertness: Awake/alert Behavior During Therapy: WFL for tasks assessed/performed Overall Cognitive Status: Within Functional Limits for tasks assessed  General Comments: HOH    General Comments       Exercises Total Joint Exercises Ankle Circles/Pumps: AROM;20 reps;Supine;Both Quad Sets: AROM;Right;10 reps;Supine Short Arc Quad: Right;Supine;AAROM;5 reps Heel Slides: AAROM;Right;5 reps;Supine Hip ABduction/ADduction: AAROM;5 reps;Supine;Right   Shoulder Instructions      Home Living Family/patient expects to be discharged to:: Private residence Living Arrangements: Spouse/significant other Available Help at Discharge: Family;Available 24 hours/day               Bathroom Shower/Tub:  Occupational psychologist: Handicapped height     Home Equipment: Grab bars - toilet   Additional Comments: no seat in shower      Prior Functioning/Environment Level of Independence: Needs assistance        Comments: pt states he was able to get into shower right before sx.          OT Problem List: Decreased strength;Decreased knowledge of use of DME or AE;Decreased activity tolerance;Pain      OT Treatment/Interventions: Self-care/ADL training;DME and/or AE instruction;Patient/family education;Therapeutic activities    OT Goals(Current goals can be found in the care plan section) Acute Rehab OT Goals Patient Stated Goal: less pain; return to independence OT Goal Formulation: With patient Time For Goal Achievement: 03/07/18 Potential to Achieve Goals: Good  OT Frequency: Min 2X/week   Barriers to D/C:            Co-evaluation   Reason for Co-Treatment: For patient/therapist safety PT goals addressed during session: Strengthening/ROM OT goals addressed during session: ADL's and self-care      AM-PAC PT "6 Clicks" Daily Activity     Outcome Measure Help from another person eating meals?: None Help from another person taking care of personal grooming?: A Little Help from another person toileting, which includes using toliet, bedpan, or urinal?: A Little Help from another person bathing (including washing, rinsing, drying)?: A Lot Help from another person to put on and taking off regular upper body clothing?: A Little Help from another person to put on and taking off regular lower body clothing?: Total 6 Click Score: 16   End of Session    Activity Tolerance: Patient tolerated treatment well Patient left: in bed(with PT)  OT Visit Diagnosis: Pain Pain - Right/Left: Right Pain - part of body: Hip                Time: 9432-7614 OT Time Calculation (min): 21 min Charges:  OT General Charges $OT Visit: 1 Visit OT Evaluation $OT Eval Low Complexity:  1 Low  Ronnie Jackson, OTR/L [phone] 02/21/2018  Ronnie Jackson 02/21/2018, 3:38 PM

## 2018-02-21 NOTE — Progress Notes (Signed)
Physical Therapy Treatment Patient Details Name: Ronnie Jackson MRN: [id number] DOB: 1935-08-03 Today's Date: 02/21/2018    History of Present Illness 82 YO s/p R THA anterior approach on 7/31. PMH of peripheral neuropathy, pelvic and thigh arthropathy, DM II, spinal stenosis, RA, prostate cancer, HTN, depression, and afib.     PT Comments    Pt with improved scooting and less assist required during bed mobility. Pt with increased pain during standing and ambulation tasks, requiring rest breaks during transitions and ambulation. Pt requires max verbal encouragement during session due to high pain level and difficulty hearing. After session, pt reports not wanting ice on R hip because it "increases my pain". Will see this afternoon to work on RLE exercises and ambulation.    Follow Up Recommendations  Home health PT     Equipment Recommendations  None recommended by PT    Recommendations for Other Services       Precautions / Restrictions Precautions Precautions: Anterior Hip;Fall Restrictions Weight Bearing Restrictions: No Other Position/Activity Restrictions: WBAT     Mobility  Bed Mobility Overal bed mobility: Needs Assistance Bed Mobility: Supine to Sit     Supine to sit: +2 for physical assistance;Min assist     General bed mobility comments: Min assist +2 for trunk elevation, RLE management to move to EOB. Max verbal cuing for bed mobility. Increased time required.   Transfers Overall transfer level: Needs assistance Equipment used: Rolling walker (2 wheeled) Transfers: Sit to/from Stand Sit to Stand: Min assist;+2 physical assistance         General transfer comment: min assist +2 for power up from bed, R knee extension, and trunk extension. Max verbal cuing for sequencing and hand placement.   Ambulation/Gait Ambulation/Gait assistance: Min assist;+2 physical assistance;+2 safety/equipment Gait Distance (Feet): 25 Feet Assistive device: Rolling walker (2  wheeled) Gait Pattern/deviations: Step-to pattern;Decreased stance time - right;Antalgic;Trunk flexed Gait velocity: decreased    General Gait Details: Inconsistent step length, verbal cuing to keep feet within boundaries of RW. R knee extension assist provided during first 15 feet of ambulation, R knee guarding throughout ambulation. 1 standing rest break after approximately 10 feet of ambulation.    Stairs             Wheelchair Mobility    Modified Rankin (Stroke Patients Only)       Balance Overall balance assessment: Mild deficits observed, not formally tested                                          Cognition Arousal/Alertness: Awake/alert Behavior During Therapy: WFL for tasks assessed/performed Overall Cognitive Status: Within Functional Limits for tasks assessed                                 General Comments: pt requires verbal repetition to follow instructions, partly due to hearing difficulties      Exercises      General Comments        Pertinent Vitals/Pain Pain Assessment: 0-10 Pain Score: 4  Pain Descriptors / Indicators: Aching;Operative site guarding;Grimacing;Constant Pain Intervention(s): Limited activity within patient's tolerance;Repositioned;Monitored during session;Premedicated before session;Patient requesting pain meds-RN notified    Home Living                      Prior Function  PT Goals (current goals can now be found in the care plan section) Acute Rehab PT Goals PT Goal Formulation: With patient Time For Goal Achievement: 02/21/18 Potential to Achieve Goals: Good Progress towards PT goals: Progressing toward goals    Frequency    7X/week      PT Plan Current plan remains appropriate    Co-evaluation              AM-PAC PT "6 Clicks" Daily Activity  Outcome Measure  Difficulty turning over in bed (including adjusting bedclothes, sheets and blankets)?:  Unable Difficulty moving from lying on back to sitting on the side of the bed? : Unable Difficulty sitting down on and standing up from a chair with arms (e.g., wheelchair, bedside commode, etc,.)?: Unable Help needed moving to and from a bed to chair (including a wheelchair)?: A Lot Help needed walking in hospital room?: A Lot Help needed climbing 3-5 steps with a railing? : A Lot 6 Click Score: 9    End of Session Equipment Utilized During Treatment: Gait belt Activity Tolerance: Patient limited by pain;Patient limited by fatigue Patient left: with call bell/phone within reach;in chair;with chair alarm set Nurse Communication: Mobility status;Patient requests pain meds PT Visit Diagnosis: Other abnormalities of gait and mobility (R26.89);Difficulty in walking, not elsewhere classified (R26.2)     Time: 1540-0867 PT Time Calculation (min) (ACUTE ONLY): 29 min  Charges:  $Gait Training: 23-37 mins                     Julien Girt, PT, DPT  Pager # [phone]    Arminta Gamm D Clarice Zulauf 02/21/2018, 12:10 PM

## 2018-02-21 NOTE — Plan of Care (Signed)
Plan of care discussed.   

## 2018-02-21 NOTE — Progress Notes (Signed)
Physical Therapy Treatment Patient Details Name: Ronnie Jackson MRN: [id number] DOB: 28-Dec-1935 Today's Date: 02/21/2018    History of Present Illness 82 YO s/p R THA anterior approach on 7/31. PMH of peripheral neuropathy, pelvic and thigh arthropathy, DM II, spinal stenosis, RA, prostate cancer, HTN, depression, and afib.     PT Comments    Pt with improved endurance for ambulation this afternoon. Pt still requiring mod verbal cuing during session for RW sequencing, turning, transfers, and bed mobility. Started exercises with pt today, but pt had limited endurance for RLE strenghening due to R hip pain. Pt would like follow-up tomorrow to review exercises. During session, PT discussed home health PT recommendation with pt. Wife agrees with recommendation, and pt now states that he feels it would be beneficial. Follow up with pt tomorrow and progress mobility as tolerated.    Follow Up Recommendations  Home health PT     Equipment Recommendations  None recommended by PT    Recommendations for Other Services       Precautions / Restrictions Precautions Precautions: Fall Restrictions Weight Bearing Restrictions: No Other Position/Activity Restrictions: WBAT     Mobility  Bed Mobility Overal bed mobility: Needs Assistance Bed Mobility: (up in chair upon arrival to room )       Sit to supine: Min assist;+2 for physical assistance   General bed mobility comments: Min assist for RLE management (raising and scooting), mod assist for scooting up in bed. verbal cuing for LLE pushing and assisting with bed mobility tasks.   Transfers Overall transfer level: Needs assistance Equipment used: Rolling walker (2 wheeled) Transfers: Sit to/from Stand Sit to Stand: Min assist;+2 physical assistance         General transfer comment: min assist +2 to power up from and extend hips for upright standing. Steadying required upon initial standing, as well as R knee extension assist.    Ambulation/Gait Ambulation/Gait assistance: Min assist;+2 physical assistance;+2 safety/equipment Gait Distance (Feet): 50 Feet Assistive device: Rolling walker (2 wheeled) Gait Pattern/deviations: Step-to pattern;Decreased stance time - right;Antalgic;Trunk flexed Gait velocity: decreased    General Gait Details: As this morning, inconsistent step length, verbal cuing to keep feet within boundaries of RW. increased gait speed from this morning, but still slower than PLOF. Requires max verbal cuing to stay in boundaries of RW when turning.    Stairs             Wheelchair Mobility    Modified Rankin (Stroke Patients Only)       Balance Overall balance assessment: Mild deficits observed, not formally tested                                          Cognition Arousal/Alertness: Awake/alert Behavior During Therapy: WFL for tasks assessed/performed Overall Cognitive Status: Within Functional Limits for tasks assessed                                 General Comments: [facility]       Exercises Total Joint Exercises Ankle Circles/Pumps: AROM;20 reps;Supine;Both Quad Sets: AROM;Right;10 reps;Supine Short Arc Quad: Right;Supine;AAROM;5 reps Heel Slides: AAROM;Right;5 reps;Supine Hip ABduction/ADduction: AAROM;5 reps;Supine;Right    General Comments        Pertinent Vitals/Pain Pain Assessment: 0-10 Pain Score: 5  Pain Location: R hip  Pain Descriptors / Indicators:  Aching;Operative site guarding;Grimacing;Constant Pain Intervention(s): Limited activity within patient's tolerance;Monitored during session;Premedicated before session;Repositioned    Home Living Family/patient expects to be discharged to:: Private residence Living Arrangements: Spouse/significant other Available Help at Discharge: Family;Available 24 hours/day         Home Equipment: Grab bars - toilet Additional Comments: no seat in shower    Prior Function Level of  Independence: Needs assistance      Comments: pt states he was able to get into shower right before sx.     PT Goals (current goals can now be found in the care plan section) Acute Rehab PT Goals Patient Stated Goal: less pain; return to independence PT Goal Formulation: With patient Time For Goal Achievement: 02/21/18 Potential to Achieve Goals: Good Progress towards PT goals: Progressing toward goals    Frequency    7X/week      PT Plan Current plan remains appropriate    Co-evaluation PT/OT/SLP Co-Evaluation/Treatment: Yes Reason for Co-Treatment: For patient/therapist safety PT goals addressed during session: Strengthening/ROM OT goals addressed during session: ADL's and self-care      AM-PAC PT "6 Clicks" Daily Activity  Outcome Measure  Difficulty turning over in bed (including adjusting bedclothes, sheets and blankets)?: Unable Difficulty moving from lying on back to sitting on the side of the bed? : Unable Difficulty sitting down on and standing up from a chair with arms (e.g., wheelchair, bedside commode, etc,.)?: Unable Help needed moving to and from a bed to chair (including a wheelchair)?: A Little Help needed walking in hospital room?: A Lot Help needed climbing 3-5 steps with a railing? : A Lot 6 Click Score: 10    End of Session Equipment Utilized During Treatment: Gait belt Activity Tolerance: Patient limited by pain;Patient limited by fatigue;Patient tolerated treatment well Patient left: with call bell/phone within reach;in bed;with bed alarm set Nurse Communication: Mobility status PT Visit Diagnosis: Other abnormalities of gait and mobility (R26.89);Difficulty in walking, not elsewhere classified (R26.2)     Time: 6256-3893 PT Time Calculation (min) (ACUTE ONLY): 26 min  Charges:  $Therapeutic Exercise: 8-22 mins                     Joquan Lotz Conception Chancy, PT, DPT  Pager # [phone]     Natsha Guidry D Adelee Hannula 02/21/2018, 3:39 PM

## 2018-02-21 NOTE — Discharge Instructions (Signed)

## 2018-02-21 NOTE — Progress Notes (Signed)
Spoke with patient and spouse at bedside. Discussed orders for HHPT, patient does not want and wife does. Discussed with PT, likely he will need. Patient agreeable if PT says he needs it. Contacted [facility] for referral, they accepted. Has DME. [phone]

## 2018-02-21 NOTE — Progress Notes (Signed)
Subjective: 1 Day Post-Op Procedure(s) (LRB): RIGHT TOTAL HIP ARTHROPLASTY ANTERIOR APPROACH (Right) Patient reports pain as mild.  No complaints.   Objective: Vital signs in last 24 hours: Temp:  [97.4 F (36.3 C)-98.9 F (37.2 C)] 98.9 F (37.2 C) (08/02 0558) Pulse Rate:  [51-99] 79 (08/02 0558) Resp:  [12-21] 17 (08/02 0558) BP: (103-143)/(61-85) 129/79 (08/02 0558) SpO2:  [96 %-100 %] 97 % (08/02 0558)  Intake/Output from previous day: 08/01 0701 - 08/02 0700 In: 3542.7 [P.O.:1090; I.V.:2054.4; IV Piggyback:398.3] Out: 2400 [Urine:2125; Blood:275] Intake/Output this shift: No intake/output data recorded.  Recent Labs    02/19/18 0930 02/21/18 0536  HGB 12.1* 10.4*   Recent Labs    02/19/18 0930 02/21/18 0536  WBC 7.7 9.6  RBC 3.94* 3.39*  HCT 37.0* 31.5*  PLT 152 142*   Recent Labs    02/19/18 0930 02/21/18 0536  NA 142 139  K 4.4 4.0  CL 101 102  CO2 31 28  BUN 15 15  CREATININE 1.34* 0.99  GLUCOSE 111* 206*  CALCIUM 9.2 8.6*   No results for input(s): LABPT, INR in the last 72 hours.  Right hip: Intact pulses distally Dorsiflexion/Plantar flexion intact Incision: scant drainage Compartment soft    Assessment/Plan: 1 Day Post-Op Procedure(s) (LRB): RIGHT TOTAL HIP ARTHROPLASTY ANTERIOR APPROACH (Right) Up with therapy Discharge home with home health over the next 1-2 days.     Ronnie Jackson 02/21/2018, 9:32 AM

## 2018-02-22 LAB — CBC
HCT: 29.9 % — ABNORMAL LOW (ref 39.0–52.0)
Hemoglobin: 10 g/dL — ABNORMAL LOW (ref 13.0–17.0)
MCH: 31.2 pg (ref 26.0–34.0)
MCHC: 33.4 g/dL (ref 30.0–36.0)
MCV: 93.1 fL (ref 78.0–100.0)
Platelets: 136 10*3/uL — ABNORMAL LOW (ref 150–400)
RBC: 3.21 MIL/uL — ABNORMAL LOW (ref 4.22–5.81)
RDW: 13.7 % (ref 11.5–15.5)
WBC: 9.5 10*3/uL (ref 4.0–10.5)

## 2018-02-22 NOTE — Progress Notes (Signed)
Occupational Therapy Treatment Patient Details Name: Ronnie Jackson MRN: [id number] DOB: December 27, 1935 Today's Date: 02/22/2018    History of present illness 82 YO s/p R THA anterior approach on 7/31. PMH of peripheral neuropathy, pelvic and thigh arthropathy, DM II, spinal stenosis, RA, prostate cancer, HTN, depression, and afib.    OT comments  Met with pt lying supine in bed, agreeable to OT this date. Supine <> sit completed with min A x2 (one for BLE management, one for postural management) and increased time. Sit <> stand completed with min A x2, improving to min A x1. Toilet t/f practiced, pt more successful with 3:1 commode frame over toilet. Attempted with grab bar, still successful but increased time needed. Pt reports preference of commode frame this date for BR that is closer to living room (does not have grab bar). BR further away equipped with grab bar. Educated pt on days of increased pain ,3:1 may be used to keep nearby common areas for less travel and increased toilet safety. Introduced and practiced ADL equipment (sock aide, reacher, long handled sponge). Pt attempted sock aide with min A, then reporting his wife does this for him at baseline and does not plan to change this practice. Pt interested in acquiring long handled reacher for LB dressing and long handled sponge to increase LB bathing independence. Further education given on acquiring shower seat for walk in shower. Pt will benefit from continued acute OT treatment for continued ADL practice.  Follow Up Recommendations  Supervision/Assistance - 24 hour    Equipment Recommendations  3 in 1 bedside commode;Other (comment);Tub/shower seat(reacher and long handled sponge)    Recommendations for Other Services      Precautions / Restrictions Precautions Precautions: Fall Restrictions Weight Bearing Restrictions: No Other Position/Activity Restrictions: WBAT        Mobility Bed Mobility Overal bed mobility: Needs  Assistance Bed Mobility: Supine to Sit     Supine to sit: +2 for physical assistance;Min assist     General bed mobility comments: min A needed for BLE management, VC's for leg placement and to not rush  Transfers Overall transfer level: Needs assistance Equipment used: Rolling walker (2 wheeled) Transfers: Sit to/from Stand Sit to Stand: Min assist;+2 physical assistance         General transfer comment: initially min A +2, improved to min A +1    Balance Overall balance assessment: Mild deficits observed, not formally tested                                         ADL either performed or assessed with clinical judgement   ADL Overall ADL's : Needs assistance/impaired Eating/Feeding: Independent   Grooming: Set up;Sitting   Upper Body Bathing: Set up;Sitting   Lower Body Bathing: Moderate assistance;Sit to/from stand   Upper Body Dressing : Minimal assistance;Sitting   Lower Body Dressing: Total assistance;Sit to/from stand   Toilet Transfer: Minimal assistance;RW;Comfort height toilet Toilet Transfer Details (indicate cue type and reason): using BSC over toilet as frame, more successful than utilizing grab bar Toileting- Clothing Manipulation and Hygiene: Minimal assistance;Sit to/from Nurse, children's Details (indicate cue type and reason): anticipate min A using shower chair         Vision Baseline Vision/History: Wears glasses Wears Glasses: Reading only Patient Visual Report: No change from baseline     Perception  Praxis      Cognition Arousal/Alertness: Awake/alert Behavior During Therapy: WFL for tasks assessed/performed Overall Cognitive Status: Within Functional Limits for tasks assessed                                 General Comments: HOH         Exercises     Shoulder Instructions       General Comments      Pertinent Vitals/ Pain       Pain Assessment: 0-10 Pain Score: 2   Pain Location: R hip  Pain Descriptors / Indicators: Operative site guarding Pain Intervention(s): Limited activity within patient's tolerance;Monitored during session;Premedicated before session;Repositioned  Home Living                                          Prior Functioning/Environment              Frequency  Min 2X/week        Progress Toward Goals  OT Goals(current goals can now be found in the care plan section)  Progress towards OT goals: Progressing toward goals  Acute Rehab OT Goals Patient Stated Goal: less pain; return to independence OT Goal Formulation: With patient Time For Goal Achievement: 03/07/18 Potential to Achieve Goals: Good  Plan Discharge plan remains appropriate    Co-evaluation    PT/OT/SLP Co-Evaluation/Treatment: Yes Reason for Co-Treatment: To address functional/ADL transfers;For patient/therapist safety PT goals addressed during session: Strengthening/ROM OT goals addressed during session: ADL's and self-care      AM-PAC PT "6 Clicks" Daily Activity     Outcome Measure   Help from another person eating meals?: None Help from another person taking care of personal grooming?: A Little Help from another person toileting, which includes using toliet, bedpan, or urinal?: A Little Help from another person bathing (including washing, rinsing, drying)?: A Lot Help from another person to put on and taking off regular upper body clothing?: A Little Help from another person to put on and taking off regular lower body clothing?: Total 6 Click Score: 16    End of Session Equipment Utilized During Treatment: Gait belt;Rolling walker  OT Visit Diagnosis: Pain;Other abnormalities of gait and mobility (R26.89) Pain - Right/Left: Right Pain - part of body: Hip   Activity Tolerance Patient tolerated treatment well   Patient Left in chair;with call bell/phone within reach;with chair alarm set   Nurse Communication           Time: 1753-0104 OT Time Calculation (min): 25 min  Charges: OT General Charges $OT Visit: 1 Visit OT Treatments $Self Care/Home Management : 8-22 mins   Zenovia Jarred, [place], OTR/L [phone]  Zenovia Jarred 02/22/2018, 11:32 AM

## 2018-02-22 NOTE — Discharge Summary (Signed)
Discharge Diagnoses:  Principal Problem:   Unilateral primary osteoarthritis, right hip Active Problems:   Status post total replacement of right hip   Surgeries: Procedure(s): RIGHT TOTAL HIP ARTHROPLASTY ANTERIOR APPROACH on 02/20/2018    Consultants:   Discharged Condition: Improved  Hospital Course: Ronnie Jackson is an 82 y.o. male who was admitted 02/20/2018 with a chief complaint of right hip pain, with a final diagnosis of osteoarthritis right hip.  Patient was brought to the operating room on 02/20/2018 and underwent Procedure(s): RIGHT TOTAL HIP ARTHROPLASTY ANTERIOR APPROACH.    Patient was given perioperative antibiotics:  Anti-infectives (From admission, onward)   Start     Dose/Rate Route Frequency Ordered Stop   02/21/18 1000  sulfamethoxazole-trimethoprim (BACTRIM DS,SEPTRA DS) 800-160 MG per tablet 1 tablet    Note to Pharmacy:  To control infection     1 tablet Oral Daily 02/20/18 1422     02/20/18 1700  ceFAZolin (ANCEF) IVPB 1 g/50 mL premix     1 g 100 mL/hr over 30 Minutes Intravenous Every 6 hours 02/20/18 1422 02/20/18 2253   02/20/18 0800  ceFAZolin (ANCEF) IVPB 2g/100 mL premix     2 g 200 mL/hr over 30 Minutes Intravenous On call to O.R. 02/20/18 3220 02/20/18 1118    .  Patient was given sequential compression devices, early ambulation, and aspirin for DVT prophylaxis.  Recent vital signs:  Patient Vitals for the past 24 hrs:  BP Temp Temp src Pulse Resp SpO2  02/22/18 0511 (!) 128/95 (!) 101.8 F (38.8 C) Oral 83 16 95 %  02/21/18 2044 136/66 100.1 F (37.8 C) Oral (!) 106 16 100 %  02/21/18 1812 116/64 99.3 F (37.4 C) Oral 94 18 100 %  02/21/18 1332 135/88 98.4 F (36.9 C) Oral 97 16 99 %  .  Recent laboratory studies: Dg Pelvis Portable  Result Date: 02/20/2018 CLINICAL DATA:  Status post right hip replacement EXAM: PORTABLE PELVIS 1-2 VIEWS COMPARISON:  Intraoperative films from earlier in the same day. FINDINGS: Right hip prosthesis is  noted in satisfactory position. No acute bony or soft tissue abnormality is noted. IMPRESSION: Status post right hip prosthesis Electronically Signed   By: Inez Catalina M.D.   On: 02/20/2018 13:03   Dg C-arm 1-60 Min-no Report  Result Date: 02/20/2018 Fluoroscopy was utilized by the requesting physician.  No radiographic interpretation.   Dg Hip Operative Unilat W Or W/o Pelvis Right  Result Date: 02/20/2018 CLINICAL DATA:  Total right hip replacement. EXAM: OPERATIVE RIGHT HIP (WITH PELVIS IF PERFORMED) 11 VIEWS TECHNIQUE: Fluoroscopic spot image(s) were submitted for interpretation post-operatively. COMPARISON:  09/12/2017. FINDINGS: Total right knee replacement. Hardware intact. Anatomic alignment. Surgical clips in the pelvis. IMPRESSION: Right knee replacement with anatomic alignment. Electronically Signed   By: Marcello Moores  Register   On: 02/20/2018 12:29    Discharge Medications:   Allergies as of 02/22/2018      Reactions   Indocin [indomethacin]    Pt unaware   Methadone    Pt unaware      Medication List    STOP taking these medications   morphine 30 MG tablet Commonly known as:  MSIR     TAKE these medications   apixaban 5 MG Tabs tablet Commonly known as:  ELIQUIS TAKE 1 TABLET BY MOUTH TWICE DAILY TO HELP PREVENT STROKE RELATED TO ATRIAL FIBRILLATION   cromolyn 4 % ophthalmic solution Commonly known as:  OPTICROM Place 1 drop into both eyes 4 (four) times  daily.   furosemide 40 MG tablet Commonly known as:  LASIX Take 40 mg by mouth 2 (two) times daily.   ketotifen 0.025 % ophthalmic solution Commonly known as:  ZADITOR Place 1 drop into both eyes 4 (four) times daily. ALAWAY   oxyCODONE-acetaminophen 5-325 MG tablet Commonly known as:  PERCOCET Take 1-2 tablets by mouth every 4 (four) hours as needed for severe pain.   sulfamethoxazole-trimethoprim 800-160 MG tablet Commonly known as:  BACTRIM DS,SEPTRA DS Take 1 tablet by mouth 2 (two) times daily. To control  infection What changed:    when to take this  additional instructions            Durable Medical Equipment  (From admission, onward)        Start     Ordered   02/20/18 1423  DME 3 n 1  Once     02/20/18 1422   02/20/18 1423  DME Walker rolling  Once    Question:  Patient needs a walker to treat with the following condition  Answer:  Status post total replacement of right hip   02/20/18 1422      Diagnostic Studies: Dg Pelvis Portable  Result Date: 02/20/2018 CLINICAL DATA:  Status post right hip replacement EXAM: PORTABLE PELVIS 1-2 VIEWS COMPARISON:  Intraoperative films from earlier in the same day. FINDINGS: Right hip prosthesis is noted in satisfactory position. No acute bony or soft tissue abnormality is noted. IMPRESSION: Status post right hip prosthesis Electronically Signed   By: Inez Catalina M.D.   On: 02/20/2018 13:03   Dg C-arm 1-60 Min-no Report  Result Date: 02/20/2018 Fluoroscopy was utilized by the requesting physician.  No radiographic interpretation.   Dg Hip Operative Unilat W Or W/o Pelvis Right  Result Date: 02/20/2018 CLINICAL DATA:  Total right hip replacement. EXAM: OPERATIVE RIGHT HIP (WITH PELVIS IF PERFORMED) 11 VIEWS TECHNIQUE: Fluoroscopic spot image(s) were submitted for interpretation post-operatively. COMPARISON:  09/12/2017. FINDINGS: Total right knee replacement. Hardware intact. Anatomic alignment. Surgical clips in the pelvis. IMPRESSION: Right knee replacement with anatomic alignment. Electronically Signed   By: Marcello Moores  Register   On: 02/20/2018 12:29    Patient benefited maximally from their hospital stay and there were no complications.     Disposition: Discharge disposition: 01-Home or Self Care      Discharge Instructions    Call MD / Call 911   Complete by:  As directed    If you experience chest pain or shortness of breath, CALL 911 and be transported to the hospital emergency room.  If you develope a fever above 101 F, pus  (white drainage) or increased drainage or redness at the wound, or calf pain, call your surgeon's office.   Constipation Prevention   Complete by:  As directed    Drink plenty of fluids.  Prune juice may be helpful.  You may use a stool softener, such as Colace (over the counter) 100 mg twice a day.  Use MiraLax (over the counter) for constipation as needed.   Diet - low sodium heart healthy   Complete by:  As directed    Increase activity slowly as tolerated   Complete by:  As directed      Follow-up Information    Mcarthur Rossetti, MD. Schedule an appointment as soon as possible for a visit in 2 week(s).   Specialty:  Orthopedic Surgery Contact information: [place] [address] [phone]        Home,  [facility] Follow up.   Specialty:  [place] Why:  physical therapy Contact information: [place] [address] [phone]            Signed: Newt Minion 02/22/2018, 11:56 AM

## 2018-02-22 NOTE — Progress Notes (Signed)
Physical Therapy Treatment Patient Details Name: Ronnie Jackson MRN: [id number] DOB: Jan 16, 1936 Today's Date: 02/22/2018    History of Present Illness 82 YO s/p R THA anterior approach on 7/31. PMH of peripheral neuropathy, pelvic and thigh arthropathy, DM II, spinal stenosis, RA, prostate cancer, HTN, depression, and afib.     PT Comments    Pt very cooperative but anxious regarding pain level experienced last two days and requiring encouragement to progress.  Marked improvement in activity tolerance this am.   Follow Up Recommendations  Home health PT     Equipment Recommendations  None recommended by PT    Recommendations for Other Services       Precautions / Restrictions Precautions Precautions: Fall Restrictions Weight Bearing Restrictions: No Other Position/Activity Restrictions: WBAT     Mobility  Bed Mobility Overal bed mobility: Needs Assistance Bed Mobility: Supine to Sit     Supine to sit: +2 for physical assistance;Min assist     General bed mobility comments: Cues for sequence and to move slowly for control.  Physical assist to manage R LE, to bring trunk to upright   Transfers Overall transfer level: Needs assistance Equipment used: Rolling walker (2 wheeled) Transfers: Sit to/from Stand Sit to Stand: Min assist;+2 physical assistance         General transfer comment: initially min A +2, improved to min A +1; cues for safe transition position and for use of UEs to self assist.  Ambulation/Gait Ambulation/Gait assistance: Min assist;+2 safety/equipment Gait Distance (Feet): 147 Feet(and 15' into bathroom) Assistive device: Rolling walker (2 wheeled) Gait Pattern/deviations: Decreased stance time - right;Antalgic;Trunk flexed;Step-to pattern;Step-through pattern Gait velocity: decreased    General Gait Details: Cues for posture, position from RW, step length and sequence.  Initial ltd WB tolerance R LE with mild buckling at knee   Stairs              Wheelchair Mobility    Modified Rankin (Stroke Patients Only)       Balance Overall balance assessment: Mild deficits observed, not formally tested                                          Cognition Arousal/Alertness: Awake/alert Behavior During Therapy: WFL for tasks assessed/performed Overall Cognitive Status: Within Functional Limits for tasks assessed                                 General Comments: HOH       Exercises      General Comments        Pertinent Vitals/Pain Pain Assessment: 0-10 Pain Score: 2  Pain Location: R hip  Pain Descriptors / Indicators: Operative site guarding Pain Intervention(s): Limited activity within patient's tolerance;Monitored during session;Premedicated before session(pt declines ice)    Home Living                      Prior Function            PT Goals (current goals can now be found in the care plan section) Acute Rehab PT Goals Patient Stated Goal: less pain; return to independence PT Goal Formulation: With patient Time For Goal Achievement: 02/21/18 Potential to Achieve Goals: Good Progress towards PT goals: Progressing toward goals    Frequency    7X/week  PT Plan Current plan remains appropriate    Co-evaluation PT/OT/SLP Co-Evaluation/Treatment: Yes Reason for Co-Treatment: To address functional/ADL transfers;For patient/therapist safety PT goals addressed during session: Strengthening/ROM OT goals addressed during session: ADL's and self-care      AM-PAC PT "6 Clicks" Daily Activity  Outcome Measure  Difficulty turning over in bed (including adjusting bedclothes, sheets and blankets)?: Unable Difficulty moving from lying on back to sitting on the side of the bed? : Unable Difficulty sitting down on and standing up from a chair with arms (e.g., wheelchair, bedside commode, etc,.)?: Unable Help needed moving to and from a bed to chair (including  a wheelchair)?: A Little Help needed walking in hospital room?: A Lot Help needed climbing 3-5 steps with a railing? : A Lot 6 Click Score: 10    End of Session Equipment Utilized During Treatment: Gait belt Activity Tolerance: Patient tolerated treatment well Patient left: in chair;with call bell/phone within reach;Other (comment)(OT in room) Nurse Communication: Mobility status PT Visit Diagnosis: Other abnormalities of gait and mobility (R26.89);Difficulty in walking, not elsewhere classified (R26.2)     Time: 1308-6578 PT Time Calculation (min) (ACUTE ONLY): 24 min  Charges:  $Gait Training: 8-22 mins                     Pg [phone]    Tyrihanna Wingert 02/22/2018, 11:45 AM

## 2018-02-22 NOTE — Progress Notes (Signed)
Physical Therapy Treatment Patient Details Name: Ronnie Jackson MRN: [id number] DOB: January 05, 1936 Today's Date: 02/22/2018    History of Present Illness 82 YO s/p R THA anterior approach on 7/31. PMH of peripheral neuropathy, pelvic and thigh arthropathy, DM II, spinal stenosis, RA, prostate cancer, HTN, depression, and afib.     PT Comments    Patient progressing with mobility and able to get up with min A and walk with mostly S level assist.  Wife concerned about going home and not having 24 hour assist.  Patient would need to be settled in seated position with needs in reach prior to her leaving, but educated okay if he is left for an hour at a time.  Also educated on level of assist for transfers and ambulation.  Feel can return home safely with wife assist, but plan to practice with her and pt tomorrow for one step for home entry, [name] transfer and level of assistance.    Follow Up Recommendations  Home health PT     Equipment Recommendations  None recommended by PT    Recommendations for Other Services       Precautions / Restrictions Precautions Precautions: Fall Restrictions Weight Bearing Restrictions: No Other Position/Activity Restrictions: WBAT     Mobility  Bed Mobility               General bed mobility comments: up in chair (wife reports will sleep in chair at home)  Transfers Overall transfer level: Needs assistance Equipment used: Rolling walker (2 wheeled) Transfers: Sit to/from Stand Sit to Stand: Min assist         General transfer comment: assist to steady as pt moving hands from chair to walker  Ambulation/Gait Ambulation/Gait assistance: Supervision;Min guard Gait Distance (Feet): 120 Feet Assistive device: Rolling walker (2 wheeled) Gait Pattern/deviations: Step-to pattern;Decreased stride length;Antalgic;Trunk flexed     General Gait Details: cues for posture, improved R LE progressing with increased distance   Stairs              Wheelchair Mobility    Modified Rankin (Stroke Patients Only)       Balance Overall balance assessment: Needs assistance   Sitting balance-Leahy Scale: Good     Standing balance support: Bilateral upper extremity supported Standing balance-Leahy Scale: Poor Standing balance comment: UE support needed for balance                            Cognition Arousal/Alertness: Awake/alert Behavior During Therapy: WFL for tasks assessed/performed Overall Cognitive Status: Within Functional Limits for tasks assessed                                 General Comments: HOH       Exercises Total Joint Exercises Ankle Circles/Pumps: AROM;10 reps;Both;Seated Quad Sets: 10 reps;Both;Seated;AROM Heel Slides: AAROM;Right;10 reps;Seated Hip ABduction/ADduction: AAROM;Supine;Right;15 reps Long Arc Quad: AROM;AAROM;10 reps;Right;Seated    General Comments General comments (skin integrity, edema, etc.): wife expressing concerns about pt being left alone during the day and she cannot be there 24 hours.  discussed length of time he may be left alone.  States would not be 2 hours at a time. discussed making sure needs are met and keeping him in chair with needs in reach while she is out as pt not needing lifting help from chair; discussed steadying help from chair and plans to practice one step  for home entry and [name] transfer for transport home.        Pertinent Vitals/Pain Pain Assessment: Faces Pain Score: 3  Faces Pain Scale: Hurts even more Pain Location: R hip with sitting up in chair Pain Descriptors / Indicators: Operative site guarding;Grimacing Pain Intervention(s): Monitored during session;Repositioned    Home Living                      Prior Function            PT Goals (current goals can now be found in the care plan section) Acute Rehab PT Goals Patient Stated Goal: less pain; return to independence PT Goal Formulation: With  patient Time For Goal Achievement: 02/21/18 Potential to Achieve Goals: Good Progress towards PT goals: Progressing toward goals    Frequency    7X/week      PT Plan Current plan remains appropriate    Co-evaluation              AM-PAC PT "6 Clicks" Daily Activity  Outcome Measure  Difficulty turning over in bed (including adjusting bedclothes, sheets and blankets)?: Unable Difficulty moving from lying on back to sitting on the side of the bed? : Unable Difficulty sitting down on and standing up from a chair with arms (e.g., wheelchair, bedside commode, etc,.)?: Unable Help needed moving to and from a bed to chair (including a wheelchair)?: A Little Help needed walking in hospital room?: A Little Help needed climbing 3-5 steps with a railing? : A Lot 6 Click Score: 11    End of Session Equipment Utilized During Treatment: Gait belt Activity Tolerance: Patient tolerated treatment well Patient left: in chair;with call bell/phone within reach;with family/visitor present Nurse Communication: Mobility status PT Visit Diagnosis: Other abnormalities of gait and mobility (R26.89);Difficulty in walking, not elsewhere classified (R26.2)     Time: 2585-2778 PT Time Calculation (min) (ACUTE ONLY): 27 min  Charges:  $Gait Training: 8-22 mins $Therapeutic Exercise: 8-22 mins                     [place], [place] [phone] 02/22/2018    Reginia Naas 02/22/2018, 3:29 PM

## 2018-02-22 NOTE — Progress Notes (Signed)
Physical Therapy Treatment Patient Details Name: Ronnie Jackson MRN: [id number] DOB: 12/04/1935 Today's Date: 02/22/2018    History of Present Illness 82 YO s/p R THA anterior approach on 7/31. PMH of peripheral neuropathy, pelvic and thigh arthropathy, DM II, spinal stenosis, RA, prostate cancer, HTN, depression, and afib.     PT Comments    Pt performed therex program - requiring multiple rests to complete task but with marked decrease in pain level vs previous attempt.   Follow Up Recommendations  Home health PT     Equipment Recommendations  None recommended by PT    Recommendations for Other Services       Precautions / Restrictions Precautions Precautions: Fall Restrictions Weight Bearing Restrictions: No Other Position/Activity Restrictions: WBAT     Mobility  Bed Mobility Overal bed mobility: Needs Assistance Bed Mobility: Supine to Sit     Supine to sit: +2 for physical assistance;Min assist     General bed mobility comments: Cues for sequence and to move slowly for control.  Physical assist to manage R LE, to bring trunk to upright   Transfers Overall transfer level: Needs assistance Equipment used: Rolling walker (2 wheeled) Transfers: Sit to/from Stand Sit to Stand: Min assist;+2 physical assistance         General transfer comment: initially min A +2, improved to min A +1; cues for safe transition position and for use of UEs to self assist.  Ambulation/Gait Ambulation/Gait assistance: Min assist;+2 safety/equipment Gait Distance (Feet): 147 Feet(and 15' into bathroom) Assistive device: Rolling walker (2 wheeled) Gait Pattern/deviations: Decreased stance time - right;Antalgic;Trunk flexed;Step-to pattern;Step-through pattern Gait velocity: decreased    General Gait Details: Cues for posture, position from RW, step length and sequence.  Initial ltd WB tolerance R LE with mild buckling at knee   Stairs             Wheelchair Mobility     Modified Rankin (Stroke Patients Only)       Balance Overall balance assessment: Mild deficits observed, not formally tested                                          Cognition Arousal/Alertness: Awake/alert Behavior During Therapy: WFL for tasks assessed/performed Overall Cognitive Status: Within Functional Limits for tasks assessed                                 General Comments: [facility]       Exercises Total Joint Exercises Ankle Circles/Pumps: AROM;20 reps;Supine;Both Quad Sets: AROM;Right;10 reps;Supine Heel Slides: AAROM;Right;Supine;20 reps Hip ABduction/ADduction: AAROM;Supine;Right;15 reps    General Comments        Pertinent Vitals/Pain Pain Assessment: 0-10 Pain Score: 3  Pain Location: R hip  Pain Descriptors / Indicators: Operative site guarding Pain Intervention(s): Limited activity within patient's tolerance;Monitored during session;Premedicated before session(pt declines ice)    Home Living                      Prior Function            PT Goals (current goals can now be found in the care plan section) Acute Rehab PT Goals Patient Stated Goal: less pain; return to independence PT Goal Formulation: With patient Time For Goal Achievement: 02/21/18 Potential to Achieve Goals: Good Progress towards PT goals:  Progressing toward goals    Frequency    7X/week      PT Plan Current plan remains appropriate    Co-evaluation PT/OT/SLP Co-Evaluation/Treatment: Yes Reason for Co-Treatment: To address functional/ADL transfers;For patient/therapist safety PT goals addressed during session: Strengthening/ROM OT goals addressed during session: ADL's and self-care      AM-PAC PT "6 Clicks" Daily Activity  Outcome Measure  Difficulty turning over in bed (including adjusting bedclothes, sheets and blankets)?: Unable Difficulty moving from lying on back to sitting on the side of the bed? : Unable Difficulty  sitting down on and standing up from a chair with arms (e.g., wheelchair, bedside commode, etc,.)?: Unable Help needed moving to and from a bed to chair (including a wheelchair)?: A Little Help needed walking in hospital room?: A Lot Help needed climbing 3-5 steps with a railing? : A Lot 6 Click Score: 10    End of Session Equipment Utilized During Treatment: Gait belt Activity Tolerance: Patient tolerated treatment well Patient left: in chair;with call bell/phone within reach;Other (comment) Nurse Communication: Mobility status PT Visit Diagnosis: Other abnormalities of gait and mobility (R26.89);Difficulty in walking, not elsewhere classified (R26.2)     Time: 1041-1101 PT Time Calculation (min) (ACUTE ONLY): 20 min  Charges:  $Gait Training: 8-22 mins $Therapeutic Exercise: 8-22 mins                     Pg [phone]    Aviona Martenson 02/22/2018, 11:49 AM

## 2018-02-22 NOTE — Progress Notes (Signed)
Discharge discontinued due to patient having difficulty getting in and out bed/chair per physical therapist. PT feels the patient needs more time with therapy.

## 2018-02-23 LAB — CBC
HCT: 32.8 % — ABNORMAL LOW (ref 39.0–52.0)
Hemoglobin: 10.8 g/dL — ABNORMAL LOW (ref 13.0–17.0)
MCH: 30.7 pg (ref 26.0–34.0)
MCHC: 32.9 g/dL (ref 30.0–36.0)
MCV: 93.2 fL (ref 78.0–100.0)
Platelets: 152 10*3/uL (ref 150–400)
RBC: 3.52 MIL/uL — ABNORMAL LOW (ref 4.22–5.81)
RDW: 13.8 % (ref 11.5–15.5)
WBC: 10 10*3/uL (ref 4.0–10.5)

## 2018-02-23 NOTE — Progress Notes (Signed)
Pt stable at time of d/c with family. D/c education and instructions given without complication. RN reinforced that pt did not need to take Morphine and Percocet together. This was an md order. Case manager called to give updated phone number for home health to call and come out to see pt.

## 2018-02-23 NOTE — Progress Notes (Signed)
Message sent to [place] with updated number for son, Octavia Bruckner # [phone]. Jonnie Finner RN CCM Case Mgmt phone [phone]

## 2018-02-23 NOTE — Progress Notes (Signed)
Patient requested IV be discontinued due to pain in hand. IV DC'd. Donne Hazel, RN

## 2018-02-23 NOTE — Progress Notes (Signed)
Physical Therapy Treatment Patient Details Name: Ronnie Jackson MRN: [id number] DOB: 1936-03-13 Today's Date: 02/23/2018    History of Present Illness 82 YO s/p R THA anterior approach on 7/31. PMH of peripheral neuropathy, pelvic and thigh arthropathy, DM II, spinal stenosis, RA, prostate cancer, HTN, depression, and afib.     PT Comments    Patient progressing with balance and safety.  Still needing assist with exercises at times.  Continue to feel he can go home with family support (even intermittent) and follow up HHPT.    Follow Up Recommendations  Home health PT     Equipment Recommendations  None recommended by PT    Recommendations for Other Services       Precautions / Restrictions Precautions Precautions: Fall Restrictions Weight Bearing Restrictions: No    Mobility  Bed Mobility               General bed mobility comments: up in chair  Transfers Overall transfer level: Needs assistance Equipment used: Rolling walker (2 wheeled) Transfers: Sit to/from Stand Sit to Stand: Min assist         General transfer comment: steadying assist and cues for hands  Ambulation/Gait Ambulation/Gait assistance: Supervision;Min guard Gait Distance (Feet): 145 Feet Assistive device: Rolling walker (2 wheeled) Gait Pattern/deviations: Step-to pattern;Decreased stride length;Antalgic;Trunk flexed     General Gait Details: reminded himself to look forward and demonstrating good balance even with turning and with environmental scanning and backing up to chair, still sits with uncontrolled descent   Stairs             Wheelchair Mobility    Modified Rankin (Stroke Patients Only)       Balance Overall balance assessment: Needs assistance   Sitting balance-Leahy Scale: Good     Standing balance support: Bilateral upper extremity supported Standing balance-Leahy Scale: Poor                              Cognition Arousal/Alertness:  Awake/alert Behavior During Therapy: WFL for tasks assessed/performed Overall Cognitive Status: Within Functional Limits for tasks assessed                                        Exercises Total Joint Exercises Ankle Circles/Pumps: AROM;10 reps;Both;Seated Quad Sets: 10 reps;Both;Seated;AROM Short Arc Quad: Right;Supine;AAROM;5 reps Heel Slides: AAROM;Right;10 reps;Seated Hip ABduction/ADduction: AAROM;Supine;Right;15 reps Long Arc Quad: AROM;AAROM;10 reps;Right;Seated    General Comments        Pertinent Vitals/Pain Faces Pain Scale: Hurts little more Pain Location: R hip with heel slides Pain Descriptors / Indicators: Operative site guarding;Grimacing Pain Intervention(s): Monitored during session;Repositioned    Home Living                      Prior Function            PT Goals (current goals can now be found in the care plan section) Acute Rehab PT Goals Patient Stated Goal: less pain; return to independence PT Goal Formulation: With patient Time For Goal Achievement: 02/25/18 Potential to Achieve Goals: Good Progress towards PT goals: Progressing toward goals    Frequency    7X/week      PT Plan Current plan remains appropriate    Co-evaluation              AM-PAC PT "6  Clicks" Daily Activity  Outcome Measure  Difficulty turning over in bed (including adjusting bedclothes, sheets and blankets)?: Unable Difficulty moving from lying on back to sitting on the side of the bed? : Unable Difficulty sitting down on and standing up from a chair with arms (e.g., wheelchair, bedside commode, etc,.)?: A Lot Help needed moving to and from a bed to chair (including a wheelchair)?: A Little Help needed walking in hospital room?: A Little Help needed climbing 3-5 steps with a railing? : A Lot 6 Click Score: 12    End of Session Equipment Utilized During Treatment: Gait belt Activity Tolerance: Patient tolerated treatment  well Patient left: with call bell/phone within reach;in chair   PT Visit Diagnosis: Other abnormalities of gait and mobility (R26.89);Difficulty in walking, not elsewhere classified (R26.2)     Time: 0601-5615 PT Time Calculation (min) (ACUTE ONLY): 31 min  Charges:  $Gait Training: 8-22 mins $Therapeutic Exercise: 8-22 mins                     [place], [place] [phone] 02/23/2018    Reginia Naas 02/23/2018, 12:23 PM

## 2018-02-23 NOTE — Plan of Care (Signed)
Pt to be d/c'ed today. Rn will assess needs for equipment.

## 2018-02-23 NOTE — Discharge Summary (Signed)
Discharge Diagnoses:  Principal Problem:   Unilateral primary osteoarthritis, right hip Active Problems:   Status post total replacement of right hip   Surgeries: Procedure(s): RIGHT TOTAL HIP ARTHROPLASTY ANTERIOR APPROACH on 02/20/2018    Consultants:   Discharged Condition: Improved  Hospital Course: Ronnie Jackson is an 82 y.o. male who was admitted 02/20/2018 with a chief complaint of right hip pain, with a final diagnosis of osteoarthritis right hip.  Patient was brought to the operating room on 02/20/2018 and underwent Procedure(s): RIGHT TOTAL HIP ARTHROPLASTY ANTERIOR APPROACH.    Patient was given perioperative antibiotics:  Anti-infectives (From admission, onward)   Start     Dose/Rate Route Frequency Ordered Stop   02/21/18 1000  sulfamethoxazole-trimethoprim (BACTRIM DS,SEPTRA DS) 800-160 MG per tablet 1 tablet    Note to Pharmacy:  To control infection     1 tablet Oral Daily 02/20/18 1422     02/20/18 1700  ceFAZolin (ANCEF) IVPB 1 g/50 mL premix     1 g 100 mL/hr over 30 Minutes Intravenous Every 6 hours 02/20/18 1422 02/20/18 2253   02/20/18 0800  ceFAZolin (ANCEF) IVPB 2g/100 mL premix     2 g 200 mL/hr over 30 Minutes Intravenous On call to O.R. 02/20/18 [phone] 02/20/18 1118    .  Patient was given sequential compression devices, early ambulation, and aspirin for DVT prophylaxis.  Recent vital signs:  Patient Vitals for the past 24 hrs:  BP Temp Temp src Pulse Resp SpO2  02/23/18 0628 105/60 98.7 F (37.1 C) Oral 74 16 99 %  02/22/18 1900 139/74 98.7 F (37.1 C) Oral 68 17 100 %  02/22/18 1309 (!) 141/81 98.6 F (37 C) Oral 73 18 99 %  .  Recent laboratory studies: No results found.  Discharge Medications:   Allergies as of 02/23/2018      Reactions   Indocin [indomethacin]    Pt unaware   Methadone    Pt unaware      Medication List    STOP taking these medications   morphine 30 MG tablet Commonly known as:  MSIR     TAKE these medications    apixaban 5 MG Tabs tablet Commonly known as:  ELIQUIS TAKE 1 TABLET BY MOUTH TWICE DAILY TO HELP PREVENT STROKE RELATED TO ATRIAL FIBRILLATION   cromolyn 4 % ophthalmic solution Commonly known as:  OPTICROM Place 1 drop into both eyes 4 (four) times daily.   furosemide 40 MG tablet Commonly known as:  LASIX Take 40 mg by mouth 2 (two) times daily.   ketotifen 0.025 % ophthalmic solution Commonly known as:  ZADITOR Place 1 drop into both eyes 4 (four) times daily. ALAWAY   oxyCODONE-acetaminophen 5-325 MG tablet Commonly known as:  PERCOCET Take 1-2 tablets by mouth every 4 (four) hours as needed for severe pain.   sulfamethoxazole-trimethoprim 800-160 MG tablet Commonly known as:  BACTRIM DS,SEPTRA DS Take 1 tablet by mouth 2 (two) times daily. To control infection What changed:    when to take this  additional instructions            Durable Medical Equipment  (From admission, onward)        Start     Ordered   02/20/18 1423  DME 3 n 1  Once     02/20/18 1422   02/20/18 1423  DME Walker rolling  Once    Question:  Patient needs a walker to treat with the following condition  Answer:  Status post total replacement of right hip   02/20/18 1422      Diagnostic Studies: Dg Pelvis Portable  Result Date: 02/20/2018 CLINICAL DATA:  Status post right hip replacement EXAM: PORTABLE PELVIS 1-2 VIEWS COMPARISON:  Intraoperative films from earlier in the same day. FINDINGS: Right hip prosthesis is noted in satisfactory position. No acute bony or soft tissue abnormality is noted. IMPRESSION: Status post right hip prosthesis Electronically Signed   By: Inez Catalina M.D.   On: 02/20/2018 13:03   Dg C-arm 1-60 Min-no Report  Result Date: 02/20/2018 Fluoroscopy was utilized by the requesting physician.  No radiographic interpretation.   Dg Hip Operative Unilat W Or W/o Pelvis Right  Result Date: 02/20/2018 CLINICAL DATA:  Total right hip replacement. EXAM: OPERATIVE RIGHT HIP  (WITH PELVIS IF PERFORMED) 11 VIEWS TECHNIQUE: Fluoroscopic spot image(s) were submitted for interpretation post-operatively. COMPARISON:  09/12/2017. FINDINGS: Total right knee replacement. Hardware intact. Anatomic alignment. Surgical clips in the pelvis. IMPRESSION: Right knee replacement with anatomic alignment. Electronically Signed   By: Marcello Moores  Register   On: 02/20/2018 12:29    Patient benefited maximally from their hospital stay and there were no complications.     Disposition: Discharge disposition: 01-Home or Self Care      Discharge Instructions    Call MD / Call 911   Complete by:  As directed    If you experience chest pain or shortness of breath, CALL 911 and be transported to the hospital emergency room.  If you develope a fever above 101 F, pus (white drainage) or increased drainage or redness at the wound, or calf pain, call your surgeon's office.   Call MD / Call 911   Complete by:  As directed    If you experience chest pain or shortness of breath, CALL 911 and be transported to the hospital emergency room.  If you develope a fever above 101 F, pus (white drainage) or increased drainage or redness at the wound, or calf pain, call your surgeon's office.   Constipation Prevention   Complete by:  As directed    Drink plenty of fluids.  Prune juice may be helpful.  You may use a stool softener, such as Colace (over the counter) 100 mg twice a day.  Use MiraLax (over the counter) for constipation as needed.   Constipation Prevention   Complete by:  As directed    Drink plenty of fluids.  Prune juice may be helpful.  You may use a stool softener, such as Colace (over the counter) 100 mg twice a day.  Use MiraLax (over the counter) for constipation as needed.   Diet - low sodium heart healthy   Complete by:  As directed    Diet - low sodium heart healthy   Complete by:  As directed    Increase activity slowly as tolerated   Complete by:  As directed    Increase activity  slowly as tolerated   Complete by:  As directed      Follow-up Information    Mcarthur Rossetti, MD. Schedule an appointment as soon as possible for a visit in 2 week(s).   Specialty:  Orthopedic Surgery Contact information: [place] [address] [phone]        [facility] Follow up.   Specialty:  [place] Why:  physical therapy Contact information: [place] [address] [phone]  Signed: Newt Minion 02/23/2018, 10:25 AM

## 2018-02-24 ENCOUNTER — Telehealth (INDEPENDENT_AMBULATORY_CARE_PROVIDER_SITE_OTHER): Payer: Self-pay | Admitting: Orthopaedic Surgery

## 2018-02-24 DIAGNOSIS — F17200 Nicotine dependence, unspecified, uncomplicated: Secondary | ICD-10-CM | POA: Diagnosis not present

## 2018-02-24 DIAGNOSIS — I4891 Unspecified atrial fibrillation: Secondary | ICD-10-CM | POA: Diagnosis not present

## 2018-02-24 DIAGNOSIS — M4802 Spinal stenosis, cervical region: Secondary | ICD-10-CM | POA: Diagnosis not present

## 2018-02-24 DIAGNOSIS — F329 Major depressive disorder, single episode, unspecified: Secondary | ICD-10-CM | POA: Diagnosis not present

## 2018-02-24 DIAGNOSIS — G4700 Insomnia, unspecified: Secondary | ICD-10-CM | POA: Diagnosis not present

## 2018-02-24 DIAGNOSIS — M059 Rheumatoid arthritis with rheumatoid factor, unspecified: Secondary | ICD-10-CM | POA: Diagnosis not present

## 2018-02-24 DIAGNOSIS — G894 Chronic pain syndrome: Secondary | ICD-10-CM | POA: Diagnosis not present

## 2018-02-24 DIAGNOSIS — M5030 Other cervical disc degeneration, unspecified cervical region: Secondary | ICD-10-CM | POA: Diagnosis not present

## 2018-02-24 DIAGNOSIS — F419 Anxiety disorder, unspecified: Secondary | ICD-10-CM | POA: Diagnosis not present

## 2018-02-24 DIAGNOSIS — R1314 Dysphagia, pharyngoesophageal phase: Secondary | ICD-10-CM | POA: Diagnosis not present

## 2018-02-24 DIAGNOSIS — I10 Essential (primary) hypertension: Secondary | ICD-10-CM | POA: Diagnosis not present

## 2018-02-24 DIAGNOSIS — E1142 Type 2 diabetes mellitus with diabetic polyneuropathy: Secondary | ICD-10-CM | POA: Diagnosis not present

## 2018-02-24 DIAGNOSIS — Z471 Aftercare following joint replacement surgery: Secondary | ICD-10-CM | POA: Diagnosis not present

## 2018-02-24 MED FILL — OXYCODONE-ACETAMINOPHEN 5-3: 5-325 | 5 days supply | Qty: 60 | Fill #0

## 2018-02-24 NOTE — Telephone Encounter (Signed)
Debbie-(PT) from [facility] called needing verbal orders for 2 wk 1 and 3 wk 2. Debbie said she started (PT) today. The number to contact Jackelyn Poling is [phone]

## 2018-02-24 NOTE — Telephone Encounter (Signed)
Verbal order left on VM  

## 2018-02-27 ENCOUNTER — Other Ambulatory Visit: Payer: Self-pay

## 2018-02-27 NOTE — Patient Outreach (Signed)
[facility]) Care Management  [place]   02/27/2018  Ronnie Jackson Nov 07, 1935 [id number]   82 year old male outreached by [place] services for a discharge medication review.  PMHx includes, but not limited to, atrial fibrillation, Type 2 diabetes mellitus, rheumatoid arthritis, h/o prostate cancer and hyperlipidemia.   Successful outreach attempt to Ronnie Jackson wife, Ronnie Jackson ([place]).  HIPAA identifiers verified.   Subjective: Ronnie Jackson states that Ronnie Jackson is a little "slow" after his recent hip replacement.  She states that he is up and about and working with PT.  She reports that he has "borderline diabetes that is diet controlled" and that he has never been on a statin.   Objective:  SCr 0.99 mg/dL on 02/21/18 HgA1c 6.4% on 02/19/18 Cholesterol 135 mg/dL, HDL 36 mg/dL, LDL 83 mg/dL  on 01/08/18  Current Medications: Current Outpatient Medications  Medication Sig Dispense Refill  . apixaban (ELIQUIS) 5 MG TABS tablet TAKE 1 TABLET BY MOUTH TWICE DAILY TO HELP PREVENT STROKE RELATED TO ATRIAL FIBRILLATION 60 tablet 11  . cromolyn (OPTICROM) 4 % ophthalmic solution Place 1 drop into both eyes 4 (four) times daily. 10 mL 5  . furosemide (LASIX) 40 MG tablet Take 40 mg by mouth 2 (two) times daily.    Marland Kitchen ketotifen (ZADITOR) 0.025 % ophthalmic solution Place 1 drop into both eyes 4 (four) times daily. ALAWAY Brand name    . oxyCODONE-acetaminophen (PERCOCET) 5-325 MG tablet Take 1-2 tablets by mouth every 4 (four) hours as needed for severe pain. 60 tablet 0  . sulfamethoxazole-trimethoprim (BACTRIM DS,SEPTRA DS) 800-160 MG tablet Take 1 tablet by mouth 2 (two) times daily. To control infection (Patient taking differently: Take 1 tablet by mouth 2 (two) times daily. To control infection, takes when he gets a UTI) 60 tablet 3   No current facility-administered medications for this visit.     Functional Status: In your present state of health, do you have any  difficulty performing the following activities: 02/20/2018 02/19/2018  Hearing? Y Y  Comment hearing aid Left ear hearing aids  Vision? N N  Difficulty concentrating or making decisions? Y Y  Comment - Mild memory deficit  Walking or climbing stairs? Y Y  Comment - Related to hip discomfort  Dressing or bathing? N N  Doing errands, shopping? N N  Preparing Food and eating ? - -  Using the Toilet? - -  In the past six months, have you accidently leaked urine? - -  Do you have problems with loss of bowel control? - -  Managing your Medications? - -  Managing your Finances? - -  Housekeeping or managing your Housekeeping? - -  Some recent data might be hidden    Fall/Depression Screening: Fall Risk  01/09/2018 11/20/2017 09/12/2017  Falls in the past year? Yes No No  Number falls in past yr: 1 - -  Injury with Fall? Yes - -   PHQ 2/9 Scores 11/20/2017 09/12/2017 04/18/2017 11/15/2016 02/08/2016 12/22/2014 11/02/2014  PHQ - 2 Score 0 0 0 0 0 0 0  PHQ- 9 Score - - - - - - -   ASSESSMENT: Date Discharged from Hospital: 02/23/18 Date Medication Reconciliation Performed: 02/27/2018  Medications Discontinued at Discharge:   Morphine IR  New Medications at Discharge:  [place]  Patient was recently discharged from hospital and all medications have been reviewed  Drugs sorted by system:  Cardiovascular: apixaban, furosemide,   Topical: cromolyn opthalmic , ketotifen opthalmic  Pain: oxycodone/APAP  Infectious Diseases: sulfamethoxazole/trimethoprim  Gaps in therapy:  Patient with Type 2 diabetes mellitus and not on a statin.   Other issues noted:  Ketotifen ophthalmic- maximum dose is not to exceed two applications/day  Ronnie Jackson states that she has no further medication questions or concerns.  PLAN: Route note to PCP, Dr. Mariea Clonts.   Joetta Manners, PharmD Clinical Pharmacist [place] [phone]

## 2018-02-28 ENCOUNTER — Other Ambulatory Visit: Payer: Self-pay | Admitting: *Deleted

## 2018-02-28 NOTE — Patient Outreach (Signed)
[place] [facility]) Care Management  02/28/2018  Ronnie Jackson 07/18/1936 [id number]  Referral via RED Alert-EMMI-General Discharge; Day # 1; 02/25/2018: Reason: Scheduled follow up-no:  Telephone call to patient; spouse answered call & advised that she answers all of patient's calls because of hearing difficulty. States she took all of his EMMI calls also. HIPPA verification received.   Spouse/caregiver voices that patient does have follow up appointment  scheduled with orthopedic office on 8/13. States he does have transportation to appointment. Spouse was advised of importance of touching base with primary care office regarding whether an appointment was needed for follow up with them. Caregiver voices understanding and will touch base with PCP office.  States she assists patient with managing medications & makes sure patient is taking as prescribed by his doctors.  Voices understanding of importance of medication compliance.   Voices patient does have home health physical therapy services and is currently using crutches to get around.   Reviewed reportable symptoms to MD and when to call 911. Caregiver/spouse voices understanding.   EMMI-Red Alert addressed. No further concerns.  Plan:  Case closure.  Sherrin Daisy, RN BSN [place] Management Coordinator [facility] Care Management  [phone]

## 2018-03-03 DIAGNOSIS — F329 Major depressive disorder, single episode, unspecified: Secondary | ICD-10-CM | POA: Diagnosis not present

## 2018-03-03 DIAGNOSIS — F17200 Nicotine dependence, unspecified, uncomplicated: Secondary | ICD-10-CM | POA: Diagnosis not present

## 2018-03-03 DIAGNOSIS — M059 Rheumatoid arthritis with rheumatoid factor, unspecified: Secondary | ICD-10-CM | POA: Diagnosis not present

## 2018-03-03 DIAGNOSIS — I4891 Unspecified atrial fibrillation: Secondary | ICD-10-CM | POA: Diagnosis not present

## 2018-03-03 DIAGNOSIS — Z471 Aftercare following joint replacement surgery: Secondary | ICD-10-CM | POA: Diagnosis not present

## 2018-03-03 DIAGNOSIS — M5030 Other cervical disc degeneration, unspecified cervical region: Secondary | ICD-10-CM | POA: Diagnosis not present

## 2018-03-03 DIAGNOSIS — M4802 Spinal stenosis, cervical region: Secondary | ICD-10-CM | POA: Diagnosis not present

## 2018-03-03 DIAGNOSIS — F419 Anxiety disorder, unspecified: Secondary | ICD-10-CM | POA: Diagnosis not present

## 2018-03-03 DIAGNOSIS — G4700 Insomnia, unspecified: Secondary | ICD-10-CM | POA: Diagnosis not present

## 2018-03-03 DIAGNOSIS — I10 Essential (primary) hypertension: Secondary | ICD-10-CM | POA: Diagnosis not present

## 2018-03-03 DIAGNOSIS — R1314 Dysphagia, pharyngoesophageal phase: Secondary | ICD-10-CM | POA: Diagnosis not present

## 2018-03-03 DIAGNOSIS — E1142 Type 2 diabetes mellitus with diabetic polyneuropathy: Secondary | ICD-10-CM | POA: Diagnosis not present

## 2018-03-03 DIAGNOSIS — G894 Chronic pain syndrome: Secondary | ICD-10-CM | POA: Diagnosis not present

## 2018-03-04 ENCOUNTER — Encounter (INDEPENDENT_AMBULATORY_CARE_PROVIDER_SITE_OTHER): Payer: Self-pay | Admitting: Physician Assistant

## 2018-03-04 ENCOUNTER — Ambulatory Visit (INDEPENDENT_AMBULATORY_CARE_PROVIDER_SITE_OTHER): Payer: PPO | Admitting: Physician Assistant

## 2018-03-04 DIAGNOSIS — Z96641 Presence of right artificial hip joint: Secondary | ICD-10-CM

## 2018-03-04 MED ORDER — METHOCARBAMOL 500 MG PO TABS: 500.0000 mg | ORAL_TABLET | Freq: Three times a day (TID) | ORAL | 0 refills | Status: DC | PRN

## 2018-03-04 MED ORDER — OXYCODONE-ACETAMINOPHEN 5-325 MG PO TABS: 1.0000 | ORAL_TABLET | ORAL | 0 refills | Status: DC | PRN

## 2018-03-04 MED FILL — OXYCODONE-ACETAMINOPHEN 5-3: 5-325 | 5 days supply | Qty: 60 | Fill #0

## 2018-03-04 MED FILL — METHOCARBAMOL 500 MG TABLET: 500 | 10 days supply | Qty: 30 | Fill #0

## 2018-03-04 NOTE — Progress Notes (Signed)
HPI: Ronnie Jackson returns now 12 days status post right total hip arthroplasty.  He is overall doing well.  He is states he has had some swelling in the right lower leg.  He is on Eliquis due to A. fib.  He has had no shortness of breath fevers chills.  States his swelling did go down the leg is now only in the ankle on the right.  Physical exam: Right hip surgical incisions well approximated with staples no signs of infection.  He does have abrasion of the most proximal end of the incision but no gross signs of infection or drainage.  Right calf supple nontender.  Edema right ankle.  Impression: Status post right total hip arthroplasty  Plan: He is given a refill on his oxycodone.  He is asking for a muscle relaxant is given Robaxin.  Staples removed Steri-Strips applied scar tissue mobilization encouraged.  We will have him apply a small amount of Bactroban to the proximal end of the incision after washing it with an antibacterial soap daily and drying it well.  Like to see him back in just a week just for wound check.  Questions were encouraged and answered at length.

## 2018-03-05 ENCOUNTER — Other Ambulatory Visit: Payer: Self-pay | Admitting: *Deleted

## 2018-03-05 NOTE — Patient Outreach (Signed)
[place] [facility]) Care Management  03/05/2018  Ronnie Jackson 1935-10-18 [id number]  Referral via EMMI-General Discharge-RED Alert-Day#1-8/6; Day#4, 02/28/2018 Reason: scheduled follow up-no Lost interest in things-yes  Per hx review: Admission-8/1-02/23/2018 Right hip replacement_anterior  Telephone call to patient,; son answered call and spoke with patient who was heard in the background. HIPPA verification received.  Son was advised of reason for call. States patient has a scheduled follow up appointment with surgeon and primary care provider. States also that patient has transportation to appointments.  Voices that patient is also receiving home physical therapy.  Voices patient states he is not depressed or sad. States he has no questions at this time.  EMMI Alert addressed.  Plan: Case closure.  Sherrin Daisy, RN BSN [place] Management Coordinator [facility] Care Management  [phone]

## 2018-03-10 DIAGNOSIS — M4802 Spinal stenosis, cervical region: Secondary | ICD-10-CM | POA: Diagnosis not present

## 2018-03-10 DIAGNOSIS — F17200 Nicotine dependence, unspecified, uncomplicated: Secondary | ICD-10-CM | POA: Diagnosis not present

## 2018-03-10 DIAGNOSIS — G4700 Insomnia, unspecified: Secondary | ICD-10-CM | POA: Diagnosis not present

## 2018-03-10 DIAGNOSIS — F419 Anxiety disorder, unspecified: Secondary | ICD-10-CM | POA: Diagnosis not present

## 2018-03-10 DIAGNOSIS — I4891 Unspecified atrial fibrillation: Secondary | ICD-10-CM | POA: Diagnosis not present

## 2018-03-10 DIAGNOSIS — E1142 Type 2 diabetes mellitus with diabetic polyneuropathy: Secondary | ICD-10-CM | POA: Diagnosis not present

## 2018-03-10 DIAGNOSIS — R1314 Dysphagia, pharyngoesophageal phase: Secondary | ICD-10-CM | POA: Diagnosis not present

## 2018-03-10 DIAGNOSIS — Z471 Aftercare following joint replacement surgery: Secondary | ICD-10-CM | POA: Diagnosis not present

## 2018-03-10 DIAGNOSIS — G894 Chronic pain syndrome: Secondary | ICD-10-CM | POA: Diagnosis not present

## 2018-03-10 DIAGNOSIS — F329 Major depressive disorder, single episode, unspecified: Secondary | ICD-10-CM | POA: Diagnosis not present

## 2018-03-10 DIAGNOSIS — M059 Rheumatoid arthritis with rheumatoid factor, unspecified: Secondary | ICD-10-CM | POA: Diagnosis not present

## 2018-03-10 DIAGNOSIS — M5030 Other cervical disc degeneration, unspecified cervical region: Secondary | ICD-10-CM | POA: Diagnosis not present

## 2018-03-10 DIAGNOSIS — I10 Essential (primary) hypertension: Secondary | ICD-10-CM | POA: Diagnosis not present

## 2018-03-11 ENCOUNTER — Encounter (INDEPENDENT_AMBULATORY_CARE_PROVIDER_SITE_OTHER): Payer: Self-pay | Admitting: Orthopaedic Surgery

## 2018-03-11 ENCOUNTER — Ambulatory Visit (INDEPENDENT_AMBULATORY_CARE_PROVIDER_SITE_OTHER): Payer: PPO | Admitting: Orthopaedic Surgery

## 2018-03-11 ENCOUNTER — Ambulatory Visit: Payer: PPO | Admitting: Cardiology

## 2018-03-11 DIAGNOSIS — Z96641 Presence of right artificial hip joint: Secondary | ICD-10-CM

## 2018-03-11 NOTE — Progress Notes (Signed)
The patient comes in today for wound check 19 days status post a right total hip arthroplasty.  Slight wound breakdown in the groin crease on his right hip.  He is doing well overall.  He is 82 years old.  He is getting around using crutches for balance.  He has no significant issues.  On exam his incision looks great.  There is a small little area of dehiscence this minimal.  There is no evidence of infection at all.  At this point continues wound care is is is doing so well.  We will see him back in 3 weeks as he is doing overall.  No x-rays needed at that visit.  He will still try intermittent ice and heat over his thigh swelling area.  This looks like is just a hematoma not a seroma.  There is no redness.  When he does run on pain medication he will give [place] a call we will send in some more.

## 2018-03-14 ENCOUNTER — Other Ambulatory Visit (INDEPENDENT_AMBULATORY_CARE_PROVIDER_SITE_OTHER): Payer: Self-pay | Admitting: Orthopaedic Surgery

## 2018-03-14 ENCOUNTER — Telehealth (INDEPENDENT_AMBULATORY_CARE_PROVIDER_SITE_OTHER): Payer: Self-pay

## 2018-03-14 ENCOUNTER — Telehealth (INDEPENDENT_AMBULATORY_CARE_PROVIDER_SITE_OTHER): Payer: Self-pay | Admitting: Orthopaedic Surgery

## 2018-03-14 MED ORDER — OXYCODONE-ACETAMINOPHEN 5-325 MG PO TABS: 1.0000 | ORAL_TABLET | ORAL | 0 refills | Status: DC | PRN

## 2018-03-14 MED ORDER — HYDROCODONE-ACETAMINOPHEN 10-325 MG PO TABS: 1.0000 | ORAL_TABLET | Freq: Three times a day (TID) | ORAL | 0 refills | Status: DC | PRN

## 2018-03-14 MED FILL — HYDROCODON-APAP 10-325: 10-325 | 13 days supply | Qty: 40 | Fill #0

## 2018-03-14 NOTE — Telephone Encounter (Signed)
Patients wife called requesting oxycodone for patient, he mentioned at his visit on 8/20 that he would be running out soon and it was never given to patient. He is completely out now. Please advise [name] # [phone]

## 2018-03-14 NOTE — Telephone Encounter (Signed)
To [employer]

## 2018-03-14 NOTE — Telephone Encounter (Signed)
Please advise 

## 2018-03-14 NOTE — Telephone Encounter (Signed)
I just escribed it in.

## 2018-03-14 NOTE — Telephone Encounter (Signed)
Patient wife called and stated that [facility] still does not have this RX. Advised her waiting on MD response(approval)  She stated that Pharmacy closes at 6:00 and closed weekends. Advised we were aware.  She requested a call one called in.

## 2018-03-14 NOTE — Telephone Encounter (Signed)
Patients wife came into clinic this afternoon stating that her husbands medication needed to be refilled today as he was out and the pharmacy they use closes at 6pm and they are not open on the weekend. I spoke with Jeneen Rinks since he was the only provider left in the office and he ok'd Norco 10/325 1 po q 8hrs prn #40. rx given to patients wife.

## 2018-04-02 ENCOUNTER — Other Ambulatory Visit (INDEPENDENT_AMBULATORY_CARE_PROVIDER_SITE_OTHER): Payer: Self-pay | Admitting: Orthopaedic Surgery

## 2018-04-02 ENCOUNTER — Ambulatory Visit (INDEPENDENT_AMBULATORY_CARE_PROVIDER_SITE_OTHER): Payer: PPO | Admitting: Orthopaedic Surgery

## 2018-04-02 ENCOUNTER — Encounter (INDEPENDENT_AMBULATORY_CARE_PROVIDER_SITE_OTHER): Payer: Self-pay | Admitting: Orthopaedic Surgery

## 2018-04-02 DIAGNOSIS — Z96641 Presence of right artificial hip joint: Secondary | ICD-10-CM

## 2018-04-02 MED ORDER — HYDROCODONE-ACETAMINOPHEN 10-325 MG PO TABS: 1.0000 | ORAL_TABLET | Freq: Three times a day (TID) | ORAL | 0 refills | Status: DC | PRN

## 2018-04-02 MED FILL — OXYCODONE-ACETAMINOPHEN 5-3: 5-325 | 5 days supply | Qty: 50 | Fill #0

## 2018-04-02 NOTE — Progress Notes (Signed)
The patient is 6 weeks status post a right total hip arthroplasty.  He states to look sort he is ambulate with a crutch but is doing his home exercises and makes sure that he is being active daily.  His wife said he is getting out of the house more now and they feel like he is making progress.  On examination his right hip his incision is healed over.  There is still some swelling but no evidence of infection at all.  He tolerates me putting his hip the range of motion as well.  This point continue to increase his activities as he tolerates.  I would like to see him back in 4 weeks to make sure he is doing fine.  If he looks good at that visit we will see him back for 6 months.

## 2018-04-10 MED FILL — SULFAMETHOXAZOLE-TMP DS TAB: 800-160 | 30 days supply | Qty: 60 | Fill #1

## 2018-04-21 ENCOUNTER — Other Ambulatory Visit: Payer: Self-pay | Admitting: *Deleted

## 2018-04-21 NOTE — Telephone Encounter (Signed)
Patient wife, [name] called and left message on Clinical intake requesting refill on patient's Morphine 30mg .   Medication has been removed from patient's chart due to Orthopaedic Dr. Ninfa Linden giving patient Hydrocodone after hip replacement surgery.   NCCSRS Database Verified Oxycodone 5-325 Refill #50 (5 day supply) given on 9/11  Hydrocodone 10-325 Refill #40 (13 days supply) given on 8/23  Oxycodone 5-325 Refill # 60 (5 day supply) given on 03/04/18  Oxycodone 5-325 Refill #60 (5 day supply) given on 02/24/18  Morphine Sulfate IR 30mg  Refill #120 (30 day supply) given on 01/24/18 Our office filled.  Tried calling Patient. Received information from [place]  Please Advise

## 2018-04-21 NOTE — Telephone Encounter (Signed)
.  left message to have patient return my call.  

## 2018-04-21 NOTE — Telephone Encounter (Signed)
Pt should be getting pain meds through Dr. Ninfa Linden only at this point (unless he has completed his visits with him after his surgery?).  He's been taken off morphine.

## 2018-04-22 ENCOUNTER — Other Ambulatory Visit (INDEPENDENT_AMBULATORY_CARE_PROVIDER_SITE_OTHER): Payer: Self-pay | Admitting: Orthopaedic Surgery

## 2018-04-22 ENCOUNTER — Telehealth (INDEPENDENT_AMBULATORY_CARE_PROVIDER_SITE_OTHER): Payer: Self-pay | Admitting: Orthopaedic Surgery

## 2018-04-22 LAB — CBC WITH DIFFERENTIAL/PLATELET
Basophils Absolute: 7 cells/uL (ref 0–200)
Basophils Relative: 0.1 %
Eosinophils Absolute: 51 cells/uL (ref 15–500)
Eosinophils Relative: 0.7 %
HCT: 36.7 % — ABNORMAL LOW (ref 38.5–50.0)
Hemoglobin: 12.3 g/dL — ABNORMAL LOW (ref 13.2–17.1)
Lymphs Abs: 2088 cells/uL (ref 850–3900)
MCH: 30.1 pg (ref 27.0–33.0)
MCHC: 33.5 g/dL (ref 32.0–36.0)
MCV: 89.7 fL (ref 80.0–100.0)
MPV: 11.3 fL (ref 7.5–12.5)
Monocytes Relative: 6.6 %
Neutro Abs: 4672 cells/uL (ref 1500–7800)
Neutrophils Relative %: 64 %
Platelets: 182 10*3/uL (ref 140–400)
RBC: 4.09 10*6/uL — ABNORMAL LOW (ref 4.20–5.80)
RDW: 13.6 % (ref 11.0–15.0)
Total Lymphocyte: 28.6 %
WBC mixed population: 482 cells/uL (ref 200–950)
WBC: 7.3 10*3/uL (ref 3.8–10.8)

## 2018-04-22 LAB — COMPLETE METABOLIC PANEL WITH GFR
AG Ratio: 1.8 (calc) (ref 1.0–2.5)
ALT: 11 U/L (ref 9–46)
AST: 19 U/L (ref 10–35)
Albumin: 4.3 g/dL (ref 3.6–5.1)
Alkaline phosphatase (APISO): 83 U/L (ref 40–115)
BUN/Creatinine Ratio: 12 (calc) (ref 6–22)
BUN: 16 mg/dL (ref 7–25)
CO2: 28 mmol/L (ref 20–32)
Calcium: 8.6 mg/dL (ref 8.6–10.3)
Chloride: 101 mmol/L (ref 98–110)
Creat: 1.36 mg/dL — ABNORMAL HIGH (ref 0.70–1.11)
GFR, Est African American: 57 mL/min/{1.73_m2} — ABNORMAL LOW (ref >=60)
GFR, Est Non African American: 49 mL/min/{1.73_m2} — ABNORMAL LOW (ref >=60)
Globulin: 2.4 g/dL (calc) (ref 1.9–3.7)
Glucose, Bld: 105 mg/dL — ABNORMAL HIGH (ref 65–99)
Potassium: 4 mmol/L (ref 3.5–5.3)
Sodium: 138 mmol/L (ref 135–146)
Total Bilirubin: 1 mg/dL (ref 0.2–1.2)
Total Protein: 6.7 g/dL (ref 6.1–8.1)

## 2018-04-22 LAB — LIPID PANEL
Cholesterol: 135 mg/dL (ref <200)
HDL: 36 mg/dL — ABNORMAL LOW (ref >40)
LDL Cholesterol (Calc): 83 mg/dL (calc)
Non-HDL Cholesterol (Calc): 99 mg/dL (calc) (ref <130)
Total CHOL/HDL Ratio: 3.8 (calc) (ref <5.0)
Triglycerides: 75 mg/dL (ref <150)

## 2018-04-22 LAB — HEMOGLOBIN A1C
Hgb A1c MFr Bld: 6.4 % of total Hgb — ABNORMAL HIGH (ref <5.7)
Mean Plasma Glucose: 137 (calc)
eAG (mmol/L): 7.6 (calc)

## 2018-04-22 MED ORDER — OXYCODONE-ACETAMINOPHEN 5-325 MG PO TABS: 1.0000 | ORAL_TABLET | Freq: Four times a day (QID) | ORAL | 0 refills | Status: DC | PRN

## 2018-04-22 MED FILL — OXYCODONE-ACETAMINOPHEN 5-3: 5-325 | 5 days supply | Qty: 40 | Fill #0

## 2018-04-22 NOTE — Telephone Encounter (Signed)
He will have to get morphine from whoever prescribed it to him for "years".  This is not a medication I prescribed at all.  He is now 2 months out from surgery and I will provide Percocet only this 1 last time.

## 2018-04-22 NOTE — Telephone Encounter (Signed)
Please advise 

## 2018-04-22 NOTE — Telephone Encounter (Signed)
Discussed Dr.Reed's response with Mrs.Owens Shark. Mrs.Soulliere verbalized understanding and plans to call Dr.Blackman.

## 2018-04-22 NOTE — Telephone Encounter (Signed)
Patient aware.

## 2018-04-22 NOTE — Telephone Encounter (Signed)
Pharmacy  [facility]    Medication refill   Oxycodone-acetamiophen(Percocet) 5-325 tablet    [name] called to place medication refill for her husband. [name] stated the medcine still has him in pain would like morphine that he has taken for years. I did advise patient wife that the oxycodone is the medication listed on patient chart. [name] had surgery on the 1st of August right total hip arthroplasty anterior approach. Please call to discuss patient medication, patient is currently out.

## 2018-04-30 ENCOUNTER — Ambulatory Visit (INDEPENDENT_AMBULATORY_CARE_PROVIDER_SITE_OTHER): Payer: PPO | Admitting: Orthopaedic Surgery

## 2018-04-30 ENCOUNTER — Encounter (INDEPENDENT_AMBULATORY_CARE_PROVIDER_SITE_OTHER): Payer: Self-pay | Admitting: Orthopaedic Surgery

## 2018-04-30 DIAGNOSIS — Z96641 Presence of right artificial hip joint: Secondary | ICD-10-CM

## 2018-04-30 MED ORDER — OXYCODONE-ACETAMINOPHEN 5-325 MG PO TABS: 1.0000 | ORAL_TABLET | Freq: Four times a day (QID) | ORAL | 0 refills | Status: DC | PRN

## 2018-04-30 MED FILL — OXYCODONE-ACETAMINOPHEN 5-3: 5-325 | 5 days supply | Qty: 40 | Fill #0

## 2018-04-30 NOTE — Progress Notes (Signed)
The patient is now 70 days status post a right total hip arthroplasty.  He is 82 years old he is doing well overall.  This time lives with a crutch on occasion.  He says his pain remains at night or if his first getting up but overall much better than what he did before surgery.  On exam I can put his hip more easily through range of motion of the right side.  His incisions healed nicely.  There is on some slight swelling.  He will continue increase his activities as tolerates.  I will send him in one more prescription for oxycodone and he should wean it from there and he understands this.  I do not need to see him back for 6 months.  At that visit I like a standing low AP pelvis and lateral of his right operative hip.

## 2018-05-02 MED FILL — FUROSEMIDE 40 MG TAB: 40 | 30 days supply | Qty: 60 | Fill #1

## 2018-05-08 ENCOUNTER — Other Ambulatory Visit: Payer: PPO

## 2018-05-08 DIAGNOSIS — E1142 Type 2 diabetes mellitus with diabetic polyneuropathy: Secondary | ICD-10-CM | POA: Diagnosis not present

## 2018-05-08 DIAGNOSIS — E7800 Pure hypercholesterolemia, unspecified: Secondary | ICD-10-CM

## 2018-05-08 DIAGNOSIS — I4819 Other persistent atrial fibrillation: Secondary | ICD-10-CM

## 2018-05-09 LAB — COMPLETE METABOLIC PANEL WITH GFR
AG Ratio: 1.8 (calc) (ref 1.0–2.5)
ALT: 10 U/L (ref 9–46)
AST: 18 U/L (ref 10–35)
Albumin: 4.3 g/dL (ref 3.6–5.1)
Alkaline phosphatase (APISO): 80 U/L (ref 40–115)
BUN/Creatinine Ratio: 11 (calc) (ref 6–22)
BUN: 13 mg/dL (ref 7–25)
CO2: 32 mmol/L (ref 20–32)
Calcium: 8.6 mg/dL (ref 8.6–10.3)
Chloride: 100 mmol/L (ref 98–110)
Creat: 1.22 mg/dL — ABNORMAL HIGH (ref 0.70–1.11)
GFR, Est African American: 64 mL/min/{1.73_m2} (ref >=60)
GFR, Est Non African American: 56 mL/min/{1.73_m2} — ABNORMAL LOW (ref >=60)
Globulin: 2.4 g/dL (calc) (ref 1.9–3.7)
Glucose, Bld: 96 mg/dL (ref 65–99)
Potassium: 4.1 mmol/L (ref 3.5–5.3)
Sodium: 141 mmol/L (ref 135–146)
Total Bilirubin: 0.7 mg/dL (ref 0.2–1.2)
Total Protein: 6.7 g/dL (ref 6.1–8.1)

## 2018-05-09 LAB — CBC WITH DIFFERENTIAL/PLATELET
Basophils Absolute: 29 cells/uL (ref 0–200)
Basophils Relative: 0.4 %
Eosinophils Absolute: 234 cells/uL (ref 15–500)
Eosinophils Relative: 3.2 %
HCT: 36.4 % — ABNORMAL LOW (ref 38.5–50.0)
Hemoglobin: 12.2 g/dL — ABNORMAL LOW (ref 13.2–17.1)
Lymphs Abs: 2526 cells/uL (ref 850–3900)
MCH: 30.6 pg (ref 27.0–33.0)
MCHC: 33.5 g/dL (ref 32.0–36.0)
MCV: 91.2 fL (ref 80.0–100.0)
MPV: 11.6 fL (ref 7.5–12.5)
Monocytes Relative: 8.1 %
Neutro Abs: 3920 cells/uL (ref 1500–7800)
Neutrophils Relative %: 53.7 %
Platelets: 163 10*3/uL (ref 140–400)
RBC: 3.99 10*6/uL — ABNORMAL LOW (ref 4.20–5.80)
RDW: 13.6 % (ref 11.0–15.0)
Total Lymphocyte: 34.6 %
WBC mixed population: 591 cells/uL (ref 200–950)
WBC: 7.3 10*3/uL (ref 3.8–10.8)

## 2018-05-09 LAB — MICROALBUMIN / CREATININE URINE RATIO
Creatinine, Urine: 120 mg/dL (ref 20–320)
Microalb Creat Ratio: 17 mcg/mg creat (ref <30)
Microalb, Ur: 2 mg/dL

## 2018-05-09 LAB — HEMOGLOBIN A1C
Hgb A1c MFr Bld: 6.1 % of total Hgb — ABNORMAL HIGH (ref <5.7)
Mean Plasma Glucose: 128 (calc)
eAG (mmol/L): 7.1 (calc)

## 2018-05-09 LAB — LIPID PANEL
Cholesterol: 153 mg/dL (ref <200)
HDL: 39 mg/dL — ABNORMAL LOW (ref >40)
LDL Cholesterol (Calc): 93 mg/dL (calc)
Non-HDL Cholesterol (Calc): 114 mg/dL (calc) (ref <130)
Total CHOL/HDL Ratio: 3.9 (calc) (ref <5.0)
Triglycerides: 111 mg/dL (ref <150)

## 2018-05-12 ENCOUNTER — Encounter: Payer: Self-pay | Admitting: Internal Medicine

## 2018-05-12 ENCOUNTER — Ambulatory Visit (INDEPENDENT_AMBULATORY_CARE_PROVIDER_SITE_OTHER): Payer: PPO | Admitting: Internal Medicine

## 2018-05-12 VITALS — BP 158/80 | HR 96 | Temp 97.9°F | Ht 70.0 in | Wt 221.0 lb

## 2018-05-12 DIAGNOSIS — Z96641 Presence of right artificial hip joint: Secondary | ICD-10-CM | POA: Diagnosis not present

## 2018-05-12 DIAGNOSIS — M4820 Kissing spine, site unspecified: Secondary | ICD-10-CM | POA: Diagnosis not present

## 2018-05-12 DIAGNOSIS — E7800 Pure hypercholesterolemia, unspecified: Secondary | ICD-10-CM

## 2018-05-12 DIAGNOSIS — M1611 Unilateral primary osteoarthritis, right hip: Secondary | ICD-10-CM

## 2018-05-12 DIAGNOSIS — E1142 Type 2 diabetes mellitus with diabetic polyneuropathy: Secondary | ICD-10-CM

## 2018-05-12 DIAGNOSIS — Z23 Encounter for immunization: Secondary | ICD-10-CM | POA: Diagnosis not present

## 2018-05-12 MED ORDER — CELECOXIB 100 MG PO CAPS: 100.0000 mg | ORAL_CAPSULE | Freq: Every day | ORAL | 3 refills | Status: DC

## 2018-05-12 MED FILL — CELECOXIB 100 MG CAP: 100 | 30 days supply | Qty: 30 | Fill #0

## 2018-05-12 NOTE — Progress Notes (Signed)
Location:  [facility] clinic Provider:  Chealsey Miyamoto L. Mariea Clonts, D.O., C.M.D.  Code Status: full code Goals of Care:  Advanced Directives 02/20/2018  Does Patient Have a Medical Advance Directive? No  Would patient like information on creating a medical advance directive? No - Patient declined   Chief Complaint  Patient presents with  . Medical Management of Chronic Issues    5mth follow-up    HPI: Patient is a 82 y.o. male seen today for medical management of chronic diseases.    Pt immediately requesting pain medication.  Had been tapered down to norco after hip surgery.  Had been on morphine with Dr. Nyoka Cowden for years before that, but now had his hip replaced so should have improved pain control.  Had therapy.  Uses his crutch to get around.  Is able to walk on it.   Surgery was on 02/20/18 with Dr. Rema Jasmine approach.  It's been over 2 mos.    Says he's hurting all over since his surgery.  He aches all over.  Hip still hurts, but hands, legs.  Hands ache so bad he can't even hardly drive.    hba1c improved from 6.4 to 6.1.  Has laid off on the sleeves of cookies and sodas.    LDL has trended up to 93 from 83.    Still getting swelling from his afib.    Hearing remains terrible.  He says he cannot afford it.    Past Medical History:  Diagnosis Date  . Anxiety state, unspecified   . Atrial fibrillation ([place]) 03/06/2016  . Chronic pain syndrome   . Constipation   . Depressive disorder, not elsewhere classified   . Dysphagia, unspecified(787.20)   . Herpes simplex disease   . Hyperlipidemia   . Hypertension   . Hypopotassemia   . Insomnia, unspecified   . Intestinovesical fistula   . Malignant neoplasm of prostate ([place])   . Myalgia and myositis, unspecified   . Osteoarthritis   . Pain in joint, lower leg    Bilateral knee pains  . Reflux esophagitis   . Rheumatoid arthritis with rheumatoid factor (HCC)   . Spinal stenosis, unspecified region other than cervical   .  Thrombocytopenia, unspecified ([place])   . Type II or unspecified type diabetes mellitus without mention of complication, not stated as uncontrolled    Pt states that it is prediabetic  . Unspecified arthropathy, pelvic region and thigh   . Unspecified hereditary and idiopathic peripheral neuropathy     Past Surgical History:  Procedure Laterality Date  . CARDIAC CATHETERIZATION  01/31/2004   Dr Gwenlyn Found  . CARDIOVERSION N/A 07/12/2016   Procedure: CARDIOVERSION;  Surgeon: Pixie Casino, MD;  Location: [place];  Service: Cardiovascular;  Laterality: N/A;  . COLONOSCOPY  08/09/2006   Dr Christian Mate, hemorrhoids/rectal fistula  . cystocopy  07/2006   Dr Harlow Asa  . NASAL SINUS SURGERY  1992  . PROSTATE SURGERY  1991  . TOTAL HIP ARTHROPLASTY Right 02/20/2018   Procedure: RIGHT TOTAL HIP ARTHROPLASTY ANTERIOR APPROACH;  Surgeon: Mcarthur Rossetti, MD;  Location: [facility];  Service: Orthopedics;  Laterality: Right;    No Known Allergies  Outpatient Encounter Medications as of 05/12/2018  Medication Sig  . apixaban (ELIQUIS) 5 MG TABS tablet TAKE 1 TABLET BY MOUTH TWICE DAILY TO HELP PREVENT STROKE RELATED TO ATRIAL FIBRILLATION  . cromolyn (OPTICROM) 4 % ophthalmic solution Place 1 drop into both eyes 4 (four) times daily.  . furosemide (LASIX) 40 MG tablet  Take 40 mg by mouth 2 (two) times daily.  Marland Kitchen sulfamethoxazole-trimethoprim (BACTRIM DS,SEPTRA DS) 800-160 MG tablet Take 1 tablet by mouth as needed (for UTI).  . [DISCONTINUED] HYDROcodone-acetaminophen (NORCO) 10-325 MG tablet Take 1 tablet by mouth every 8 (eight) hours as needed.  . [DISCONTINUED] ketotifen (ZADITOR) 0.025 % ophthalmic solution Place 1 drop into both eyes 4 (four) times daily. ALAWAY Brand name  . [DISCONTINUED] methocarbamol (ROBAXIN) 500 MG tablet Take 1 tablet (500 mg total) by mouth every 8 (eight) hours as needed for muscle spasms.  . [DISCONTINUED] oxyCODONE-acetaminophen (PERCOCET) 5-325 MG tablet Take 1-2  tablets by mouth every 6 (six) hours as needed for severe pain.  . [DISCONTINUED] sulfamethoxazole-trimethoprim (BACTRIM DS,SEPTRA DS) 800-160 MG tablet Take 1 tablet by mouth 2 (two) times daily. To control infection (Patient taking differently: Take 1 tablet by mouth 2 (two) times daily. To control infection, takes when he gets a UTI)   No facility-administered encounter medications on file as of 05/12/2018.     Review of Systems:  Review of Systems  Constitutional: Negative for chills and fever.  HENT: Positive for hearing loss.        Still does not have hearing aids  Eyes: Negative for blurred vision.  Respiratory: Negative for cough and shortness of breath.   Cardiovascular: Positive for leg swelling. Negative for chest pain and palpitations.       B/c he skipped his lasix for a couple of days--bp also high as a result  Gastrointestinal: Negative for abdominal pain, blood in stool, constipation and melena.  Genitourinary: Negative for dysuria.  Musculoskeletal: Positive for back pain and joint pain. Negative for falls, myalgias and neck pain.  Skin: Negative for itching and rash.  Neurological: Negative for dizziness and loss of consciousness.  Endo/Heme/Allergies: Does not bruise/bleed easily.  Psychiatric/Behavioral: Negative for depression and memory loss. The patient does not have insomnia.     Health Maintenance  Topic Date Due  . FOOT EXAM  12/22/2015  . OPHTHALMOLOGY EXAM  01/15/2018  . INFLUENZA VACCINE  02/20/2018  . TETANUS/TDAP  11/01/2024 (Originally 08/02/1954)  . HEMOGLOBIN A1C  11/07/2018  . URINE MICROALBUMIN  05/09/2019  . PNA vac Low Risk Adult  Completed    Physical Exam: Vitals:   05/12/18 1519  BP: (!) 158/80  Pulse: 96  Temp: 97.9 F (36.6 C)  TempSrc: Oral  SpO2: 97%  Weight: 221 lb (100.2 kg)  Height: 5\' 10"  (1.778 m)   Body mass index is 31.71 kg/m. Physical Exam  Constitutional: He is oriented to person, place, and time. He appears  well-developed and well-nourished. No distress.  HENT:  Very HOH  Cardiovascular: Normal rate, regular rhythm, normal heart sounds and intact distal pulses.  Pulmonary/Chest: Effort normal and breath sounds normal. No respiratory distress.  Abdominal: Bowel sounds are normal.  Musculoskeletal: Normal range of motion. He exhibits tenderness.  Of lower lumbar spine and over mcps bilateral hands; tender over lateral hip, using crutch to support him as he walks  Neurological: He is alert and oriented to person, place, and time.  Skin: Skin is warm and dry.  Psychiatric: He has a normal mood and affect.    Labs reviewed: Basic Metabolic Panel: Recent Labs    02/19/18 0930 02/21/18 0536 05/08/18 0912  NA 142 139 141  K 4.4 4.0 4.1  CL 101 102 100  CO2 31 28 32  GLUCOSE 111* 206* 96  BUN 15 15 13   CREATININE 1.34* 0.99 1.22*  CALCIUM  9.2 8.6* 8.6   Liver Function Tests: Recent Labs    09/12/17 1015 01/08/18 1110 05/08/18 0912  AST 18 19 18   ALT 12 11 10   BILITOT 0.9 1.0 0.7  PROT 6.8 6.7 6.7   No results for input(s): LIPASE, AMYLASE in the last 8760 hours. No results for input(s): AMMONIA in the last 8760 hours. CBC: Recent Labs    09/12/17 1015 01/08/18 1110  02/22/18 0546 02/23/18 0426 05/08/18 0912  WBC 6.1 7.3   < > 9.5 10.0 7.3  NEUTROABS 3,709 4,672  --   --   --  3,920  HGB 12.8* 12.3*   < > 10.0* 10.8* 12.2*  HCT 36.9* 36.7*   < > 29.9* 32.8* 36.4*  MCV 89.1 89.7   < > 93.1 93.2 91.2  PLT 143 182   < > 136* 152 163   < > = values in this interval not displayed.   Lipid Panel: Recent Labs    09/12/17 1015 01/08/18 1110 05/08/18 0912  CHOL 144 135 153  HDL 35* 36* 39*  LDLCALC 89 83 93  TRIG 102 75 111  CHOLHDL 4.1 3.8 3.9   Lab Results  Component Value Date   HGBA1C 6.1 (H) 05/08/2018    Assessment/Plan 1. Primary osteoarthritis of right hip -s/p right TKA - celecoxib (CELEBREX) 100 MG capsule; Take 1 capsule (100 mg total) by mouth  daily. With breakfast  Dispense: 30 capsule; Refill: 3  2. S/P hip replacement, right - trying to get him entirely off opioids--he's already been tapered from morphine 60 to 30 then to oxycodone and now to norco for his opioids - has generalized arthritis pain - will add celebrex daily WITH FOOD - celecoxib (CELEBREX) 100 MG capsule; Take 1 capsule (100 mg total) by mouth daily. With breakfast  Dispense: 30 capsule; Refill: 3 -also encouraged him to use [place] dream cream for his hand pain topically  3. Localized idiopathic skeletal hyperostosis - celecoxib (CELEBREX) 100 MG capsule; Take 1 capsule (100 mg total) by mouth daily. With breakfast  Dispense: 30 capsule; Refill: 3  4. DM type 2 with diabetic peripheral neuropathy (HCC) - hba1c has improved -cont to improve diet and lose weight (off soda and cookies) - CBC with Differential/Platelet; Future - COMPLETE METABOLIC PANEL WITH GFR; Future - Hemoglobin A1c; Future  5. Pure hypercholesterolemia -cont to work on improving diet,  Not on cholesterol medication and has been resistant to this  6. Need for influenza vaccination - Flu vaccine HIGH DOSE PF (Fluzone High dose)  Labs/tests ordered:   Orders Placed This Encounter  Procedures  . Flu vaccine HIGH DOSE PF (Fluzone High dose)  . CBC with Differential/Platelet    Standing Status:   Future    Standing Expiration Date:   05/13/2019  . COMPLETE METABOLIC PANEL WITH GFR    Standing Status:   Future    Standing Expiration Date:   05/13/2019  . Hemoglobin A1c    Standing Status:   Future    Standing Expiration Date:   05/13/2019   Next appt:  09/11/2018 med mgt, fasting labs before  Kyler Germer L. Kshawn Canal, D.O. [facility] [address] [place], [place] [address] Cell Phone (Mon-Fri 8am-5pm):  [phone] On Call:  [phone] & follow prompts after 5pm & weekends Office Phone:  [phone] Office Fax:  [phone]

## 2018-05-12 NOTE — Patient Instructions (Addendum)
Take your lasix regularly. Take celebrex 100mg  each morning with breakfast for your arthritis pain.  Continue to use [place] dream on your hands when they ache.  IMPORTANT INFORMATION ABOUT YOUR OPIOID PRESCRIPTION --this includes oxycodone, hydrocodone, morphine, etc. What are Opioids? How Can I Safely Take Opioids? Where Can I Get Help?  . Opioids are narcotic pain medications used for moderate-to-severe pain, often after surgery or injury. . Evidence shows there may be better and safer options for long term pain from back pain, fibromyalgia, and nerve pain other than opioids. . Do not take more tablets or capsules than prescribed by your doctor or more often than prescribed. . Do not crush long-acting tablets. . Do not share or sell your prescription. . Store medication in a safe, secure, dry location away from pets, children, family, and visitors. . Naloxone, a medication that can reverse opioid-induced oversedation, can be purchased at some pharmacies and kept on hand for emergency use. IF YOU NEED HELP for substance abuse problems, talk with your primary care provider and contact a local resource: . [facility] (334)310-8198 . [place] (260)707-5799 . CURE (Community United in Response to an Epidemic)     SaveSearches.co.nz . [facility] 501-863-6793 . [facility] Solution to the Opioid Problem)  906-856-8549 . [place] 419-543-1824 . Narcotics Anonymous www.[place] 412-631-4569 . [place] [facility]  (586) 855-1847 . [place] 1-800-662-HELP or visit https://findtreatment.[place].com.cy to find a treatment center near you . [facility] (743)818-5956 . [facility] 6293870098 Updated 02/11/2018  What are the Major  Risks of Opioid Use?    . Opioid use can have serious risk of addiction and overdose. . Overdose is  often seen as slow breathing and can cause sudden death. . Other risks include tolerance (needing more for the same relief) and dependence (withdrawal when stopped).    .  How Do I Get Rid of Unused Opioids?   Marland Kitchen  Marland Kitchen Safely dispose of all opioids as soon as you no longer need them. . To dispose at home, mix the drug with an undesirable substance like coffee grounds or cat litter then throw them away. Marland Kitchen Opioids can be dropped off at local sheriff's offices or police departments. . If unable to safely dispose of them as above, opioids may be flushed.   What are Some Ways to Help Decrease Major Risk for Overdose?    Marland Kitchen Avoid alcohol, other opioids, sedating antihistamines (Benadryl, etc.) drugs for sleep/anxiety (Xanax, Ativan, Valium, Ambien, etc.), and muscle relaxants (Flexeril, Soma, Skelaxin, etc.).

## 2018-05-15 ENCOUNTER — Ambulatory Visit: Payer: PPO | Admitting: Cardiology

## 2018-05-15 ENCOUNTER — Encounter: Payer: Self-pay | Admitting: Cardiology

## 2018-05-15 VITALS — BP 122/64 | HR 82 | Ht 70.0 in | Wt 221.0 lb

## 2018-05-15 DIAGNOSIS — I4819 Other persistent atrial fibrillation: Secondary | ICD-10-CM | POA: Diagnosis not present

## 2018-05-15 DIAGNOSIS — I10 Essential (primary) hypertension: Secondary | ICD-10-CM

## 2018-05-15 NOTE — Progress Notes (Signed)
Electrophysiology Office Note   Date:  05/15/2018   ID:  Ronnie Jackson, DOB August 12, 1935, MRN [id number]  PCP:  Gayland Curry, DO  Primary Electrophysiologist:  Constance Haw, MD    No chief complaint on file.    History of Present Illness: Ronnie Jackson is a 82 y.o. male who presents today for electrophysiology evaluation.   Has a history of AF with cardioversion 07/12/16. He is back in atrial fibrillation. He says that he feels well without major complaint. He does not have any chest pain, shortness of breath, or fatigue.   Today, denies symptoms of palpitations, chest pain, shortness of breath, orthopnea, PND, lower extremity edema, claudication, dizziness, presyncope, syncope, bleeding, or neurologic sequela. The patient is tolerating medications without difficulties.  Overall he is doing well.  He recently had a right hip replacement.  He continues to be sore but is moving around much better over the last few days.  Otherwise he has no major complaints.   Past Medical History:  Diagnosis Date  . Anxiety state, unspecified   . Atrial fibrillation ([place]) 03/06/2016  . Chronic pain syndrome   . Constipation   . Depressive disorder, not elsewhere classified   . Dysphagia, unspecified(787.20)   . Herpes simplex disease   . Hyperlipidemia   . Hypertension   . Hypopotassemia   . Insomnia, unspecified   . Intestinovesical fistula   . Malignant neoplasm of prostate ([place])   . Myalgia and myositis, unspecified   . Osteoarthritis   . Pain in joint, lower leg    Bilateral knee pains  . Reflux esophagitis   . Rheumatoid arthritis with rheumatoid factor (HCC)   . Spinal stenosis, unspecified region other than cervical   . Thrombocytopenia, unspecified ([place])   . Type II or unspecified type diabetes mellitus without mention of complication, not stated as uncontrolled    Pt states that it is prediabetic  . Unspecified arthropathy, pelvic region and thigh   . Unspecified  hereditary and idiopathic peripheral neuropathy    Past Surgical History:  Procedure Laterality Date  . CARDIAC CATHETERIZATION  01/31/2004   Dr Gwenlyn Found  . CARDIOVERSION N/A 07/12/2016   Procedure: CARDIOVERSION;  Surgeon: Pixie Casino, MD;  Location: [place];  Service: Cardiovascular;  Laterality: N/A;  . COLONOSCOPY  08/09/2006   Dr Christian Mate, hemorrhoids/rectal fistula  . cystocopy  07/2006   Dr Harlow Asa  . NASAL SINUS SURGERY  1992  . PROSTATE SURGERY  1991  . TOTAL HIP ARTHROPLASTY Right 02/20/2018   Procedure: RIGHT TOTAL HIP ARTHROPLASTY ANTERIOR APPROACH;  Surgeon: Mcarthur Rossetti, MD;  [facility];  Service: Orthopedics;  Laterality: Right;     Current Outpatient Medications  Medication Sig Dispense Refill  . apixaban (ELIQUIS) 5 MG TABS tablet TAKE 1 TABLET BY MOUTH TWICE DAILY TO HELP PREVENT STROKE RELATED TO ATRIAL FIBRILLATION 60 tablet 11  . celecoxib (CELEBREX) 100 MG capsule Take 1 capsule (100 mg total) by mouth daily. With breakfast 30 capsule 3  . cromolyn (OPTICROM) 4 % ophthalmic solution Place 1 drop into both eyes 4 (four) times daily. 10 mL 5  . furosemide (LASIX) 40 MG tablet Take 40 mg by mouth 2 (two) times daily.    Marland Kitchen sulfamethoxazole-trimethoprim (BACTRIM DS,SEPTRA DS) 800-160 MG tablet Take 1 tablet by mouth as needed (for UTI).     No current facility-administered medications for this visit.     Allergies:   Patient has no known allergies.   Social  History:  The patient  reports that he has never smoked. His smokeless tobacco use includes chew. He reports that he does not drink alcohol or use drugs.   Family History:  The patient's family history includes Arthritis in his sister; Heart failure in his mother; Lung cancer in his daughter; Prostate cancer in his father.    ROS:  Please see the history of present illness.   Otherwise, review of systems is positive for swelling, back pain, balance problems.   All other systems are  reviewed and negative.   PHYSICAL EXAM: VS:  BP 122/64   Pulse 82   Ht 5\' 10"  (1.778 m)   Wt 221 lb (100.2 kg)   BMI 31.71 kg/m  , BMI Body mass index is 31.71 kg/m. GEN: Well nourished, well developed, in no acute distress  HEENT: normal  Neck: no JVD, carotid bruits, or masses Cardiac: iRRR; no murmurs, rubs, or gallops,no edema  Respiratory:  clear to auscultation bilaterally, normal work of breathing GI: soft, nontender, nondistended, + BS MS: no deformity or atrophy  Skin: warm and dry Neuro:  Strength and sensation are intact Psych: euthymic mood, full affect  EKG:  EKG is ordered today. Personal review of the ekg ordered shows atrial fibrillation, rate 82  Recent Lab//s: 05/08/2018: ALT 10; BUN 13; Creat 1.22; Hemoglobin 12.2; Platelets 163; Potassium 4.1; Sodium 141    Lipid Panel     Component Value Date/Time   CHOL 153 05/08/2018 0912   CHOL 161 07/01/2015 0944   TRIG 111 05/08/2018 0912   HDL 39 (L) 05/08/2018 0912   HDL 33 (L) 07/01/2015 0944   CHOLHDL 3.9 05/08/2018 0912   VLDL 26 02/06/2016 1227   LDLCALC 93 05/08/2018 0912     Wt Readings from Last 3 Encounters:  05/15/18 221 lb (100.2 kg)  05/12/18 221 lb (100.2 kg)  02/20/18 215 lb (97.5 kg)      Other studies Reviewed: Additional studies/ records that were reviewed today include: TTE 04/19/16 - Left ventricle: The cavity size was normal. Wall thickness was   increased in a pattern of mild LVH. Systolic function was   vigorous. The estimated ejection fraction was in the range of 65%   to 70%. Wall motion was normal; there were no regional wall   motion abnormalities. - Aortic valve: Trileaflet; mildly thickened, moderately calcified   leaflets. - Aorta: Ascending aortic diameter: 41 mm (S). - Ascending aorta: The ascending aorta was mildly dilated. - Left atrium: The atrium was normal in size. Volume/bsa, ES   (1-plane Simpson&'s, A4C): 29.8 ml/m^2.   ASSESSMENT AND PLAN:  1.   Persistent atrial fibrillation: Currently on Eliquis.  He remains in atrial fibrillation.  He is not have symptoms.  No changes. This patients CHA2DS2-VASc Score and unadjusted Ischemic Stroke Rate (% per year) is equal to 4.8 % stroke rate/year from a score of 4  Above score calculated as 1 point each if present [CHF, HTN, DM, Vascular=MI/PAD/Aortic Plaque, Age if 65-74, or Male] Above score calculated as 2 points each if present [Age > 75, or Stroke/TIA/TE]   2. Hypertension: Well-controlled today.  No changes.   3. LE edema: On Lasix without signs of volume overload  Current medicines are reviewed at length with the patient today.   The patient does not have concerns regarding his medicines.  The following changes were made today: None  Labs/ tests ordered today include:  Orders Placed This Encounter  Procedures  . EKG 12-Lead  Disposition:   FU with Braylyn Kalter 6 months  Signed, Romell Cavanah Meredith Leeds, MD  05/15/2018 3:07 PM     [place] [address] [place] [address] [phone] (office) [phone] (fax)

## 2018-05-15 NOTE — Patient Instructions (Signed)
Medication Instructions:  Your physician recommends that you continue on your current medications as directed. Please refer to the Current Medication list given to you today.  If you need a refill on your cardiac medications before your next appointment, please call your pharmacy.   Lab work: None ordered  If you have labs (blood work) drawn today and your tests are completely normal, you will receive your results only by: Marland Kitchen MyChart Message (if you have MyChart) OR . A paper copy in the mail If you have any lab test that is abnormal or we need to change your treatment, we will call you to review the results.  Testing/Procedures: None ordered   Follow-Up: At [facility], you and your health needs are our priority.  As part of our continuing mission to provide you with exceptional heart care, we have created designated Provider Care Teams.  These Care Teams include your primary Cardiologist (physician) and Advanced Practice Providers (APPs -  Physician Assistants and Nurse Practitioners) who all work together to provide you with the care you need, when you need it. You will need a follow up appointment in 6 months.  Please call our office 2 months in advance to schedule this appointment.  You may see Dr. Curt Bears or one of the following Advanced Practice Providers on your designated Care Team:   Chanetta Marshall, NP . Tommye Standard, PA-C  Thank you for choosing [facility]!!   Trinidad Curet, RN [phone]

## 2018-06-27 MED FILL — FUROSEMIDE 40 MG TAB: 40 | 30 days supply | Qty: 60 | Fill #2

## 2018-07-25 MED FILL — SULFAMETHOXAZOLE-TMP DS TAB: 800-160 | 30 days supply | Qty: 60 | Fill #2

## 2018-09-01 DIAGNOSIS — E119 Type 2 diabetes mellitus without complications: Secondary | ICD-10-CM | POA: Diagnosis not present

## 2018-09-01 DIAGNOSIS — H04123 Dry eye syndrome of bilateral lacrimal glands: Secondary | ICD-10-CM | POA: Diagnosis not present

## 2018-09-01 DIAGNOSIS — H1045 Other chronic allergic conjunctivitis: Secondary | ICD-10-CM | POA: Diagnosis not present

## 2018-09-01 DIAGNOSIS — H353131 Nonexudative age-related macular degeneration, bilateral, early dry stage: Secondary | ICD-10-CM | POA: Diagnosis not present

## 2018-09-01 DIAGNOSIS — Z961 Presence of intraocular lens: Secondary | ICD-10-CM | POA: Diagnosis not present

## 2018-09-01 LAB — HM DIABETES EYE EXAM

## 2018-09-03 ENCOUNTER — Encounter: Payer: Self-pay | Admitting: *Deleted

## 2018-09-09 ENCOUNTER — Encounter: Payer: Self-pay | Admitting: Family

## 2018-09-09 ENCOUNTER — Other Ambulatory Visit: Payer: PPO

## 2018-09-09 DIAGNOSIS — E1142 Type 2 diabetes mellitus with diabetic polyneuropathy: Secondary | ICD-10-CM

## 2018-09-10 LAB — COMPLETE METABOLIC PANEL WITH GFR
AG Ratio: 1.6 (calc) (ref 1.0–2.5)
ALT: 9 U/L (ref 9–46)
AST: 18 U/L (ref 10–35)
Albumin: 4.1 g/dL (ref 3.6–5.1)
Alkaline phosphatase (APISO): 78 U/L (ref 35–144)
BUN/Creatinine Ratio: 13 (calc) (ref 6–22)
BUN: 15 mg/dL (ref 7–25)
CO2: 31 mmol/L (ref 20–32)
Calcium: 9.1 mg/dL (ref 8.6–10.3)
Chloride: 103 mmol/L (ref 98–110)
Creat: 1.17 mg/dL — ABNORMAL HIGH (ref 0.70–1.11)
GFR, Est African American: 67 mL/min/{1.73_m2} (ref >=60)
GFR, Est Non African American: 58 mL/min/{1.73_m2} — ABNORMAL LOW (ref >=60)
Globulin: 2.5 g/dL (calc) (ref 1.9–3.7)
Glucose, Bld: 96 mg/dL (ref 65–99)
Potassium: 4.3 mmol/L (ref 3.5–5.3)
Sodium: 139 mmol/L (ref 135–146)
Total Bilirubin: 1.2 mg/dL (ref 0.2–1.2)
Total Protein: 6.6 g/dL (ref 6.1–8.1)

## 2018-09-10 LAB — CBC WITH DIFFERENTIAL/PLATELET
Absolute Monocytes: 506 cells/uL (ref 200–950)
Basophils Absolute: 32 cells/uL (ref 0–200)
Basophils Relative: 0.5 %
Eosinophils Absolute: 224 cells/uL (ref 15–500)
Eosinophils Relative: 3.5 %
HCT: 37.8 % — ABNORMAL LOW (ref 38.5–50.0)
Hemoglobin: 12.7 g/dL — ABNORMAL LOW (ref 13.2–17.1)
Lymphs Abs: 2464 cells/uL (ref 850–3900)
MCH: 31.1 pg (ref 27.0–33.0)
MCHC: 33.6 g/dL (ref 32.0–36.0)
MCV: 92.4 fL (ref 80.0–100.0)
MPV: 12.2 fL (ref 7.5–12.5)
Monocytes Relative: 7.9 %
Neutro Abs: 3174 cells/uL (ref 1500–7800)
Neutrophils Relative %: 49.6 %
Platelets: 146 10*3/uL (ref 140–400)
RBC: 4.09 10*6/uL — ABNORMAL LOW (ref 4.20–5.80)
RDW: 13.7 % (ref 11.0–15.0)
Total Lymphocyte: 38.5 %
WBC: 6.4 10*3/uL (ref 3.8–10.8)

## 2018-09-10 LAB — HEMOGLOBIN A1C
Hgb A1c MFr Bld: 6.4 % of total Hgb — ABNORMAL HIGH (ref <5.7)
Mean Plasma Glucose: 137 (calc)
eAG (mmol/L): 7.6 (calc)

## 2018-09-11 ENCOUNTER — Encounter: Payer: Self-pay | Admitting: Internal Medicine

## 2018-09-11 ENCOUNTER — Encounter: Payer: Self-pay | Admitting: *Deleted

## 2018-09-11 ENCOUNTER — Ambulatory Visit (INDEPENDENT_AMBULATORY_CARE_PROVIDER_SITE_OTHER): Payer: PPO | Admitting: Internal Medicine

## 2018-09-11 VITALS — BP 128/68 | HR 99 | Temp 97.7°F | Ht 70.0 in | Wt 224.0 lb

## 2018-09-11 DIAGNOSIS — M1990 Unspecified osteoarthritis, unspecified site: Secondary | ICD-10-CM | POA: Diagnosis not present

## 2018-09-11 DIAGNOSIS — I4819 Other persistent atrial fibrillation: Secondary | ICD-10-CM

## 2018-09-11 DIAGNOSIS — Z720 Tobacco use: Secondary | ICD-10-CM

## 2018-09-11 DIAGNOSIS — E1142 Type 2 diabetes mellitus with diabetic polyneuropathy: Secondary | ICD-10-CM | POA: Diagnosis not present

## 2018-09-11 DIAGNOSIS — G894 Chronic pain syndrome: Secondary | ICD-10-CM | POA: Diagnosis not present

## 2018-09-11 DIAGNOSIS — H9113 Presbycusis, bilateral: Secondary | ICD-10-CM

## 2018-09-11 DIAGNOSIS — E7800 Pure hypercholesterolemia, unspecified: Secondary | ICD-10-CM | POA: Diagnosis not present

## 2018-09-11 DIAGNOSIS — M159 Polyosteoarthritis, unspecified: Secondary | ICD-10-CM

## 2018-09-11 DIAGNOSIS — M1380 Other specified arthritis, unspecified site: Secondary | ICD-10-CM

## 2018-09-11 MED ORDER — MORPHINE SULFATE 15 MG PO TABS: 15.0000 mg | ORAL_TABLET | Freq: Four times a day (QID) | ORAL | 0 refills | Status: DC | PRN

## 2018-09-11 NOTE — Progress Notes (Signed)
Location:  [facility] clinic Provider:  Jafar Poffenberger L. Mariea Clonts, D.O., C.M.D.  Goals of Care:  Advanced Directives 02/20/2018  Does Patient Have a Medical Advance Directive? No  Would patient like information on creating a medical advance directive? No - Patient declined   Chief Complaint  Patient presents with  . Medical Management of Chronic Issues    [facility] follow-up    HPI: Patient is a 83y.o. male seen today for medical management of chronic diseases.    He is bad.  He says he has got to have something for pain or he's not going to live much longer.  He cannot sleep at night b/c he hurts all over.  Says his OA generally has worsened lately.  His feet hurt.    Sugar did go up, but not too bad.  Says he eats when he can't sleep.     He's still in afib.  On eliquis.  Asymptomatic.  His hearing loss remains severe.  His family is going to all contribute to get him some good hearing aids.  Past Medical History:  Diagnosis Date  . Anxiety state, unspecified   . Atrial fibrillation ([place] 03/06/2016  . Chronic pain syndrome   . Constipation   . Depressive disorder, not elsewhere classified   . Dysphagia, unspecified(787.20)   . Herpes simplex disease   . Hyperlipidemia   . Hypertension   . Hypopotassemia   . Insomnia, unspecified   . Intestinovesical fistula   . Malignant neoplasm of prostate ([place]   . Myalgia and myositis, unspecified   . Osteoarthritis   . Pain in joint, lower leg    Bilateral knee pains  . Reflux esophagitis   . Rheumatoid arthritis with rheumatoid factor (HCC)   . Spinal stenosis, unspecified region other than cervical   . Thrombocytopenia, unspecified ([place]   . Type II or unspecified type diabetes mellitus without mention of complication, not stated as uncontrolled    Pt states that it is prediabetic  . Unspecified arthropathy, pelvic region and thigh   . Unspecified hereditary and idiopathic peripheral neuropathy     Past Surgical History:  Procedure  Laterality Date  . CARDIAC CATHETERIZATION  01/31/2004   Dr BGwenlyn Found . CARDIOVERSION N/A 07/12/2016   Procedure: CARDIOVERSION;  Surgeon: KPixie Casino MD;  [facility]  Service: Cardiovascular;  Laterality: N/A;  . COLONOSCOPY  08/09/2006   Dr RChristian Mate hemorrhoids/rectal fistula  . cystocopy  07/2006   Dr PHarlow Asa . NASAL SINUS SURGERY  1992  . PROSTATE SURGERY  1991  . TOTAL HIP ARTHROPLASTY Right 02/20/2018   Procedure: RIGHT TOTAL HIP ARTHROPLASTY ANTERIOR APPROACH;  Surgeon: BMcarthur Rossetti MD;  Location: [facility];  Service: Orthopedics;  Laterality: Right;    No Known Allergies  Outpatient Encounter Medications as of 09/11/2018  Medication Sig  . apixaban (ELIQUIS) 5 MG TABS tablet TAKE 1 TABLET BY MOUTH TWICE DAILY TO HELP PREVENT STROKE RELATED TO ATRIAL FIBRILLATION  . celecoxib (CELEBREX) 100 MG capsule Take 1 capsule (100 mg total) by mouth daily. With breakfast  . cromolyn (OPTICROM) 4 % ophthalmic solution Place 1 drop into both eyes 4 (four) times daily.  . furosemide (LASIX) 40 MG tablet Take 40 mg by mouth 2 (two) times daily.  .Marland Kitchensulfamethoxazole-trimethoprim (BACTRIM DS,SEPTRA DS) 800-160 MG tablet Take 1 tablet by mouth as needed (for UTI).   No facility-administered encounter medications on file as of 09/11/2018.     Review of Systems:  Review of  Systems  Constitutional: Positive for malaise/fatigue. Negative for chills and fever.  HENT: Positive for hearing loss.   Eyes: Negative for blurred vision.  Respiratory: Negative for shortness of breath.   Cardiovascular: Negative for chest pain, palpitations and leg swelling.  Genitourinary: Negative for dysuria.  Musculoskeletal: Positive for back pain, joint pain and neck pain. Negative for falls.       Pain in most joints, some swelling in hands; reportedly has RA per chart, but no labs to indicate as such  Skin: Negative for itching and rash.  Neurological: Negative for loss of consciousness  and headaches.  Psychiatric/Behavioral: Positive for depression. Negative for memory loss. The patient has insomnia. The patient is not nervous/anxious.     Health Maintenance  Topic Date Due  . FOOT EXAM  12/22/2015  . TETANUS/TDAP  11/01/2024 (Originally 08/02/1954)  . HEMOGLOBIN A1C  03/10/2019  . URINE MICROALBUMIN  05/09/2019  . OPHTHALMOLOGY EXAM  09/02/2019  . INFLUENZA VACCINE  Completed  . PNA vac Low Risk Adult  Completed    Physical Exam: Vitals:   09/11/18 1446  Weight: 224 lb (101.6 kg)   Body mass index is 32.14 kg/m. Physical Exam  Labs reviewed: Basic Metabolic Panel: Recent Labs    02/21/18 0536 05/08/18 0912 09/09/18 0956  NA 139 141 139  K 4.0 4.1 4.3  CL 102 100 103  CO2 28 32 31  GLUCOSE 206* 96 96  BUN _0 CREATININE 0.99 1.22* 1.17*  CALCIUM 8.6* 8.6 9.1   Liver Function Tests: Recent Labs    01/08/18 1110 05/08/18 0912 09/09/18 0956  AST _1 ALT _2 BILITOT 1.0 0.7 1.2  PROT 6.7 6.7 6.6   No results for input(s): LIPASE, AMYLASE in the last 8760 hours. No results for input(s): AMMONIA in the last 8760 hours. CBC: Recent Labs    01/08/18 1110  02/23/18 0426 05/08/18 0912 09/09/18 0956  WBC 7.3   < > 10.0 7.3 6.4  NEUTROABS 4,672  --   --  3,920 3,174  HGB 12.3*   < > 10.8* 12.2* 12.7*  HCT 36.7*   < > 32.8* 36.4* 37.8*  MCV 89.7   < > 93.2 91.2 92.4  PLT 182   < > 152 163 146   < > = values in this interval not displayed.   Lipid Panel: Recent Labs    09/12/17 1015 01/08/18 1110 05/08/18 0912  CHOL 144 135 153  HDL 35* 36* 39*  LDLCALC 89 83 93  TRIG 102 75 111  CHOLHDL 4.1 3.8 3.9   Lab Results  Component Value Date   HGBA1C 6.4 (H) 09/09/2018    Assessment/Plan 1. DM type 2 with diabetic peripheral neuropathy (HCC) - eating poorly which he blames on being in pain so making poor choices, also not active much b/c of pain  - he wants to try to make better choices rather than going on meds at  this time and reassess labs first - Hemoglobin A1c; Future  2. Generalized osteoarthritis of multiple sites - will resume low dose morphine for him AS NEEDED--he reports he will not use it except when he needs it -he will be tested -he also reports that he and his wife will not give any of the medication to their son--"I know he has a problem and I would never do that." -he signed to controlled substance contract which was reviewed in detail with him by the [facility] in  his wife's presence--it is in the chart - morphine (MSIR) 15 MG tablet; Take 1 tablet (15 mg total) by mouth every 6 (six) hours as needed for severe pain.  Dispense: 120 tablet; Refill: 0 - Drug Tox Monitor 1 w/Conf, Oral Fld; Future  3. Persistent atrial fibrillation - cont eliquis therapy, f/u labs -rate controlled w/o any med for rate - CBC with Differential/Platelet; Future - COMPLETE METABOLIC PANEL WITH GFR; Future  4. Pure hypercholesterolemia -not on med for cholesterol, but eating habits poor -reports he will do better when not in pain, will reassess before next visit to see--if not better, begin crestor weekly to get to goal - Lipid panel; Future  5. Chronic pain syndrome -oddly, he still has chronic pain in his right hip after surgery, but he suffers pain in his lower back, hands, elbows, shoulders, too -I may check RF, as well on him and ESR with next labs b/c chart says he has RA, but I don't see any of this info - morphine (MSIR) 15 MG tablet; Take 1 tablet (15 mg total) by mouth every 6 (six) hours as needed for severe pain.  Dispense: 120 tablet; Refill: 0 - Drug Tox Monitor 1 w/Conf, Oral Fld; Future  6. Tobacco abuse -continue to chew and is aware of risks, no evidence of lesions of gums/oral mucosa noted  7. Presbycusis of both ears -ongoing, reportedly, his whole family plans to contribute to getting him new hearing aids due to their frustration communicating with him and his refusal to put money into it  himself despite recommendations  Labs/tests ordered:   Orders Placed This Encounter  Procedures  . CBC with Differential/Platelet    Standing Status:   Future    Standing Expiration Date:   09/12/2019  . COMPLETE METABOLIC PANEL WITH GFR    Standing Status:   Future    Standing Expiration Date:   09/12/2019  . Hemoglobin A1c    Standing Status:   Future    Standing Expiration Date:   09/12/2019  . Lipid panel    Standing Status:   Future    Standing Expiration Date:   09/12/2019  . Drug Tox Monitor 1 w/Conf, Oral Fld    Morphine    Standing Status:   Future    Standing Expiration Date:   09/12/2019    Next appt:  11/25/2018   Ronnie Jackson L. Codylee Patil, D.O. [facility] [address], [place] [address] Cell Phone (Mon-Fri 8am-5pm):  [phone] On Call:  [phone] & follow prompts after 5pm & weekends Office Phone:  [phone] Office Fax:  [phone]

## 2018-09-11 NOTE — Patient Instructions (Signed)
We will start you on morphine IR 15mg  every 6 hours as needed for severe pain.   This should help control your pain. You may also use tylenol up to 3000mg  per day, you may also use topicals for your severe joint pain--examples include aspercreme, biofreeze or icy hot.    Please cut down on sweets and starchy foods that make your sugar go up.  We will recheck next time.

## 2018-09-15 ENCOUNTER — Telehealth: Payer: Self-pay | Admitting: *Deleted

## 2018-09-15 NOTE — Telephone Encounter (Signed)
Morphine sulfate 15mg   NOT the ER.

## 2018-09-15 NOTE — Telephone Encounter (Signed)
Morphine (MSIR) 15mg  requires Prior Authorization.  The only Morphine through CoverMyMeds is:  Morphine Sulfate 15mg  or Morphine Sulfate ER 15mg   Which should I use to start the process. Please Advise.

## 2018-09-16 NOTE — Telephone Encounter (Signed)
Prior Authorization Initiated through [facility]. Went into determination 24-72 hour turn around.  Envision #[id number]  CRF:[id number] PA Case ID: [id number]

## 2018-09-17 MED FILL — MORPHINE SULFATE IR 15 MG T: 15 | 30 days supply | Qty: 120 | Fill #0

## 2018-09-17 NOTE — Telephone Encounter (Signed)
Received fax from [facility] Rx and Morphine Sulfate APPROVED through 09/30/2018

## 2018-10-02 MED FILL — FUROSEMIDE 40 MG TAB: 40 | 30 days supply | Qty: 60 | Fill #3

## 2018-10-02 MED FILL — SULFAMETHOXAZOLE-TMP DS TAB: 800-160 | 30 days supply | Qty: 60 | Fill #3

## 2018-10-27 ENCOUNTER — Other Ambulatory Visit: Payer: Self-pay

## 2018-10-27 MED ORDER — APIXABAN 5 MG PO TABS: ORAL_TABLET | ORAL | 5 refills | Status: DC

## 2018-10-30 ENCOUNTER — Ambulatory Visit (INDEPENDENT_AMBULATORY_CARE_PROVIDER_SITE_OTHER): Payer: PPO | Admitting: Orthopaedic Surgery

## 2018-11-10 ENCOUNTER — Other Ambulatory Visit: Payer: Self-pay | Admitting: *Deleted

## 2018-11-10 MED ORDER — APIXABAN 5 MG PO TABS: ORAL_TABLET | ORAL | 5 refills | Status: DC

## 2018-11-10 MED FILL — ELIQUIS 5 MG TABLET: 5 | 30 days supply | Qty: 60 | Fill #0

## 2018-11-10 NOTE — Telephone Encounter (Signed)
No samples available. Wife requested rx to be sent to pharmacy. Faxed.

## 2018-11-13 ENCOUNTER — Other Ambulatory Visit: Payer: Self-pay | Admitting: *Deleted

## 2018-11-13 DIAGNOSIS — M159 Polyosteoarthritis, unspecified: Secondary | ICD-10-CM

## 2018-11-13 DIAGNOSIS — G894 Chronic pain syndrome: Secondary | ICD-10-CM

## 2018-11-13 MED ORDER — MORPHINE SULFATE 15 MG PO TABS: 15.0000 mg | ORAL_TABLET | Freq: Four times a day (QID) | ORAL | 0 refills | Status: DC | PRN

## 2018-11-13 MED FILL — MORPHINE SULFATE IR 15 MG T: 15 | 30 days supply | Qty: 120 | Fill #0

## 2018-11-13 NOTE — Telephone Encounter (Signed)
Patient wife, [name] called and requested rx [place] Verified LR: 09/17/2018 Pended Rx and sent to Dr. Mariea Clonts for approval.

## 2018-11-24 ENCOUNTER — Encounter: Payer: Self-pay | Admitting: Family

## 2018-11-24 ENCOUNTER — Ambulatory Visit: Payer: Self-pay

## 2018-11-25 ENCOUNTER — Ambulatory Visit (INDEPENDENT_AMBULATORY_CARE_PROVIDER_SITE_OTHER): Payer: PPO | Admitting: Family

## 2018-11-25 ENCOUNTER — Other Ambulatory Visit: Payer: Self-pay

## 2018-11-25 DIAGNOSIS — Z0000 Encounter for general adult medical examination without abnormal findings: Secondary | ICD-10-CM

## 2018-11-25 MED ORDER — CROMOLYN SODIUM 4 % OP SOLN: 1.0000 [drp] | Freq: Four times a day (QID) | OPHTHALMIC | 5 refills | Status: DC

## 2018-11-25 NOTE — Patient Instructions (Signed)
[name] , Thank you for taking time to come for your Medicare Wellness Visit. I appreciate your ongoing commitment to your health goals. Please review the following plan we discussed and let me know if I can assist you in the future.   Screening recommendations/referrals: Colonoscopy: N/A  Recommended yearly ophthalmology/optometry visit for glaucoma screening and checkup Recommended yearly dental visit for hygiene and checkup  Vaccinations: Influenza vaccine:  up to date  Pneumococcal vaccine: Up to date  Tdap vaccine: Up to date due 11/01/2024  Shingles vaccine: Declined    Advanced directives: None   Conditions/risks identified: Advanced Age men >55 years,male gender,sedentary lifestyle,hx chewing tobacco   Next appointment: 1 year   Preventive Care 56 Years and Older, Male Preventive care refers to lifestyle choices and visits with your health care provider that can promote health and wellness. What does preventive care include?  A yearly physical exam. This is also called an annual well check.  Dental exams once or twice a year.  Routine eye exams. Ask your health care provider how often you should have your eyes checked.  Personal lifestyle choices, including:  Daily care of your teeth and gums.  Regular physical activity.  Eating a healthy diet.  Avoiding tobacco and drug use.  Limiting alcohol use.  Practicing safe sex.  Taking low doses of aspirin every day.  Taking vitamin and mineral supplements as recommended by your health care provider. What happens during an annual well check? The services and screenings done by your health care provider during your annual well check will depend on your age, overall health, lifestyle risk factors, and family history of disease. Counseling  Your health care provider may ask you questions about your:  Alcohol use.  Tobacco use.  Drug use.  Emotional well-being.  Home and relationship well-being.  Sexual  activity.  Eating habits.  History of falls.  Memory and ability to understand (cognition).  Work and work Statistician. Screening  You may have the following tests or measurements:  Height, weight, and BMI.  Blood pressure.  Lipid and cholesterol levels. These may be checked every 5 years, or more frequently if you are over 72 years old.  Skin check.  Lung cancer screening. You may have this screening every year starting at age 53 if you have a 30-pack-year history of smoking and currently smoke or have quit within the past 15 years.  Fecal occult blood test (FOBT) of the stool. You may have this test every year starting at age 76.  Flexible sigmoidoscopy or colonoscopy. You may have a sigmoidoscopy every 5 years or a colonoscopy every 10 years starting at age 76.  Prostate cancer screening. Recommendations will vary depending on your family history and other risks.  Hepatitis C blood test.  Hepatitis B blood test.  Sexually transmitted disease (STD) testing.  Diabetes screening. This is done by checking your blood sugar (glucose) after you have not eaten for a while (fasting). You may have this done every 1-3 years.  Abdominal aortic aneurysm (AAA) screening. You may need this if you are a current or former smoker.  Osteoporosis. You may be screened starting at age 55 if you are at high risk. Talk with your health care provider about your test results, treatment options, and if necessary, the need for more tests. Vaccines  Your health care provider may recommend certain vaccines, such as:  Influenza vaccine. This is recommended every year.  Tetanus, diphtheria, and acellular pertussis (Tdap, Td) vaccine. You may  need a Td booster every 10 years.  Zoster vaccine. You may need this after age 1.  Pneumococcal 13-valent conjugate (PCV13) vaccine. One dose is recommended after age 46.  Pneumococcal polysaccharide (PPSV23) vaccine. One dose is recommended after age 86.  Talk to your health care provider about which screenings and vaccines you need and how often you need them. This information is not intended to replace advice given to you by your health care provider. Make sure you discuss any questions you have with your health care provider. Document Released: 08/05/2015 Document Revised: 03/28/2016 Document Reviewed: 05/10/2015 Elsevier Interactive Patient Education  2017 [place] Prevention in the Home Falls can cause injuries. They can happen to people of all ages. There are many things you can do to make your home safe and to help prevent falls. What can I do on the outside of my home?  Regularly fix the edges of walkways and driveways and fix any cracks.  Remove anything that might make you trip as you walk through a door, such as a raised step or threshold.  Trim any bushes or trees on the path to your home.  Use bright outdoor lighting.  Clear any walking paths of anything that might make someone trip, such as rocks or tools.  Regularly check to see if handrails are loose or broken. Make sure that both sides of any steps have handrails.  Any raised decks and porches should have guardrails on the edges.  Have any leaves, snow, or ice cleared regularly.  Use sand or salt on walking paths during winter.  Clean up any spills in your garage right away. This includes oil or grease spills. What can I do in the bathroom?  Use night lights.  Install grab bars by the toilet and in the tub and shower. Do not use towel bars as grab bars.  Use non-skid mats or decals in the tub or shower.  If you need to sit down in the shower, use a plastic, non-slip stool.  Keep the floor dry. Clean up any water that spills on the floor as soon as it happens.  Remove soap buildup in the tub or shower regularly.  Attach bath mats securely with double-sided non-slip rug tape.  Do not have throw rugs and other things on the floor that can make you  trip. What can I do in the bedroom?  Use night lights.  Make sure that you have a light by your bed that is easy to reach.  Do not use any sheets or blankets that are too big for your bed. They should not hang down onto the floor.  Have a firm chair that has side arms. You can use this for support while you get dressed.  Do not have throw rugs and other things on the floor that can make you trip. What can I do in the kitchen?  Clean up any spills right away.  Avoid walking on wet floors.  Keep items that you use a lot in easy-to-reach places.  If you need to reach something above you, use a strong step stool that has a grab bar.  Keep electrical cords out of the way.  Do not use floor polish or wax that makes floors slippery. If you must use wax, use non-skid floor wax.  Do not have throw rugs and other things on the floor that can make you trip. What can I do with my stairs?  Do not leave any items on  the stairs.  Make sure that there are handrails on both sides of the stairs and use them. Fix handrails that are broken or loose. Make sure that handrails are as long as the stairways.  Check any carpeting to make sure that it is firmly attached to the stairs. Fix any carpet that is loose or worn.  Avoid having throw rugs at the top or bottom of the stairs. If you do have throw rugs, attach them to the floor with carpet tape.  Make sure that you have a light switch at the top of the stairs and the bottom of the stairs. If you do not have them, ask someone to add them for you. What else can I do to help prevent falls?  Wear shoes that:  Do not have high heels.  Have rubber bottoms.  Are comfortable and fit you well.  Are closed at the toe. Do not wear sandals.  If you use a stepladder:  Make sure that it is fully opened. Do not climb a closed stepladder.  Make sure that both sides of the stepladder are locked into place.  Ask someone to hold it for you, if  possible.  Clearly mark and make sure that you can see:  Any grab bars or handrails.  First and last steps.  Where the edge of each step is.  Use tools that help you move around (mobility aids) if they are needed. These include:  Canes.  Walkers.  Scooters.  Crutches.  Turn on the lights when you go into a dark area. Replace any light bulbs as soon as they burn out.  Set up your furniture so you have a clear path. Avoid moving your furniture around.  If any of your floors are uneven, fix them.  If there are any pets around you, be aware of where they are.  Review your medicines with your doctor. Some medicines can make you feel dizzy. This can increase your chance of falling. Ask your doctor what other things that you can do to help prevent falls. This information is not intended to replace advice given to you by your health care provider. Make sure you discuss any questions you have with your health care provider. Document Released: 05/05/2009 Document Revised: 12/15/2015 Document Reviewed: 08/13/2014 Elsevier Interactive Patient Education  2017 [employer].

## 2018-11-25 NOTE — Progress Notes (Signed)
Subjective:   Ronnie Jackson is a 83 y.o. male who presents for Medicare Annual/Subsequent preventive examination.  Review of Systems:   Cardiac Risk Factors include: advanced age (>8men, >60 women);male gender;sedentary lifestyle;smoking/ tobacco exposure     Objective:    Vitals: There were no vitals taken for this visit.  There is no height or weight on file to calculate BMI.  Advanced Directives 11/25/2018 11/25/2018 02/20/2018 02/19/2018 11/20/2017 09/12/2017 11/21/2016  Does Patient Have a Medical Advance Directive? No No No No No No No  Would patient like information on creating a medical advance directive? - - No - Patient declined No - Patient declined Yes (MAU/Ambulatory/Procedural Areas - Information given) No - Patient declined -    Tobacco Social History   Tobacco Use  Smoking Status Never Smoker  Smokeless Tobacco Current User  . Types: Chew  Tobacco Comment   Chews tobacco daily      Ready to quit: Not Answered Counseling given: Not Answered Comment: Chews tobacco daily    Clinical Intake:  Pre-visit preparation completed: No  Pain : 0-10 Pain Score: 8  Pain Type: Chronic pain Pain Location: Generalized Pain Orientation: Other (Comment)(generalized arthritis ) Pain Radiating Towards: no Pain Descriptors / Indicators: Aching Pain Onset: Other (comment)(chronic ) Pain Frequency: Constant Pain Relieving Factors: Pain medication  Effect of Pain on Daily Activities: yes ambulation   Pain Relieving Factors: Pain medication   Nutritional Risks: None Diabetes: No  How often do you need to have someone help you when you read instructions, pamphlets, or other written materials from your doctor or pharmacy?: 1 - Never What is the last grade level you completed in school?: 12 grade   Interpreter Needed?: No  Information entered by ::   FNP-C   Past Medical History:  Diagnosis Date  . Anxiety state, unspecified   . Atrial fibrillation ([place])  03/06/2016  . Chronic pain syndrome   . Constipation   . Depressive disorder, not elsewhere classified   . Dysphagia, unspecified(787.20)   . Herpes simplex disease   . Hyperlipidemia   . Hypertension   . Hypopotassemia   . Insomnia, unspecified   . Intestinovesical fistula   . Malignant neoplasm of prostate ([place])   . Myalgia and myositis, unspecified   . Osteoarthritis   . Pain in joint, lower leg    Bilateral knee pains  . Reflux esophagitis   . Rheumatoid arthritis with rheumatoid factor (HCC)   . Spinal stenosis, unspecified region other than cervical   . Thrombocytopenia, unspecified ([place])   . Type II or unspecified type diabetes mellitus without mention of complication, not stated as uncontrolled    Pt states that it is prediabetic  . Unspecified arthropathy, pelvic region and thigh   . Unspecified hereditary and idiopathic peripheral neuropathy    Past Surgical History:  Procedure Laterality Date  . CARDIAC CATHETERIZATION  01/31/2004   Dr Gwenlyn Found  . CARDIOVERSION N/A 07/12/2016   Procedure: CARDIOVERSION;  Surgeon: Pixie Casino, MD;  Location: [facility];  Service: Cardiovascular;  Laterality: N/A;  . COLONOSCOPY  08/09/2006   Dr Christian Mate, hemorrhoids/rectal fistula  . cystocopy  07/2006   Dr Harlow Asa  . NASAL SINUS SURGERY  1992  . PROSTATE SURGERY  1991  . TOTAL HIP ARTHROPLASTY Right 02/20/2018   Procedure: RIGHT TOTAL HIP ARTHROPLASTY ANTERIOR APPROACH;  Surgeon: Mcarthur Rossetti, MD;  Location: [facility];  Service: Orthopedics;  Laterality: Right;   Family History  Problem Relation Age of Onset  .  Prostate cancer Father   . Heart failure Mother   . Lung cancer Daughter   . Arthritis Sister    Social History   Socioeconomic History  . Marital status: Married    Spouse name: Not on file  . Number of children: Not on file  . Years of education: Not on file  . Highest education level: Not on file  Occupational History  . Not on file  Social Needs   . Financial resource strain: Not hard at all  . Food insecurity:    Worry: Never true    Inability: Never true  . Transportation needs:    Medical: No    Non-medical: No  Tobacco Use  . Smoking status: Never Smoker  . Smokeless tobacco: Current User    Types: Chew  . Tobacco comment: Chews tobacco daily   Substance and Sexual Activity  . Alcohol use: No  . Drug use: No  . Sexual activity: Not Currently  Lifestyle  . Physical activity:    Days per week: 0 days    Minutes per session: 0 min  . Stress: To some extent  Relationships  . Social connections:    Talks on phone: More than three times a week    Gets together: More than three times a week    Attends religious service: More than 4 times per year    Active member of club or organization: No    Attends meetings of clubs or organizations: Never    Relationship status: Married  Other Topics Concern  . Not on file  Social History Narrative  . Not on file    Outpatient Encounter Medications as of 11/25/2018  Medication Sig  . apixaban (ELIQUIS) 5 MG TABS tablet TAKE 1 TABLET BY MOUTH TWICE DAILY TO HELP PREVENT STROKE RELATED TO ATRIAL FIBRILLATION  . cromolyn (OPTICROM) 4 % ophthalmic solution Place 1 drop into both eyes 4 (four) times daily.  . furosemide (LASIX) 40 MG tablet Take 40 mg by mouth 2 (two) times daily.  Marland Kitchen morphine (MSIR) 15 MG tablet Take 1 tablet (15 mg total) by mouth every 6 (six) hours as needed for severe pain.  Marland Kitchen sulfamethoxazole-trimethoprim (BACTRIM DS,SEPTRA DS) 800-160 MG tablet Take 1 tablet by mouth as needed (for UTI).   No facility-administered encounter medications on file as of 11/25/2018.     Activities of Daily Living In your present state of health, do you have any difficulty performing the following activities: 11/25/2018 02/20/2018  Hearing? Y Y  Comment - hearing aid  Vision? N N  Difficulty concentrating or making decisions? Y Y  Comment - -  Walking or climbing stairs? Y Y  Comment  due to pain  -  Dressing or bathing? N N  Doing errands, shopping? N N  Preparing Food and eating ? Y -  Using the Toilet? N -  In the past six months, have you accidently leaked urine? N -  Do you have problems with loss of bowel control? N -  Managing your Medications? N -  Managing your Finances? N -  Housekeeping or managing your Housekeeping? N -  Some recent data might be hidden    Patient Care Team: Gayland Curry, DO as PCP - General (Geriatric Medicine) Constance Haw, MD as PCP - Cardiology (Cardiology) Tomasita Morrow, NP as Nurse Practitioner (Cardiology) Deneise Lever, MD as Consulting Physician (Pulmonary Disease) Kristeen Miss, MD as Consulting Physician (Neurosurgery) Ronny Bacon, MD as Referring Physician (Surgery)  [name], Fransico Him., MD (General Surgery) Bo Merino, MD as Consulting Physician (Rheumatology) Clent Jacks, MD as Consulting Physician (Ophthalmology)   Assessment:   This is a routine wellness examination for Rufus.  Exercise Activities and Dietary recommendations Current Exercise Habits: The patient does not participate in regular exercise at present  Goals    . move around more     Starting 11/15/2016 I would like to move around more        Fall Risk Fall Risk  11/25/2018 09/11/2018 05/12/2018 01/09/2018 11/20/2017  Falls in the past year? 0 0 No Yes No  Number falls in past yr: 0 0 - 1 -  Injury with Fall? 0 0 - Yes -   Is the patient's home free of loose throw rugs in walkways, pet beds, electrical cords, etc?   no      Grab bars in the bathroom? yes      Handrails on the stairs?   yes      Adequate lighting?   yes  Depression Screen PHQ 2/9 Scores 11/25/2018 09/11/2018 05/12/2018 11/20/2017  PHQ - 2 Score 0 0 0 0  PHQ- 9 Score - - - -    Cognitive Function MMSE - Mini Mental State Exam 11/20/2017 11/15/2016  Orientation to time 3 4  Orientation to Place 5 4  Registration 3 3  Attention/ Calculation 5 5   Recall 2 2  Language- name 2 objects 2 2  Language- repeat 1 1  Language- follow 3 step command 3 3  Language- read & follow direction 1 1  Write a sentence 1 1  Copy design 1 1  Total score 27 27     6CIT Screen 11/25/2018  What Year? 0 points  What month? 0 points  What time? 0 points  Count back from 20 0 points  Months in reverse 4 points  Repeat phrase 4 points  Total Score 8    Immunization History  Administered Date(s) Administered  . Influenza, High Dose Seasonal PF 04/18/2017, 05/12/2018  . Influenza,inj,Quad PF,6+ Mos 06/21/2015  . Pneumococcal Conjugate-13 06/21/2015  . Pneumococcal Polysaccharide-23 11/15/2016    Qualifies for Shingles Vaccine? Declined   Screening Tests Health Maintenance  Topic Date Due  . FOOT EXAM  12/22/2015  . TETANUS/TDAP  11/01/2024 (Originally 08/02/1954)  . INFLUENZA VACCINE  02/21/2019  . HEMOGLOBIN A1C  03/10/2019  . URINE MICROALBUMIN  05/09/2019  . OPHTHALMOLOGY EXAM  09/02/2019  . PNA vac Low Risk Adult  Completed   Cancer Screenings: Lung: Low Dose CT Chest recommended if Age 35-80 years, 30 pack-year currently smoking OR have quit w/in 15years. Patient does not qualify. Colorectal: N/A   Additional Screenings: Hepatitis C Screening: low Risk       Plan:    Recommended:  - Advance Directives  - Zooster  vaccine   I have personally reviewed and noted the following in the patient's chart:   . Medical and social history . Use of alcohol, tobacco or illicit drugs  . Current medications and supplements . Functional ability and status . Nutritional status . Physical activity . Advanced directives . List of other physicians . Hospitalizations, surgeries, and ER visits in previous 12 months . Vitals . Screenings to include cognitive, depression, and falls . Referrals and appointments  In addition, I have reviewed and discussed with patient certain preventive protocols, quality metrics, and best practice  recommendations. A written personalized care plan for preventive services as well as general preventive health recommendations were  provided to patient.   Sandrea Hughs, NP  11/25/2018

## 2018-11-25 NOTE — Progress Notes (Signed)
   This service is provided via telemedicine  No vital signs collected/recorded due to the encounter was a telemedicine visit.   Location of patient (ex: home, work):  Home   Patient consents to a telephone visit:  Yes  Location of the provider (ex: office, home):  Office  Name of any referring provider:  Dr. Hollace Kinnier   Names of all persons participating in the telemedicine service and their role in the encounter:  Ruthell Rummage CMA, [name] Ngetich NP, Rosario Adie and wife   Time spent on call:  Ruthell Rummage CMA,14 minutes on phone with patient

## 2018-12-08 ENCOUNTER — Other Ambulatory Visit: Payer: Self-pay

## 2018-12-08 ENCOUNTER — Ambulatory Visit (INDEPENDENT_AMBULATORY_CARE_PROVIDER_SITE_OTHER): Payer: PPO | Admitting: Internal Medicine

## 2018-12-08 ENCOUNTER — Encounter: Payer: Self-pay | Admitting: Internal Medicine

## 2018-12-08 DIAGNOSIS — M159 Polyosteoarthritis, unspecified: Secondary | ICD-10-CM

## 2018-12-08 DIAGNOSIS — M48062 Spinal stenosis, lumbar region with neurogenic claudication: Secondary | ICD-10-CM | POA: Diagnosis not present

## 2018-12-08 DIAGNOSIS — H9113 Presbycusis, bilateral: Secondary | ICD-10-CM | POA: Diagnosis not present

## 2018-12-08 DIAGNOSIS — G894 Chronic pain syndrome: Secondary | ICD-10-CM

## 2018-12-08 DIAGNOSIS — M5136 Other intervertebral disc degeneration, lumbar region: Secondary | ICD-10-CM

## 2018-12-08 DIAGNOSIS — E1142 Type 2 diabetes mellitus with diabetic polyneuropathy: Secondary | ICD-10-CM | POA: Diagnosis not present

## 2018-12-08 NOTE — Addendum Note (Signed)
Addended by: Despina Hidden on: 12/08/2018 04:16 PM   Modules accepted: Orders

## 2018-12-08 NOTE — Progress Notes (Signed)
Patient ID: Ronnie Jackson, male   DOB: 1936-02-19, 83 y.o.   MRN: [id number] This service is provided via telemedicine  No vital signs collected/recorded due to the encounter was a telemedicine visit.   Location of patient (ex: home, work):  HOME  Patient consents to a telephone visit:  YES  Location of the provider (ex: office, home):  OFFICE  Name of any referring provider:  Kyoko Elsea, DO  Names of all persons participating in the telemedicine service and their role in the encounter:  PATIENT, [place], Anae Hams DO  Time spent on call:  3:14    Provider:  Eliazar Olivar L. Mariea Clonts, D.O., C.M.D.  Goals of Care:  Advanced Directives 11/25/2018  Does Patient Have a Medical Advance Directive? No  Would patient like information on creating a medical advance directive? -     Chief Complaint  Patient presents with  . Medical Management of Chronic Issues    follow-up    HPI: Patient is a 83 y.o. male seen today for medical management of chronic diseases.    He is still in a lot of pain.  He says he used to be on 60mg  with Dr. Nyoka Cowden and I just put him on 15mg  morphine.  Reviewed that he's had his hip replaced since then.  It hurts off and on.  He does have arthritis in his lower back.  His pain is particularly bad today.  He does get out and try to walk some.  His legs and feet hurt.  Says he can't tell he took the 15mg  morphine every 6 hrs--he says he is taking it as directed.    He does not want to take the tylenol despite my recommendation.  Only the morphine helps him.  He says he has jars of topicals--they only last for a few minutes.  He says he hurts so bad he can't sleep at night.  It's affecting his memory.  His mom suffered with her arthritis, too.    He's worried his sugars are sky high.  He's tried to cut down on the cookies.  It's hard for him.    [name] made him an appt for his hearing.  He has to get his hearing retested.    He is also getting quite dizzy here  lately and wonders if it's his sugar.  It's about all the time, not just on standing or walking.  Even sitting in a chair, he might be dizzy.  It's sometimes like the room is spinning.    Past Medical History:  Diagnosis Date  . Anxiety state, unspecified   . Atrial fibrillation ([place]) 03/06/2016  . Chronic pain syndrome   . Constipation   . Depressive disorder, not elsewhere classified   . Dysphagia, unspecified(787.20)   . Herpes simplex disease   . Hyperlipidemia   . Hypertension   . Hypopotassemia   . Insomnia, unspecified   . Intestinovesical fistula   . Malignant neoplasm of prostate ([place])   . Myalgia and myositis, unspecified   . Osteoarthritis   . Pain in joint, lower leg    Bilateral knee pains  . Reflux esophagitis   . Rheumatoid arthritis with rheumatoid factor (HCC)   . Spinal stenosis, unspecified region other than cervical   . Thrombocytopenia, unspecified ([place])   . Type II or unspecified type diabetes mellitus without mention of complication, not stated as uncontrolled    Pt states that it is prediabetic  . Unspecified arthropathy, pelvic region and thigh   .  Unspecified hereditary and idiopathic peripheral neuropathy     Past Surgical History:  Procedure Laterality Date  . CARDIAC CATHETERIZATION  01/31/2004   Dr Gwenlyn Found  . CARDIOVERSION N/A 07/12/2016   Procedure: CARDIOVERSION;  Surgeon: Pixie Casino, MD;  Location: [facility];  Service: Cardiovascular;  Laterality: N/A;  . COLONOSCOPY  08/09/2006   Dr Christian Mate, hemorrhoids/rectal fistula  . cystocopy  07/2006   Dr Harlow Asa  . NASAL SINUS SURGERY  1992  . PROSTATE SURGERY  1991  . TOTAL HIP ARTHROPLASTY Right 02/20/2018   Procedure: RIGHT TOTAL HIP ARTHROPLASTY ANTERIOR APPROACH;  Surgeon: Mcarthur Rossetti, MD;  Location[facility];  Service: Orthopedics;  Laterality: Right;    No Known Allergies  Outpatient Encounter Medications as of 12/08/2018  Medication Sig  . apixaban (ELIQUIS) 5 MG TABS  tablet TAKE 1 TABLET BY MOUTH TWICE DAILY TO HELP PREVENT STROKE RELATED TO ATRIAL FIBRILLATION  . cromolyn (OPTICROM) 4 % ophthalmic solution Place 1 drop into both eyes 4 (four) times daily.  . furosemide (LASIX) 40 MG tablet Take 40 mg by mouth 2 (two) times daily.  Marland Kitchen morphine (MSIR) 15 MG tablet Take 1 tablet (15 mg total) by mouth every 6 (six) hours as needed for severe pain.  Marland Kitchen sulfamethoxazole-trimethoprim (BACTRIM DS,SEPTRA DS) 800-160 MG tablet Take 1 tablet by mouth as needed (for UTI).   No facility-administered encounter medications on file as of 12/08/2018.     Review of Systems:  Review of Systems  Constitutional: Negative for chills, fever and malaise/fatigue.  HENT: Positive for hearing loss. Negative for tinnitus.   Eyes: Negative for blurred vision.       Glasses  Respiratory: Negative for cough and shortness of breath.   Cardiovascular: Negative for chest pain, palpitations and leg swelling.  Gastrointestinal: Negative for abdominal pain, blood in stool, constipation, diarrhea and melena.  Genitourinary: Negative for dysuria.  Musculoskeletal: Positive for back pain and joint pain. Negative for falls.  Skin: Negative for itching and rash.  Neurological: Positive for dizziness. Negative for loss of consciousness and headaches.  Endo/Heme/Allergies: Does not bruise/bleed easily.  Psychiatric/Behavioral: Negative for depression and memory loss. The patient has insomnia. The patient is not nervous/anxious.     Health Maintenance  Topic Date Due  . FOOT EXAM  12/22/2015  . TETANUS/TDAP  11/01/2024 (Originally 08/02/1954)  . INFLUENZA VACCINE  02/21/2019  . HEMOGLOBIN A1C  03/10/2019  . URINE MICROALBUMIN  05/09/2019  . OPHTHALMOLOGY EXAM  09/02/2019  . PNA vac Low Risk Adult  Completed    Physical Exam: Could not be performed as visit non face-to-face via phone   Labs reviewed: Basic Metabolic Panel: Recent Labs    02/21/18 0536 05/08/18 0912 09/09/18 0956   NA 139 141 139  K 4.0 4.1 4.3  CL 102 100 103  CO2 28 32 31  GLUCOSE 206* 96 96  BUN 15 13 15   CREATININE 0.99 1.22* 1.17*  CALCIUM 8.6* 8.6 9.1   Liver Function Tests: Recent Labs    01/08/18 1110 05/08/18 0912 09/09/18 0956  AST 19 18 18   ALT 11 10 9   BILITOT 1.0 0.7 1.2  PROT 6.7 6.7 6.6   No results for input(s): LIPASE, AMYLASE in the last 8760 hours. No results for input(s): AMMONIA in the last 8760 hours. CBC: Recent Labs    01/08/18 1110  02/23/18 0426 05/08/18 0912 09/09/18 0956  WBC 7.3   < > 10.0 7.3 6.4  NEUTROABS 4,672  --   --  3,920 3,174  HGB 12.3*   < > 10.8* 12.2* 12.7*  HCT 36.7*   < > 32.8* 36.4* 37.8*  MCV 89.7   < > 93.2 91.2 92.4  PLT 182   < > 152 163 146   < > = values in this interval not displayed.   Lipid Panel: Recent Labs    01/08/18 1110 05/08/18 0912  CHOL 135 153  HDL 36* 39*  LDLCALC 83 93  TRIG 75 111  CHOLHDL 3.8 3.9   Lab Results  Component Value Date   HGBA1C 6.4 (H) 09/09/2018   Assessment/Plan 1. Generalized osteoarthritis of multiple sites -appears he has OA, but he keeps talking about his family history and his pain seems so severe that I feel like I need to r/o RA though I don't see true inflammation evidence when he's in the office -he also denies providing his with opioids -won't use the tylenol and topicals b/c he says they don't do anything  2. Chronic pain syndrome -cont current morphine -needs drug screen done  3. DDD (degenerative disc disease), lumbar -again encouraged tylenol and topical use on top of the morphine  4. Neurogenic claudication due to lumbar spinal stenosis -reports gabapentin and lyrica not effective, only morphine  5. DM type 2 with diabetic peripheral neuropathy (HCC) -not on meds for this -? Control at this point--pt thinks it's out of control and reason for his dizziness -discouraged his cookie eating and encouraged walking, but he reports his pain is too bad and he needs  more morphine back to 60mg  like Dr. Nyoka Cowden gave him not 15mg  that I just started him on last time after "nothing else worked"  6. Presbycusis of both ears -ongoing, was tested but sounds like too much time went by and now he has to be retested--wife is arranging  Labs/tests ordered:  Get labs as ordered last visit Next appt:  4 mos; fasting labs in am Non face-to-face time spent on televisit:  25 mins  Nao Linz L. Kaleia Longhi, D.O. [facility] [address], [place] [address] Cell Phone (Mon-Fri 8am-5pm):  [phone] On Call:  [phone] & follow prompts after 5pm & weekends Office Phone:  [phone] Office Fax:  [phone]

## 2018-12-09 ENCOUNTER — Other Ambulatory Visit: Payer: PPO

## 2018-12-09 ENCOUNTER — Other Ambulatory Visit: Payer: Self-pay

## 2018-12-09 DIAGNOSIS — E1142 Type 2 diabetes mellitus with diabetic polyneuropathy: Secondary | ICD-10-CM

## 2018-12-09 DIAGNOSIS — G894 Chronic pain syndrome: Secondary | ICD-10-CM

## 2018-12-09 DIAGNOSIS — E7800 Pure hypercholesterolemia, unspecified: Secondary | ICD-10-CM

## 2018-12-09 DIAGNOSIS — M1990 Unspecified osteoarthritis, unspecified site: Secondary | ICD-10-CM

## 2018-12-09 DIAGNOSIS — Z79899 Other long term (current) drug therapy: Secondary | ICD-10-CM

## 2018-12-09 DIAGNOSIS — I4819 Other persistent atrial fibrillation: Secondary | ICD-10-CM | POA: Diagnosis not present

## 2018-12-09 DIAGNOSIS — Z79631 Long term (current) use of antimetabolite agent: Secondary | ICD-10-CM

## 2018-12-10 ENCOUNTER — Other Ambulatory Visit: Payer: Self-pay | Admitting: *Deleted

## 2018-12-10 MED ORDER — FUROSEMIDE 40 MG PO TABS: 40.0000 mg | ORAL_TABLET | Freq: Two times a day (BID) | ORAL | 1 refills | Status: DC

## 2018-12-10 MED ORDER — METHOTREXATE 2.5 MG PO TABS: 7.5000 mg | ORAL_TABLET | ORAL | 5 refills | Status: DC

## 2018-12-10 MED ORDER — FOLIC ACID 1 MG PO TABS: 1.0000 mg | ORAL_TABLET | Freq: Every day | ORAL | 1 refills | Status: DC

## 2018-12-10 MED ORDER — APIXABAN 5 MG PO TABS: ORAL_TABLET | ORAL | 1 refills | Status: DC

## 2018-12-10 MED FILL — FUROSEMIDE 40 MG TAB: 40 | 15 days supply | Qty: 30 | Fill #0

## 2018-12-10 MED FILL — FOLIC ACID 1 MG TABS: 1 | 90 days supply | Qty: 90 | Fill #0

## 2018-12-10 MED FILL — ELIQUIS 5 MG TABLET: 5 | 30 days supply | Qty: 60 | Fill #0

## 2018-12-10 NOTE — Telephone Encounter (Signed)
Patient wife requested refill.

## 2018-12-12 ENCOUNTER — Other Ambulatory Visit: Payer: Self-pay

## 2018-12-12 ENCOUNTER — Telehealth: Payer: Self-pay

## 2018-12-12 DIAGNOSIS — G894 Chronic pain syndrome: Secondary | ICD-10-CM

## 2018-12-12 DIAGNOSIS — M159 Polyosteoarthritis, unspecified: Secondary | ICD-10-CM

## 2018-12-12 MED ORDER — MORPHINE SULFATE 15 MG PO TABS: 15.0000 mg | ORAL_TABLET | Freq: Four times a day (QID) | ORAL | 0 refills | Status: DC | PRN

## 2018-12-12 MED FILL — MORPHINE SULFATE IR 15 MG T: 15 | 30 days supply | Qty: 120 | Fill #0

## 2018-12-12 MED FILL — METHOTREXATE SODIUM 2.5 MG: 2.5 | 4 days supply | Qty: 12 | Fill #0

## 2018-12-12 NOTE — Telephone Encounter (Signed)
Thank you.  I had just gotten that lab test back to confirm the RA diagnosis.

## 2018-12-12 NOTE — Telephone Encounter (Signed)
Incoming fax received from [facility] to initiate a PA for Methotrexate.  PA initiated through CoverMyMeds Key: [id number]  PA Case ID: [id number]  [facility] has received your information, and the request will be reviewed. You may close this dialog, return to your dashboard, and perform other tasks.  You will receive an electronic determination in CoverMyMeds. You can see the latest determination by locating this request on your dashboard or by reopening this request. You will also receive a faxed copy of the determination. If you have any questions please contact [facility] at [phone].  If you need assistance, please chat with CoverMyMeds or call us at [phone].  Awaiting Reply

## 2018-12-12 NOTE — Telephone Encounter (Signed)
Incoming call received from [employer] to clarify Methotrexate Prior Authorization. Per representative the associated diagnosis (G89.4-chronic pain syndrome and M19.90- inflammatory arthritis) will not qualify coverage.  Representative asked if patient has a diagnosis of Rheumatoid Arthritis because Osteoarthritis/imflammatory arthritis (consider the same thing to [facility])  is not an acceptable diagnosis.    I reviewed history and patient had a diagnosis of rheumatoid arthritis w/rheumatoid factor. I provided that code (M05.9). representative will add code and send approval letter via fax. Representative recommended that we add this diagnosis code to RX for future reference if a PA is needed again next year in 2021.  FYI sent to Gayland Curry, DO   Awaiting fax

## 2018-12-12 NOTE — Telephone Encounter (Signed)
Incoming fax received, Methotrexate 2.5 mg tablet approved trough 12/11/2118.  Fax forwarded to [facility] as a FYI to run RX again and prepare for patient pick-up

## 2018-12-12 NOTE — Telephone Encounter (Signed)
[place] Database verified and compliance confirmed   Last filled 11/13/2018

## 2018-12-13 LAB — PAIN MGMT, PROFILE 6 W/CONF, U
6 Acetylmorphine: NEGATIVE ng/mL
Alcohol Metabolites: NEGATIVE ng/mL (ref <500)
Amphetamines: NEGATIVE ng/mL
Barbiturates: NEGATIVE ng/mL
Benzodiazepines: NEGATIVE ng/mL
Cocaine Metabolite: NEGATIVE ng/mL
Codeine: NEGATIVE ng/mL
Creatinine: 167.7 mg/dL
Hydrocodone: NEGATIVE ng/mL
Hydromorphone: 141 ng/mL
Marijuana Metabolite: NEGATIVE ng/mL
Methadone Metabolite: NEGATIVE ng/mL
Morphine: 16567 ng/mL
Norhydrocodone: NEGATIVE ng/mL
Opiates: POSITIVE ng/mL
Oxidant: NEGATIVE ug/mL
Oxycodone: NEGATIVE ng/mL
Phencyclidine: NEGATIVE ng/mL
pH: 5.3 (ref 4.5–9.0)

## 2018-12-13 LAB — COMPLETE METABOLIC PANEL WITH GFR
AG Ratio: 2 (calc) (ref 1.0–2.5)
ALT: 11 U/L (ref 9–46)
AST: 20 U/L (ref 10–35)
Albumin: 4.3 g/dL (ref 3.6–5.1)
Alkaline phosphatase (APISO): 82 U/L (ref 35–144)
BUN/Creatinine Ratio: 16 (calc) (ref 6–22)
BUN: 20 mg/dL (ref 7–25)
CO2: 28 mmol/L (ref 20–32)
Calcium: 8.8 mg/dL (ref 8.6–10.3)
Chloride: 106 mmol/L (ref 98–110)
Creat: 1.26 mg/dL — ABNORMAL HIGH (ref 0.70–1.11)
GFR, Est African American: 62 mL/min/{1.73_m2} (ref >=60)
GFR, Est Non African American: 53 mL/min/{1.73_m2} — ABNORMAL LOW (ref >=60)
Globulin: 2.2 g/dL (calc) (ref 1.9–3.7)
Glucose, Bld: 116 mg/dL — ABNORMAL HIGH (ref 65–99)
Potassium: 4.6 mmol/L (ref 3.5–5.3)
Sodium: 140 mmol/L (ref 135–146)
Total Bilirubin: 0.6 mg/dL (ref 0.2–1.2)
Total Protein: 6.5 g/dL (ref 6.1–8.1)

## 2018-12-13 LAB — CBC WITH DIFFERENTIAL/PLATELET
Absolute Monocytes: 461 cells/uL (ref 200–950)
Basophils Absolute: 32 cells/uL (ref 0–200)
Basophils Relative: 0.5 %
Eosinophils Absolute: 109 cells/uL (ref 15–500)
Eosinophils Relative: 1.7 %
HCT: 36.8 % — ABNORMAL LOW (ref 38.5–50.0)
Hemoglobin: 12.3 g/dL — ABNORMAL LOW (ref 13.2–17.1)
Lymphs Abs: 2464 cells/uL (ref 850–3900)
MCH: 31.5 pg (ref 27.0–33.0)
MCHC: 33.4 g/dL (ref 32.0–36.0)
MCV: 94.1 fL (ref 80.0–100.0)
MPV: 11.6 fL (ref 7.5–12.5)
Monocytes Relative: 7.2 %
Neutro Abs: 3334 cells/uL (ref 1500–7800)
Neutrophils Relative %: 52.1 %
Platelets: 165 10*3/uL (ref 140–400)
RBC: 3.91 10*6/uL — ABNORMAL LOW (ref 4.20–5.80)
RDW: 13.8 % (ref 11.0–15.0)
Total Lymphocyte: 38.5 %
WBC: 6.4 10*3/uL (ref 3.8–10.8)

## 2018-12-13 LAB — LIPID PANEL
Cholesterol: 139 mg/dL (ref <200)
HDL: 36 mg/dL — ABNORMAL LOW (ref >=40)
LDL Cholesterol (Calc): 85 mg/dL (calc)
Non-HDL Cholesterol (Calc): 103 mg/dL (calc) (ref <130)
Total CHOL/HDL Ratio: 3.9 (calc) (ref <5.0)
Triglycerides: 85 mg/dL (ref <150)

## 2018-12-13 LAB — SEDIMENTATION RATE: Sed Rate: 11 mm/h (ref 0–20)

## 2018-12-13 LAB — HEMOGLOBIN A1C
Hgb A1c MFr Bld: 6.6 % of total Hgb — ABNORMAL HIGH (ref <5.7)
Mean Plasma Glucose: 143 (calc)
eAG (mmol/L): 7.9 (calc)

## 2018-12-13 LAB — RHEUMATOID FACTOR: Rheumatoid fact SerPl-aCnc: 19 IU/mL — ABNORMAL HIGH (ref <14)

## 2018-12-26 ENCOUNTER — Telehealth: Payer: Self-pay | Admitting: *Deleted

## 2018-12-26 ENCOUNTER — Telehealth: Payer: Self-pay

## 2018-12-26 NOTE — Telephone Encounter (Signed)
Patient wife called and stated that she wants patient to be tested for COVID. Stated he is not having any symptoms but has been around someone that has recently tested Positive for COVID. Please Advise.

## 2018-12-26 NOTE — Telephone Encounter (Addendum)
Patient called on the number listed and his wife answered, I advised of the call to schedule him for covid testing, she says that he said he doesn't want to go that he feels to bad to go out. She says they tried to convince him and let him know he will not have to get out of the car. She says he said he will wait until her tests come back, then decide.   ----- Message from Gayland Curry, DO sent at 12/26/2018 12:31 PM EDT ----- Regarding: covid test needed Pt is in need of covid testing.  His son had a positive test and they share an [place] visits with him daily.  I've just sent a message for his wife to also be tested. Thanks, Tiffany L. Reed, D.O. [facility] [address], [place] [address] Cell Phone (Mon-Fri 8am-5pm):  [phone] On Call:  [phone] & follow prompts after 5pm & weekends Office Phone:  [phone] Office Fax:  [phone]

## 2018-12-26 NOTE — Telephone Encounter (Signed)
Ok, he had agreed earlier this morning.  Noted.  Thanks.

## 2018-12-26 NOTE — Telephone Encounter (Signed)
Order request was entered to the [facility] community testing pool.  They will be contacting him to arrange testing.

## 2018-12-26 NOTE — Telephone Encounter (Signed)
Error

## 2018-12-31 ENCOUNTER — Other Ambulatory Visit: Payer: Self-pay | Admitting: Internal Medicine

## 2019-01-01 ENCOUNTER — Other Ambulatory Visit: Payer: Self-pay | Admitting: *Deleted

## 2019-01-01 MED ORDER — SULFAMETHOXAZOLE-TRIMETHOPRIM 400-80 MG PO TABS: 1.0000 | ORAL_TABLET | ORAL | 0 refills | Status: DC | PRN

## 2019-01-01 MED FILL — SULFAMETHOXAZOLE-TMP SS TAB: 400-80 | 30 days supply | Qty: 30 | Fill #0

## 2019-01-01 NOTE — Telephone Encounter (Signed)
Is this ok to fill? 

## 2019-01-01 NOTE — Telephone Encounter (Signed)
Received fax refill request from [facility] and sent to Dr. Mariea Clonts for approval due to [place].

## 2019-01-02 ENCOUNTER — Telehealth: Payer: Self-pay | Admitting: *Deleted

## 2019-01-02 NOTE — Telephone Encounter (Signed)
Pt's wife calling asking why pt's prescriptiopn for bactrim was reduced and cut in half? Pt states he needs this everyday because he has a whole in his bladder. Pt was taking BACTRIM DS 800-160 now he's taking BACTRIM 400-80. Please advise

## 2019-01-05 NOTE — Telephone Encounter (Signed)
Pt will finish out this rx and call back

## 2019-01-05 NOTE — Telephone Encounter (Signed)
Ok to change prescription back to what it used to be.  No idea why it came up differently thru e-prescribe.

## 2019-01-09 ENCOUNTER — Other Ambulatory Visit: Payer: Self-pay | Admitting: *Deleted

## 2019-01-09 MED ORDER — APIXABAN 5 MG PO TABS: ORAL_TABLET | ORAL | 1 refills | Status: DC

## 2019-01-09 MED ORDER — FUROSEMIDE 40 MG PO TABS: 40.0000 mg | ORAL_TABLET | Freq: Two times a day (BID) | ORAL | 1 refills | Status: DC

## 2019-01-09 MED FILL — ELIQUIS 5 MG TABLET: 5 | 30 days supply | Qty: 60 | Fill #0

## 2019-01-09 MED FILL — FUROSEMIDE 40 MG TAB: 40 | 30 days supply | Qty: 60 | Fill #0

## 2019-01-09 NOTE — Telephone Encounter (Signed)
Patient wife requested.

## 2019-01-13 ENCOUNTER — Other Ambulatory Visit: Payer: Self-pay | Admitting: *Deleted

## 2019-01-13 DIAGNOSIS — M159 Polyosteoarthritis, unspecified: Secondary | ICD-10-CM

## 2019-01-13 DIAGNOSIS — G894 Chronic pain syndrome: Secondary | ICD-10-CM

## 2019-01-13 MED ORDER — MORPHINE SULFATE 15 MG PO TABS: 15.0000 mg | ORAL_TABLET | Freq: Four times a day (QID) | ORAL | 0 refills | Status: DC | PRN

## 2019-01-13 MED FILL — MORPHINE SULFATE IR 15 MG T: 15 | 30 days supply | Qty: 120 | Fill #0

## 2019-01-13 NOTE — Telephone Encounter (Signed)
Patient requested refill [place] Verified LR: 12/12/2018 Pended Rx and sent to Dr. Sheppard Coil due to Dr. Mariea Clonts out of office.

## 2019-01-22 ENCOUNTER — Other Ambulatory Visit: Payer: Self-pay | Admitting: *Deleted

## 2019-01-22 MED ORDER — SULFAMETHOXAZOLE-TRIMETHOPRIM 400-80 MG PO TABS: 1.0000 | ORAL_TABLET | ORAL | 0 refills | Status: DC | PRN

## 2019-01-22 NOTE — Telephone Encounter (Signed)
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 Dr.Reed reduced the dose and pt wants a stronger dose like Dr. Nyoka Cowden gave him. Please advise

## 2019-01-26 ENCOUNTER — Other Ambulatory Visit: Payer: Self-pay | Admitting: *Deleted

## 2019-01-26 MED ORDER — SULFAMETHOXAZOLE-TRIMETHOPRIM 800-160 MG PO TABS: 1.0000 | ORAL_TABLET | Freq: Two times a day (BID) | ORAL | 3 refills | Status: DC

## 2019-01-26 MED FILL — SULFAMETHOXAZOLE-TMP DS TAB: 800-160 | 30 days supply | Qty: 60 | Fill #0

## 2019-01-26 NOTE — Telephone Encounter (Signed)
Patient wife requested corrected strength and Quantity.   Pended and sent to Dr Mariea Clonts for approval due to [place].

## 2019-02-10 ENCOUNTER — Other Ambulatory Visit: Payer: Self-pay | Admitting: *Deleted

## 2019-02-10 MED ORDER — APIXABAN 5 MG PO TABS: ORAL_TABLET | ORAL | 1 refills | Status: DC

## 2019-02-10 MED FILL — ELIQUIS 5 MG TABLET: 5 | 30 days supply | Qty: 60 | Fill #0

## 2019-02-10 MED FILL — FUROSEMIDE 40 MG TAB: 40 | 30 days supply | Qty: 60 | Fill #1

## 2019-02-10 NOTE — Telephone Encounter (Signed)
Patient wife requested refill.

## 2019-02-16 ENCOUNTER — Other Ambulatory Visit: Payer: Self-pay | Admitting: *Deleted

## 2019-02-16 DIAGNOSIS — G894 Chronic pain syndrome: Secondary | ICD-10-CM

## 2019-02-16 DIAGNOSIS — M159 Polyosteoarthritis, unspecified: Secondary | ICD-10-CM

## 2019-02-16 MED ORDER — MORPHINE SULFATE 15 MG PO TABS: 15.0000 mg | ORAL_TABLET | Freq: Four times a day (QID) | ORAL | 0 refills | Status: DC | PRN

## 2019-02-16 MED FILL — MORPHINE SULFATE IR 15 MG T: 15 | 30 days supply | Qty: 120 | Fill #0

## 2019-02-16 NOTE — Telephone Encounter (Signed)
Patient wife requested [place] Verified LR: 01/13/2019 Pended Rx and sent to Dr. Mariea Clonts for approval.

## 2019-02-26 ENCOUNTER — Other Ambulatory Visit: Payer: Self-pay | Admitting: *Deleted

## 2019-02-26 MED ORDER — FUROSEMIDE 40 MG PO TABS: 40.0000 mg | ORAL_TABLET | Freq: Two times a day (BID) | ORAL | 1 refills | Status: DC

## 2019-02-26 MED ORDER — SULFAMETHOXAZOLE-TRIMETHOPRIM 800-160 MG PO TABS: 1.0000 | ORAL_TABLET | Freq: Two times a day (BID) | ORAL | 3 refills | Status: DC

## 2019-02-26 MED FILL — SULFAMETHOXAZOLE-TMP DS TAB: 800-160 | 30 days supply | Qty: 60 | Fill #1

## 2019-02-26 NOTE — Addendum Note (Signed)
Addended by: Rafael Bihari A on: 02/26/2019 01:17 PM   Modules accepted: Orders

## 2019-02-26 NOTE — Telephone Encounter (Signed)
Patient wife requested refill.

## 2019-02-26 NOTE — Telephone Encounter (Signed)
Patient requested refill Pended and sent to Dr. Mariea Clonts due to [place].

## 2019-03-11 ENCOUNTER — Other Ambulatory Visit: Payer: Self-pay | Admitting: *Deleted

## 2019-03-11 DIAGNOSIS — Z79899 Other long term (current) drug therapy: Secondary | ICD-10-CM

## 2019-03-11 DIAGNOSIS — Z79631 Long term (current) use of antimetabolite agent: Secondary | ICD-10-CM

## 2019-03-11 MED ORDER — FOLIC ACID 1 MG PO TABS: 1.0000 mg | ORAL_TABLET | Freq: Every day | ORAL | 1 refills | Status: DC

## 2019-03-11 MED ORDER — APIXABAN 5 MG PO TABS: ORAL_TABLET | ORAL | 1 refills | Status: DC

## 2019-03-11 MED ORDER — FUROSEMIDE 40 MG PO TABS: 40.0000 mg | ORAL_TABLET | Freq: Two times a day (BID) | ORAL | 1 refills | Status: DC

## 2019-03-11 MED FILL — ELIQUIS 5 MG TABLET: 5 | 30 days supply | Qty: 60 | Fill #0

## 2019-03-11 MED FILL — FUROSEMIDE 40 MG TAB: 40 | 30 days supply | Qty: 60 | Fill #0

## 2019-03-11 MED FILL — FOLIC ACID 1 MG TABS: 1 | 90 days supply | Qty: 90 | Fill #0

## 2019-03-11 NOTE — Telephone Encounter (Signed)
Patient wife requested refills.  

## 2019-03-23 ENCOUNTER — Other Ambulatory Visit: Payer: Self-pay | Admitting: *Deleted

## 2019-03-23 DIAGNOSIS — G894 Chronic pain syndrome: Secondary | ICD-10-CM

## 2019-03-23 DIAGNOSIS — M159 Polyosteoarthritis, unspecified: Secondary | ICD-10-CM

## 2019-03-23 MED ORDER — MORPHINE SULFATE 15 MG PO TABS: 15.0000 mg | ORAL_TABLET | Freq: Four times a day (QID) | ORAL | 0 refills | Status: DC | PRN

## 2019-03-23 MED FILL — MORPHINE SULFATE IR 15 MG T: 15 | 30 days supply | Qty: 120 | Fill #0

## 2019-03-23 NOTE — Telephone Encounter (Signed)
Patient requested refill [place] Verified LR: 02/16/19 Pended Rx and sent to Dr. Mariea Clonts for approval.

## 2019-04-08 MED FILL — ELIQUIS 5 MG TABLET: 5 | 30 days supply | Qty: 60 | Fill #1

## 2019-04-08 MED FILL — SULFAMETHOXAZOLE-TMP DS TAB: 800-160 | 30 days supply | Qty: 60 | Fill #2

## 2019-04-23 ENCOUNTER — Other Ambulatory Visit: Payer: Self-pay | Admitting: *Deleted

## 2019-04-23 DIAGNOSIS — M159 Polyosteoarthritis, unspecified: Secondary | ICD-10-CM

## 2019-04-23 DIAGNOSIS — G894 Chronic pain syndrome: Secondary | ICD-10-CM

## 2019-04-23 MED ORDER — MORPHINE SULFATE 15 MG PO TABS: 15.0000 mg | ORAL_TABLET | Freq: Four times a day (QID) | ORAL | 0 refills | Status: DC | PRN

## 2019-04-23 MED FILL — MORPHINE SULFATE IR 15 MG T: 15 | 30 days supply | Qty: 120 | Fill #0

## 2019-04-23 NOTE — Telephone Encounter (Signed)
Patient requested refill [place] Verified LR: 03/23/2019 Pended Rx and sent to [place] for approval.  Dr. Mariea Clonts out of office.

## 2019-04-29 MED FILL — FUROSEMIDE 40 MG TAB: 40 | 30 days supply | Qty: 60 | Fill #1

## 2019-05-12 ENCOUNTER — Other Ambulatory Visit: Payer: Self-pay | Admitting: *Deleted

## 2019-05-12 MED ORDER — APIXABAN 5 MG PO TABS: ORAL_TABLET | ORAL | 3 refills | Status: DC

## 2019-05-12 MED FILL — ELIQUIS 5 MG TABLET: 5 | 30 days supply | Qty: 60 | Fill #0

## 2019-05-12 NOTE — Telephone Encounter (Signed)
Patient wife requested refill Faxed to pharmacy.

## 2019-05-21 ENCOUNTER — Other Ambulatory Visit: Payer: Self-pay | Admitting: *Deleted

## 2019-05-21 DIAGNOSIS — M159 Polyosteoarthritis, unspecified: Secondary | ICD-10-CM

## 2019-05-21 DIAGNOSIS — G894 Chronic pain syndrome: Secondary | ICD-10-CM

## 2019-05-21 MED ORDER — MORPHINE SULFATE 15 MG PO TABS: 15.0000 mg | ORAL_TABLET | Freq: Four times a day (QID) | ORAL | 0 refills | Status: DC | PRN

## 2019-05-21 MED FILL — MORPHINE SULFATE IR 15 MG T: 15 | 30 days supply | Qty: 120 | Fill #0

## 2019-05-21 NOTE — Telephone Encounter (Signed)
Patient wife requested refill [place] Verified LR: 04/23/2019 Pended Rx and sent to Dr. Mariea Clonts for approval.

## 2019-06-08 MED FILL — FUROSEMIDE 40 MG TAB: 40 | 30 days supply | Qty: 60 | Fill #0

## 2019-06-09 ENCOUNTER — Other Ambulatory Visit: Payer: Self-pay | Admitting: *Deleted

## 2019-06-09 MED ORDER — FUROSEMIDE 40 MG PO TABS: 40.0000 mg | ORAL_TABLET | Freq: Two times a day (BID) | ORAL | 1 refills | Status: DC

## 2019-06-09 NOTE — Telephone Encounter (Signed)
[facility] 

## 2019-06-10 ENCOUNTER — Telehealth: Payer: Self-pay | Admitting: *Deleted

## 2019-06-10 MED ORDER — APIXABAN 5 MG PO TABS: ORAL_TABLET | ORAL | 0 refills | Status: DC

## 2019-06-10 MED FILL — ELIQUIS 5 MG TABLET: 5 | 15 days supply | Qty: 30 | Fill #0

## 2019-06-10 NOTE — Telephone Encounter (Signed)
Ronnie Jackson, Insurance Agent called and stated that patient is in the doughnut hole and cannot afford the whole supply of Eliquis $90. Stated that patient needs 1/2 of it called into pharmacy. Faxed Rx.

## 2019-06-16 ENCOUNTER — Telehealth: Payer: Self-pay | Admitting: *Deleted

## 2019-06-16 NOTE — Telephone Encounter (Signed)
Received Patient Assistance Form From [facility] [phone] Fax: [phone] for patient's Eliquis.  Filled out and placed in Dr. Cyndi Lennert folder for Dr. Mariea Clonts to review and sign.   Please call patient once signed by Dr. Mariea Clonts so they can fill in their portion and sign.

## 2019-06-22 ENCOUNTER — Other Ambulatory Visit: Payer: Self-pay | Admitting: Internal Medicine

## 2019-06-22 ENCOUNTER — Other Ambulatory Visit: Payer: Self-pay

## 2019-06-22 DIAGNOSIS — G894 Chronic pain syndrome: Secondary | ICD-10-CM

## 2019-06-22 DIAGNOSIS — K632 Fistula of intestine: Secondary | ICD-10-CM

## 2019-06-22 DIAGNOSIS — M159 Polyosteoarthritis, unspecified: Secondary | ICD-10-CM

## 2019-06-22 MED ORDER — SULFAMETHOXAZOLE-TRIMETHOPRIM 800-160 MG PO TABS: 1.0000 | ORAL_TABLET | Freq: Two times a day (BID) | ORAL | 3 refills | Status: DC

## 2019-06-22 MED ORDER — MORPHINE SULFATE 15 MG PO TABS: 15.0000 mg | ORAL_TABLET | Freq: Four times a day (QID) | ORAL | 0 refills | Status: DC | PRN

## 2019-06-22 MED ORDER — APIXABAN 5 MG PO TABS: ORAL_TABLET | ORAL | 3 refills | Status: DC

## 2019-06-22 MED FILL — MORPHINE SULFATE 15 MG TABS: 15 | 30 days supply | Qty: 120 | Fill #0

## 2019-06-22 MED FILL — SULFAMETHOXAZOLE-TMP DS TAB: 800-160 | 60 days supply | Qty: 60 | Fill #0

## 2019-06-23 MED FILL — ELIQUIS 5 MG TABLET: 5 | 15 days supply | Qty: 30 | Fill #1

## 2019-06-29 NOTE — Telephone Encounter (Signed)
Received fax from [facility] Patient Assistance stating that patient IS NOT eligible to receive Eliquis due to the following reason: The requested medication is covered by the patient's insurance plan.

## 2019-07-09 ENCOUNTER — Other Ambulatory Visit: Payer: Self-pay | Admitting: *Deleted

## 2019-07-09 MED ORDER — APIXABAN 5 MG PO TABS: ORAL_TABLET | ORAL | 0 refills | Status: DC

## 2019-07-09 NOTE — Telephone Encounter (Signed)
Patient wife requested. Faxed.

## 2019-07-10 MED FILL — ELIQUIS 5 MG TABLET: 5 | 15 days supply | Qty: 30 | Fill #2

## 2019-07-22 ENCOUNTER — Other Ambulatory Visit: Payer: Self-pay | Admitting: *Deleted

## 2019-07-22 DIAGNOSIS — M159 Polyosteoarthritis, unspecified: Secondary | ICD-10-CM

## 2019-07-22 DIAGNOSIS — G894 Chronic pain syndrome: Secondary | ICD-10-CM

## 2019-07-22 MED ORDER — MORPHINE SULFATE 15 MG PO TABS: 15.0000 mg | ORAL_TABLET | Freq: Four times a day (QID) | ORAL | 0 refills | Status: DC | PRN

## 2019-07-22 MED ORDER — FUROSEMIDE 40 MG PO TABS: 40.0000 mg | ORAL_TABLET | Freq: Two times a day (BID) | ORAL | 1 refills | Status: DC

## 2019-07-22 MED FILL — MORPHINE SULFATE 15 MG TABS: 15 | 30 days supply | Qty: 120 | Fill #0

## 2019-07-22 MED FILL — FUROSEMIDE 40 MG TAB: 40 | 30 days supply | Qty: 60 | Fill #0

## 2019-07-22 NOTE — Telephone Encounter (Signed)
Patient wife requested Narcotic Contract updated [place] Verified LR: 06/22/2019  Pended Rx for approval and sent to Dr. Lyndel Safe due to Dr. Mariea Clonts out of office.

## 2019-07-27 ENCOUNTER — Other Ambulatory Visit: Payer: Self-pay | Admitting: *Deleted

## 2019-07-27 MED ORDER — APIXABAN 5 MG PO TABS: ORAL_TABLET | ORAL | 1 refills | Status: DC

## 2019-07-27 MED FILL — ELIQUIS 5 MG TABLET: 5 | 30 days supply | Qty: 60 | Fill #0

## 2019-07-27 NOTE — Telephone Encounter (Signed)
Patient wife requested refill Rx sent to Pharmacy

## 2019-08-20 ENCOUNTER — Telehealth: Payer: Self-pay | Admitting: *Deleted

## 2019-08-20 NOTE — Telephone Encounter (Signed)
[place] called and stated that patient is enrolled in Chronic Special Needs Plan and she is wanting to confirm patient has a diagnosis of Diabetes.  Dx given.

## 2019-08-24 ENCOUNTER — Other Ambulatory Visit: Payer: Self-pay | Admitting: *Deleted

## 2019-08-24 MED ORDER — FUROSEMIDE 40 MG PO TABS: 40.0000 mg | ORAL_TABLET | Freq: Two times a day (BID) | ORAL | 1 refills | Status: DC

## 2019-08-24 MED ORDER — APIXABAN 5 MG PO TABS: ORAL_TABLET | ORAL | 1 refills | Status: DC

## 2019-08-24 MED FILL — ELIQUIS 5 MG TABLET: 5 | 30 days supply | Qty: 60 | Fill #0

## 2019-08-24 MED FILL — FUROSEMIDE 40 MG TAB: 40 | 30 days supply | Qty: 60 | Fill #0

## 2019-08-24 NOTE — Telephone Encounter (Signed)
Patient requested refills.

## 2019-09-01 DIAGNOSIS — Z961 Presence of intraocular lens: Secondary | ICD-10-CM | POA: Diagnosis not present

## 2019-09-01 DIAGNOSIS — H04123 Dry eye syndrome of bilateral lacrimal glands: Secondary | ICD-10-CM | POA: Diagnosis not present

## 2019-09-01 DIAGNOSIS — E119 Type 2 diabetes mellitus without complications: Secondary | ICD-10-CM | POA: Diagnosis not present

## 2019-09-01 DIAGNOSIS — H353131 Nonexudative age-related macular degeneration, bilateral, early dry stage: Secondary | ICD-10-CM | POA: Diagnosis not present

## 2019-09-01 DIAGNOSIS — H1045 Other chronic allergic conjunctivitis: Secondary | ICD-10-CM | POA: Diagnosis not present

## 2019-09-01 DIAGNOSIS — Z0100 Encounter for examination of eyes and vision without abnormal findings: Secondary | ICD-10-CM

## 2019-09-01 HISTORY — DX: Encounter for examination of eyes and vision without abnormal findings: Z01.00

## 2019-09-01 LAB — HM DIABETES EYE EXAM

## 2019-09-07 ENCOUNTER — Encounter: Payer: Self-pay | Admitting: Internal Medicine

## 2019-09-14 ENCOUNTER — Other Ambulatory Visit: Payer: Self-pay | Admitting: *Deleted

## 2019-09-14 DIAGNOSIS — M159 Polyosteoarthritis, unspecified: Secondary | ICD-10-CM

## 2019-09-14 DIAGNOSIS — G894 Chronic pain syndrome: Secondary | ICD-10-CM

## 2019-09-14 MED ORDER — MORPHINE SULFATE 15 MG PO TABS: 15.0000 mg | ORAL_TABLET | Freq: Four times a day (QID) | ORAL | 0 refills | Status: DC | PRN

## 2019-09-14 MED FILL — MORPHINE SULFATE 15 MG TABS: 15 | 7 days supply | Qty: 30 | Fill #0

## 2019-09-14 NOTE — Telephone Encounter (Signed)
[name], wife requested refill [place] Verified LR: 07/22/19 Last Narcotic Contract signed 09/11/18

## 2019-09-16 ENCOUNTER — Telehealth: Payer: Self-pay | Admitting: *Deleted

## 2019-09-16 NOTE — Telephone Encounter (Signed)
Received fax from [place] for Prior Authorization for patient's medication Morphine Sulfate 15mg .  Initiated through [facility]. Went into determination with a 48-72 hour determination.  KEYBrett Albino PA Case: [id number]

## 2019-09-17 NOTE — Telephone Encounter (Signed)
Received fax from [place] and Morphine Sulfate 15mg  is APPROVED

## 2019-09-21 ENCOUNTER — Other Ambulatory Visit: Payer: Self-pay | Admitting: *Deleted

## 2019-09-21 ENCOUNTER — Other Ambulatory Visit: Payer: Self-pay | Admitting: Internal Medicine

## 2019-09-21 DIAGNOSIS — G894 Chronic pain syndrome: Secondary | ICD-10-CM

## 2019-09-21 DIAGNOSIS — M159 Polyosteoarthritis, unspecified: Secondary | ICD-10-CM

## 2019-09-21 NOTE — Telephone Encounter (Signed)
System is not allowing me to sign this for some reason.  It is saying it cannot be digitally prescribed.

## 2019-09-21 NOTE — Telephone Encounter (Signed)
Script was filled on 09/14/19

## 2019-09-21 NOTE — Telephone Encounter (Signed)
[name], wife called and stated that they needs a refill on Morphine.  [place] Verified LR: 09/14/19 #30 (7 day supply) Needs refill, Prior Authorization done and insurance stated approved through the end of March.  Pended Rx and sent to Dr. Mariea Clonts for approval.

## 2019-09-22 MED FILL — MORPHINE SULFATE IR 15 MG T: 15 | 30 days supply | Qty: 120 | Fill #0

## 2019-09-22 NOTE — Telephone Encounter (Signed)
Per Pharmacy see below:  Patient only received 30 tablets due to a restriction with the insurance plan. They require a new Rx for further fills. Can you send a new Rx? TY

## 2019-09-22 NOTE — Telephone Encounter (Signed)
I renewed the prescription this morning--it worked today.  I had gotten more than one message about it.

## 2019-09-23 MED FILL — ELIQUIS 5 MG TABLET: 5 | 30 days supply | Qty: 60 | Fill #1

## 2019-09-23 MED FILL — FUROSEMIDE 40 MG TAB: 40 | 30 days supply | Qty: 60 | Fill #1

## 2019-09-24 ENCOUNTER — Other Ambulatory Visit: Payer: Self-pay | Admitting: *Deleted

## 2019-09-24 MED ORDER — APIXABAN 5 MG PO TABS: ORAL_TABLET | ORAL | 1 refills | Status: DC

## 2019-09-24 NOTE — Telephone Encounter (Signed)
[facility] 

## 2019-10-07 ENCOUNTER — Other Ambulatory Visit: Payer: Self-pay | Admitting: *Deleted

## 2019-10-07 NOTE — Patient Outreach (Signed)
  [facility]) Care Management Chronic Special Needs Program    10/07/2019  Name: DERRYN APA, DOB: 12/21/1935  MRN: [id number]   Ronnie Jackson is enrolled in a chronic special needs plan for diabetes.  Health risk assessment completed previously by client.  Interdisciplinary care plan created from information in electronic medical record.  Client also has history of atrial fibrillation.  RN care manager will send introductory letter with interdisciplinary care plan to primary care provider and client, along with education materials.  Assigned RN care manager will follow up within 3 months.  Goals    . Client understands the importance of follow-up with providers by attending scheduled visits     Client saw primary care provider for 2 visits in 2020 Continue to follow up with primary care provider     . Client will report no worsening of symptoms of Atrial Fibrillation within the next 3 months     RN care manager mailed EMMI education article "Living with Atrial fibrillation" Report any symptoms such as dizziness, fast heart rate or fluttering to your doctor Please continue to follow up with primary care provider Take all medications as prescribed Maintain a healthy weight    . HEMOGLOBIN A1C < 7.0     Per medical record review- Hgb AIC 6.6 on 12/09/18 Visit your doctor every 3-6 months as directed Hgb AIC level checked every 3-6 months Eye exam yearly Check feet daily Carbohydrate controlled meal planning Take medications as prescribed Physical activity RN care manager mailed to client- EMMI education articles "Diabetes care checklist" and " Carbohydrate counting diet"    . Obtain annual  Lipid Profile, LDL-C     Per medical record review- Lipid panel completed 12/09/18   LDL= 85 The goal for LDL is less than 70 mg/dl as you are at high risk for complications Try to avoid saturated fats, trans-fats and eat more fiber Follow up with primary care provider and  complete lab work as ordered    . Obtain Annual Eye (retinal)  Exam      Per medical record review- eye exam completed 09/01/19 Plan to have dilated eye exam every year    . Obtain Annual Foot Exam     Please have doctor check your feet at least yearly Plan to keep scheduled appointments with providers    . Obtain annual screen for micro albuminuria (urine) , nephropathy (kidney problems)     MicroAlbumin Urine checked 05/08/18 per medical record review Please have microalbuminuria checked at least yearly Please keep all scheduled appointments with providers    . Obtain Hemoglobin A1C at least 2 times per year     Hgb AIC checked 2 times in 2020 per medical record review Continue following up with your primary care provider and have lab work as ordered    . Visit Primary Care Provider or Endocrinologist at least 2 times per year      Client saw primary care provider for 2 visits in 2020 Continue to follow up with primary care provider Call and make any needed appointments Take medications as prescribed      Jacqlyn Larsen [facility], [place] Coordinator [phone]

## 2019-10-08 MED FILL — SULFAMETHOXAZOLE-TMP DS TAB: 800-160 | 60 days supply | Qty: 60 | Fill #1

## 2019-10-13 ENCOUNTER — Other Ambulatory Visit: Payer: Self-pay | Admitting: *Deleted

## 2019-10-13 DIAGNOSIS — M159 Polyosteoarthritis, unspecified: Secondary | ICD-10-CM

## 2019-10-13 DIAGNOSIS — G894 Chronic pain syndrome: Secondary | ICD-10-CM

## 2019-10-13 NOTE — Telephone Encounter (Signed)
Will he always get only 30 at a time?  If that's the case, we'll need to revise his prescriptions going forward.  Thank you for making him an appt.

## 2019-10-13 NOTE — Telephone Encounter (Signed)
Per [place]: Patient only received 30 tablets due to a restriction with the insurance plan. They require a new Rx for further fills. Can you send a new Rx? TY   [name], wife called for refill. See note from pharmacy above.  [place] Verified LR: 09/22/2019 Pended Rx and sent to Dr. Mariea Clonts for approval.   Patient due for an appointment and Narcotic Contract update. Scheduled an appointment 10/19/19 with Dr. Mariea Clonts.

## 2019-10-13 NOTE — Telephone Encounter (Signed)
Called and spoke with Aaron Edelman with [facility] and he stated that #30 on 2/22 was due to probably a new Calendar year. Just filled #120 on 3/2 and it went through.  Patient is not due for a refill for another week. [name] Notified. Will Pend next Wednesday 3/31 for approval.

## 2019-10-19 ENCOUNTER — Other Ambulatory Visit: Payer: Self-pay

## 2019-10-19 ENCOUNTER — Encounter: Payer: Self-pay | Admitting: Internal Medicine

## 2019-10-19 ENCOUNTER — Ambulatory Visit (INDEPENDENT_AMBULATORY_CARE_PROVIDER_SITE_OTHER): Payer: HMO | Admitting: Internal Medicine

## 2019-10-19 VITALS — BP 130/78 | HR 64 | Temp 97.7°F | Ht 70.0 in | Wt 209.0 lb

## 2019-10-19 DIAGNOSIS — M48062 Spinal stenosis, lumbar region with neurogenic claudication: Secondary | ICD-10-CM

## 2019-10-19 DIAGNOSIS — M159 Polyosteoarthritis, unspecified: Secondary | ICD-10-CM | POA: Diagnosis not present

## 2019-10-19 DIAGNOSIS — M4820 Kissing spine, site unspecified: Secondary | ICD-10-CM | POA: Diagnosis not present

## 2019-10-19 DIAGNOSIS — E1142 Type 2 diabetes mellitus with diabetic polyneuropathy: Secondary | ICD-10-CM

## 2019-10-19 DIAGNOSIS — Z23 Encounter for immunization: Secondary | ICD-10-CM | POA: Diagnosis not present

## 2019-10-19 DIAGNOSIS — G894 Chronic pain syndrome: Secondary | ICD-10-CM | POA: Diagnosis not present

## 2019-10-19 DIAGNOSIS — H9113 Presbycusis, bilateral: Secondary | ICD-10-CM

## 2019-10-19 DIAGNOSIS — E7800 Pure hypercholesterolemia, unspecified: Secondary | ICD-10-CM | POA: Diagnosis not present

## 2019-10-19 NOTE — Progress Notes (Signed)
Location:  [facility] clinic Provider:  Jimmy Plessinger L. Mariea Clonts, D.O., C.M.D.  Goals of Care:  Advanced Directives 10/19/2019  Does Patient Have a Medical Advance Directive? No  Would patient like information on creating a medical advance directive? No - Patient declined     Chief Complaint  Patient presents with  . Medication Management    medication mangement and to sign narcotic contract,     HPI: Patient is a 84 y.o. male seen today for medical management of chronic diseases.  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 Nyoka Cowden, his prior PCP.    He says he's hurting all over with arthritis.  He can't do much b/c he hurts in his hands, his hips, his back.  He took methotrexate and folic acid for a few days and was adamant it did nothing.  He's been tried on nsaids, tylenol, neuropathic pain meds all without good response.  He's been reading the internet and is now convinced that hydroxychoroquine will cure him (note that methotrexate did not help and steroids have not helped except to increase his glucose).   I wound up putting him back on his morphine but at a much lower dose than he'd been on because he was in such distress.  Note that in May of last year, his sed rate was 11 and his rheumatoid factor was mildly elevated at 59 which was why I tried the methotrexate along with his history that he was told he had RA at some point by his prior PCP and when he transitioned to me, I was unable to see any of the info b/c it was in an old EMR.    He's got a hearing aid, but he forgot to get it charged so he's still not hearing well.  I was able to actually talk to him today though vs his wife yelling everything into his ear after I said it or having to write it down.    He does not eat as much as he did before.  He was supposed to be  seen last sept, but has not been here since may of '20 and has not had the ordered labs before his visit as requested.    Past Medical History:  Diagnosis Date  . Anxiety state, unspecified   . Atrial fibrillation ([place]) 03/06/2016  . Chronic pain syndrome   . Constipation   . Depressive disorder, not elsewhere classified   . Dysphagia, unspecified(787.20)   . Eye exam normal 09/01/2019   Diabetic eye exam did not reveal any retinopathy.   . Herpes simplex disease   . Hyperlipidemia   . Hypertension   . Hypopotassemia   . Insomnia, unspecified   . Intestinovesical fistula   . Malignant neoplasm of prostate ([place])   . Myalgia and myositis, unspecified   . Osteoarthritis   . Pain in joint, lower leg    Bilateral knee pains  . Reflux esophagitis   . Rheumatoid arthritis with rheumatoid factor (HCC)   . Spinal stenosis, unspecified region other than cervical   . Thrombocytopenia, unspecified ([place])   . Type II or unspecified type diabetes mellitus without mention of complication, not stated as uncontrolled    Pt states that it is prediabetic  . Unspecified arthropathy, pelvic region and thigh   . Unspecified hereditary and idiopathic peripheral  neuropathy     Past Surgical History:  Procedure Laterality Date  . CARDIAC CATHETERIZATION  01/31/2004   Dr Gwenlyn Found  . CARDIOVERSION N/A 07/12/2016   Procedure: CARDIOVERSION;  Surgeon: Pixie Casino, MD;  Location: [facility];  Service: Cardiovascular;  Laterality: N/A;  . COLONOSCOPY  08/09/2006   Dr Christian Mate, hemorrhoids/rectal fistula  . cystocopy  07/2006   Dr Harlow Asa  . NASAL SINUS SURGERY  1992  . PROSTATE SURGERY  1991  . TOTAL HIP ARTHROPLASTY Right 02/20/2018   Procedure: RIGHT TOTAL HIP ARTHROPLASTY ANTERIOR APPROACH;  Surgeon: Mcarthur Rossetti, MD;  Location: [facility];  Service: Orthopedics;  Laterality: Right;    No Known Allergies  Outpatient Encounter Medications as of 10/19/2019  Medication Sig  . apixaban  (ELIQUIS) 5 MG TABS tablet Take one tablet by mouth twice daily to help prevent stroke related to atrial fibrillation  . cromolyn (OPTICROM) 4 % ophthalmic solution Place 1 drop into both eyes 4 (four) times daily.  . furosemide (LASIX) 40 MG tablet Take 1 tablet (40 mg total) by mouth 2 (two) times daily.  Mar[place] Kitchen morphine (MSIR) 15 MG tablet TAKE 1 TABLET BY MOUTH EVERY 6 HOURS AS NEEDED FOR SEVERE PAIN  . sulfamethoxazole-trimethoprim (BACTRIM DS) 800-160 MG tablet Take 1 tablet by mouth 2 (two) times daily.  . [DISCONTINUED] folic acid (FOLVITE) 1 MG tablet Take 1 tablet (1 mg total) by mouth daily.  . [DISCONTINUED] methotrexate (RHEUMATREX) 2.5 MG tablet Take 3 tablets (7.5 mg total) by mouth once a week. Caution:Chemotherapy. Protect from light.   No facility-administered encounter medications on file as of 10/19/2019.    Review of Systems:  Review of Systems  Constitutional: Positive for malaise/fatigue. Negative for chills and fever.  HENT: Positive for hearing loss. Negative for congestion and sore throat.   Eyes: Negative for blurred vision.  Respiratory: Negative for cough and shortness of breath.   Cardiovascular: Positive for leg swelling. Negative for chest pain and palpitations.  Gastrointestinal: Negative for abdominal pain and constipation.  Genitourinary: Negative for dysuria.  Musculoskeletal: Positive for back pain and joint pain. Negative for falls.  Skin: Negative for rash.  Neurological: Positive for tingling and sensory change. Negative for dizziness and loss of consciousness.  Psychiatric/Behavioral: Positive for memory loss. Negative for depression. The patient is not nervous/anxious and does not have insomnia.     Health Maintenance  Topic Date Due  . FOOT EXAM  12/22/2015  . INFLUENZA VACCINE  02/21/2019  . URINE MICROALBUMIN  05/09/2019  . HEMOGLOBIN A1C  06/11/2019  . TETANUS/TDAP  11/01/2024 (Originally 08/02/1954)  . OPHTHALMOLOGY EXAM  08/31/2020  . PNA vac  Low Risk Adult  Completed    Physical Exam: Vitals:   10/19/19 1430  BP: 130/78  Pulse: 64  Temp: 97.7 F (36.5 C)  TempSrc: Temporal  SpO2: 98%  Weight: 209 lb (94.8 kg)  Height: 5\' 10"  (1.778 m)   Body mass index is 29.99 kg/m. Physical Exam Constitutional:      General: He is not in acute distress.    Appearance: Normal appearance. He is obese. He is not toxic-appearing.  HENT:     Head: Normocephalic and atraumatic.     Ears:     Comments: Right hearing aid in place and helping a little bit Eyes:     Comments: Not wearing his glasses  Cardiovascular:     Rate and Rhythm: Normal rate and regular rhythm.     Pulses: Normal pulses.  Heart sounds: Normal heart sounds.  Pulmonary:     Effort: Pulmonary effort is normal.     Breath sounds: Normal breath sounds. No rales.  Abdominal:     General: Bowel sounds are normal.     Palpations: Abdomen is soft.  Musculoskeletal:        General: Tenderness present.     Right lower leg: Edema present.     Left lower leg: Edema (mild nonpitting of both legs) present.     Comments: Some soreness of lower back, finger joints with heberden's and bouchard's notes, but no active inflammation  Skin:    General: Skin is warm and dry.  Neurological:     General: No focal deficit present.     Mental Status: He is alert and oriented to person, place, and time.     Cranial Nerves: No cranial nerve deficit.  Psychiatric:        Mood and Affect: Mood normal.     Comments: Very defensive about his morphine and insists that he had to come today b/c he requested it accidentally early (we wanted him to come b/c he'd not been here and his contract was expired)     Labs reviewed: Basic Metabolic Panel: Recent Labs    12/09/18 0855  NA 140  K 4.6  CL 106  CO2 28  GLUCOSE 116*  BUN 20  CREATININE 1.26*  CALCIUM 8.8   Liver Function Tests: Recent Labs    12/09/18 0855  AST 20  ALT 11  BILITOT 0.6  PROT 6.5   No results for  input(s): LIPASE, AMYLASE in the last 8760 hours. No results for input(s): AMMONIA in the last 8760 hours. CBC: Recent Labs    12/09/18 0855  WBC 6.4  NEUTROABS 3,334  HGB 12.3*  HCT 36.8*  MCV 94.1  PLT 165   Lipid Panel: Recent Labs    12/09/18 0855  CHOL 139  HDL 36*  LDLCALC 85  TRIG 85  CHOLHDL 3.9   Lab Results  Component Value Date   HGBA1C 6.6 (H) 12/09/2018     Assessment/Plan 1. Generalized osteoarthritis of multiple sites - has never had exam findings to me of concern for RA, but he is adamant he has this and will get better with hydroxychloroquine -did not improve with steroids or methotrexate (or anything but high dose morphine historically) -has previously seen Dr. Kathee Delton way back and another rheumatologist whose name he does not recall -he's had his hip replaced which was a major contributor to pain but continues with the same pains nonetheless after meds weaned postop - Ambulatory referral to Rheumatology  2. Chronic pain syndrome - referred to rheum to get a final answer on his arthritic condition because patient is not accepting what I'm saying -if he does not have an autoimmune component, I plan to refer him to pain mgt to handle his pain b/c he's not doing well with what I'm doing - Ambulatory referral to Rheumatology - Pain Mgmt, Profile 1 w/o Conf, U  3. 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 Localized idiopathic skeletal hyperostosis - noted on spinal imaging, continues on MSIR 15mg  q 6 hrs prn severe pain -had been on high doses (like 60mg  q 6 hrs plus longacting at one time and still c/o severe pain) - Ambulatory referral to Rheumatology - Pain Mgmt, Profile 1 w/o Conf, U  5. DM type 2 with diabetic peripheral neuropathy (HCC) - no recent labs or visit so checked today: - Hemoglobin A1c - COMPLETE METABOLIC  PANEL WITH GFR - CBC with Differential/Platelet - Microalbumin / creatinine urine ratio  6. Presbycusis of both ears -doing better with hearing aid and reminded to charge before he goes to appts so he can hear what's going on   7. Pure hypercholesterolemia - f/u labs: - Lipid panel  8. Need for influenza vaccination - hadn't been here since last spring so still needed flu shot today: - Flu vaccine HIGH DOSE PF (Fluzone High dose)  Labs/tests ordered:  Lab Orders     Hemoglobin A1c     COMPLETE METABOLIC PANEL WITH GFR     CBC with Differential/Platelet     Lipid panel     Microalbumin / creatinine urine ratio     Pain Mgmt, Profile 1 w/o Conf, U  Next appt: 6 mos for med mgt with fasting labs same day (b/c does not come ahead if ordered ahead)--pt left w/o making his appt again  Parthena Fergeson L. Rayshawn Maney, D.O. [facility] [address], [place] [address] Cell Phone (Mon-Fri 8am-5pm):  [phone] On Call:  [phone] & follow prompts after 5pm & weekends Office Phone:  [phone] Office Fax:  [phone]

## 2019-10-20 LAB — LIPID PANEL
Cholesterol: 162 mg/dL (ref <200)
HDL: 41 mg/dL (ref >=40)
LDL Cholesterol (Calc): 103 mg/dL (calc) — ABNORMAL HIGH
Non-HDL Cholesterol (Calc): 121 mg/dL (calc) (ref <130)
Total CHOL/HDL Ratio: 4 (calc) (ref <5.0)
Triglycerides: 89 mg/dL (ref <150)

## 2019-10-20 LAB — COMPLETE METABOLIC PANEL WITH GFR
AG Ratio: 1.7 (calc) (ref 1.0–2.5)
ALT: 11 U/L (ref 9–46)
AST: 21 U/L (ref 10–35)
Albumin: 4.8 g/dL (ref 3.6–5.1)
Alkaline phosphatase (APISO): 82 U/L (ref 35–144)
BUN/Creatinine Ratio: 13 (calc) (ref 6–22)
BUN: 16 mg/dL (ref 7–25)
CO2: 29 mmol/L (ref 20–32)
Calcium: 9.2 mg/dL (ref 8.6–10.3)
Chloride: 102 mmol/L (ref 98–110)
Creat: 1.22 mg/dL — ABNORMAL HIGH (ref 0.70–1.11)
GFR, Est African American: 64 mL/min/{1.73_m2} (ref >=60)
GFR, Est Non African American: 55 mL/min/{1.73_m2} — ABNORMAL LOW (ref >=60)
Globulin: 2.8 g/dL (calc) (ref 1.9–3.7)
Glucose, Bld: 114 mg/dL — ABNORMAL HIGH (ref 65–99)
Potassium: 3.6 mmol/L (ref 3.5–5.3)
Sodium: 141 mmol/L (ref 135–146)
Total Bilirubin: 1.4 mg/dL — ABNORMAL HIGH (ref 0.2–1.2)
Total Protein: 7.6 g/dL (ref 6.1–8.1)

## 2019-10-20 LAB — PAIN MGMT, PROFILE 1 W/O CONF, U
Amphetamines: NEGATIVE ng/mL
Barbiturates: NEGATIVE ng/mL
Benzodiazepines: NEGATIVE ng/mL
Cocaine Metabolite: NEGATIVE ng/mL
Creatinine: 64.8 mg/dL
Marijuana Metabolite: NEGATIVE ng/mL
Methadone Metabolite: NEGATIVE ng/mL
Opiates: POSITIVE ng/mL
Oxidant: NEGATIVE ug/mL
Oxycodone: NEGATIVE ng/mL
Phencyclidine: NEGATIVE ng/mL
pH: 6 (ref 4.5–9.0)

## 2019-10-20 LAB — CBC WITH DIFFERENTIAL/PLATELET
Absolute Monocytes: 440 cells/uL (ref 200–950)
Basophils Absolute: 16 cells/uL (ref 0–200)
Basophils Relative: 0.2 %
Eosinophils Absolute: 32 cells/uL (ref 15–500)
Eosinophils Relative: 0.4 %
HCT: 39.8 % (ref 38.5–50.0)
Hemoglobin: 13.4 g/dL (ref 13.2–17.1)
Lymphs Abs: 2312 cells/uL (ref 850–3900)
MCH: 31.1 pg (ref 27.0–33.0)
MCHC: 33.7 g/dL (ref 32.0–36.0)
MCV: 92.3 fL (ref 80.0–100.0)
MPV: 11.6 fL (ref 7.5–12.5)
Monocytes Relative: 5.5 %
Neutro Abs: 5200 cells/uL (ref 1500–7800)
Neutrophils Relative %: 65 %
Platelets: 174 10*3/uL (ref 140–400)
RBC: 4.31 10*6/uL (ref 4.20–5.80)
RDW: 13.1 % (ref 11.0–15.0)
Total Lymphocyte: 28.9 %
WBC: 8 10*3/uL (ref 3.8–10.8)

## 2019-10-20 LAB — HEMOGLOBIN A1C
Hgb A1c MFr Bld: 6.4 % of total Hgb — ABNORMAL HIGH (ref <5.7)
Mean Plasma Glucose: 137 (calc)
eAG (mmol/L): 7.6 (calc)

## 2019-10-20 LAB — MICROALBUMIN / CREATININE URINE RATIO
Creatinine, Urine: 59 mg/dL (ref 20–320)
Microalb Creat Ratio: 12 mcg/mg creat (ref <30)
Microalb, Ur: 0.7 mg/dL

## 2019-10-21 MED ORDER — MORPHINE SULFATE 15 MG PO TABS: ORAL_TABLET | ORAL | 0 refills | Status: DC

## 2019-10-21 MED FILL — MORPHINE SULFATE 15 MG TABS: 15 | 30 days supply | Qty: 120 | Fill #0

## 2019-10-21 NOTE — Telephone Encounter (Signed)
Pended Rx and sent to Dr. Mariea Clonts for approval.

## 2019-10-22 ENCOUNTER — Other Ambulatory Visit: Payer: Self-pay | Admitting: Internal Medicine

## 2019-10-22 ENCOUNTER — Telehealth: Payer: Self-pay

## 2019-10-22 MED ORDER — APIXABAN 5 MG PO TABS: ORAL_TABLET | ORAL | 5 refills | Status: DC

## 2019-10-22 MED ORDER — FUROSEMIDE 40 MG PO TABS: 40.0000 mg | ORAL_TABLET | Freq: Two times a day (BID) | ORAL | 5 refills | Status: DC

## 2019-10-22 MED FILL — FUROSEMIDE 40 MG TAB: 40 | 30 days supply | Qty: 60 | Fill #0

## 2019-10-22 MED FILL — ELIQUIS 5 MG TABLET: 5 | 30 days supply | Qty: 60 | Fill #0

## 2019-10-22 NOTE — Telephone Encounter (Signed)
Message left on clinical intake voicemail:   Patients wife left message stating patient needs a refill on   1.) Eliquis 2.) Fluid Pill 3.) Morphine Sulfate    I called Mrs.Cubit back and informed her Morphine was approved yesterday and other 2 medications sent today as requested. Mrs.Ritts appeared grateful.

## 2019-10-27 DIAGNOSIS — M2550 Pain in unspecified joint: Secondary | ICD-10-CM | POA: Diagnosis not present

## 2019-10-27 DIAGNOSIS — R768 Other specified abnormal immunological findings in serum: Secondary | ICD-10-CM | POA: Diagnosis not present

## 2019-10-27 DIAGNOSIS — M150 Primary generalized (osteo)arthritis: Secondary | ICD-10-CM | POA: Diagnosis not present

## 2019-10-27 DIAGNOSIS — E669 Obesity, unspecified: Secondary | ICD-10-CM | POA: Diagnosis not present

## 2019-10-27 DIAGNOSIS — Z6832 Body mass index (BMI) 32.0-32.9, adult: Secondary | ICD-10-CM | POA: Diagnosis not present

## 2019-10-27 MED FILL — HYDROXYCHLOROQUINE 200 MG T: 200 | 30 days supply | Qty: 60 | Fill #0

## 2019-10-30 ENCOUNTER — Encounter: Payer: Self-pay | Admitting: Internal Medicine

## 2019-11-02 ENCOUNTER — Encounter: Payer: Self-pay | Admitting: *Deleted

## 2019-11-02 ENCOUNTER — Other Ambulatory Visit: Payer: Self-pay | Admitting: *Deleted

## 2019-11-02 DIAGNOSIS — E119 Type 2 diabetes mellitus without complications: Secondary | ICD-10-CM

## 2019-11-02 NOTE — Patient Outreach (Addendum)
[facility]) Care Management Chronic Special Needs Program  11/02/2019  Name: Ronnie Jackson DOB: 1936/05/16  MRN: [id number]  [name] is enrolled in a chronic special needs plan for Diabetes. Chronic Care Management Coordinator telephoned client to review health risk assessment and to develop individualized care plan.  Introduced the chronic care management program, importance of client participation, and taking their care plan to all provider appointments and inpatient facilities.  Reviewed the transition of care process and possible referral to community care management.  Subjective: RN care manager spoke with client and he gave permission to speak with spouse due to bil hearing loss.  Client's spouse Ronnie Jackson reports pt recently go one hearing aide and needs another one but cannot afford. Mrs. Boulden states client eats whatever he wants to and has pre-diabetes and does not have diagnosis of diabetes, states they are interested in completing advanced directives and would now like information mailed to their home. Spouse reports client does have "some depression related to his chronic pain "  Client has tried antidepressants in the past and did not like how they made him feel, spouse reports client not willing to see a therapist and his hearing would hinder him also.  Spouse states they would be interested in social work referral for any resources for hearing aides, depression (PHQ9=9). Client recently saw rheumatologist for his chronic pain/ arthritis and on new medication plaquenil just recently started, spouse states " doctor said it would take few weeks to see a difference"  Client is overall independent with ADL's, continues to drive, wife assists client as needed.    Goals Addressed            This Visit's Progress   .  Acknowledge receipt of Building surveyor mailed client Advanced Directives packet. RN care manager mailed EMMI  education article " Advanced directives" Please call RN care manager if you have any questions     . "to be more active and not hurt so bad" (pt-stated)       Please keep stress under control, try to practice relaxation Try to get outside in sunshine daily Take pain medications as prescribed Please talk to your doctor about exercise program     . Client understands the importance of follow-up with providers by attending scheduled visits   On track    Client saw primary care provider for 2 visits in 2020 and 10/19/19. Recently saw rheumatologist. Continue to follow up with primary care provider     . Client will report no worsening of symptoms of Atrial Fibrillation within the next 3 months        Report any symptoms such as dizziness, fast heart rate or fluttering to your doctor Please continue to follow up with primary care provider Take all medications as prescribed Maintain a healthy weight    . Client will work with Education officer, museum for management of depression       RN Transport planner placed order for [facility] social worker for assistance / resources related to depression and cost of hearing aide. RN care manager reviewed options such as medication, therapy and reminded client to always talk with your doctor about any symptoms of depression. RN care manager mailed to client EMMI education article "Coping with a health condition: signs of depression"    . HEMOGLOBIN A1C < 7.0       Per medical record review- Hgb AIC 6.4 on  10/19/19 Visit your doctor every 3-6 months as directed Hgb AIC level checked every 3-6 months Eye exam yearly Check feet daily Carbohydrate controlled meal planning Take medications as prescribed Physical activity     . Obtain annual  Lipid Profile, LDL-C   On track    Per medical record review- Lipid panel completed 10/19/19. The goal for LDL is less than 70 mg/dl as you are at high risk for complications Try to avoid saturated fats, trans-fats and eat more fiber  Follow up with primary care provider and complete lab work as ordered    . Obtain Annual Eye (retinal)  Exam    On track    Per medical record review- eye exam completed 09/01/19 Plan to have dilated eye exam every year    . Obtain Annual Foot Exam   On track    Please have doctor check your feet at least yearly Plan to keep scheduled appointments with providers Client reports primary care provider checks feet    . Obtain annual screen for micro albuminuria (urine) , nephropathy (kidney problems)   On track    MicroAlbumin Urine checked 10/19/19 per medical record review Please have microalbuminuria checked at least yearly Please keep all scheduled appointments with providers    . Obtain Hemoglobin A1C at least 2 times per year   On track    Hgb AIC checked 2 times in 2020 per medical record review and recently 10/19/19 Continue following up with your primary care provider and have lab work as ordered    . Visit Primary Care Provider or Endocrinologist at least 2 times per year    On track    Client saw primary care provider for 2 visits in 2020 and once in 2021 Continue to follow up with primary care provider Call and make any needed appointments Take medications as prescribed       Plan:  RN care manager reviewed medications with spouse, placed emphasis on carbohydrate modified diet, reviewed resources for depression and gave HTA conciegre contact number, reminded spouse of using 24 hour nurse line number.  RN care manager mailed successful outreach letter to client's home including consent form, advanced directive packet, EMMI education articles and 24 hour nurse line magnet and updated individualized care plan.  RN care manager faxed today's note and individualized care plan to primary care provider.  Chronic care management coordination will outreach in:  9-12 months  Will refer client to:  [place] worker.   Kassie Mends Nursing/RN Coord [facility] Case Manager, C-SNP   [phone]

## 2019-11-05 ENCOUNTER — Encounter: Payer: Self-pay | Admitting: *Deleted

## 2019-11-05 ENCOUNTER — Other Ambulatory Visit: Payer: Self-pay | Admitting: *Deleted

## 2019-11-05 NOTE — Patient Outreach (Signed)
Ronnie Jackson) Care Management  11/05/2019  MAXXIM LASHOMB September 21, 1935 [id number]   CSW was able to make initial contact with patient's wife, Corell Orloff today to perform the phone assessment on patient, as well as assess and assist with social work needs and services.  CSW introduced self, explained role and types of services provided through [facility] ([facility]).  CSW further explained to Mrs. Sartini that [place] works with patient's Chronic Product manager, also with [facility], Jacqlyn Larsen.  CSW then explained the reason for the call, indicating that Mrs. Mare Ferrari thought that patient would benefit from social work services and resources to assist with counseling and supportive services for symptoms of depression, as well as community resources that may be able to assist with the purchase of hearing aids.  CSW obtained two HIPAA compliant identifiers from Mrs. Owens Shark, which included patient's name and date of birth.  Mrs. Kruczek admitted that she believes that patient is experiencing symptoms of depression, specifically related to chronic pain, but that patient adamantly denies, not wanting to discuss his feelings with anyone or receive any type of counseling services.  Mrs. Thoresen encouraged CSW to mail her some information about depression, agreeing to review the information with patient, knowing that patient will not take the initiative to review the information himself.  CSW also agreed to mail patient a list of therapists and psychiatrists in [facility] that specialize in depression related to chronic pain, in the event that patient changes his mind about wanting to receive psychiatric services.  CSW spoke with Mrs. Owens Shark about CSW talking with patient's Primary Care Physician, Dr. Hollace Kinnier to see about prescribing patient an antidepressant medication.  Mrs. Gelder stated, "He won't take it, we've already tried that  route, it didn't work out".  In total, CSW will mailing patient and Mrs. Lindig all of the following resource information:  [place]; [facility] the [place]; List of Therapists & Psychiatrists in [place], [place]; Medicaid Approved Therapists in [place], [place].  In addition, CSW will mail patient and Mrs. Owens Shark the following list of EMMI information for their independent review and knowledge:  Coping with a Health Condition:  Signs of Depression; Depression; Depression:  Medication; Depression:  Other Things You Can Do; Talk Therapy for Depression.  CSW then agreed to follow-up with Mrs. Taitano again next week, on Thursday, November 12, 2019, around 10:00am, to ensure that she received the resource information, as well as answer any questions that she may have regarding information received.  CSW provided Mrs. Ruter with a few community agencies that may be able to assist with the purchase of a new hearing aid for patient.  Nat Christen, BSW, MSW, LCSW  Licensed Education officer, environmental Health System  Mailing [place] [address], [place], [place] [address] Physical Address-[address], [place], [place] [address] Toll Free Main # [phone] Fax # [phone] Cell # [phone]  Office # [phone] [email or web address]

## 2019-11-06 ENCOUNTER — Ambulatory Visit: Payer: Self-pay | Admitting: *Deleted

## 2019-11-12 ENCOUNTER — Other Ambulatory Visit: Payer: Self-pay | Admitting: *Deleted

## 2019-11-12 ENCOUNTER — Encounter: Payer: Self-pay | Admitting: *Deleted

## 2019-11-12 NOTE — Patient Outreach (Signed)
[facility]) Care Management  11/12/2019  Ronnie Jackson 05-10-1936 [id number]  CSW was able to make contact with patient's wife, Ronnie Jackson today to follow-up regarding social work services and resources for patient, as well as to confirm that Mrs. Howerter received the packet of resource information mailed to her home by CSW last week.  Mrs. Schnapp confirmed receipt, admitting that she has already reviewed a lot of the EMMI information, pertaining specifically to depression, with patient and that patient now admits to suffering from symptoms of depression.  However, Mrs. Schriver reported that patient continues to adamantly deny the need for counseling and supportive services, nor is patient interested in attending a support program or taking an antidepressant medication.  CSW reminded Mrs. Owens Shark, that also included in the packet of information mailed to her home, are a list of mental health resources, a brochure for [employer] of the [place], a list of psychiatrists in [place] and a list of [place] approved therapists in [place].  CSW explained to Mrs. Larouche that CSW wanted for Mrs. Byard to have this list of resources, in the event that patient changes his mind about wanting to seek psychotherapeutic services in the future.  Mrs. Erbes agreed to continue to discuss the information and list of resources with patient, but was not at all optimistic about patient changing his mind, admitting that patient is a very private person and likes to handle his own affairs.  CSW will perform a case closure on patient, as all goals of treatment have been met from social work standpoint and no additional social work needs have been identified at this time.  CSW will notify patient's Chronic Special Needs Program Coordinator, also with [facility], Jacqlyn Larsen of CSW's plans to close patient's case.  CSW will fax an update to patient's Primary Care  Physician, Dr. Hollace Kinnier to ensure that they are aware of CSW's involvement with patient's plan of care.  CSW was able to confirm that Mrs. Bergeson has the correct contact information for CSW, encouraging her to contact CSW directly if additional social work needs arise in the near future.  Mrs. Calabretta voiced understanding and was agreeable to this plan, most appreciative of the call and of all resources provided.    Nat Christen, BSW, MSW, LCSW  Licensed Education officer, environmental Health System  Mailing [place] [address] [place], [place] [address] Physical Address-[address], [place], [place] [address] Toll Free Main # [phone] Fax # [phone] Cell # [phone]  Office # [phone] [email or web address]

## 2019-11-20 ENCOUNTER — Other Ambulatory Visit: Payer: Self-pay | Admitting: *Deleted

## 2019-11-20 DIAGNOSIS — G894 Chronic pain syndrome: Secondary | ICD-10-CM

## 2019-11-20 DIAGNOSIS — M159 Polyosteoarthritis, unspecified: Secondary | ICD-10-CM

## 2019-11-20 MED ORDER — MORPHINE SULFATE 15 MG PO TABS: ORAL_TABLET | ORAL | 0 refills | Status: DC

## 2019-11-20 MED FILL — ELIQUIS 5 MG TABLET: 5 | 30 days supply | Qty: 60 | Fill #1

## 2019-11-20 MED FILL — MORPHINE SULFATE 15 MG TABS: 15 | 30 days supply | Qty: 120 | Fill #0

## 2019-11-20 MED FILL — FUROSEMIDE 40 MG TAB: 40 | 30 days supply | Qty: 60 | Fill #1

## 2019-11-20 NOTE — Telephone Encounter (Signed)
Ronnie Jackson, Wife requested refill [facility] Verified LR: 10/21/2019 Pended Rx and sent to [name] for approval due to Dr. Mariea Clonts out of office.

## 2019-11-20 NOTE — Telephone Encounter (Signed)
Patient wife Cortney Mccorquodale left voicemail calling for refill on medication. Voicemail was left with office call back number.

## 2019-11-27 ENCOUNTER — Other Ambulatory Visit: Payer: Self-pay

## 2019-11-27 ENCOUNTER — Encounter: Payer: Self-pay | Admitting: Family

## 2019-11-27 ENCOUNTER — Ambulatory Visit (INDEPENDENT_AMBULATORY_CARE_PROVIDER_SITE_OTHER): Payer: HMO | Admitting: Family

## 2019-11-27 DIAGNOSIS — Z0000 Encounter for general adult medical examination without abnormal findings: Secondary | ICD-10-CM

## 2019-11-27 DIAGNOSIS — E1142 Type 2 diabetes mellitus with diabetic polyneuropathy: Secondary | ICD-10-CM | POA: Diagnosis not present

## 2019-11-27 NOTE — Progress Notes (Signed)
    This service is provided via telemedicine  No vital signs collected/recorded due to the encounter was a telemedicine visit.   Location of patient (ex: home, work): Home.  Patient consents to a telephone visit: Yes.  Location of the provider (ex: office, home):  [facility].  Name of any referring provider: N/A  Names of all persons participating in the telemedicine service and their role in the encounter:  Patient, Wife Finnie Fabbri, Heriberto Antigua, RMA, Marlowe Sax, NP.    Time spent on call: 8 minutes spent on the phone with Medical Assistant.

## 2019-11-27 NOTE — Patient Instructions (Signed)
Ronnie Jackson , Thank you for taking time to come for your Medicare Wellness Visit. I appreciate your ongoing commitment to your health goals. Please review the following plan we discussed and let me know if I can assist you in the future.   Screening recommendations/referrals: Colonoscopy: N/A  Recommended yearly ophthalmology/optometry visit for glaucoma screening and checkup Recommended yearly dental visit for hygiene and checkup  Vaccinations: Influenza vaccine: Up to date  Pneumococcal vaccine : Up to date  Tdap vaccine : Up to date  Shingles vaccine: Declined    Advanced directives: No   Conditions/risks identified: Advance age male > 55,Male gender  Next appointment: 1 year   Preventive Care 82 Years and Older, Male Preventive care refers to lifestyle choices and visits with your health care provider that can promote health and wellness. What does preventive care include?  A yearly physical exam. This is also called an annual well check.  Dental exams once or twice a year.  Routine eye exams. Ask your health care provider how often you should have your eyes checked.  Personal lifestyle choices, including:  Daily care of your teeth and gums.  Regular physical activity.  Eating a healthy diet.  Avoiding tobacco and drug use.  Limiting alcohol use.  Practicing safe sex.  Taking low doses of aspirin every day.  Taking vitamin and mineral supplements as recommended by your health care provider. What happens during an annual well check? The services and screenings done by your health care provider during your annual well check will depend on your age, overall health, lifestyle risk factors, and family history of disease. Counseling  Your health care provider may ask you questions about your:  Alcohol use.  Tobacco use.  Drug use.  Emotional well-being.  Home and relationship well-being.  Sexual activity.  Eating habits.  History of falls.  Memory and  ability to understand (cognition).  Work and work Statistician. Screening  You may have the following tests or measurements:  Height, weight, and BMI.  Blood pressure.  Lipid and cholesterol levels. These may be checked every 5 years, or more frequently if you are over 20 years old.  Skin check.  Lung cancer screening. You may have this screening every year starting at age 67 if you have a 30-pack-year history of smoking and currently smoke or have quit within the past 15 years.  Fecal occult blood test (FOBT) of the stool. You may have this test every year starting at age 39.  Flexible sigmoidoscopy or colonoscopy. You may have a sigmoidoscopy every 5 years or a colonoscopy every 10 years starting at age 27.  Prostate cancer screening. Recommendations will vary depending on your family history and other risks.  Hepatitis C blood test.  Hepatitis B blood test.  Sexually transmitted disease (STD) testing.  Diabetes screening. This is done by checking your blood sugar (glucose) after you have not eaten for a while (fasting). You may have this done every 1-3 years.  Abdominal aortic aneurysm (AAA) screening. You may need this if you are a current or former smoker.  Osteoporosis. You may be screened starting at age 24 if you are at high risk. Talk with your health care provider about your test results, treatment options, and if necessary, the need for more tests. Vaccines  Your health care provider may recommend certain vaccines, such as:  Influenza vaccine. This is recommended every year.  Tetanus, diphtheria, and acellular pertussis (Tdap, Td) vaccine. You may need a Td booster every  10 years.  Zoster vaccine. You may need this after age 61.  Pneumococcal 13-valent conjugate (PCV13) vaccine. One dose is recommended after age 56.  Pneumococcal polysaccharide (PPSV23) vaccine. One dose is recommended after age 36. Talk to your health care provider about which screenings and  vaccines you need and how often you need them. This information is not intended to replace advice given to you by your health care provider. Make sure you discuss any questions you have with your health care provider. Document Released: 08/05/2015 Document Revised: 03/28/2016 Document Reviewed: 05/10/2015 Elsevier Interactive Patient Education  2017 [place] Prevention in the Home Falls can cause injuries. They can happen to people of all ages. There are many things you can do to make your home safe and to help prevent falls. What can I do on the outside of my home?  Regularly fix the edges of walkways and driveways and fix any cracks.  Remove anything that might make you trip as you walk through a door, such as a raised step or threshold.  Trim any bushes or trees on the path to your home.  Use bright outdoor lighting.  Clear any walking paths of anything that might make someone trip, such as rocks or tools.  Regularly check to see if handrails are loose or broken. Make sure that both sides of any steps have handrails.  Any raised decks and porches should have guardrails on the edges.  Have any leaves, snow, or ice cleared regularly.  Use sand or salt on walking paths during winter.  Clean up any spills in your garage right away. This includes oil or grease spills. What can I do in the bathroom?  Use night lights.  Install grab bars by the toilet and in the tub and shower. Do not use towel bars as grab bars.  Use non-skid mats or decals in the tub or shower.  If you need to sit down in the shower, use a plastic, non-slip stool.  Keep the floor dry. Clean up any water that spills on the floor as soon as it happens.  Remove soap buildup in the tub or shower regularly.  Attach bath mats securely with double-sided non-slip rug tape.  Do not have throw rugs and other things on the floor that can make you trip. What can I do in the bedroom?  Use night  lights.  Make sure that you have a light by your bed that is easy to reach.  Do not use any sheets or blankets that are too big for your bed. They should not hang down onto the floor.  Have a firm chair that has side arms. You can use this for support while you get dressed.  Do not have throw rugs and other things on the floor that can make you trip. What can I do in the kitchen?  Clean up any spills right away.  Avoid walking on wet floors.  Keep items that you use a lot in easy-to-reach places.  If you need to reach something above you, use a strong step stool that has a grab bar.  Keep electrical cords out of the way.  Do not use floor polish or wax that makes floors slippery. If you must use wax, use non-skid floor wax.  Do not have throw rugs and other things on the floor that can make you trip. What can I do with my stairs?  Do not leave any items on the stairs.  Make sure  that there are handrails on both sides of the stairs and use them. Fix handrails that are broken or loose. Make sure that handrails are as long as the stairways.  Check any carpeting to make sure that it is firmly attached to the stairs. Fix any carpet that is loose or worn.  Avoid having throw rugs at the top or bottom of the stairs. If you do have throw rugs, attach them to the floor with carpet tape.  Make sure that you have a light switch at the top of the stairs and the bottom of the stairs. If you do not have them, ask someone to add them for you. What else can I do to help prevent falls?  Wear shoes that:  Do not have high heels.  Have rubber bottoms.  Are comfortable and fit you well.  Are closed at the toe. Do not wear sandals.  If you use a stepladder:  Make sure that it is fully opened. Do not climb a closed stepladder.  Make sure that both sides of the stepladder are locked into place.  Ask someone to hold it for you, if possible.  Clearly mark and make sure that you can  see:  Any grab bars or handrails.  First and last steps.  Where the edge of each step is.  Use tools that help you move around (mobility aids) if they are needed. These include:  Canes.  Walkers.  Scooters.  Crutches.  Turn on the lights when you go into a dark area. Replace any light bulbs as soon as they burn out.  Set up your furniture so you have a clear path. Avoid moving your furniture around.  If any of your floors are uneven, fix them.  If there are any pets around you, be aware of where they are.  Review your medicines with your doctor. Some medicines can make you feel dizzy. This can increase your chance of falling. Ask your doctor what other things that you can do to help prevent falls. This information is not intended to replace advice given to you by your health care provider. Make sure you discuss any questions you have with your health care provider. Document Released: 05/05/2009 Document Revised: 12/15/2015 Document Reviewed: 08/13/2014 Elsevier Interactive Patient Education  2017 [employer].

## 2019-11-27 NOTE — Progress Notes (Signed)
Subjective:   Ronnie Jackson is a 84 y.o. male who presents for Medicare Annual/Subsequent preventive examination.  Review of Systems:  Cardiac Risk Factors include: advanced age (>67men, >66 women);male gender     Objective:    Vitals: There were no vitals taken for this visit.  There is no height or weight on file to calculate BMI.  Advanced Directives 11/27/2019 11/05/2019 11/02/2019 10/19/2019 11/25/2018 11/25/2018 02/20/2018  Does Patient Have a Medical Advance Directive? No No No No No No No  Would patient like information on creating a medical advance directive? No - Patient declined No - Patient declined Yes (MAU/Ambulatory/Procedural Areas - Information given) No - Patient declined - - No - Patient declined    Tobacco Social History   Tobacco Use  Smoking Status Never Smoker  Smokeless Tobacco Current User  . Types: Chew  Tobacco Comment   Chews tobacco daily      Ready to quit: Not Answered Counseling given: Not Answered Comment: Chews tobacco daily    Clinical Intake:  Pre-visit preparation completed: No  Pain : 0-10 Pain Score: 5  Pain Type: Chronic pain Pain Location: Generalized Pain Orientation: Right, Left Pain Radiating Towards: no Pain Descriptors / Indicators: Aching Pain Onset: Other (comment)(several years) Pain Frequency: Intermittent Pain Relieving Factors: Plaquenil Effect of Pain on Daily Activities: yes  Pain Relieving Factors: Plaquenil  BMI - recorded: 29.99 Nutritional Status: BMI 25 -29 Overweight Nutritional Risks: None Diabetes: Yes CBG done?: No Did pt. bring in CBG monitor from home?: No  How often do you need to have someone help you when you read instructions, pamphlets, or other written materials from your doctor or pharmacy?: 1 - Never What is the last grade level you completed in school?: 12 Grade  Interpreter Needed?: No  Information entered by :: Kenlynn Houde FNP-C  Past Medical History:  Diagnosis Date  . Anxiety  state, unspecified   . Atrial fibrillation ([place]) 03/06/2016  . Chronic pain syndrome   . Constipation   . Depressive disorder, not elsewhere classified   . Dysphagia, unspecified(787.20)   . Eye exam normal 09/01/2019   Diabetic eye exam did not reveal any retinopathy.   . Herpes simplex disease   . Hyperlipidemia   . Hypertension   . Hypopotassemia   . Insomnia, unspecified   . Intestinovesical fistula   . Malignant neoplasm of prostate ([place])   . Myalgia and myositis, unspecified   . Osteoarthritis   . Pain in joint, lower leg    Bilateral knee pains  . Reflux esophagitis   . Rheumatoid arthritis with rheumatoid factor (HCC)   . Spinal stenosis, unspecified region other than cervical   . Thrombocytopenia, unspecified ([place])   . Type II or unspecified type diabetes mellitus without mention of complication, not stated as uncontrolled    Pt states that it is prediabetic  . Unspecified arthropathy, pelvic region and thigh   . Unspecified hereditary and idiopathic peripheral neuropathy    Past Surgical History:  Procedure Laterality Date  . CARDIAC CATHETERIZATION  01/31/2004   Dr Gwenlyn Found  . CARDIOVERSION N/A 07/12/2016   Procedure: CARDIOVERSION;  Surgeon: Pixie Casino, MD;  Location: [facility];  Service: Cardiovascular;  Laterality: N/A;  . COLONOSCOPY  08/09/2006   Dr Christian Mate, hemorrhoids/rectal fistula  . cystocopy  07/2006   Dr Harlow Asa  . NASAL SINUS SURGERY  1992  . PROSTATE SURGERY  1991  . TOTAL HIP ARTHROPLASTY Right 02/20/2018   Procedure: RIGHT TOTAL HIP ARTHROPLASTY  ANTERIOR APPROACH;  Surgeon: Mcarthur Rossetti, MD;  Location: [facility];  Service: Orthopedics;  Laterality: Right;   Family History  Problem Relation Age of Onset  . Prostate cancer Father   . Heart failure Mother   . Lung cancer Daughter   . Arthritis Sister    Social History   Socioeconomic History  . Marital status: Married    Spouse name: Jamiroquai Cavey  . Number of children: 1  .  Years of education: 50  . Highest education level: 12th grade  Occupational History  . Occupation: Retired  Tobacco Use  . Smoking status: Never Smoker  . Smokeless tobacco: Current User    Types: Chew  . Tobacco comment: Chews tobacco daily   Substance and Sexual Activity  . Alcohol use: No  . Drug use: No  . Sexual activity: Not Currently  Other Topics Concern  . Not on file  Social History Narrative  . Not on file   Social Determinants of Health   Financial Resource Strain: Low Risk   . Difficulty of Paying Living Expenses: Not hard at all  Food Insecurity: No Food Insecurity  . Worried About Charity fundraiser in the Last Year: Never true  . Ran Out of Food in the Last Year: Never true  Transportation Needs: No Transportation Needs  . Lack of Transportation (Medical): No  . Lack of Transportation (Non-Medical): No  Physical Activity: Inactive  . Days of Exercise per Week: 0 days  . Minutes of Exercise per Session: 0 min  Stress: Stress Concern Present  . Feeling of Stress : To some extent  Social Connections: Slightly Isolated  . Frequency of Communication with Friends and Family: More than three times a week  . Frequency of Social Gatherings with Friends and Family: More than three times a week  . Attends Religious Services: More than 4 times per year  . Active Member of Clubs or Organizations: No  . Attends Archivist Meetings: Never  . Marital Status: Married    Outpatient Encounter Medications as of 11/27/2019  Medication Sig  . apixaban (ELIQUIS) 5 MG TABS tablet Take one tablet by mouth twice daily to help prevent stroke related to atrial fibrillation  . cromolyn (OPTICROM) 4 % ophthalmic solution Place 1 drop into both eyes 4 (four) times daily.  . furosemide (LASIX) 40 MG tablet Take 1 tablet (40 mg total) by mouth 2 (two) times daily.  . hydroxychloroquine (PLAQUENIL) 200 MG tablet Take by mouth 2 (two) times daily.  Marland Kitchen morphine (MSIR) 15 MG tablet  Take one tablet by mouth every 6 hours as needed for severe pain  . sulfamethoxazole-trimethoprim (BACTRIM DS) 800-160 MG tablet Take 1 tablet by mouth 2 (two) times daily.   No facility-administered encounter medications on file as of 11/27/2019.    Activities of Daily Living In your present state of health, do you have any difficulty performing the following activities: 11/27/2019 11/05/2019  Hearing? Y Y  Comment wear hearing aids Patient has a hearing aid.  Vision? N N  Difficulty concentrating or making decisions? Y Y  Comment Rembering Forgetfulness and memory loss.  Walking or climbing stairs? N N  Dressing or bathing? N N  Doing errands, shopping? N N  Preparing Food and eating ? N N  Using the Toilet? Y N  Comment constipation -  In the past six months, have you accidently leaked urine? N Y  Comment - Occasional incontinence.  Do you have problems with  loss of bowel control? N N  Managing your Medications? N Y  Comment - Wife manages.  Managing your Finances? N Y  Comment - Wife manages.  Housekeeping or managing your Housekeeping? N Y  Comment - Wife manages.  Some recent data might be hidden    Patient Care Team: Gayland Curry, DO as PCP - General (Geriatric Medicine) Constance Haw, MD as PCP - Cardiology (Cardiology) Tomasita Morrow, NP as Nurse Practitioner (Cardiology) Deneise Lever, MD as Consulting Physician (Pulmonary Disease) Kristeen Miss, MD as Consulting Physician (Neurosurgery) Ronny Bacon, MD as Referring Physician (Surgery) [name], Fransico Him., MD (General Surgery) Bo Merino, MD as Consulting Physician (Rheumatology) Clent Jacks, MD as Consulting Physician (Ophthalmology) Kassie Mends, RN as [facility]   Assessment:   This is a routine wellness examination for [place].  Exercise Activities and Dietary recommendations Current Exercise Habits: The patient does not participate in regular  exercise at present, Exercise limited by: None identified  Goals    .  Acknowledge receipt of Art gallery manager mailed client Advanced Directives packet. RN care manager mailed EMMI education article " Advanced directives" Please call RN care manager if you have any questions     . "to be more active and not hurt so bad" (pt-stated)     Please keep stress under control, try to practice relaxation Try to get outside in sunshine daily Take pain medications as prescribed Please talk to your doctor about exercise program     . Client understands the importance of follow-up with providers by attending scheduled visits     Client saw primary care provider for 2 visits in 2020 and 10/19/19. Recently saw rheumatologist. Continue to follow up with primary care provider     . Client will report no worsening of symptoms of Atrial Fibrillation within the next 3 months      Report any symptoms such as dizziness, fast heart rate or fluttering to your doctor Please continue to follow up with primary care provider Take all medications as prescribed Maintain a healthy weight    . Client will work with Education officer, museum for management of depression     RN Transport planner placed order for [facility] social worker for assistance / resources related to depression and cost of hearing aide. RN care manager reviewed options such as medication, therapy and reminded client to always talk with your doctor about any symptoms of depression. RN care manager mailed to client EMMI education article "Coping with a health condition: signs of depression"    . HEMOGLOBIN A1C < 7.0     Per medical record review- Hgb AIC 6.4 on 10/19/19 Visit your doctor every 3-6 months as directed Hgb AIC level checked every 3-6 months Eye exam yearly Check feet daily Carbohydrate controlled meal planning Take medications as prescribed Physical activity     . Obtain annual  Lipid Profile, LDL-C     Per medical  record review- Lipid panel completed 10/19/19. The goal for LDL is less than 70 mg/dl as you are at high risk for complications Try to avoid saturated fats, trans-fats and eat more fiber Follow up with primary care provider and complete lab work as ordered    . Obtain Annual Eye (retinal)  Exam      Per medical record review- eye exam completed 09/01/19 Plan to have dilated eye exam every year    . Obtain Annual Foot Exam  Please have doctor check your feet at least yearly Plan to keep scheduled appointments with providers Client reports primary care provider checks feet    . Obtain annual screen for micro albuminuria (urine) , nephropathy (kidney problems)     MicroAlbumin Urine checked 10/19/19 per medical record review Please have microalbuminuria checked at least yearly Please keep all scheduled appointments with providers    . Obtain Hemoglobin A1C at least 2 times per year     Hgb AIC checked 2 times in 2020 per medical record review and recently 10/19/19 Continue following up with your primary care provider and have lab work as ordered    . pat     I want to get to feeling better     . Visit Primary Care Provider or Endocrinologist at least 2 times per year      Client saw primary care provider for 2 visits in 2020 and once in 2021 Continue to follow up with primary care provider Call and make any needed appointments Take medications as prescribed       Fall Risk Fall Risk  11/27/2019 11/05/2019 10/19/2019 12/08/2018 11/25/2018  Falls in the past year? 0 1 - 0 0  Number falls in past yr: 0 0 0 0 0  Injury with Fall? 0 0 0 0 0  Risk for fall due to : - History of fall(s);Impaired balance/gait;Impaired mobility - - -  Follow up - Education provided;Falls prevention discussed - - -   Is the patient's home free of loose throw rugs in walkways, pet beds, electrical cords, etc?   no      Grab bars in the bathroom? yes      Handrails on the stairs?   no      Adequate lighting?    yes   Depression Screen PHQ 2/9 Scores 11/27/2019 11/05/2019 11/02/2019 10/19/2019  PHQ - 2 Score 0 4 4 0  PHQ- 9 Score - 6 9 -    Cognitive Function MMSE - Mini Mental State Exam 11/20/2017 11/15/2016  Orientation to time 3 4  Orientation to Place 5 4  Registration 3 3  Attention/ Calculation 5 5  Recall 2 2  Language- name 2 objects 2 2  Language- repeat 1 1  Language- follow 3 step command 3 3  Language- read & follow direction 1 1  Write a sentence 1 1  Copy design 1 1  Total score 27 27     6CIT Screen 11/27/2019 11/25/2018  What Year? 0 points 0 points  What month? 0 points 0 points  What time? 0 points 0 points  Count back from 20 0 points 0 points  Months in reverse 0 points 4 points  Repeat phrase 10 points 4 points  Total Score 10 8    Immunization History  Administered Date(s) Administered  . Fluad Quad(high Dose 65+) 10/19/2019  . Influenza, High Dose Seasonal PF 04/18/2017, 05/12/2018  . Influenza,inj,Quad PF,6+ Mos 06/21/2015  . Pneumococcal Conjugate-13 06/21/2015  . Pneumococcal Polysaccharide-23 11/15/2016    Qualifies for Shingles Vaccine? Declined Cost   Screening Tests Health Maintenance  Topic Date Due  . COVID-19 Vaccine (1) Never done  . FOOT EXAM  12/22/2015  . TETANUS/TDAP  11/01/2024 (Originally 08/02/1954)  . INFLUENZA VACCINE  02/21/2020  . HEMOGLOBIN A1C  04/20/2020  . OPHTHALMOLOGY EXAM  08/31/2020  . URINE MICROALBUMIN  10/18/2020  . PNA vac Low Risk Adult  Completed   Cancer Screenings: Lung: Low Dose CT Chest recommended if  Age 81-80 years, 59 pack-year currently smoking OR have quit w/in 15years. Patient does not qualify. Colorectal:Aged   Additional Screenings: Hepatitis C Screening:Low Risk      Plan:   - Declined Shingrix and COVID-19 vaccine  - Referral to Podiatrist for annual foot exam   I have personally reviewed and noted the following in the patient's chart:   . Medical and social history . Use of alcohol, tobacco  or illicit drugs  . Current medications and supplements . Functional ability and status . Nutritional status . Physical activity . Advanced directives . List of other physicians . Hospitalizations, surgeries, and ER visits in previous 12 months . Vitals . Screenings to include cognitive, depression, and falls . Referrals and appointments  In addition, I have reviewed and discussed with patient certain preventive protocols, quality metrics, and best practice recommendations. A written personalized care plan for preventive services as well as general preventive health recommendations were provided to patient.    Sandrea Hughs, NP  11/27/2019

## 2019-11-28 ENCOUNTER — Emergency Department (HOSPITAL_COMMUNITY)
Admission: EM | Admit: 2019-11-28 | Discharge: 2019-11-28 | Disposition: A | Discharge status: Home / Self Care | Payer: HMO | Attending: Emergency Medicine | Admitting: Emergency Medicine

## 2019-11-28 ENCOUNTER — Emergency Department (HOSPITAL_COMMUNITY): Payer: HMO

## 2019-11-28 ENCOUNTER — Encounter (HOSPITAL_COMMUNITY): Payer: Self-pay | Admitting: Emergency Medicine

## 2019-11-28 ENCOUNTER — Other Ambulatory Visit: Payer: Self-pay

## 2019-11-28 DIAGNOSIS — I714 Abdominal aortic aneurysm, without rupture, unspecified: Secondary | ICD-10-CM | POA: Diagnosis present

## 2019-11-28 DIAGNOSIS — K5901 Slow transit constipation: Secondary | ICD-10-CM | POA: Insufficient documentation

## 2019-11-28 DIAGNOSIS — R109 Unspecified abdominal pain: Secondary | ICD-10-CM | POA: Diagnosis not present

## 2019-11-28 DIAGNOSIS — I10 Essential (primary) hypertension: Secondary | ICD-10-CM | POA: Insufficient documentation

## 2019-11-28 DIAGNOSIS — R1030 Lower abdominal pain, unspecified: Secondary | ICD-10-CM | POA: Diagnosis not present

## 2019-11-28 DIAGNOSIS — E1140 Type 2 diabetes mellitus with diabetic neuropathy, unspecified: Secondary | ICD-10-CM | POA: Insufficient documentation

## 2019-11-28 DIAGNOSIS — Z96641 Presence of right artificial hip joint: Secondary | ICD-10-CM | POA: Diagnosis not present

## 2019-11-28 DIAGNOSIS — Z7901 Long term (current) use of anticoagulants: Secondary | ICD-10-CM | POA: Insufficient documentation

## 2019-11-28 DIAGNOSIS — Z8546 Personal history of malignant neoplasm of prostate: Secondary | ICD-10-CM | POA: Diagnosis not present

## 2019-11-28 DIAGNOSIS — Z79899 Other long term (current) drug therapy: Secondary | ICD-10-CM | POA: Diagnosis not present

## 2019-11-28 DIAGNOSIS — K5900 Constipation, unspecified: Secondary | ICD-10-CM | POA: Diagnosis not present

## 2019-11-28 DIAGNOSIS — F17220 Nicotine dependence, chewing tobacco, uncomplicated: Secondary | ICD-10-CM | POA: Insufficient documentation

## 2019-11-28 LAB — COMPREHENSIVE METABOLIC PANEL
ALT: 19 U/L (ref 0–44)
AST: 31 U/L (ref 15–41)
Albumin: 4.5 g/dL (ref 3.5–5.0)
Alkaline Phosphatase: 77 U/L (ref 38–126)
Anion gap: 11 (ref 5–15)
BUN: 18 mg/dL (ref 8–23)
CO2: 26 mmol/L (ref 22–32)
Calcium: 9.1 mg/dL (ref 8.9–10.3)
Chloride: 103 mmol/L (ref 98–111)
Creatinine, Ser: 1.55 mg/dL — ABNORMAL HIGH (ref 0.61–1.24)
GFR calc Af Amer: 48 mL/min — ABNORMAL LOW (ref >60)
GFR calc non Af Amer: 41 mL/min — ABNORMAL LOW (ref >60)
Glucose, Bld: 114 mg/dL — ABNORMAL HIGH (ref 70–99)
Potassium: 3.7 mmol/L (ref 3.5–5.1)
Sodium: 140 mmol/L (ref 135–145)
Total Bilirubin: 1.6 mg/dL — ABNORMAL HIGH (ref 0.3–1.2)
Total Protein: 7.5 g/dL (ref 6.5–8.1)

## 2019-11-28 LAB — CBC
HCT: 39.7 % (ref 39.0–52.0)
Hemoglobin: 12.8 g/dL — ABNORMAL LOW (ref 13.0–17.0)
MCH: 31 pg (ref 26.0–34.0)
MCHC: 32.2 g/dL (ref 30.0–36.0)
MCV: 96.1 fL (ref 80.0–100.0)
Platelets: 167 10*3/uL (ref 150–400)
RBC: 4.13 MIL/uL — ABNORMAL LOW (ref 4.22–5.81)
RDW: 13.3 % (ref 11.5–15.5)
WBC: 9.6 10*3/uL (ref 4.0–10.5)
nRBC: 0 % (ref 0.0–0.2)

## 2019-11-28 LAB — URINALYSIS, ROUTINE W REFLEX MICROSCOPIC
Bilirubin Urine: NEGATIVE
Glucose, UA: NEGATIVE mg/dL
Hgb urine dipstick: NEGATIVE
Ketones, ur: 5 mg/dL — AB
Leukocytes,Ua: NEGATIVE
Nitrite: NEGATIVE
Protein, ur: NEGATIVE mg/dL
Specific Gravity, Urine: 1.01 (ref 1.005–1.030)
pH: 5 (ref 5.0–8.0)

## 2019-11-28 LAB — LIPASE, BLOOD: Lipase: 30 U/L (ref 11–51)

## 2019-11-28 MED ORDER — POLYETHYLENE GLYCOL 3350 17 G PO PACK: 17.0000 g | PACK | Freq: Every day | ORAL | 0 refills | Status: DC

## 2019-11-28 MED ORDER — IOHEXOL 300 MG/ML  SOLN
75.0000 mL | Freq: Once | INTRAMUSCULAR | Status: AC | PRN
  Administered 2019-11-28: 75 mL via INTRAVENOUS

## 2019-11-28 MED ORDER — SODIUM CHLORIDE 0.9 % IV BOLUS
1000.0000 mL | Freq: Once | INTRAVENOUS | Status: AC
  Administered 2019-11-28: 1000 mL via INTRAVENOUS

## 2019-11-28 MED ORDER — SODIUM CHLORIDE 0.9% FLUSH: 3.0000 mL | Freq: Once | INTRAVENOUS | Status: DC

## 2019-11-28 NOTE — ED Triage Notes (Signed)
C/o generalized abd pain x 5 days with constipation.

## 2019-11-28 NOTE — ED Provider Notes (Signed)
[facility] DEPARTMENT Provider Note   CSN: [id number] Arrival date & time: 11/28/19  1337     History Chief Complaint  Patient presents with  . Constipation    Ronnie Jackson is a 84 y.o. male with a past medical history of A. fib, hypertension, hyperlipidemia, status post prostatectomy in 1991 followed by colostomy with subsequent revision presenting to the ED with a chief complaint of constipation.  Reports for the past 5 days he has had lower abdominal cramping as well as decrease in bowel movements.  He last had a normal bowel movement about 5 to 6 days ago.  He tried administering an enema last night as well as taking a laxative which only produced a small bowel movement.  He is continuing to pass gas.  He denies history of similar symptoms in the past.  States that he stays adequately hydrated.  He denies any vomiting, fever, urinary symptoms (although does feel urinary hesitancy), history of bowel obstruction.  HPI     Past Medical History:  Diagnosis Date  . Anxiety state, unspecified   . Atrial fibrillation ([place]) 03/06/2016  . Chronic pain syndrome   . Constipation   . Depressive disorder, not elsewhere classified   . Dysphagia, unspecified(787.20)   . Eye exam normal 09/01/2019   Diabetic eye exam did not reveal any retinopathy.   . Herpes simplex disease   . Hyperlipidemia   . Hypertension   . Hypopotassemia   . Insomnia, unspecified   . Intestinovesical fistula   . Malignant neoplasm of prostate ([place])   . Myalgia and myositis, unspecified   . Osteoarthritis   . Pain in joint, lower leg    Bilateral knee pains  . Reflux esophagitis   . Rheumatoid arthritis with rheumatoid factor (HCC)   . Spinal stenosis, unspecified region other than cervical   . Thrombocytopenia, unspecified ([place])   . Type II or unspecified type diabetes mellitus without mention of complication, not stated as uncontrolled    Pt states that it is prediabetic  .  Unspecified arthropathy, pelvic region and thigh   . Unspecified hereditary and idiopathic peripheral neuropathy     Patient Active Problem List   Diagnosis Date Noted  . Abdominal aortic aneurysm (AAA) without rupture ([place]) 11/28/2019  . Localized idiopathic skeletal hyperostosis 05/12/2018  . Status post total replacement of right hip 02/20/2018  . Unilateral primary osteoarthritis, right hip 01/15/2018  . Hole in colon [facility]) 04/18/2017  . DDD (degenerative disc disease), cervical 04/18/2017  . Hypokalemia 03/20/2016  . Paresthesia 02/08/2016  . Lumbar pain 07/05/2015  . Cough 07/05/2015  . Edema 06/21/2015  . Dysphagia, pharyngoesophageal phase 06/29/2014  . Indigestion 02/24/2014  . Pain in joint, shoulder region 09/15/2013  . Pain, knee 09/15/2013  . Obesity (BMI 30-39.9) 09/15/2013  . Pain in joint, pelvic region and thigh 09/15/2013  . Osteoarthritis   . Insomnia, unspecified   . Chronic pain syndrome   . Hyperlipemia   . DM type 2 with diabetic peripheral neuropathy [facility])     Past Surgical History:  Procedure Laterality Date  . CARDIAC CATHETERIZATION  01/31/2004   Dr Gwenlyn Found  . CARDIOVERSION N/A 07/12/2016   Procedure: CARDIOVERSION;  Surgeon: Pixie Casino, MD;  Location: [facility];  Service: Cardiovascular;  Laterality: N/A;  . COLONOSCOPY  08/09/2006   Dr Christian Mate, hemorrhoids/rectal fistula  . cystocopy  07/2006   Dr Harlow Asa  . NASAL SINUS SURGERY  1992  . PROSTATE SURGERY  1991  .  TOTAL HIP ARTHROPLASTY Right 02/20/2018   Procedure: RIGHT TOTAL HIP ARTHROPLASTY ANTERIOR APPROACH;  Surgeon: Mcarthur Rossetti, MD;  Location: [facility];  Service: Orthopedics;  Laterality: Right;       Family History  Problem Relation Age of Onset  . Prostate cancer Father   . Heart failure Mother   . Lung cancer Daughter   . Arthritis Sister     Social History   Tobacco Use  . Smoking status: Never Smoker  . Smokeless tobacco: Current User    Types: Chew    . Tobacco comment: Chews tobacco daily   Substance Use Topics  . Alcohol use: No  . Drug use: No    Home Medications Prior to Admission medications   Medication Sig Start Date End Date Taking? Authorizing Provider  apixaban (ELIQUIS) 5 MG TABS tablet Take one tablet by mouth twice daily to help prevent stroke related to atrial fibrillation 10/22/19  Yes Reed, Tiffany L, DO  cromolyn (OPTICROM) 4 % ophthalmic solution Place 1 drop into both eyes 4 (four) times daily. 11/25/18  Yes Ngetich, [name] C, NP  furosemide (LASIX) 40 MG tablet Take 1 tablet (40 mg total) by mouth 2 (two) times daily. 10/22/19  Yes Reed, Tiffany L, DO  hydroxychloroquine (PLAQUENIL) 200 MG tablet Take by mouth 2 (two) times daily.   Yes [provider]  morphine (MSIR) 15 MG tablet Take one tablet by mouth every 6 hours as needed for severe pain 11/20/19  Yes Ngetich, [name] C, NP  sulfamethoxazole-trimethoprim (BACTRIM DS) 800-160 MG tablet Take 1 tablet by mouth 2 (two) times daily. 06/22/19  Yes Reed, Tiffany L, DO  polyethylene glycol (MIRALAX / GLYCOLAX) 17 g packet Take 17 g by mouth daily. 11/28/19   Delia Heady, PA-C    Allergies    Patient has no known allergies.  Review of Systems   Review of Systems  Constitutional: Negative for appetite change, chills and fever.  HENT: Negative for ear pain, rhinorrhea, sneezing and sore throat.   Eyes: Negative for photophobia and visual disturbance.  Respiratory: Negative for cough, chest tightness, shortness of breath and wheezing.   Cardiovascular: Negative for chest pain and palpitations.  Gastrointestinal: Positive for abdominal pain and constipation. Negative for blood in stool, diarrhea, nausea and vomiting.  Genitourinary: Negative for dysuria, hematuria and urgency.  Musculoskeletal: Negative for myalgias.  Skin: Negative for rash.  Neurological: Negative for dizziness, weakness and light-headedness.    Physical Exam Updated Vital Signs BP (!) 142/90  (BP Location: Left Arm)   Pulse 90   Temp 97.8 F (36.6 C) (Oral)   Resp 18   SpO2 100%   Physical Exam Vitals and nursing note reviewed.  Constitutional:      General: He is not in acute distress.    Appearance: He is well-developed.  HENT:     Head: Normocephalic and atraumatic.     Nose: Nose normal.  Eyes:     General: No scleral icterus.       Left eye: No discharge.     Conjunctiva/sclera: Conjunctivae normal.  Cardiovascular:     Rate and Rhythm: Normal rate and regular rhythm.     Heart sounds: Normal heart sounds. No murmur. No friction rub. No gallop.   Pulmonary:     Effort: Pulmonary effort is normal. No respiratory distress.     Breath sounds: Normal breath sounds.  Abdominal:     General: Bowel sounds are normal. There is no distension.  Palpations: Abdomen is soft.     Tenderness: There is abdominal tenderness (Lower abdomen). There is no guarding or rebound.  Musculoskeletal:        General: Normal range of motion.     Cervical back: Normal range of motion and neck supple.  Skin:    General: Skin is warm and dry.     Findings: No rash.  Neurological:     Mental Status: He is alert.     Motor: No abnormal muscle tone.     Coordination: Coordination normal.     ED Results / Procedures / Treatments   Labs (all labs ordered are listed, but only abnormal results are displayed) Labs Reviewed  COMPREHENSIVE METABOLIC PANEL - Abnormal; Notable for the following components:      Result Value   Glucose, Bld 114 (*)    Creatinine, Ser 1.55 (*)    Total Bilirubin 1.6 (*)    GFR calc non Af Amer 41 (*)    GFR calc Af Amer 48 (*)    All other components within normal limits  CBC - Abnormal; Notable for the following components:   RBC 4.13 (*)    Hemoglobin 12.8 (*)    All other components within normal limits  URINALYSIS, ROUTINE W REFLEX MICROSCOPIC - Abnormal; Notable for the following components:   Ketones, ur 5 (*)    All other components within  normal limits  LIPASE, BLOOD    EKG None  Radiology CT ABDOMEN PELVIS W CONTRAST  Result Date: 11/28/2019 CLINICAL DATA:  Constipation and abdominal pain. EXAM: CT ABDOMEN AND PELVIS WITH CONTRAST TECHNIQUE: Multidetector CT imaging of the abdomen and pelvis was performed using the standard protocol following bolus administration of intravenous contrast. CONTRAST:  47mL OMNIPAQUE IOHEXOL 300 MG/ML  SOLN COMPARISON:  Abdominal radiograph dated 07/26/2014 and abdominal ultrasound dated 11/25/2009. FINDINGS: Lower chest: No acute abnormality. Hepatobiliary: No focal liver abnormality is seen. No gallstones, gallbladder wall thickening, or biliary dilatation. Pancreas: Unremarkable. No pancreatic ductal dilatation or surrounding inflammatory changes. Spleen: Normal in size without focal abnormality. Adrenals/Urinary Tract: Adrenal glands are unremarkable. Other than a 1.1 cm left renal cyst, the kidneys are normal, without renal calculi, focal lesion, or hydronephrosis. Bladder is unremarkable. Stomach/Bowel: Stomach is within normal limits. Appendix appears normal. No evidence of bowel wall thickening, distention, or inflammatory changes. Vascular/Lymphatic: An infrarenal abdominal aortic saccular aneurysm measures 3.2 cm in greatest dimension. Aortic atherosclerosis. No enlarged abdominal or pelvic lymph nodes. Reproductive: Prostate appears surgically absent. Other: No abdominal wall hernia or abnormality. No abdominopelvic ascites. Musculoskeletal: Degenerative changes are seen in the spine with bilateral pars defects at L5. A right hip arthroplasty is present. IMPRESSION: 1. No acute process in the abdomen or pelvis. No findings to explain the patient's symptoms. 2. Infrarenal abdominal aortic aneurysm measures 3.2 cm in greatest dimension. Recommend followup by ultrasound in 3 years. This recommendation follows ACR consensus guidelines: White Paper of the ACR Incidental Findings Committee II on Vascular  Findings. J Am Coll Radiol 2013; 10:789-794. Aortic aneurysm NOS (ICD10-I71.9) Aortic Atherosclerosis (ICD10-I70.0). Electronically Signed   By: Zerita Boers M.D.   On: 11/28/2019 17:49    Procedures Procedures (including critical care time)  Medications Ordered in ED Medications  sodium chloride flush (NS) 0.9 % injection 3 mL (has no administration in time range)  sodium chloride 0.9 % bolus 1,000 mL (0 mLs Intravenous Stopped 11/28/19 1848)  iohexol (OMNIPAQUE) 300 MG/ML solution 75 mL (75 mLs Intravenous Contrast Given 11/28/19 1722)  ED Course  I have reviewed the triage vital signs and the nursing notes.  Pertinent labs & imaging results that were available during my care of the patient were reviewed by me and considered in my medical decision making (see chart for details).    MDM Rules/Calculators/A&P                      84 year old male presents to ED with a chief complaint of constipation.  He states for the past 5 days he has had lower abdominal cramping and decrease in bowel movements.  He tried to administer an enema and take a laxative last night which only produced a small bowel movement.  However he still feels like he is constipated.  He does report some urinary hesitancy but denies any dysuria, hematuria, vomiting.  He denies history of obstruction in the past but does note that in 1991 he had a colostomy that underwent a revision subsequently.  On exam abdomen is tender in the lower area without rebound or guarding.  He is afebrile without recent use of antipyretics.  CMP with creatinine of 1.55 which is similar to baseline.  CBC unremarkable.  Urinalysis without signs of infection.  CT of abdomen pelvis without any acute findings or concerns for obstruction.  There was an incidental infrarenal AAA measuring 3.3 cm.  Informed patient of this finding and told him to inform his PCP about this as well.  In the meantime we will give him MiraLAX to help with constipation and have  him increase his fluid intake as well.  Do not feel that enema for fecal disimpaction is indicated at this time if he is not having large amount of stool at the rectum.  Patient is agreeable to the plan.  We will have him return for worsening symptoms.  All imaging, if done today, including plain films, CT scans, and ultrasounds, independently reviewed by me, and interpretations confirmed via formal radiology reads.  Patient is hemodynamically stable, in NAD, and able to ambulate in the ED. Evaluation does not show pathology that would require ongoing emergent intervention or inpatient treatment. I explained the diagnosis to the patient. Pain has been managed and has no complaints prior to discharge. Patient is comfortable with above plan and is stable for discharge at this time. All questions were answered prior to disposition. Strict return precautions for returning to the ED were discussed. Encouraged follow up with PCP.   An After Visit Summary was printed and given to the patient.   Portions of this note were generated with Lobby[employer]. Dictation errors may occur despite best attempts at proofreading.  Final Clinical Impression(s) / ED Diagnoses Final diagnoses:  Abdominal aortic aneurysm (AAA) without rupture (HCC)  Slow transit constipation    Rx / DC Orders ED Discharge Orders         Ordered    polyethylene glycol (MIRALAX / GLYCOLAX) 17 g packet  Daily     11/28/19 1846           Delia Heady, PA-C 11/28/19 1850    Lucrezia Starch, MD 11/29/19 1724

## 2019-11-28 NOTE — Discharge Instructions (Signed)
Your CT scan showed that you have a 3.2 cm aortic aneurysm.  You will need to inform your primary care provider about this for repeat imaging in 3 years. You can take 4 capfuls of MiraLAX followed by 3 capfuls tomorrow followed by 2 if needed. Make sure you are drinking plenty of fluids to prevent dehydration. Follow-up with your primary care provider. Return to the ER for any worsening abdominal pain, bloody stools, losing control of your bowels or bladder, injuries or falls or chest pain.

## 2019-11-28 NOTE — ED Notes (Signed)
Patient verbalizes understanding of discharge instructions . Opportunity for questions and answers were provided . Armband removed by staff ,Pt discharged from ED. W/C  offered at D/C  and Declined W/C at D/C and was escorted to lobby by RN.  

## 2019-11-30 ENCOUNTER — Other Ambulatory Visit: Payer: Self-pay | Admitting: *Deleted

## 2019-11-30 NOTE — Patient Outreach (Signed)
  [facility]) Care Management Chronic Special Needs Program    11/30/2019  Name: Ronnie Jackson, DOB: Feb 11, 1936  MRN: [id number]   [name] is enrolled in a chronic special needs plan for Diabetes.  RN care manager received notification client was in ED on 11/28/19 after 5 days of constipation and abdominal cramping.  Client evaluated and discharged home with miralax, also findings of incidental infrarenal AAA 3.3 cm, client notified of findings per ED note and instructed to speak with his primary care provider about the findings. Outreach call to client for follow up, spoke with client's wife who reports she is not at home but as far as she knows client is feeling better and taking miralax.  Client states when she gets and speaks with client further she may call RN care manager back if needed or any questions.  Jacqlyn Larsen [facility], [place] Coordinator [phone]

## 2019-12-07 ENCOUNTER — Encounter: Payer: Self-pay | Admitting: Internal Medicine

## 2019-12-07 ENCOUNTER — Other Ambulatory Visit: Payer: Self-pay

## 2019-12-07 ENCOUNTER — Ambulatory Visit (INDEPENDENT_AMBULATORY_CARE_PROVIDER_SITE_OTHER): Payer: HMO | Admitting: Internal Medicine

## 2019-12-07 VITALS — BP 138/78 | HR 82 | Temp 97.1°F | Ht 70.0 in | Wt 202.0 lb

## 2019-12-07 DIAGNOSIS — M5136 Other intervertebral disc degeneration, lumbar region: Secondary | ICD-10-CM

## 2019-12-07 DIAGNOSIS — M48062 Spinal stenosis, lumbar region with neurogenic claudication: Secondary | ICD-10-CM

## 2019-12-07 DIAGNOSIS — Z720 Tobacco use: Secondary | ICD-10-CM

## 2019-12-07 DIAGNOSIS — E7800 Pure hypercholesterolemia, unspecified: Secondary | ICD-10-CM | POA: Diagnosis not present

## 2019-12-07 DIAGNOSIS — M4820 Kissing spine, site unspecified: Secondary | ICD-10-CM | POA: Diagnosis not present

## 2019-12-07 DIAGNOSIS — G894 Chronic pain syndrome: Secondary | ICD-10-CM

## 2019-12-07 DIAGNOSIS — M159 Polyosteoarthritis, unspecified: Secondary | ICD-10-CM | POA: Diagnosis not present

## 2019-12-07 DIAGNOSIS — M51369 Other intervertebral disc degeneration, lumbar region without mention of lumbar back pain or lower extremity pain: Secondary | ICD-10-CM

## 2019-12-07 DIAGNOSIS — E1142 Type 2 diabetes mellitus with diabetic polyneuropathy: Secondary | ICD-10-CM | POA: Diagnosis not present

## 2019-12-07 DIAGNOSIS — I4819 Other persistent atrial fibrillation: Secondary | ICD-10-CM

## 2019-12-07 MED FILL — HYDROXYCHLOROQUINE 200 MG T: 200 | 30 days supply | Qty: 60 | Fill #1

## 2019-12-07 NOTE — Progress Notes (Signed)
Location:  [facility] clinic Provider:  Galina Haddox L. Mariea Clonts, D.O., C.M.D.  Code Status: full code Goals of Care:  Advanced Directives 12/07/2019  Does Patient Have a Medical Advance Directive? No  Would patient like information on creating a medical advance directive? No - Patient declined     Chief Complaint  Patient presents with  . Medical Management of Chronic Issues     Follow up from hospital visit on 11/28/2019    HPI: Patient is a 84 y.o. male seen today for medical management of chronic diseases and to f/u from ED visit 11/28/19 for constipation.    Left his hearing aid fully charged.    He's felt kind of bad--aching, hurting, felt cold and sick.  Heat is set on 80 degrees.    Left foot is swollen.  It hurts worse than the right.  He's been eating a lot of canned food (made a veggie soup with canned vegetables in it).  He has a f/u with Dr. Amil Amen in two weeks about his arthritis.  His feet keep hurting.  He has neuropathy in his feet.  He wants him to look at them.  He is still taking the hydroxychloroquine--he thinks it has helpled a lot.  He'd seen doctors on tv say that helped bad arthritis (this was amid the Trump covid mess).    He is moving his bowels ok now with the miralax.  He was concerned that corn was coming out looking like corn and I reviewed that corn does not fully digest and that is normal.    He has lost 7 more lbs since March 29th.  He has been eating better/healthier.  Last hba1c was 6.4.    An infrarenal AAA was noted on his CT of abdomen during his ED visit.  It measured only 3.2 cm.  We reviewed importance of good bp control to prevent expansion.    Past Medical History:  Diagnosis Date  . Anxiety state, unspecified   . Atrial fibrillation ([place]) 03/06/2016  . Chronic pain syndrome   . Constipation   . Depressive disorder, not elsewhere classified   . Dysphagia, unspecified(787.20)   . Eye exam normal 09/01/2019   Diabetic eye exam did not reveal any  retinopathy.   . Herpes simplex disease   . Hyperlipidemia   . Hypertension   . Hypopotassemia   . Insomnia, unspecified   . Intestinovesical fistula   . Malignant neoplasm of prostate ([place])   . Myalgia and myositis, unspecified   . Osteoarthritis   . Pain in joint, lower leg    Bilateral knee pains  . Reflux esophagitis   . Rheumatoid arthritis with rheumatoid factor (HCC)   . Spinal stenosis, unspecified region other than cervical   . Thrombocytopenia, unspecified ([place])   . Type II or unspecified type diabetes mellitus without mention of complication, not stated as uncontrolled    Pt states that it is prediabetic  . Unspecified arthropathy, pelvic region and thigh   . Unspecified hereditary and idiopathic peripheral neuropathy     Past Surgical History:  Procedure Laterality Date  . CARDIAC CATHETERIZATION  01/31/2004   Dr Gwenlyn Found  . CARDIOVERSION N/A 07/12/2016   Procedure: CARDIOVERSION;  Surgeon: Pixie Casino, MD;  Location: [facility];  Service: Cardiovascular;  Laterality: N/A;  . COLONOSCOPY  08/09/2006   Dr Christian Mate, hemorrhoids/rectal fistula  . cystocopy  07/2006   Dr Harlow Asa  . NASAL SINUS SURGERY  1992  . PROSTATE SURGERY  1991  .  TOTAL HIP ARTHROPLASTY Right 02/20/2018   Procedure: RIGHT TOTAL HIP ARTHROPLASTY ANTERIOR APPROACH;  Surgeon: Mcarthur Rossetti, MD;  Location: [facility];  Service: Orthopedics;  Laterality: Right;    No Known Allergies  Outpatient Encounter Medications as of 12/07/2019  Medication Sig  . apixaban (ELIQUIS) 5 MG TABS tablet Take one tablet by mouth twice daily to help prevent stroke related to atrial fibrillation  . cromolyn (OPTICROM) 4 % ophthalmic solution Place 1 drop into both eyes 4 (four) times daily.  . furosemide (LASIX) 40 MG tablet Take 1 tablet (40 mg total) by mouth 2 (two) times daily.  . hydroxychloroquine (PLAQUENIL) 200 MG tablet Take by mouth 2 (two) times daily.  Marland Kitchen morphine (MSIR) 15 MG tablet Take one  tablet by mouth every 6 hours as needed for severe pain  . polyethylene glycol (MIRALAX / GLYCOLAX) 17 g packet Take 17 g by mouth daily.  Marland Kitchen sulfamethoxazole-trimethoprim (BACTRIM DS) 800-160 MG tablet Take 1 tablet by mouth 2 (two) times daily.   No facility-administered encounter medications on file as of 12/07/2019.    Review of Systems:  Review of Systems  Constitutional: Positive for malaise/fatigue. Negative for chills and fever.  HENT: Positive for hearing loss. Negative for congestion and sore throat.   Eyes: Negative for blurred vision.       Glasses  Respiratory: Negative for cough and shortness of breath.   Cardiovascular: Positive for leg swelling. Negative for chest pain, palpitations, orthopnea and PND.  Gastrointestinal: Positive for constipation. Negative for abdominal pain, blood in stool, diarrhea, melena, nausea and vomiting.  Genitourinary: Negative for dysuria.  Musculoskeletal: Positive for back pain and joint pain. Negative for falls.  Skin: Negative for itching and rash.  Neurological: Positive for tingling and sensory change. Negative for dizziness and loss of consciousness.  Psychiatric/Behavioral: Negative for depression and memory loss. The patient is not nervous/anxious and does not have insomnia.     Health Maintenance  Topic Date Due  . FOOT EXAM  12/22/2015  . COVID-19 Vaccine (1) 12/13/2019 (Originally 08/02/1951)  . TETANUS/TDAP  11/01/2024 (Originally 08/02/1954)  . INFLUENZA VACCINE  02/21/2020  . HEMOGLOBIN A1C  04/20/2020  . OPHTHALMOLOGY EXAM  08/31/2020  . URINE MICROALBUMIN  10/18/2020  . PNA vac Low Risk Adult  Completed    Physical Exam: Vitals:   12/07/19 1129  BP: 138/78  Pulse: 82  Temp: (!) 97.1 F (36.2 C)  TempSrc: Temporal  SpO2: 98%  Weight: 202 lb (91.6 kg)  Height: 5\' 10"  (1.778 m)   Body mass index is 28.98 kg/m. Physical Exam Vitals reviewed.  Constitutional:      General: He is not in acute distress.     Appearance: Normal appearance. He is not toxic-appearing.  HENT:     Head: Normocephalic and atraumatic.     Ears:     Comments: Not wearing hearing aids Cardiovascular:     Rate and Rhythm: Normal rate and regular rhythm.  Pulmonary:     Effort: Pulmonary effort is normal.     Breath sounds: Normal breath sounds.  Abdominal:     General: Bowel sounds are normal. There is no distension.     Palpations: Abdomen is soft. There is no mass.     Tenderness: There is no abdominal tenderness.  Musculoskeletal:        General: Normal range of motion.     Right lower leg: Edema present.     Left lower leg: Edema present.  Skin:  General: Skin is warm and dry.  Neurological:     General: No focal deficit present.     Mental Status: He is alert and oriented to person, place, and time.     Gait: Gait abnormal.     Comments: Stiff and slow to start when stands up to walk  Psychiatric:        Mood and Affect: Mood normal.     Comments: Usual defensive self about his pain medication     Labs reviewed: Basic Metabolic Panel: Recent Labs    12/09/18 0855 10/19/19 1540 11/28/19 1505  NA 140 141 140  K 4.6 3.6 3.7  CL 106 102 103  CO2 28 29 26   GLUCOSE 116* 114* 114*  BUN 20 16 18   CREATININE 1.26* 1.22* 1.55*  CALCIUM 8.8 9.2 9.1   Liver Function Tests: Recent Labs    12/09/18 0855 10/19/19 1540 11/28/19 1505  AST 20 21 31   ALT 11 11 19   ALKPHOS  --   --  77  BILITOT 0.6 1.4* 1.6*  PROT 6.5 7.6 7.5  ALBUMIN  --   --  4.5   Recent Labs    11/28/19 1505  LIPASE 30   No results for input(s): AMMONIA in the last 8760 hours. CBC: Recent Labs    12/09/18 0855 10/19/19 1540 11/28/19 1505  WBC 6.4 8.0 9.6  NEUTROABS 3,334 5,200  --   HGB 12.3* 13.4 12.8*  HCT 36.8* 39.8 39.7  MCV 94.1 92.3 96.1  PLT 165 174 167   Lipid Panel: Recent Labs    12/09/18 0855 10/19/19 1540  CHOL 139 162  HDL 36* 41  LDLCALC 85 103*  TRIG 85 89  CHOLHDL 3.9 4.0   Lab  Results  Component Value Date   HGBA1C 6.4 (H) 10/19/2019    Procedures since last visit: CT ABDOMEN PELVIS W CONTRAST  Result Date: 11/28/2019 CLINICAL DATA:  Constipation and abdominal pain. EXAM: CT ABDOMEN AND PELVIS WITH CONTRAST TECHNIQUE: Multidetector CT imaging of the abdomen and pelvis was performed using the standard protocol following bolus administration of intravenous contrast. CONTRAST:  19mL OMNIPAQUE IOHEXOL 300 MG/ML  SOLN COMPARISON:  Abdominal radiograph dated 07/26/2014 and abdominal ultrasound dated 11/25/2009. FINDINGS: Lower chest: No acute abnormality. Hepatobiliary: No focal liver abnormality is seen. No gallstones, gallbladder wall thickening, or biliary dilatation. Pancreas: Unremarkable. No pancreatic ductal dilatation or surrounding inflammatory changes. Spleen: Normal in size without focal abnormality. Adrenals/Urinary Tract: Adrenal glands are unremarkable. Other than a 1.1 cm left renal cyst, the kidneys are normal, without renal calculi, focal lesion, or hydronephrosis. Bladder is unremarkable. Stomach/Bowel: Stomach is within normal limits. Appendix appears normal. No evidence of bowel wall thickening, distention, or inflammatory changes. Vascular/Lymphatic: An infrarenal abdominal aortic saccular aneurysm measures 3.2 cm in greatest dimension. Aortic atherosclerosis. No enlarged abdominal or pelvic lymph nodes. Reproductive: Prostate appears surgically absent. Other: No abdominal wall hernia or abnormality. No abdominopelvic ascites. Musculoskeletal: Degenerative changes are seen in the spine with bilateral pars defects at L5. A right hip arthroplasty is present. IMPRESSION: 1. No acute process in the abdomen or pelvis. No findings to explain the patient's symptoms. 2. Infrarenal abdominal aortic aneurysm measures 3.2 cm in greatest dimension. Recommend followup by ultrasound in 3 years. This recommendation follows ACR consensus guidelines: White Paper of the ACR Incidental  Findings Committee II on Vascular Findings. J Am Coll Radiol 2013; 10:789-794. Aortic aneurysm NOS (ICD10-I71.9) Aortic Atherosclerosis (ICD10-I70.0). Electronically Signed   By: Harley Hallmark.D.  On: 11/28/2019 17:49    Assessment/Plan 1. DM type 2 with diabetic peripheral neuropathy (HCC) - has been improving with better diet, still does not exercise secondary to pain -cont diet only - Hemoglobin A1c; Future  2. Chronic pain syndrome -cont MSIR, hydroxychloroquine which he thinks is helping him, needs regular eye exams due to it and his DMII - CBC with Differential/Platelet; Future - COMPLETE METABOLIC PANEL WITH GFR; Future - Lipid panel; Future  3. Generalized osteoarthritis of multiple sites -a bit better lately, cont same mgt  4. Neurogenic claudication due to lumbar spinal stenosis -cause of pain in feet, I'd put him on gabapentin at one time, but he stopped it because he didn't feel better right away, may need to resume  5. Localized idiopathic skeletal hyperostosis -seeing rheum now--Dr. Amil Amen  6. Pure hypercholesterolemia -not on meds at his choice, cont to work on improving diet and f/u labs - COMPLETE METABOLIC PANEL WITH GFR; Future - Lipid panel; Future  7. Persistent atrial fibrillation (HCC) -cont eliquis anticoagulation, not requiring rate control - CBC with Differential/Platelet; Future - COMPLETE METABOLIC PANEL WITH GFR; Future  8. DDD (degenerative disc disease), lumbar -cont morphine and hydroxychloroquine therapy  9. Tobacco abuse -spent more than 3 mins counseling about cessation of chewing tobacco packets--he is going to gradually cut down how much he's using  Labs/tests ordered:   Lab Orders     CBC with Differential/Platelet     COMPLETE METABOLIC PANEL WITH GFR     Lipid panel     Hemoglobin A1c  Next appt:  04/11/2020  Diego Delancey L. Tarry Blayney, D.O. [facility] [address], [place]  [address] Cell Phone (Mon-Fri 8am-5pm):  [phone] On Call:  [phone] & follow prompts after 5pm & weekends Office Phone:  [phone] Office Fax:  [phone]

## 2019-12-07 NOTE — Patient Instructions (Addendum)
Keep taking the miralax every day.  Please remember your hearing aid for appointments.  Avoid adding salt to food.  Cut down on how much tobacco you chew at a time.  Gradually wean it.

## 2019-12-23 MED FILL — ELIQUIS 5 MG TABLET: 5 | 30 days supply | Qty: 60 | Fill #2

## 2019-12-23 MED FILL — FUROSEMIDE 40 MG TAB: 40 | 30 days supply | Qty: 60 | Fill #2

## 2019-12-23 MED FILL — SULFAMETHOXAZOLE-TMP DS TAB: 800-160 | 60 days supply | Qty: 60 | Fill #2

## 2019-12-29 ENCOUNTER — Ambulatory Visit: Payer: HMO | Admitting: Podiatry

## 2020-01-21 MED FILL — ELIQUIS 5 MG TABLET: 5 | 15 days supply | Qty: 30 | Fill #3

## 2020-01-21 MED FILL — FUROSEMIDE 40 MG TAB: 40 | 30 days supply | Qty: 60 | Fill #3

## 2020-01-29 ENCOUNTER — Encounter: Payer: Self-pay | Admitting: Family

## 2020-01-29 ENCOUNTER — Ambulatory Visit (INDEPENDENT_AMBULATORY_CARE_PROVIDER_SITE_OTHER): Payer: HMO | Admitting: Family

## 2020-01-29 ENCOUNTER — Emergency Department (HOSPITAL_COMMUNITY)
Admission: EM | Admit: 2020-01-29 | Discharge: 2020-01-29 | Disposition: A | Discharge status: Home / Self Care | Payer: HMO | Attending: Emergency Medicine | Admitting: Emergency Medicine

## 2020-01-29 ENCOUNTER — Emergency Department (HOSPITAL_BASED_OUTPATIENT_CLINIC_OR_DEPARTMENT_OTHER): Payer: HMO

## 2020-01-29 ENCOUNTER — Telehealth: Payer: Self-pay | Admitting: Cardiology

## 2020-01-29 ENCOUNTER — Other Ambulatory Visit: Payer: Self-pay

## 2020-01-29 VITALS — BP 140/100 | HR 87 | Temp 98.0°F | Resp 16 | Ht 70.0 in | Wt 202.6 lb

## 2020-01-29 DIAGNOSIS — R030 Elevated blood-pressure reading, without diagnosis of hypertension: Secondary | ICD-10-CM

## 2020-01-29 DIAGNOSIS — M79609 Pain in unspecified limb: Secondary | ICD-10-CM | POA: Diagnosis not present

## 2020-01-29 DIAGNOSIS — I10 Essential (primary) hypertension: Secondary | ICD-10-CM | POA: Insufficient documentation

## 2020-01-29 DIAGNOSIS — R600 Localized edema: Secondary | ICD-10-CM | POA: Diagnosis not present

## 2020-01-29 DIAGNOSIS — F17220 Nicotine dependence, chewing tobacco, uncomplicated: Secondary | ICD-10-CM | POA: Insufficient documentation

## 2020-01-29 DIAGNOSIS — L03116 Cellulitis of left lower limb: Secondary | ICD-10-CM | POA: Diagnosis not present

## 2020-01-29 DIAGNOSIS — M79662 Pain in left lower leg: Secondary | ICD-10-CM

## 2020-01-29 DIAGNOSIS — R609 Edema, unspecified: Secondary | ICD-10-CM

## 2020-01-29 DIAGNOSIS — L03115 Cellulitis of right lower limb: Secondary | ICD-10-CM | POA: Insufficient documentation

## 2020-01-29 DIAGNOSIS — E119 Type 2 diabetes mellitus without complications: Secondary | ICD-10-CM | POA: Insufficient documentation

## 2020-01-29 DIAGNOSIS — M7989 Other specified soft tissue disorders: Secondary | ICD-10-CM

## 2020-01-29 DIAGNOSIS — R2242 Localized swelling, mass and lump, left lower limb: Secondary | ICD-10-CM | POA: Diagnosis present

## 2020-01-29 DIAGNOSIS — Z7984 Long term (current) use of oral hypoglycemic drugs: Secondary | ICD-10-CM | POA: Insufficient documentation

## 2020-01-29 DIAGNOSIS — Z79899 Other long term (current) drug therapy: Secondary | ICD-10-CM | POA: Insufficient documentation

## 2020-01-29 MED ORDER — DOXYCYCLINE HYCLATE 100 MG PO TABS
100.0000 mg | ORAL_TABLET | Freq: Once | ORAL | Status: AC
  Administered 2020-01-29: 100 mg via ORAL
  Filled 2020-01-29: qty 1

## 2020-01-29 MED ORDER — DOXYCYCLINE HYCLATE 100 MG PO CAPS
100.0000 mg | ORAL_CAPSULE | Freq: Two times a day (BID) | ORAL | 0 refills | Status: DC
Start: 2020-01-29 — End: 2020-08-08

## 2020-01-29 NOTE — Patient Instructions (Signed)
Please go to the ED for evaluation of left leg/foot pain and swelling .

## 2020-01-29 NOTE — Discharge Instructions (Addendum)
Please keep your foot elevated Watch for increasing redness, streaking, or fever Return if you note any of the above. Recheck with your doctor on Monday.

## 2020-01-29 NOTE — Telephone Encounter (Signed)
Pt c/o swelling: STAT is pt has developed SOB within 24 hours  1) How much weight have you gained and in what time span? Patients wife said patient has gained any weight.   2) If swelling, where is the swelling located? Both legs from knees to his feet, left is worse than his right  3) Are you currently taking a fluid pill? furosemide (LASIX) 40 MG tablet, patient states patient has been taking an extra fluid pill   4) Are you currently SOB? Not SOB, just really tired and weak   5) Do you have a log of your daily weights (if so, list)? No   6) Have you gained 3 pounds in a day or 5 pounds in a week? No  7) Have you traveled recently? No  Patient's wife, [name], stated that patients feet are so swollen that they look like they are about to pop. She also stated that he can barely walk, he is having to use crutches.

## 2020-01-29 NOTE — ED Provider Notes (Signed)
[facility] Provider Note   CSN: [id number] Arrival date & time: 01/29/20  1616     History Chief Complaint  Patient presents with  . Foot Swelling    Ronnie Jackson is a 83 y.o. male.  HPI    84 year old male presents today complaining of left redness and swelling to left foot.  He states that he has had similar swelling in the past.  Both feet usually swell but the left more than the right.  This has become gradually red over the past several days and has had increased pain.  He has not noted any fever or chills.  Has an abrasion to the mid leg.  Prior to my evaluation he had a DVT study that was negative for DVT in the left lower extremity. Past Medical History:  Diagnosis Date  . Anxiety state, unspecified   . Atrial fibrillation ([place]) 03/06/2016  . Chronic pain syndrome   . Constipation   . Depressive disorder, not elsewhere classified   . Dysphagia, unspecified(787.20)   . Eye exam normal 09/01/2019   Diabetic eye exam did not reveal any retinopathy.   . Herpes simplex disease   . Hyperlipidemia   . Hypertension   . Hypopotassemia   . Insomnia, unspecified   . Intestinovesical fistula   . Malignant neoplasm of prostate ([place])   . Myalgia and myositis, unspecified   . Osteoarthritis   . Pain in joint, lower leg    Bilateral knee pains  . Reflux esophagitis   . Rheumatoid arthritis with rheumatoid factor (HCC)   . Spinal stenosis, unspecified region other than cervical   . Thrombocytopenia, unspecified ([place])   . Type II or unspecified type diabetes mellitus without mention of complication, not stated as uncontrolled    Pt states that it is prediabetic  . Unspecified arthropathy, pelvic region and thigh   . Unspecified hereditary and idiopathic peripheral neuropathy     Patient Active Problem List   Diagnosis Date Noted  . Abdominal aortic aneurysm (AAA) without rupture ([place]) 11/28/2019  . Localized idiopathic skeletal  hyperostosis 05/12/2018  . Status post total replacement of right hip 02/20/2018  . Unilateral primary osteoarthritis, right hip 01/15/2018  . Hole in colon [facility]) 04/18/2017  . DDD (degenerative disc disease), cervical 04/18/2017  . Hypokalemia 03/20/2016  . Paresthesia 02/08/2016  . Lumbar pain 07/05/2015  . Cough 07/05/2015  . Edema 06/21/2015  . Dysphagia, pharyngoesophageal phase 06/29/2014  . Indigestion 02/24/2014  . Pain in joint, shoulder region 09/15/2013  . Pain, knee 09/15/2013  . Obesity (BMI 30-39.9) 09/15/2013  . Pain in joint, pelvic region and thigh 09/15/2013  . Osteoarthritis   . Insomnia, unspecified   . Chronic pain syndrome   . Hyperlipemia   . DM type 2 with diabetic peripheral neuropathy [facility])     Past Surgical History:  Procedure Laterality Date  . CARDIAC CATHETERIZATION  01/31/2004   Dr Gwenlyn Found  . CARDIOVERSION N/A 07/12/2016   Procedure: CARDIOVERSION;  Surgeon: Pixie Casino, MD;  Location: [facility];  Service: Cardiovascular;  Laterality: N/A;  . COLONOSCOPY  08/09/2006   Dr Christian Mate, hemorrhoids/rectal fistula  . cystocopy  07/2006   Dr Harlow Asa  . NASAL SINUS SURGERY  1992  . PROSTATE SURGERY  1991  . TOTAL HIP ARTHROPLASTY Right 02/20/2018   Procedure: RIGHT TOTAL HIP ARTHROPLASTY ANTERIOR APPROACH;  Surgeon: Mcarthur Rossetti, MD;  Location: [facility];  Service: Orthopedics;  Laterality: Right;  Family History  Problem Relation Age of Onset  . Prostate cancer Father   . Heart failure Mother   . Lung cancer Daughter   . Arthritis Sister     Social History   Tobacco Use  . Smoking status: Never Smoker  . Smokeless tobacco: Current User    Types: Chew  . Tobacco comment: Chews tobacco daily   Vaping Use  . Vaping Use: Never used  Substance Use Topics  . Alcohol use: No  . Drug use: No    Home Medications Prior to Admission medications   Medication Sig Start Date End Date Taking? Authorizing Provider  apixaban  (ELIQUIS) 5 MG TABS tablet Take one tablet by mouth twice daily to help prevent stroke related to atrial fibrillation Patient taking differently: Take 5 mg by mouth 2 (two) times daily.  10/22/19  Yes Reed, Tiffany L, DO  cromolyn (OPTICROM) 4 % ophthalmic solution Place 1 drop into both eyes 4 (four) times daily. Patient taking differently: Place 1 drop into both eyes in the morning and at bedtime.  11/25/18  Yes Ngetich, [name] C, NP  furosemide (LASIX) 40 MG tablet Take 1 tablet (40 mg total) by mouth 2 (two) times daily. 10/22/19  Yes Reed, Tiffany L, DO  hydroxychloroquine (PLAQUENIL) 200 MG tablet Take 200 mg by mouth 2 (two) times daily.    Yes [provider]  morphine (MSIR) 15 MG tablet Take one tablet by mouth every 6 hours as needed for severe pain 11/20/19  Yes Ngetich, [name] C, NP  polyethylene glycol (MIRALAX / GLYCOLAX) 17 g packet Take 17 g by mouth daily. 11/28/19  Yes Khatri, Hina, PA-C    Allergies    Patient has no known allergies.  Review of Systems   Review of Systems  Physical Exam Updated Vital Signs BP (!) 137/95   Pulse 71   Temp 98.4 F (36.9 C) (Oral)   Resp 15   SpO2 99%   Physical Exam Vitals and nursing note reviewed.  Constitutional:      Appearance: He is well-developed.  HENT:     Head: Normocephalic and atraumatic.     Right Ear: External ear normal.     Left Ear: External ear normal.     Nose: Nose normal.  Eyes:     Conjunctiva/sclera: Conjunctivae normal.     Pupils: Pupils are equal, round, and reactive to light.  Cardiovascular:     Rate and Rhythm: Normal rate and regular rhythm.     Heart sounds: Normal heart sounds.  Pulmonary:     Effort: Pulmonary effort is normal. No respiratory distress.     Breath sounds: Normal breath sounds. No wheezing.  Chest:     Chest wall: No tenderness.  Abdominal:     General: Bowel sounds are normal. There is no distension.     Palpations: Abdomen is soft. There is no mass.     Tenderness: There  is no abdominal tenderness. There is no guarding.  Musculoskeletal:        General: Swelling and tenderness present. Normal range of motion.     Cervical back: Normal range of motion and neck supple.     Comments: Bilateral lower extremity edema with left somewhat greater than right.  Left foot has some erythema on the dorsal aspect and is slightly warm.  There are no open wounds or fluctuance noted Left lower leg has an abrasion noted on the anterior upper third.  There is no surrounding erythema redness  or tenderness.  Skin:    General: Skin is warm and dry.     Capillary Refill: Capillary refill takes less than 2 seconds.  Neurological:     Mental Status: He is alert and oriented to person, place, and time.     Motor: No abnormal muscle tone.     Coordination: Coordination normal.     Deep Tendon Reflexes: Reflexes are normal and symmetric.  Psychiatric:        Behavior: Behavior normal.        Thought Content: Thought content normal.        Judgment: Judgment normal.     ED Results / Procedures / Treatments   Labs (all labs ordered are listed, but only abnormal results are displayed) Labs Reviewed - No data to display  EKG None  Radiology VAS [place] LOWER EXTREMITY VENOUS (DVT) (ONLY [facility] & [facility])  Result Date: 01/29/2020  Lower Venous DVTStudy Indications: Swelling, and Pain.  Comparison Study: no prior Performing Technologist: Abram Sander RVS  Examination Guidelines: A complete evaluation includes B-mode imaging, spectral Doppler, color Doppler, and power Doppler as needed of all accessible portions of each vessel. Bilateral testing is considered an integral part of a complete examination. Limited examinations for reoccurring indications may be performed as noted. The reflux portion of the exam is performed with the patient in reverse Trendelenburg.  +-----+---------------+---------+-----------+----------+--------------+ RIGHTCompressibilityPhasicitySpontaneityPropertiesThrombus  Aging +-----+---------------+---------+-----------+----------+--------------+ CFV  Full           Yes      Yes                                 +-----+---------------+---------+-----------+----------+--------------+   +---------+---------------+---------+-----------+----------+--------------+ LEFT     CompressibilityPhasicitySpontaneityPropertiesThrombus Aging +---------+---------------+---------+-----------+----------+--------------+ CFV      Full           Yes      Yes                                 +---------+---------------+---------+-----------+----------+--------------+ SFJ      Full                                                        +---------+---------------+---------+-----------+----------+--------------+ FV Prox  Full                                                        +---------+---------------+---------+-----------+----------+--------------+ FV Mid   Full                                                        +---------+---------------+---------+-----------+----------+--------------+ FV DistalFull                                                        +---------+---------------+---------+-----------+----------+--------------+  PFV      Full                                                        +---------+---------------+---------+-----------+----------+--------------+ POP      Full           Yes      Yes                                 +---------+---------------+---------+-----------+----------+--------------+ PTV      Full                                                        +---------+---------------+---------+-----------+----------+--------------+ PERO     Full                                                        +---------+---------------+---------+-----------+----------+--------------+     Summary: RIGHT: - No evidence of common femoral vein obstruction.  LEFT: - There is no evidence of deep vein thrombosis in  the lower extremity.  - No cystic structure found in the popliteal fossa.  *See table(s) above for measurements and observations.    Preliminary     Procedures Procedures (including critical care time)  Medications Ordered in ED Medications  doxycycline (VIBRA-TABS) tablet 100 mg (has no administration in time range)    ED Course  I have reviewed the triage vital signs and the nursing notes.  Pertinent labs & imaging results that were available during my care of the patient were reviewed by me and considered in my medical decision making (see chart for details).    MDM Rules/Calculators/A&P                         84 year old male presents today with left foot red and somewhat warm.  Labs are reviewed and there is no evidence of leukocytosis.  Doppler was performed and there is no DVT.  Patient does not have pain localized over joint.  There is no obvious effusion noted.  Given the proximal abrasion and underlying edema it is possible that he has cellulitis.  He is started here on doxycycline.  We have discussed the need for close follow-up, return precautions, and treatment with elevation. Final Clinical Impression(s) / ED Diagnoses Final diagnoses:  Peripheral edema  Cellulitis of left foot    Rx / DC Orders ED Discharge Orders    None       Pattricia Boss, MD 01/29/20 2247

## 2020-01-29 NOTE — Progress Notes (Signed)
Provider: [name] Ngetich FNP-C  Gayland Curry, DO  Patient Care Team: Gayland Curry, DO as PCP - General (Geriatric Medicine) Constance Haw, MD as PCP - Cardiology (Cardiology) Tomasita Morrow, NP as Nurse Practitioner (Cardiology) Deneise Lever, MD as Consulting Physician (Pulmonary Disease) Kristeen Miss, MD as Consulting Physician (Neurosurgery) Ronny Bacon, MD as Referring Physician (Surgery) [name], Fransico Him., MD (General Surgery) Bo Merino, MD as Consulting Physician (Rheumatology) Clent Jacks, MD as Consulting Physician (Ophthalmology) Kassie Mends, RN as [place] Management  Extended Emergency Contact Information Primary Emergency Contact: Manus Gunning Address: [address]          [place], [place] [address] Johnnette Litter of [place] Phone: [phone] Relation: Spouse Secondary Emergency Contact: Wins[place], [place] Mobile Phone: [phone] Relation: Son  Code Status:  Full Code  Goals of care: Advanced Directive information Advanced Directives 01/29/2020  Does Patient Have a Medical Advance Directive? No  Would patient like information on creating a medical advance directive? No - Patient declined     Chief Complaint  Patient presents with  . Acute Visit    Complains of swollen legs and feet.    HPI:  Pt is a 84 y.o. male seen today for an acute visit for evaluation of bilateral lower extremities swelling worst than usual.I'm seeing him for the first time today.Usually follows up with PCP Dr.Reed.left leg swollen more than the right leg.Also states left leg hurnts a lot rates 6/10 on scale but 10/10 on standing.Pain is described as shooting.first day when pain started was burning and leg was red.usually takes furosemide 40 mg tablet twice daily but about a week he took only 40 mg tablet once a day because he thought swelling was better.Wife states since then the swelling has not improved.  B/p elevated  today suspect due to pain. He denies an headache,dizziness,palpitation or shortness of breath.He states gets tired.    Past Medical History:  Diagnosis Date  . Anxiety state, unspecified   . Atrial fibrillation ([place]) 03/06/2016  . Chronic pain syndrome   . Constipation   . Depressive disorder, not elsewhere classified   . Dysphagia, unspecified(787.20)   . Eye exam normal 09/01/2019   Diabetic eye exam did not reveal any retinopathy.   . Herpes simplex disease   . Hyperlipidemia   . Hypertension   . Hypopotassemia   . Insomnia, unspecified   . Intestinovesical fistula   . Malignant neoplasm of prostate ([place])   . Myalgia and myositis, unspecified   . Osteoarthritis   . Pain in joint, lower leg    Bilateral knee pains  . Reflux esophagitis   . Rheumatoid arthritis with rheumatoid factor (HCC)   . Spinal stenosis, unspecified region other than cervical   . Thrombocytopenia, unspecified ([place])   . Type II or unspecified type diabetes mellitus without mention of complication, not stated as uncontrolled    Pt states that it is prediabetic  . Unspecified arthropathy, pelvic region and thigh   . Unspecified hereditary and idiopathic peripheral neuropathy    Past Surgical History:  Procedure Laterality Date  . CARDIAC CATHETERIZATION  01/31/2004   Dr Gwenlyn Found  . CARDIOVERSION N/A 07/12/2016   Procedure: CARDIOVERSION;  Surgeon: Pixie Casino, MD;  Location: [facility];  Service: Cardiovascular;  Laterality: N/A;  . COLONOSCOPY  08/09/2006   Dr Christian Mate, hemorrhoids/rectal fistula  . cystocopy  07/2006   Dr Harlow Asa  . NASAL SINUS SURGERY  1992  . PROSTATE SURGERY  1991  .  TOTAL HIP ARTHROPLASTY Right 02/20/2018   Procedure: RIGHT TOTAL HIP ARTHROPLASTY ANTERIOR APPROACH;  Surgeon: Mcarthur Rossetti, MD;  Location: [facility];  Service: Orthopedics;  Laterality: Right;    No Known Allergies  Outpatient Encounter Medications as of 01/29/2020  Medication Sig  . apixaban  (ELIQUIS) 5 MG TABS tablet Take one tablet by mouth twice daily to help prevent stroke related to atrial fibrillation  . cromolyn (OPTICROM) 4 % ophthalmic solution Place 1 drop into both eyes 4 (four) times daily.  . furosemide (LASIX) 40 MG tablet Take 1 tablet (40 mg total) by mouth 2 (two) times daily.  . hydroxychloroquine (PLAQUENIL) 200 MG tablet Take by mouth 2 (two) times daily.  Marland Kitchen morphine (MSIR) 15 MG tablet Take one tablet by mouth every 6 hours as needed for severe pain  . polyethylene glycol (MIRALAX / GLYCOLAX) 17 g packet Take 17 g by mouth daily.  . [DISCONTINUED] sulfamethoxazole-trimethoprim (BACTRIM DS) 800-160 MG tablet Take 1 tablet by mouth 2 (two) times daily.   No facility-administered encounter medications on file as of 01/29/2020.    Review of Systems  Constitutional: Positive for fatigue. Negative for appetite change, chills and fever.  Respiratory: Negative for cough, chest tightness, shortness of breath and wheezing.   Cardiovascular: Positive for leg swelling. Negative for chest pain and palpitations.  Gastrointestinal: Negative for abdominal distention, abdominal pain, constipation, diarrhea, nausea and vomiting.  Musculoskeletal: Positive for arthralgias and back pain. Negative for joint swelling.  Skin: Negative for pallor, rash and wound.  Neurological: Negative for dizziness, speech difficulty, weakness, light-headedness and headaches.  Psychiatric/Behavioral: Negative for agitation. The patient is not nervous/anxious.        Unable to sleep at night due to left leg pain     Immunization History  Administered Date(s) Administered  . Fluad Quad(high Dose 65+) 10/19/2019  . Influenza, High Dose Seasonal PF 04/18/2017, 05/12/2018  . Influenza,inj,Quad PF,6+ Mos 06/21/2015  . Pneumococcal Conjugate-13 06/21/2015  . Pneumococcal Polysaccharide-23 11/15/2016   Pertinent  Health Maintenance Due  Topic Date Due  . FOOT EXAM  12/22/2015  . INFLUENZA VACCINE   02/21/2020  . HEMOGLOBIN A1C  04/20/2020  . OPHTHALMOLOGY EXAM  08/31/2020  . URINE MICROALBUMIN  10/18/2020  . PNA vac Low Risk Adult  Completed   Fall Risk  01/29/2020 12/07/2019 11/27/2019 11/05/2019 10/19/2019  Falls in the past year? 0 0 0 1 -  Number falls in past yr: 0 0 0 0 0  Injury with Fall? 0 0 0 0 0  Risk for fall due to : - - - History of fall(s);Impaired balance/gait;Impaired mobility -  Follow up - - - Education provided;Falls prevention discussed -   Functional Status Survey:    Vitals:   01/29/20 1501  BP: (!) 140/100  Pulse: 87  Resp: 16  Temp: 98 F (36.7 C)  SpO2: 98%  Weight: 202 lb 9.6 oz (91.9 kg)  Height: 5\' 10"  (1.778 m)   Body mass index is 29.07 kg/m. Physical Exam Constitutional:      General: He is not in acute distress.    Appearance: He is not ill-appearing.  Eyes:     General: No scleral icterus.       Right eye: No discharge.        Left eye: No discharge.     Extraocular Movements: Extraocular movements intact.     Conjunctiva/sclera: Conjunctivae normal.     Pupils: Pupils are equal, round, and reactive to light.  Cardiovascular:  Rate and Rhythm: Normal rate and regular rhythm.     Pulses: Normal pulses.     Heart sounds: Normal heart sounds. No murmur heard.  No friction rub. No gallop.   Pulmonary:     Effort: Pulmonary effort is normal. No respiratory distress.     Breath sounds: Normal breath sounds. No wheezing, rhonchi or rales.  Chest:     Chest wall: No tenderness.  Abdominal:     General: Bowel sounds are normal. There is no distension.     Palpations: Abdomen is soft. There is no mass.     Tenderness: There is no abdominal tenderness. There is no right CVA tenderness, left CVA tenderness, guarding or rebound.  Musculoskeletal:     Right lower leg: Edema present.     Left lower leg: Edema present.     Comments: Unsteady gait walking with crutches.left leg edema greater than the right.Calf muscleTender to palpation.left  ankle warm and tender to palpation.   Neurological:     Mental Status: He is alert and oriented to person, place, and time.     Cranial Nerves: No cranial nerve deficit.     Motor: No weakness.     Comments: Unsteady gait due to left foot pain walking with  crutches   Psychiatric:        Mood and Affect: Mood normal.        Behavior: Behavior normal.        Thought Content: Thought content normal.        Judgment: Judgment normal.     Labs reviewed: Recent Labs    10/19/19 1540 11/28/19 1505  NA 141 140  K 3.6 3.7  CL 102 103  CO2 29 26  GLUCOSE 114* 114*  BUN 16 18  CREATININE 1.22* 1.55*  CALCIUM 9.2 9.1   Recent Labs    10/19/19 1540 11/28/19 1505  AST 21 31  ALT 11 19  ALKPHOS  --  77  BILITOT 1.4* 1.6*  PROT 7.6 7.5  ALBUMIN  --  4.5   Recent Labs    10/19/19 1540 11/28/19 1505  WBC 8.0 9.6  NEUTROABS 5,200  --   HGB 13.4 12.8*  HCT 39.8 39.7  MCV 92.3 96.1  PLT 174 167   No results found for: TSH Lab Results  Component Value Date   HGBA1C 6.4 (H) 10/19/2019   Lab Results  Component Value Date   CHOL 162 10/19/2019   HDL 41 10/19/2019   LDLCALC 103 (H) 10/19/2019   TRIG 89 10/19/2019   CHOLHDL 4.0 10/19/2019    Significant Diagnostic Results in last 30 days:  No results found.  Assessment/Plan 1. Pain and swelling of left lower leg Worsening left leg swelling.Calf muscle down to ankle tenderness to palpation.redness noted and warm to touch highly suspicious for deep vein thrombosis.Recommeded left leg ultrasound at the vein and vascular specialist.Vascular specialist called but unable to get Ultrasound done today.Recommend sending to ED for further evaluation. Discussed with patient and wife agreed to go to ED.     2. Elevated blood pressure reading B/p elevated today.Asymptomatic.suspect due to left leg pain.send to ED.   Family/ staff Communication: Reviewed plan of care with patient and wife verbalized understanding.Left to ED escorted  by wife in a stable condition.   Labs/tests ordered: send to ED.   Next Appointment: Has scheduled appointment with Dr.Reed 04/11/2020.  Sandrea Hughs, NP

## 2020-01-29 NOTE — Progress Notes (Signed)
Lower extremity venous has been completed.   Preliminary results in CV Proc.   Abram Sander 01/29/2020 5:33 PM

## 2020-01-29 NOTE — Telephone Encounter (Signed)
Returned call to Pt's wife.  Advised made appt to see Dr. Curt Bears on February 02, 2020 at 3:45 pm.  Advised wife to call Dr. Mariea Clonts for medication instructions until Pt can be seen by Dr. Curt Bears (last seen in 2019)  Pt's wife indicates understanding.

## 2020-01-29 NOTE — ED Triage Notes (Signed)
Pt sent here from [facility] to rule out a DVT since he presents with L foot/leg swelling and redness. Denies shob.

## 2020-02-02 ENCOUNTER — Ambulatory Visit: Payer: HMO | Admitting: Cardiology

## 2020-02-03 MED FILL — ELIQUIS 5 MG TABLET: 5 | 30 days supply | Qty: 60 | Fill #3

## 2020-03-08 ENCOUNTER — Other Ambulatory Visit: Payer: Self-pay | Admitting: Internal Medicine

## 2020-03-08 ENCOUNTER — Other Ambulatory Visit: Payer: Self-pay | Admitting: *Deleted

## 2020-03-08 MED ORDER — FUROSEMIDE 40 MG PO TABS: 40.0000 mg | ORAL_TABLET | Freq: Two times a day (BID) | ORAL | 5 refills | Status: DC

## 2020-03-08 MED ORDER — APIXABAN 5 MG PO TABS: ORAL_TABLET | ORAL | 5 refills | Status: DC

## 2020-03-08 MED FILL — ELIQUIS 5 MG TABLET: 5 | 15 days supply | Qty: 30 | Fill #0

## 2020-03-08 MED FILL — FUROSEMIDE 40 MG TAB: 40 | 30 days supply | Qty: 60 | Fill #0

## 2020-03-08 NOTE — Telephone Encounter (Signed)
[place] son requested refills.  Wife in [facility].

## 2020-04-08 ENCOUNTER — Other Ambulatory Visit: Payer: HMO

## 2020-04-11 ENCOUNTER — Ambulatory Visit: Payer: HMO | Admitting: Internal Medicine

## 2020-04-22 MED FILL — FUROSEMIDE 40 MG TAB: 40 | 30 days supply | Qty: 60 | Fill #1

## 2020-04-28 ENCOUNTER — Other Ambulatory Visit: Payer: HMO

## 2020-04-28 ENCOUNTER — Other Ambulatory Visit: Payer: Self-pay

## 2020-04-28 DIAGNOSIS — E1142 Type 2 diabetes mellitus with diabetic polyneuropathy: Secondary | ICD-10-CM | POA: Diagnosis not present

## 2020-04-28 DIAGNOSIS — I4819 Other persistent atrial fibrillation: Secondary | ICD-10-CM

## 2020-04-28 DIAGNOSIS — G894 Chronic pain syndrome: Secondary | ICD-10-CM

## 2020-04-28 DIAGNOSIS — E7800 Pure hypercholesterolemia, unspecified: Secondary | ICD-10-CM | POA: Diagnosis not present

## 2020-04-29 LAB — LIPID PANEL
Cholesterol: 124 mg/dL (ref <200)
HDL: 41 mg/dL (ref >=40)
LDL Cholesterol (Calc): 71 mg/dL (calc)
Non-HDL Cholesterol (Calc): 83 mg/dL (calc) (ref <130)
Total CHOL/HDL Ratio: 3 (calc) (ref <5.0)
Triglycerides: 42 mg/dL (ref <150)

## 2020-04-29 LAB — CBC WITH DIFFERENTIAL/PLATELET
Absolute Monocytes: 431 cells/uL (ref 200–950)
Basophils Absolute: 22 cells/uL (ref 0–200)
Basophils Relative: 0.4 %
Eosinophils Absolute: 112 cells/uL (ref 15–500)
Eosinophils Relative: 2 %
HCT: 33.7 % — ABNORMAL LOW (ref 38.5–50.0)
Hemoglobin: 11.3 g/dL — ABNORMAL LOW (ref 13.2–17.1)
Lymphs Abs: 1478 cells/uL (ref 850–3900)
MCH: 31.8 pg (ref 27.0–33.0)
MCHC: 33.5 g/dL (ref 32.0–36.0)
MCV: 94.9 fL (ref 80.0–100.0)
MPV: 12 fL (ref 7.5–12.5)
Monocytes Relative: 7.7 %
Neutro Abs: 3556 cells/uL (ref 1500–7800)
Neutrophils Relative %: 63.5 %
Platelets: 134 10*3/uL — ABNORMAL LOW (ref 140–400)
RBC: 3.55 10*6/uL — ABNORMAL LOW (ref 4.20–5.80)
RDW: 13.7 % (ref 11.0–15.0)
Total Lymphocyte: 26.4 %
WBC: 5.6 10*3/uL (ref 3.8–10.8)

## 2020-04-29 LAB — HEMOGLOBIN A1C
Hgb A1c MFr Bld: 6.3 % of total Hgb — ABNORMAL HIGH (ref <5.7)
Mean Plasma Glucose: 134 (calc)
eAG (mmol/L): 7.4 (calc)

## 2020-04-29 LAB — COMPLETE METABOLIC PANEL WITH GFR
AG Ratio: 1.9 (calc) (ref 1.0–2.5)
ALT: 14 U/L (ref 9–46)
AST: 23 U/L (ref 10–35)
Albumin: 4.1 g/dL (ref 3.6–5.1)
Alkaline phosphatase (APISO): 75 U/L (ref 35–144)
BUN/Creatinine Ratio: 17 (calc) (ref 6–22)
BUN: 20 mg/dL (ref 7–25)
CO2: 26 mmol/L (ref 20–32)
Calcium: 8.2 mg/dL — ABNORMAL LOW (ref 8.6–10.3)
Chloride: 103 mmol/L (ref 98–110)
Creat: 1.16 mg/dL — ABNORMAL HIGH (ref 0.70–1.11)
GFR, Est African American: 68 mL/min/{1.73_m2} (ref >=60)
GFR, Est Non African American: 58 mL/min/{1.73_m2} — ABNORMAL LOW (ref >=60)
Globulin: 2.2 g/dL (calc) (ref 1.9–3.7)
Glucose, Bld: 88 mg/dL (ref 65–99)
Potassium: 3.6 mmol/L (ref 3.5–5.3)
Sodium: 141 mmol/L (ref 135–146)
Total Bilirubin: 1.4 mg/dL — ABNORMAL HIGH (ref 0.2–1.2)
Total Protein: 6.3 g/dL (ref 6.1–8.1)

## 2020-04-29 NOTE — Progress Notes (Signed)
Sugar average has trended down some to 6.3 Bad cholesterol is also better  Kidney function has improved Calcium was a bit low He is more anemic and platelets are mildly low I suspect these changes are due to grief and changes in eating habits but we will discuss at his appt

## 2020-05-02 ENCOUNTER — Ambulatory Visit (INDEPENDENT_AMBULATORY_CARE_PROVIDER_SITE_OTHER): Payer: HMO | Admitting: Internal Medicine

## 2020-05-02 ENCOUNTER — Encounter: Payer: Self-pay | Admitting: Internal Medicine

## 2020-05-02 ENCOUNTER — Other Ambulatory Visit: Payer: Self-pay

## 2020-05-02 VITALS — BP 122/82 | HR 78 | Temp 97.5°F | Ht 70.0 in | Wt 194.2 lb

## 2020-05-02 DIAGNOSIS — I4891 Unspecified atrial fibrillation: Secondary | ICD-10-CM | POA: Diagnosis not present

## 2020-05-02 DIAGNOSIS — R2689 Other abnormalities of gait and mobility: Secondary | ICD-10-CM | POA: Diagnosis not present

## 2020-05-02 DIAGNOSIS — M4820 Kissing spine, site unspecified: Secondary | ICD-10-CM

## 2020-05-02 DIAGNOSIS — E1142 Type 2 diabetes mellitus with diabetic polyneuropathy: Secondary | ICD-10-CM | POA: Diagnosis not present

## 2020-05-02 DIAGNOSIS — G894 Chronic pain syndrome: Secondary | ICD-10-CM

## 2020-05-02 DIAGNOSIS — E7800 Pure hypercholesterolemia, unspecified: Secondary | ICD-10-CM | POA: Diagnosis not present

## 2020-05-02 DIAGNOSIS — D6869 Other thrombophilia: Secondary | ICD-10-CM | POA: Diagnosis not present

## 2020-05-02 DIAGNOSIS — I4819 Other persistent atrial fibrillation: Secondary | ICD-10-CM

## 2020-05-02 NOTE — Progress Notes (Signed)
Location:  [facility] clinic Provider:  Berlinda Farve L. Mariea Clonts, D.O., C.M.D.  Goals of Care:  Advanced Directives 05/02/2020  Does Patient Have a Medical Advance Directive? No  Would patient like information on creating a medical advance directive? Yes (ED - Information included in AVS)  We still don't have copies of any living will/HCPOA.  Need to address next time  Chief Complaint  Patient presents with  . Medical Management of Chronic Issues    4 month follow up   . Health Maintenance    Covid 19 will not get , foot exam, Influenza (wait)  . Acute Visit    Letter to be signed Opiod     HPI: Patient is a 84 y.o. male seen today for medical management of chronic diseases.    He has lost 8 lbs since his last appt.  His daughter, Precious Bard, is staying with him now since his wife, [name], passed away after a severe hemorrhagic stroke.  His nerves have been bad.  They had been married 81 yrs and he knew her 5 yrs before that.  Precious Bard helps with cooking and laundry.    He never did rest well--he's actually wanting to sleep more than before.  He can't get her off his mind.  For a while, he did not want anything to eat.  Precious Bard does not cook much--she gets takeout for them.    Last week, his balance was real bad.  He's not dizzy.  If he shuts his eyes in the shower, he'll just about fall over.  It's getting worse.  He had inner ear trouble years ago.  It can be like the room is spinning.  Worse if he turns.    He had an episode of cellulitis in July.  He did not have a blood clot.  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    His neuropathy is getting worse.  Foot exam done today.  He was put on hydroxychloroquine for his pain and it did help--didn't take his pain away all the way.    Past Medical History:  Diagnosis Date  . Anxiety state, unspecified   . Atrial fibrillation ([place]) 03/06/2016  . Chronic pain syndrome   . Constipation   . Depressive disorder, not elsewhere classified   .  Dysphagia, unspecified(787.20)   . Eye exam normal 09/01/2019   Diabetic eye exam did not reveal any retinopathy.   . Herpes simplex disease   . Hyperlipidemia   . Hypertension   . Hypopotassemia   . Insomnia, unspecified   . Intestinovesical fistula   . Malignant neoplasm of prostate ([place])   . Myalgia and myositis, unspecified   . Osteoarthritis   . Pain in joint, lower leg    Bilateral knee pains  . Reflux esophagitis   . Rheumatoid arthritis with rheumatoid factor (HCC)   . Spinal stenosis, unspecified region other than cervical   . Thrombocytopenia, unspecified ([place])   . Type II or unspecified type diabetes mellitus without mention of complication, not stated as uncontrolled    Pt states that it is prediabetic  . Unspecified arthropathy, pelvic region and thigh   . Unspecified hereditary and idiopathic peripheral neuropathy     Past Surgical History:  Procedure Laterality Date  . CARDIAC CATHETERIZATION  01/31/2004   Dr Gwenlyn Found  . CARDIOVERSION N/A 07/12/2016   Procedure: CARDIOVERSION;  Surgeon: Pixie Casino, MD;  Location: [facility];  Service: Cardiovascular;  Laterality: N/A;  . COLONOSCOPY  08/09/2006  Dr Christian Mate, hemorrhoids/rectal fistula  . cystocopy  07/2006   Dr Harlow Asa  . NASAL SINUS SURGERY  1992  . PROSTATE SURGERY  1991  . TOTAL HIP ARTHROPLASTY Right 02/20/2018   Procedure: RIGHT TOTAL HIP ARTHROPLASTY ANTERIOR APPROACH;  Surgeon: Mcarthur Rossetti, MD;  Location: [facility];  Service: Orthopedics;  Laterality: Right;    No Known Allergies  Outpatient Encounter Medications as of 05/02/2020  Medication Sig  . apixaban (ELIQUIS) 5 MG TABS tablet Take one tablet by mouth twice daily to help prevent stroke related to atrial fibrillation  . cromolyn (OPTICROM) 4 % ophthalmic solution Place 1 drop into both eyes 4 (four) times daily.  Marland Kitchen doxycycline (VIBRAMYCIN) 100 MG capsule Take 1 capsule (100 mg total) by mouth 2 (two) times daily.  . furosemide  (LASIX) 40 MG tablet Take 1 tablet (40 mg total) by mouth 2 (two) times daily.  . hydroxychloroquine (PLAQUENIL) 200 MG tablet Take 200 mg by mouth 2 (two) times daily.   Marland Kitchen morphine (MSIR) 15 MG tablet Take one tablet by mouth every 6 hours as needed for severe pain  . polyethylene glycol (MIRALAX / GLYCOLAX) 17 g packet Take 17 g by mouth daily.   No facility-administered encounter medications on file as of 05/02/2020.    Review of Systems:  Review of Systems  Constitutional: Positive for weight loss. Negative for chills, fever and malaise/fatigue.  HENT: Negative for congestion and sore throat.   Eyes: Negative for blurred vision.  Respiratory: Negative for cough and shortness of breath.   Cardiovascular: Positive for leg swelling. Negative for chest pain, palpitations, orthopnea and PND.  Gastrointestinal: Negative for abdominal pain and constipation.  Genitourinary: Negative for dysuria.  Musculoskeletal: Positive for back pain and joint pain. Negative for falls.  Skin: Negative for itching and rash.  Neurological: Positive for sensory change. Negative for dizziness and loss of consciousness.       Balance problem  Psychiatric/Behavioral: Positive for memory loss. The patient is nervous/anxious and has insomnia.        Grieving loss of his wife    Health Maintenance  Topic Date Due  . FOOT EXAM  12/22/2015  . INFLUENZA VACCINE  02/21/2020  . COVID-19 Vaccine (1) 05/18/2020 (Originally 08/02/1947)  . TETANUS/TDAP  11/01/2024 (Originally 08/02/1954)  . OPHTHALMOLOGY EXAM  08/31/2020  . URINE MICROALBUMIN  10/18/2020  . HEMOGLOBIN A1C  10/27/2020  . PNA vac Low Risk Adult  Completed    Physical Exam: Vitals:   05/02/20 1051  BP: 122/82  Pulse: 78  Temp: (!) 97.5 F (36.4 C)  SpO2: 98%  Weight: 194 lb 3.2 oz (88.1 kg)   Body mass index is 27.86 kg/m. Physical Exam Vitals reviewed.  Constitutional:      General: He is not in acute distress.    Appearance: Normal  appearance. He is not toxic-appearing.  HENT:     Head: Normocephalic and atraumatic.     Right Ear: Tympanic membrane, ear canal and external ear normal. There is no impacted cerumen.     Left Ear: Tympanic membrane, ear canal and external ear normal. There is no impacted cerumen.     Ears:     Comments: HOH, wearing his hearing aid today Eyes:     Extraocular Movements: Extraocular movements intact.     Pupils: Pupils are equal, round, and reactive to light.     Comments: No nystagmus  Cardiovascular:     Rate and Rhythm: Rhythm irregular.  Heart sounds: No murmur heard.   Pulmonary:     Effort: Pulmonary effort is normal.     Breath sounds: Normal breath sounds. No wheezing, rhonchi or rales.  Abdominal:     General: Bowel sounds are normal.  Musculoskeletal:        General: Normal range of motion.     Cervical back: Neck supple.     Right lower leg: Edema present.     Left lower leg: Edema present.  Skin:    General: Skin is warm and dry.  Neurological:     Mental Status: He is alert and oriented to person, place, and time.     Gait: Gait abnormal.     Comments: Off balance ambulating, not lightheaded on standing, no nystagmus; Diabetic foot exam was performed with the following findings:   No deformities, ulcerations, or other skin breakdown Intact posterior tibialis and dorsalis pedis pulses Left foot sensation to monofilament only in middle three toes Right foot sensation intact to monofilament throughout Mild fungus of nails, edema of lower legs, left greater than right  chronically     Psychiatric:        Mood and Affect: Mood normal.     Labs reviewed: Basic Metabolic Panel: Recent Labs    10/19/19 1540 11/28/19 1505 04/28/20 1431  NA 141 140 141  K 3.6 3.7 3.6  CL 102 103 103  CO2 29 26 26   GLUCOSE 114* 114* 88  BUN 16 18 20   CREATININE 1.22* 1.55* 1.16*  CALCIUM 9.2 9.1 8.2*   Liver Function Tests: Recent Labs    10/19/19 1540  11/28/19 1505 04/28/20 1431  AST 21 31 23   ALT 11 19 14   ALKPHOS  --  77  --   BILITOT 1.4* 1.6* 1.4*  PROT 7.6 7.5 6.3  ALBUMIN  --  4.5  --    Recent Labs    11/28/19 1505  LIPASE 30   No results for input(s): AMMONIA in the last 8760 hours. CBC: Recent Labs    10/19/19 1540 11/28/19 1505 04/28/20 1431  WBC 8.0 9.6 5.6  NEUTROABS 5,200  --  3,556  HGB 13.4 12.8* 11.3*  HCT 39.8 39.7 33.7*  MCV 92.3 96.1 94.9  PLT 174 167 134*   Lipid Panel: Recent Labs    10/19/19 1540 04/28/20 1431  CHOL 162 124  HDL 41 41  LDLCALC 103* 71  TRIG 89 42  CHOLHDL 4.0 3.0   Lab Results  Component Value Date   HGBA1C 6.3 (H) 04/28/2020   Assessment/Plan 1. Balance problem -new complaint, seems to be due to his neuropathy and sensory issues--poor hearing, notes it's worst if he closes his eyes -recommended home health PT, OT but he declines--says he's too anxious and has to much to do after his wife's passing  -Precious Bard is going to try to convince him b/c he's also having right shoulder pain that they mentioned going out the door  2. Chronic pain syndrome -remains, was prescribed plaquenil by rheum, appears he has not returned and is no longer using it though he says it did help him -needs to f/u with rheum as recommended  3. DM type 2 with diabetic peripheral neuropathy (HCC) -balance already poor so addition of gabapentin for symptom will only worsen this -glucose control improved with weight loss from poor intake around wife's death/grieving -his daughter is not as much of a cook--she mostly gets takeout--provided Scientist, clinical (histocompatibility and immunogenetics) about healthier choices for his diabetes  4. Localized  idiopathic skeletal hyperostosis -contributes to his chronic pain -he was virtually immobile w/o morphine so continued -contract was filled in today but we did not have him sign it before he left so need to do at future visit or lab appt--he's not using it regularly b/c he got 120 tablets  last in April  5. Persistent atrial fibrillation (HCC) -cont eliquis therapy for hypercoagulable state due to afib, no issues with rate control and not on meds for that  6. 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  STARTS TALKING ABOUT TRUMP AND SUPPORTING HIM, ETC.    Had flu shot late last season and wants to wait until next visit to get it this year.  Labs/tests ordered:   Lab Orders     Hemoglobin A1c     Lipid panel     BASIC METABOLIC PANEL WITH GFR  Next appt:  09/08/2020 med mgt, fasting labs before  Sharde Gover L. Claudetta Sallie, D.O. [facility] [address],  [address] Cell Phone (Mon-Fri 8am-5pm):  [phone] On Call:  [phone] & follow prompts after 5pm & weekends Office Phone:  [phone] Office Fax:  [phone]

## 2020-05-02 NOTE — Patient Instructions (Addendum)
I recommend you have some physical therapy for your balance.  They could come to your house 2-3 times a week for just an hour or so each time.  They can teach you exercises to do.    Next time, we'll give you a flu shot.  Diabetes Mellitus and Nutrition, Adult When you have diabetes (diabetes mellitus), it is very important to have healthy eating habits because your blood sugar (glucose) levels are greatly affected by what you eat and drink. Eating healthy foods in the appropriate amounts, at about the same times every day, can help you:  Control your blood glucose.  Lower your risk of heart disease.  Improve your blood pressure.  Reach or maintain a healthy weight. Every person with diabetes is different, and each person has different needs for a meal plan. Your health care provider may recommend that you work with a diet and nutrition specialist (dietitian) to make a meal plan that is best for you. Your meal plan may vary depending on factors such as:  The calories you need.  The medicines you take.  Your weight.  Your blood glucose, blood pressure, and cholesterol levels.  Your activity level.  Other health conditions you have, such as heart or kidney disease. How do carbohydrates affect me? Carbohydrates, also called carbs, affect your blood glucose level more than any other type of food. Eating carbs naturally raises the amount of glucose in your blood. Carb counting is a method for keeping track of how many carbs you eat. Counting carbs is important to keep your blood glucose at a healthy level, especially if you use insulin or take certain oral diabetes medicines. It is important to know how many carbs you can safely have in each meal. This is different for every person. Your dietitian can help you calculate how many carbs you should have at each meal and for each snack. Foods that contain carbs include:  Bread, cereal, rice, pasta, and crackers.  Potatoes and corn.  Peas,  beans, and lentils.  Milk and yogurt.  Fruit and juice.  Desserts, such as cakes, cookies, ice cream, and candy. How does alcohol affect me? Alcohol can cause a sudden decrease in blood glucose (hypoglycemia), especially if you use insulin or take certain oral diabetes medicines. Hypoglycemia can be a life-threatening condition. Symptoms of hypoglycemia (sleepiness, dizziness, and confusion) are similar to symptoms of having too much alcohol. If your health care provider says that alcohol is safe for you, follow these guidelines:  Limit alcohol intake to no more than 1 drink per day for nonpregnant women and 2 drinks per day for men. One drink equals 12 oz of beer, 5 oz of wine, or 1 oz of hard liquor.  Do not drink on an empty stomach.  Keep yourself hydrated with water, diet soda, or unsweetened iced tea.  Keep in mind that regular soda, juice, and other mixers may contain a lot of sugar and must be counted as carbs. What are tips for following this plan?  Reading food labels  Start by checking the serving size on the "Nutrition Facts" label of packaged foods and drinks. The amount of calories, carbs, fats, and other nutrients listed on the label is based on one serving of the item. Many items contain more than one serving per package.  Check the total grams (g) of carbs in one serving. You can calculate the number of servings of carbs in one serving by dividing the total carbs by 15. For example,  if a food has 30 g of total carbs, it would be equal to 2 servings of carbs.  Check the number of grams (g) of saturated and trans fats in one serving. Choose foods that have low or no amount of these fats.  Check the number of milligrams (mg) of salt (sodium) in one serving. Most people should limit total sodium intake to less than 2,300 mg per day.  Always check the nutrition information of foods labeled as "low-fat" or "nonfat". These foods may be higher in added sugar or refined carbs  and should be avoided.  Talk to your dietitian to identify your daily goals for nutrients listed on the label. Shopping  Avoid buying canned, premade, or processed foods. These foods tend to be high in fat, sodium, and added sugar.  Shop around the outside edge of the grocery store. This includes fresh fruits and vegetables, bulk grains, fresh meats, and fresh dairy. Cooking  Use low-heat cooking methods, such as baking, instead of high-heat cooking methods like deep frying.  Cook using healthy oils, such as olive, canola, or sunflower oil.  Avoid cooking with butter, cream, or high-fat meats. Meal planning  Eat meals and snacks regularly, preferably at the same times every day. Avoid going long periods of time without eating.  Eat foods high in fiber, such as fresh fruits, vegetables, beans, and whole grains. Talk to your dietitian about how many servings of carbs you can eat at each meal.  Eat 4-6 ounces (oz) of lean protein each day, such as lean meat, chicken, fish, eggs, or tofu. One oz of lean protein is equal to: ? 1 oz of meat, chicken, or fish. ? 1 egg. ?  cup of tofu.  Eat some foods each day that contain healthy fats, such as avocado, nuts, seeds, and fish. Lifestyle  Check your blood glucose regularly.  Exercise regularly as told by your health care provider. This may include: ? 150 minutes of moderate-intensity or vigorous-intensity exercise each week. This could be brisk walking, biking, or water aerobics. ? Stretching and doing strength exercises, such as yoga or weightlifting, at least 2 times a week.  Take medicines as told by your health care provider.  Do not use any products that contain nicotine or tobacco, such as cigarettes and e-cigarettes. If you need help quitting, ask your health care provider.  Work with a Social worker or diabetes educator to identify strategies to manage stress and any emotional and social challenges. Questions to ask a health care  provider  Do I need to meet with a diabetes educator?  Do I need to meet with a dietitian?  What number can I call if I have questions?  When are the best times to check my blood glucose? Where to find more information:  American Diabetes Association: diabetes.org  Academy of Nutrition and Dietetics: www.eatright.[employer] of Diabetes and Digestive and Kidney Diseases (NIH): [employer] Summary  A healthy meal plan will help you control your blood glucose and maintain a healthy lifestyle.  Working with a diet and nutrition specialist (dietitian) can help you make a meal plan that is best for you.  Keep in mind that carbohydrates (carbs) and alcohol have immediate effects on your blood glucose levels. It is important to count carbs and to use alcohol carefully. This information is not intended to replace advice given to you by your health care provider. Make sure you discuss any questions you have with your health care provider. Document Revised: 06/21/2017  Document Reviewed: 08/13/2016 Elsevier Patient Education  [employer].

## 2020-05-23 MED FILL — FUROSEMIDE 40 MG TAB: 40 | 30 days supply | Qty: 60 | Fill #2

## 2020-05-30 ENCOUNTER — Other Ambulatory Visit: Payer: Self-pay | Admitting: *Deleted

## 2020-05-30 NOTE — Patient Outreach (Addendum)
  [facility] [facility]) Care Management Chronic Special Needs Program    05/30/2020  Name: Ronnie Jackson, DOB: Aug 30, 1935  MRN: [id number]   Mr. Harveer Sadler is enrolled in a chronic special needs plan for Diabetes.  [facility] care management will continue to provide services for this member through 07/22/20.  The [facility] care management team will assume care 07/23/20.   Jacqlyn Larsen [facility], BSN [place], [place]

## 2020-06-08 ENCOUNTER — Other Ambulatory Visit: Payer: Self-pay | Admitting: *Deleted

## 2020-06-08 NOTE — Patient Outreach (Signed)
  [place] [facility]) Care Management Chronic Special Needs Program    06/08/2020  Name: Ronnie Jackson, DOB: 11/01/1935  MRN: [id number]   [name] is enrolled in a chronic special needs plan for Diabetes.  [facility] care management team has assumed care and services for this member.  Case closed by [facility] care management.  Jacqlyn Larsen [facility], BSN [place], [place]

## 2020-06-22 MED FILL — FUROSEMIDE 40 MG TAB: 40 | 30 days supply | Qty: 60 | Fill #3

## 2020-07-22 MED FILL — FUROSEMIDE 40 MG TAB: 40 | 30 days supply | Qty: 60 | Fill #4

## 2020-07-27 ENCOUNTER — Ambulatory Visit: Payer: HMO | Admitting: *Deleted

## 2020-08-03 ENCOUNTER — Other Ambulatory Visit: Payer: Self-pay | Admitting: Internal Medicine

## 2020-08-03 DIAGNOSIS — K632 Fistula of intestine: Secondary | ICD-10-CM

## 2020-08-04 ENCOUNTER — Other Ambulatory Visit: Payer: Self-pay | Admitting: Internal Medicine

## 2020-08-04 ENCOUNTER — Telehealth: Payer: Self-pay | Admitting: *Deleted

## 2020-08-04 DIAGNOSIS — K631 Perforation of intestine (nontraumatic): Secondary | ICD-10-CM

## 2020-08-04 MED ORDER — SULFAMETHOXAZOLE-TRIMETHOPRIM 800-160 MG PO TABS: 1.0000 | ORAL_TABLET | Freq: Two times a day (BID) | ORAL | 3 refills | Status: DC

## 2020-08-04 MED FILL — SULFAMETHOXAZOLE-TMP DS TAB: 800-160 | 30 days supply | Qty: 60 | Fill #0

## 2020-08-04 NOTE — Telephone Encounter (Signed)
Medication list updated and Rx pended and sent to Dr. Mariea Clonts for approval.

## 2020-08-04 NOTE — Telephone Encounter (Signed)
Patient grandson, Octavia Bruckner, called and stated that patient needs a refill on his Bactrim DS 800/160 one twice daily.  Stated that patient has been taking this for awhile and it was refused yesterday. He is not sure why.  Is this ok to add back and refill.  Please Advise.

## 2020-08-04 NOTE — Telephone Encounter (Signed)
Ok to renew bactrim.  I've tried for almost 10 years to explain that I don't think he needs it anymore and he's never accepted my explanation or agreed to reimaging of his abdomen to determine if it was necessary.

## 2020-08-08 ENCOUNTER — Other Ambulatory Visit: Payer: Self-pay | Admitting: Internal Medicine

## 2020-08-08 ENCOUNTER — Telehealth: Payer: Self-pay

## 2020-08-08 ENCOUNTER — Encounter: Payer: Self-pay | Admitting: Internal Medicine

## 2020-08-08 ENCOUNTER — Other Ambulatory Visit: Payer: Self-pay

## 2020-08-08 ENCOUNTER — Telehealth (INDEPENDENT_AMBULATORY_CARE_PROVIDER_SITE_OTHER): Payer: HMO | Admitting: Internal Medicine

## 2020-08-08 DIAGNOSIS — L03119 Cellulitis of unspecified part of limb: Secondary | ICD-10-CM

## 2020-08-08 MED ORDER — DOXYCYCLINE HYCLATE 100 MG PO CAPS: 100.0000 mg | ORAL_CAPSULE | Freq: Two times a day (BID) | ORAL | 0 refills | Status: DC

## 2020-08-08 MED FILL — DOXYCYCLINE HYCLATE 100 MG: 100 | 10 days supply | Qty: 20 | Fill #0

## 2020-08-08 NOTE — Progress Notes (Signed)
     This service is provided via telemedicine  No vital signs collected/recorded due to the encounter was a telemedicine visit.   Location of patient (ex: home, work): Home.  Patient consents to a telephone visit: Yes.  Location of the provider (ex: office, home): Remote.  Name of any referring provider: Gayland Curry, DO   Names of all persons participating in the telemedicine service and their role in the encounter: Patient, Yonael Tulloch, Yolanda Bonine, Heriberto [place], [place], Hollace Kinnier, DO.    Time spent on call: 8 minutes spent on the phone with Medical Assistant.    Virtual Visit via Video Note  I connected with Earnestine Leys on 08/08/20 at  2:45 PM EST by a video enabled telemedicine application and verified that I am speaking with the correct person using two identifiers.  Location: Patient: Home Provider: My home   I discussed the limitations of evaluation and management by telemedicine and the availability of in person appointments. The patient expressed understanding and agreed to proceed.  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  This call is due to increased edema in his legs.  His grandson is with him.  When he went to see him, his edema was really severe and pitting.   Pt reports that 4 days ago, amid the time he was out of his bactrim, this started and his legs became red, swollen and painful.  He has been taking his lasix as directed.  He ate some foods that upset his bowels the other day that he shouldn't eat.    He is very HOH and [name] helps with communication.  Observations/Objective: Alert, pleasant, HOH 3+ pitting edema as demonstrated by his grandson with tenderness, more purple discoloration than erythema  Assessment and Plan: 1. Cellulitis of lower extremity, unspecified laterality - appears he has cellulitis again and perhaps his  bactrim normally keeps it at [place] - will tx the same way as last time -elevate feet at rest - doxycycline (VIBRAMYCIN) 100 MG capsule; Take 1 capsule (100 mg total) by mouth 2 (two) times daily.  Dispense: 20 capsule; Refill: 0 -eat yogurt daily on this especially in view of two abx now -continue routine bid lasix   Follow Up Instructions: Keep feb appt and call back if not better next week   I discussed the assessment and treatment plan with the patient. The patient was provided an opportunity to ask questions and all were answered. The patient agreed with the plan and demonstrated an understanding of the instructions.   The patient was advised to call back or seek an in-person evaluation if the symptoms worsen or if the condition fails to improve as anticipated.  I provided 20 minutes of non-face-to-face time during this encounter.   Hollace Kinnier, DO

## 2020-08-08 NOTE — Telephone Encounter (Signed)
Mr. tajah, schreiner are scheduled for a virtual visit with your provider today.    Just as we do with appointments in the office, we must obtain your consent to participate.  Your consent will be active for this visit and any virtual visit you may have with one of our providers in the next 365 days.    If you have a MyChart account, I can also send a copy of this consent to you electronically.  All virtual visits are billed to your insurance company just like a traditional visit in the office.  As this is a virtual visit, video technology does not allow for your provider to perform a traditional examination.  This may limit your provider's ability to fully assess your condition.  If your provider identifies any concerns that need to be evaluated in person or the need to arrange testing such as labs, EKG, etc, we will make arrangements to do so.    Although advances in technology are sophisticated, we cannot ensure that it will always work on either your end or our end.  If the connection with a video visit is poor, we may have to switch to a telephone visit.  With either a video or telephone visit, we are not always able to ensure that we have a secure connection.   I need to obtain your verbal consent now.   Are you willing to proceed with your visit today?   EUGUNE SINE has provided verbal consent on 08/08/2020 for a virtual visit (video or telephone).   [place], [place] 08/08/2020  2:45 PM

## 2020-08-10 ENCOUNTER — Other Ambulatory Visit: Payer: Self-pay | Admitting: *Deleted

## 2020-08-10 ENCOUNTER — Other Ambulatory Visit: Payer: Self-pay | Admitting: Internal Medicine

## 2020-08-10 MED ORDER — APIXABAN 5 MG PO TABS: ORAL_TABLET | ORAL | 5 refills | Status: DC

## 2020-08-10 MED FILL — ELIQUIS 5 MG TABLET: 5 | 30 days supply | Qty: 60 | Fill #0

## 2020-08-10 NOTE — Telephone Encounter (Signed)
Gwen Her requested refill.

## 2020-08-22 MED FILL — FUROSEMIDE 40 MG TAB: 40 | 30 days supply | Qty: 60 | Fill #5

## 2020-09-05 ENCOUNTER — Other Ambulatory Visit: Payer: Self-pay

## 2020-09-05 ENCOUNTER — Other Ambulatory Visit: Payer: HMO

## 2020-09-05 DIAGNOSIS — I4891 Unspecified atrial fibrillation: Secondary | ICD-10-CM | POA: Diagnosis not present

## 2020-09-05 DIAGNOSIS — E7800 Pure hypercholesterolemia, unspecified: Secondary | ICD-10-CM

## 2020-09-05 DIAGNOSIS — E1142 Type 2 diabetes mellitus with diabetic polyneuropathy: Secondary | ICD-10-CM

## 2020-09-05 DIAGNOSIS — D6869 Other thrombophilia: Secondary | ICD-10-CM | POA: Diagnosis not present

## 2020-09-06 LAB — LIPID PANEL
Cholesterol: 139 mg/dL (ref <200)
HDL: 45 mg/dL (ref >=40)
LDL Cholesterol (Calc): 81 mg/dL (calc)
Non-HDL Cholesterol (Calc): 94 mg/dL (calc) (ref <130)
Total CHOL/HDL Ratio: 3.1 (calc) (ref <5.0)
Triglycerides: 58 mg/dL (ref <150)

## 2020-09-06 LAB — BASIC METABOLIC PANEL WITH GFR
BUN/Creatinine Ratio: 10 (calc) (ref 6–22)
BUN: 14 mg/dL (ref 7–25)
CO2: 28 mmol/L (ref 20–32)
Calcium: 8.7 mg/dL (ref 8.6–10.3)
Chloride: 103 mmol/L (ref 98–110)
Creat: 1.34 mg/dL — ABNORMAL HIGH (ref 0.70–1.11)
GFR, Est African American: 56 mL/min/{1.73_m2} — ABNORMAL LOW (ref >=60)
GFR, Est Non African American: 49 mL/min/{1.73_m2} — ABNORMAL LOW (ref >=60)
Glucose, Bld: 91 mg/dL (ref 65–99)
Potassium: 3.1 mmol/L — ABNORMAL LOW (ref 3.5–5.3)
Sodium: 142 mmol/L (ref 135–146)

## 2020-09-06 LAB — HEMOGLOBIN A1C
Hgb A1c MFr Bld: 6.4 % of total Hgb — ABNORMAL HIGH (ref <5.7)
Mean Plasma Glucose: 137 mg/dL
eAG (mmol/L): 7.6 mmol/L

## 2020-09-06 NOTE — Progress Notes (Signed)
Kidney function has declined some.  It's crucial that he's hydrating as we discussed on the virtual visit we had.  Potassium was low likely from the diuretic water pill (lasix) which was ordered without a potassium supplement.  Let's add potassium 44meq daily to his regimen to replace the potassium he is peeing out. Bad cholesterol is above goal (LDL should be under 70)--we'll discuss at his appt Sugar average continues to trend up toward diabetes range so we'll discuss it too

## 2020-09-08 ENCOUNTER — Encounter: Payer: Self-pay | Admitting: Internal Medicine

## 2020-09-08 ENCOUNTER — Ambulatory Visit (INDEPENDENT_AMBULATORY_CARE_PROVIDER_SITE_OTHER): Payer: HMO | Admitting: Internal Medicine

## 2020-09-08 ENCOUNTER — Other Ambulatory Visit: Payer: Self-pay

## 2020-09-08 ENCOUNTER — Other Ambulatory Visit: Payer: Self-pay | Admitting: Internal Medicine

## 2020-09-08 VITALS — BP 132/72 | HR 71 | Temp 97.5°F | Ht 70.0 in | Wt 178.1 lb

## 2020-09-08 DIAGNOSIS — E7800 Pure hypercholesterolemia, unspecified: Secondary | ICD-10-CM | POA: Diagnosis not present

## 2020-09-08 DIAGNOSIS — L03119 Cellulitis of unspecified part of limb: Secondary | ICD-10-CM | POA: Diagnosis not present

## 2020-09-08 DIAGNOSIS — M159 Polyosteoarthritis, unspecified: Secondary | ICD-10-CM | POA: Diagnosis not present

## 2020-09-08 DIAGNOSIS — I4819 Other persistent atrial fibrillation: Secondary | ICD-10-CM | POA: Diagnosis not present

## 2020-09-08 DIAGNOSIS — E1142 Type 2 diabetes mellitus with diabetic polyneuropathy: Secondary | ICD-10-CM

## 2020-09-08 DIAGNOSIS — K631 Perforation of intestine (nontraumatic): Secondary | ICD-10-CM | POA: Diagnosis not present

## 2020-09-08 MED ORDER — POTASSIUM CHLORIDE CRYS ER 20 MEQ PO TBCR: 40.0000 meq | EXTENDED_RELEASE_TABLET | Freq: Every day | ORAL | 3 refills | Status: DC

## 2020-09-08 MED FILL — POTASSIUM CHLORIDE CRYS ER: 20 | 30 days supply | Qty: 60 | Fill #0

## 2020-09-08 NOTE — Progress Notes (Signed)
Location:  [facility] clinic Provider:  Gwenetta Devos L. Mariea Clonts, D.O., C.M.D.  Code Status: need to discuss--he usually does not allow me to initiate much conversation with his hearing loss  Goals of Care:  Advanced Directives 09/08/2020  Does Patient Have a Medical Advance Directive? No  Would patient like information on creating a medical advance directive? No - Patient declined   Chief Complaint  Patient presents with  . Medical Management of Chronic Issues    4 month follow up   . Health Maintenance    Discuss Covid 19, eye exam, urine microalbumin and Influenza     HPI: Patient is a 85 y.o. male seen today for medical management of chronic diseases.    Swelling has improved.  cellulitis resolved.    He's not eating too well.  After [name] died, he didn't want to eat for 2 wks.  He's back eating some.  His daughter has started to fix him a little more food.  He wanted fruit but he's not eating it.  She admits not to being a good cook.      Sugar average 6.4.  He's been eating a lot of peach yogurt.  He likes it.    He's using metamucil each night which helps more than miralax now that he's drinking more water.    He does occasionally take the hydroxychloroquine the rheumatologist gave him for inflammation in his hands.   He also has topical otc medication for pain  He's been off the morphine fortunately due to constipation.  Discussed use of tylenol for some pain.  Past Medical History:  Diagnosis Date  . Anxiety state, unspecified   . Atrial fibrillation ([place]) 03/06/2016  . Chronic pain syndrome   . Constipation   . Depressive disorder, not elsewhere classified   . Dysphagia, unspecified(787.20)   . Eye exam normal 09/01/2019   Diabetic eye exam did not reveal any retinopathy.   . Herpes simplex disease   . Hyperlipidemia   . Hypertension   . Hypopotassemia   . Insomnia, unspecified   . Intestinovesical fistula   . Malignant neoplasm of prostate ([place])   . Myalgia and myositis,  unspecified   . Osteoarthritis   . Pain in joint, lower leg    Bilateral knee pains  . Reflux esophagitis   . Rheumatoid arthritis with rheumatoid factor (HCC)   . Spinal stenosis, unspecified region other than cervical   . Thrombocytopenia, unspecified ([place])   . Type II or unspecified type diabetes mellitus without mention of complication, not stated as uncontrolled    Pt states that it is prediabetic  . Unspecified arthropathy, pelvic region and thigh   . Unspecified hereditary and idiopathic peripheral neuropathy     Past Surgical History:  Procedure Laterality Date  . CARDIAC CATHETERIZATION  01/31/2004   Dr Gwenlyn Found  . CARDIOVERSION N/A 07/12/2016   Procedure: CARDIOVERSION;  Surgeon: Pixie Casino, MD;  Location: [facility];  Service: Cardiovascular;  Laterality: N/A;  . COLONOSCOPY  08/09/2006   Dr Christian Mate, hemorrhoids/rectal fistula  . cystocopy  07/2006   Dr Harlow Asa  . NASAL SINUS SURGERY  1992  . PROSTATE SURGERY  1991  . TOTAL HIP ARTHROPLASTY Right 02/20/2018   Procedure: RIGHT TOTAL HIP ARTHROPLASTY ANTERIOR APPROACH;  Surgeon: Mcarthur Rossetti, MD;  Location: [facility];  Service: Orthopedics;  Laterality: Right;    No Known Allergies  Outpatient Encounter Medications as of 09/08/2020  Medication Sig  . apixaban (ELIQUIS) 5 MG TABS tablet Take  one tablet by mouth twice daily to help prevent stroke related to atrial fibrillation  . cromolyn (OPTICROM) 4 % ophthalmic solution Place 1 drop into both eyes 4 (four) times daily.  . furosemide (LASIX) 40 MG tablet Take 1 tablet (40 mg total) by mouth 2 (two) times daily.  . hydroxychloroquine (PLAQUENIL) 200 MG tablet Take 200 mg by mouth 2 (two) times daily.   . polyethylene glycol (MIRALAX / GLYCOLAX) 17 g packet Take 17 g by mouth daily.  Marland Kitchen sulfamethoxazole-trimethoprim (BACTRIM DS) 800-160 MG tablet Take 1 tablet by mouth 2 (two) times daily.  Marland Kitchen doxycycline (VIBRAMYCIN) 100 MG capsule Take 1 capsule (100 mg  total) by mouth 2 (two) times daily.  . [DISCONTINUED] morphine (MSIR) 15 MG tablet Take one tablet by mouth every 6 hours as needed for severe pain   No facility-administered encounter medications on file as of 09/08/2020.    Review of Systems:  Review of Systems  Constitutional: Negative for chills and fever.  HENT: Positive for hearing loss. Negative for congestion and sore throat.        Has a hearing aid  Eyes: Negative for blurred vision.  Respiratory: Negative for cough and shortness of breath.   Cardiovascular: Negative for chest pain and palpitations.       Has intermittent swelling, uses lasix, but recently had bilateral cellulitis  Gastrointestinal: Negative for abdominal pain, blood in stool, constipation and melena.  Genitourinary: Negative for dysuria.  Musculoskeletal: Positive for back pain and joint pain. Negative for falls.  Skin: Negative for itching and rash.       Dry skin  Neurological: Negative for dizziness and loss of consciousness.  Endo/Heme/Allergies: Bruises/bleeds easily.  Psychiatric/Behavioral: The patient is nervous/anxious.        Grieving loss of wife, but improving gradually    Health Maintenance  Topic Date Due  . INFLUENZA VACCINE  02/21/2020  . OPHTHALMOLOGY EXAM  08/31/2020  . URINE MICROALBUMIN  10/18/2020  . COVID-19 Vaccine (1) 09/24/2020 (Originally 08/02/1940)  . TETANUS/TDAP  11/01/2024 (Originally 08/02/1954)  . HEMOGLOBIN A1C  03/05/2021  . FOOT EXAM  05/02/2021  . PNA vac Low Risk Adult  Completed    Physical Exam: Vitals:   09/08/20 1424  BP: 132/72  Pulse: 71  Temp: (!) 97.5 F (36.4 C)  TempSrc: Temporal  SpO2: 98%  Weight: 178 lb 1.6 oz (80.8 kg)  Height: 5\' 10"  (1.778 m)   Body mass index is 25.55 kg/m. Physical Exam Vitals reviewed.  Constitutional:      General: He is not in acute distress.    Appearance: Normal appearance. He is not toxic-appearing.     Comments: Notable weight loss especially in upper  chest/shoulders  HENT:     Head: Normocephalic and atraumatic.     Ears:     Comments: Right hearing aid    Nose: No congestion.  Eyes:     Conjunctiva/sclera: Conjunctivae normal.     Pupils: Pupils are equal, round, and reactive to light.  Cardiovascular:     Rate and Rhythm: Rhythm irregular.     Heart sounds: No murmur heard.   Pulmonary:     Effort: Pulmonary effort is normal.     Breath sounds: Normal breath sounds. No wheezing, rhonchi or rales.  Abdominal:     General: Bowel sounds are normal. There is no distension.     Palpations: Abdomen is soft.     Tenderness: There is no abdominal tenderness.  Musculoskeletal:  General: Normal range of motion.     Cervical back: Neck supple.     Right lower leg: No edema.     Left lower leg: Edema present.     Comments: Left leg chronically swollen vs right, varicose veins of both, fungal thick nails  Lymphadenopathy:     Cervical: No cervical adenopathy.  Skin:    Comments: No erythema, warmth now  Neurological:     General: No focal deficit present.     Mental Status: He is alert and oriented to person, place, and time.     Motor: No weakness.     Gait: Gait normal.  Psychiatric:        Mood and Affect: Mood normal.     Labs reviewed: Basic Metabolic Panel: Recent Labs    11/28/19 1505 04/28/20 1431 09/05/20 0939  NA 140 141 142  K 3.7 3.6 3.1*  CL 103 103 103  CO2 26 26 28   GLUCOSE 114* 88 91  BUN 18 20 14   CREATININE 1.55* 1.16* 1.34*  CALCIUM 9.1 8.2* 8.7   Liver Function Tests: Recent Labs    10/19/19 1540 11/28/19 1505 04/28/20 1431  AST 21 31 23   ALT 11 19 14   ALKPHOS  --  77  --   BILITOT 1.4* 1.6* 1.4*  PROT 7.6 7.5 6.3  ALBUMIN  --  4.5  --    Recent Labs    11/28/19 1505  LIPASE 30   No results for input(s): AMMONIA in the last 8760 hours. CBC: Recent Labs    10/19/19 1540 11/28/19 1505 04/28/20 1431  WBC 8.0 9.6 5.6  NEUTROABS 5,200  --  3,556  HGB 13.4 12.8* 11.3*   HCT 39.8 39.7 33.7*  MCV 92.3 96.1 94.9  PLT 174 167 134*   Lipid Panel: Recent Labs    10/19/19 1540 04/28/20 1431 09/05/20 0939  CHOL 162 124 139  HDL 41 41 45  LDLCALC 103* 71 81  TRIG 89 42 58  CHOLHDL 4.0 3.0 3.1   Lab Results  Component Value Date   HGBA1C 6.4 (H) 09/05/2020    Assessment/Plan 1. Cellulitis of lower extremity, unspecified laterality - resolved, but has had two episodes now - he gets as needed bactrim that his prior pcp gave him in case he had leaky gut issues related to #2 when reportedly surgeon put his thumb through his bowel (?) and now wanted to keep doxy on hand in case of recurrent cellulitis -advised to try the bactrim if he gets redness, warmth, swelling of legs and if not improving after taking it, call for appt with NP  - CBC with Differential/Platelet; Future  2. 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 DM type 2 with diabetic peripheral neuropathy (HCC) - control slightly worse, but reasonable with his advanced age and recent weight loss - Hemoglobin A1c; Future - COMPLETE METABOLIC PANEL WITH GFR; Future  4. Pure hypercholesterolemia -cholesterol trended up too, but diet recently improved so f/u on labs before next time and see if better Cont same regimen - Lipid panel; Future  5. Persistent atrial fibrillation (HCC) -cont same regimen with eliquis, but not in need of rate control - CBC with Differential/Platelet; Future  6.  Generalized OA of multiple sites -saw rheum about this and given hydroxychloroquine which oddly he takes prn -advised  to use tylenol -had previously been dependent on morphine so avoid this   Labs/tests ordered:   Lab Orders     CBC with Differential/Platelet     Hemoglobin A1c     COMPLETE METABOLIC PANEL WITH GFR     Lipid panel  Next appt:   12/06/2020   Asencion Loveday L. Mady Oubre, D.O. [facility] [address], [place] [address] Cell Phone (Mon-Fri 8am-5pm):  [phone] On Call:  [phone] & follow prompts after 5pm & weekends Office Phone:  [phone] Office Fax:  [phone]

## 2020-09-12 ENCOUNTER — Encounter: Payer: Self-pay | Admitting: Internal Medicine

## 2020-09-23 ENCOUNTER — Other Ambulatory Visit: Payer: Self-pay | Admitting: *Deleted

## 2020-09-23 ENCOUNTER — Other Ambulatory Visit (HOSPITAL_COMMUNITY): Payer: Self-pay | Admitting: Ophthalmology

## 2020-09-23 MED ORDER — FUROSEMIDE 40 MG PO TABS: 40.0000 mg | ORAL_TABLET | Freq: Two times a day (BID) | ORAL | 5 refills | Status: DC

## 2020-09-23 MED FILL — CROMOLYN 4% EYE DROPS: 4 | 25 days supply | Qty: 10 | Fill #0

## 2020-09-23 MED FILL — FUROSEMIDE 40 MG TAB: 40 | 30 days supply | Qty: 60 | Fill #4

## 2020-09-23 NOTE — Telephone Encounter (Signed)
Patient daughter requested refill.  Sent to pharmacy.

## 2020-10-26 ENCOUNTER — Other Ambulatory Visit (HOSPITAL_COMMUNITY): Payer: Self-pay

## 2020-10-26 MED FILL — Furosemide Tab 40 MG: ORAL | 30 days supply | Qty: 60 | Fill #0 | Status: AC

## 2020-10-27 ENCOUNTER — Other Ambulatory Visit (HOSPITAL_COMMUNITY): Payer: Self-pay

## 2020-11-21 ENCOUNTER — Other Ambulatory Visit (HOSPITAL_COMMUNITY): Payer: Self-pay

## 2020-11-21 DIAGNOSIS — T1501XA Foreign body in cornea, right eye, initial encounter: Secondary | ICD-10-CM | POA: Diagnosis not present

## 2020-11-21 DIAGNOSIS — H1045 Other chronic allergic conjunctivitis: Secondary | ICD-10-CM | POA: Diagnosis not present

## 2020-11-21 DIAGNOSIS — H04123 Dry eye syndrome of bilateral lacrimal glands: Secondary | ICD-10-CM | POA: Diagnosis not present

## 2020-11-21 DIAGNOSIS — Z961 Presence of intraocular lens: Secondary | ICD-10-CM | POA: Diagnosis not present

## 2020-11-21 MED ORDER — MOXIFLOXACIN HCL 0.5 % OP SOLN
OPHTHALMIC | 0 refills | Status: DC
  Filled 2020-11-21 (×2): qty 3, 15d supply, fill #0

## 2020-11-22 DIAGNOSIS — G894 Chronic pain syndrome: Secondary | ICD-10-CM

## 2020-11-22 DIAGNOSIS — E1142 Type 2 diabetes mellitus with diabetic polyneuropathy: Secondary | ICD-10-CM

## 2020-11-22 DIAGNOSIS — E7800 Pure hypercholesterolemia, unspecified: Secondary | ICD-10-CM

## 2020-11-22 DIAGNOSIS — T1501XD Foreign body in cornea, right eye, subsequent encounter: Secondary | ICD-10-CM | POA: Diagnosis not present

## 2020-11-22 DIAGNOSIS — I4819 Other persistent atrial fibrillation: Secondary | ICD-10-CM

## 2020-11-22 NOTE — Progress Notes (Signed)
Dr.Reed no longer within [facility] practice. Lab orders replaced with PCP Ngetich, Nelda Bucks, NP .

## 2020-11-28 ENCOUNTER — Other Ambulatory Visit (HOSPITAL_COMMUNITY): Payer: Self-pay

## 2020-11-28 MED FILL — Furosemide Tab 40 MG: ORAL | 30 days supply | Qty: 60 | Fill #1 | Status: AC

## 2020-12-06 ENCOUNTER — Ambulatory Visit: Payer: Self-pay | Admitting: Family

## 2020-12-10 MED FILL — Sulfamethoxazole-Trimethoprim Tab 800-160 MG: ORAL | 30 days supply | Qty: 60 | Fill #0 | Status: AC

## 2020-12-10 MED FILL — Potassium Chloride Microencapsulated Crys ER Tab 20 mEq: ORAL | 30 days supply | Qty: 60 | Fill #0 | Status: AC

## 2020-12-12 ENCOUNTER — Other Ambulatory Visit (HOSPITAL_COMMUNITY): Payer: Self-pay

## 2020-12-14 ENCOUNTER — Other Ambulatory Visit (HOSPITAL_COMMUNITY): Payer: Self-pay

## 2020-12-22 ENCOUNTER — Other Ambulatory Visit: Payer: Self-pay

## 2020-12-28 ENCOUNTER — Other Ambulatory Visit: Payer: Self-pay

## 2020-12-28 ENCOUNTER — Other Ambulatory Visit: Payer: HMO

## 2020-12-28 DIAGNOSIS — I4819 Other persistent atrial fibrillation: Secondary | ICD-10-CM

## 2020-12-28 DIAGNOSIS — E1142 Type 2 diabetes mellitus with diabetic polyneuropathy: Secondary | ICD-10-CM

## 2020-12-28 DIAGNOSIS — G894 Chronic pain syndrome: Secondary | ICD-10-CM

## 2020-12-28 DIAGNOSIS — E7800 Pure hypercholesterolemia, unspecified: Secondary | ICD-10-CM | POA: Diagnosis not present

## 2020-12-29 LAB — CBC WITH DIFFERENTIAL/PLATELET
Absolute Monocytes: 422 cells/uL (ref 200–950)
Basophils Absolute: 41 cells/uL (ref 0–200)
Basophils Relative: 0.6 %
Eosinophils Absolute: 61 cells/uL (ref 15–500)
Eosinophils Relative: 0.9 %
HCT: 33 % — ABNORMAL LOW (ref 38.5–50.0)
Hemoglobin: 11 g/dL — ABNORMAL LOW (ref 13.2–17.1)
Lymphs Abs: 2162 cells/uL (ref 850–3900)
MCH: 30.9 pg (ref 27.0–33.0)
MCHC: 33.3 g/dL (ref 32.0–36.0)
MCV: 92.7 fL (ref 80.0–100.0)
MPV: 11.5 fL (ref 7.5–12.5)
Monocytes Relative: 6.2 %
Neutro Abs: 4114 cells/uL (ref 1500–7800)
Neutrophils Relative %: 60.5 %
Platelets: 155 10*3/uL (ref 140–400)
RBC: 3.56 10*6/uL — ABNORMAL LOW (ref 4.20–5.80)
RDW: 13.1 % (ref 11.0–15.0)
Total Lymphocyte: 31.8 %
WBC: 6.8 10*3/uL (ref 3.8–10.8)

## 2020-12-29 LAB — COMPLETE METABOLIC PANEL WITH GFR
AG Ratio: 1.7 (calc) (ref 1.0–2.5)
ALT: 15 U/L (ref 9–46)
AST: 19 U/L (ref 10–35)
Albumin: 4.1 g/dL (ref 3.6–5.1)
Alkaline phosphatase (APISO): 66 U/L (ref 35–144)
BUN/Creatinine Ratio: 18 (calc) (ref 6–22)
BUN: 21 mg/dL (ref 7–25)
CO2: 27 mmol/L (ref 20–32)
Calcium: 9 mg/dL (ref 8.6–10.3)
Chloride: 103 mmol/L (ref 98–110)
Creat: 1.2 mg/dL — ABNORMAL HIGH (ref 0.70–1.11)
GFR, Est African American: 64 mL/min/{1.73_m2} (ref >=60)
GFR, Est Non African American: 56 mL/min/{1.73_m2} — ABNORMAL LOW (ref >=60)
Globulin: 2.4 g/dL (calc) (ref 1.9–3.7)
Glucose, Bld: 100 mg/dL — ABNORMAL HIGH (ref 65–99)
Potassium: 3.5 mmol/L (ref 3.5–5.3)
Sodium: 141 mmol/L (ref 135–146)
Total Bilirubin: 1.3 mg/dL — ABNORMAL HIGH (ref 0.2–1.2)
Total Protein: 6.5 g/dL (ref 6.1–8.1)

## 2020-12-29 LAB — HEMOGLOBIN A1C
Hgb A1c MFr Bld: 6.1 % of total Hgb — ABNORMAL HIGH (ref <5.7)
Mean Plasma Glucose: 128 mg/dL
eAG (mmol/L): 7.1 mmol/L

## 2020-12-29 LAB — LIPID PANEL
Cholesterol: 157 mg/dL (ref <200)
HDL: 52 mg/dL (ref >=40)
LDL Cholesterol (Calc): 91 mg/dL (calc)
Non-HDL Cholesterol (Calc): 105 mg/dL (calc) (ref <130)
Total CHOL/HDL Ratio: 3 (calc) (ref <5.0)
Triglycerides: 55 mg/dL (ref <150)

## 2021-01-04 ENCOUNTER — Encounter: Payer: Self-pay | Admitting: Family

## 2021-01-04 ENCOUNTER — Other Ambulatory Visit: Payer: Self-pay

## 2021-01-04 ENCOUNTER — Ambulatory Visit (INDEPENDENT_AMBULATORY_CARE_PROVIDER_SITE_OTHER): Payer: HMO | Admitting: Family

## 2021-01-04 VITALS — BP 110/80 | HR 51 | Temp 97.7°F | Resp 16 | Ht 70.0 in | Wt 178.2 lb

## 2021-01-04 DIAGNOSIS — Z0000 Encounter for general adult medical examination without abnormal findings: Secondary | ICD-10-CM

## 2021-01-04 NOTE — Progress Notes (Signed)
Subjective:   Ronnie Jackson is a 85 y.o. male who presents for Medicare Annual/Subsequent preventive examination.  Review of Systems     Cardiac Risk Factors include: advanced age (>71men, >35 women);hypertension;male gender;smoking/ tobacco exposure     Objective:    Today's Vitals   01/04/21 1033 01/04/21 1128  BP: 110/80   Pulse: (!) 51   Resp: 16   Temp: 97.7 F (36.5 C)   SpO2: 98%   Weight: 178 lb 3.2 oz (80.8 kg)   Height: 5\' 10"  (1.778 m)   PainSc:  9    Body mass index is 25.57 kg/m.  Advanced Directives 09/08/2020 08/08/2020 05/02/2020 01/29/2020 12/07/2019 11/27/2019 11/05/2019  Does Patient Have a Medical Advance Directive? No No No No No No No  Would patient like information on creating a medical advance directive? No - Patient declined No - Patient declined Yes (ED - Information included in AVS) No - Patient declined No - Patient declined No - Patient declined No - Patient declined    Current Medications (verified) Outpatient Encounter Medications as of 01/04/2021  Medication Sig   apixaban (ELIQUIS) 5 MG TABS tablet TAKE 1 TABLET BY MOUTH 2 TIMES DAILY TO HELP STROKE RELATED TO ATRIAL FIBRILLATION   cromolyn (OPTICROM) 4 % ophthalmic solution Place 1 drop into both eyes 4 (four) times daily.   cromolyn (OPTICROM) 4 % ophthalmic solution INSTILL 1 DROP INTO BOTH EYES FOUR TIMES DAILY   furosemide (LASIX) 40 MG tablet TAKE 1 TABLET BY MOUTH 2 TIMES DAILY   hydroxychloroquine (PLAQUENIL) 200 MG tablet Take 200 mg by mouth 2 (two) times daily.    moxifloxacin (VIGAMOX) 0.5 % ophthalmic solution Instill 1 drop into right eye four times a day   polyethylene glycol (MIRALAX / GLYCOLAX) 17 g packet Take 17 g by mouth daily.   potassium chloride SA (KLOR-CON) 20 MEQ tablet TAKE 2 TABLETS BY MOUTH DAILY WHEN TAKING LASIX (FUROSMIDE)   sulfamethoxazole-trimethoprim (BACTRIM DS) 800-160 MG tablet TAKE 1 TABLET BY MOUTH 2 TIMES DAILY   No facility-administered encounter  medications on file as of 01/04/2021.    Allergies (verified) Patient has no known allergies.   History: Past Medical History:  Diagnosis Date   Anxiety state, unspecified    Atrial fibrillation ([place]) 03/06/2016   Chronic pain syndrome    Constipation    Depressive disorder, not elsewhere classified    Dysphagia, unspecified(787.20)    Eye exam normal 09/01/2019   Diabetic eye exam did not reveal any retinopathy.    Herpes simplex disease    Hyperlipidemia    Hypertension    Hypopotassemia    Insomnia, unspecified    Intestinovesical fistula    Malignant neoplasm of prostate (HCC)    Myalgia and myositis, unspecified    Osteoarthritis    Pain in joint, lower leg    Bilateral knee pains   Reflux esophagitis    Rheumatoid arthritis with rheumatoid factor (HCC)    Spinal stenosis, unspecified region other than cervical    Thrombocytopenia, unspecified (HCC)    Type II or unspecified type diabetes mellitus without mention of complication, not stated as uncontrolled    Pt states that it is prediabetic   Unspecified arthropathy, pelvic region and thigh    Unspecified hereditary and idiopathic peripheral neuropathy    Past Surgical History:  Procedure Laterality Date   CARDIAC CATHETERIZATION  01/31/2004   Dr Gwenlyn Found   CARDIOVERSION N/A 07/12/2016   Procedure: CARDIOVERSION;  Surgeon: Pixie Casino, MD;  Location: [facility];  Service: Cardiovascular;  Laterality: N/A;   COLONOSCOPY  08/09/2006   Dr Christian Mate, hemorrhoids/rectal fistula   cystocopy  07/2006   Dr [name]   NASAL SINUS SURGERY  1992   PROSTATE SURGERY  1991   TOTAL HIP ARTHROPLASTY Right 02/20/2018   Procedure: RIGHT TOTAL HIP ARTHROPLASTY ANTERIOR APPROACH;  Surgeon: Mcarthur Rossetti, MD;  Location: [facility];  Service: Orthopedics;  Laterality: Right;   Family History  Problem Relation Age of Onset   Prostate cancer Father    Heart failure Mother    Lung cancer Daughter    Arthritis Sister     Social History   Socioeconomic History   Marital status: Married    Spouse name: Rodricus Candelaria   Number of children: 1   Years of education: 12   Highest education level: 12th grade  Occupational History   Occupation: Retired  Tobacco Use   Smoking status: Never   Smokeless tobacco: Current    Types: Chew   Tobacco comments:    Chews tobacco daily   Vaping Use   Vaping Use: Never used  Substance and Sexual Activity   Alcohol use: No   Drug use: No   Sexual activity: Not Currently  Other Topics Concern   Not on file  Social History Narrative   Not on file   Social Determinants of Health   Financial Resource Strain: Not on file  Food Insecurity: Not on file  Transportation Needs: Not on file  Physical Activity: Not on file  Stress: Not on file  Social Connections: Not on file    Tobacco Counseling Ready to quit: Not Answered Counseling given: Not Answered Tobacco comments: Chews tobacco daily    Clinical Intake:  Pre-visit preparation completed: No  Pain : 0-10 Pain Score: 9  Pain Type: Chronic pain Pain Location: Generalized Pain Orientation: Other (Comment) (Generalized) Pain Radiating Towards: None Pain Descriptors / Indicators: Aching Pain Onset: Other (comment) (several years) Pain Frequency: Constant Pain Relieving Factors: none Effect of Pain on Daily Activities: yes  Pain Relieving Factors: none  BMI - recorded: 25.55 Nutritional Status: BMI 25 -29 Overweight Nutritional Risks: None Diabetes: No  How often do you need to have someone help you when you read instructions, pamphlets, or other written materials from your doctor or pharmacy?: 1 - Never What is the last grade level you completed in school?: 12 grade  Diabetic?No   Interpreter Needed?: No  Information entered by :: Jhaniya Briski Ngwtich,FNP-C   Activities of Daily Living In your present state of health, do you have any difficulty performing the following activities: 01/04/2021   Hearing? Y  Vision? N  Difficulty concentrating or making decisions? N  Walking or climbing stairs? N  Dressing or bathing? Y  Comment limited ROM shoulder  Doing errands, shopping? N  Preparing Food and eating ? N  Using the Toilet? Y  Comment constipation  In the past six months, have you accidently leaked urine? N  Do you have problems with loss of bowel control? N  Managing your Medications? N  Managing your Finances? N  Housekeeping or managing your Housekeeping? N  Some recent data might be hidden    Patient Care Team: Dynver Clemson, Nelda Bucks, NP as PCP - General (Family Medicine) Constance Haw, MD as PCP - Cardiology (Cardiology) Tomasita Morrow, NP as Nurse Practitioner (Cardiology) Deneise Lever, MD as Consulting Physician (Pulmonary Disease) Kristeen Miss, MD as Consulting Physician (Neurosurgery) Ronny Bacon, MD as Referring  Physician (Surgery) [name], Fransico Him., MD (General Surgery) Bo Merino, MD as Consulting Physician (Rheumatology) Clent Jacks, MD as Consulting Physician (Ophthalmology)  Indicate any recent Medical Services you may have received from other than [facility] providers in the past year (date may be approximate).     Assessment:   This is a routine wellness examination for [place].  Hearing/Vision screen Hearing Screening - Comments:: No Hearing Concerns.  Vision Screening - Comments:: No Vision Concerns.Last eye exam was June 2022.  Dietary issues and exercise activities discussed: Current Exercise Habits: The patient has a physically strenuous job, but has no regular exercise apart from work., Exercise limited by: Other - see comments (generalized arthritis pain)   Goals Addressed   None    Depression Screen PHQ 2/9 Scores 01/04/2021 05/02/2020 05/02/2020 11/27/2019 11/05/2019 11/02/2019 10/19/2019  PHQ - 2 Score 0 0 0 0 4 4 0  PHQ- 9 Score - - - - 6 9 -    Fall Risk Fall Risk  01/04/2021 09/08/2020 08/08/2020 05/02/2020  05/02/2020  Falls in the past year? 0 0 0 0 0  Number falls in past yr: 0 0 0 0 0  Injury with Fall? 0 0 0 0 0  Risk for fall due to : - - - - -  Follow up - - - - -    [place]:  Any stairs in or around the home? No  If so, are there any without handrails? No  Home free of loose throw rugs in walkways, pet beds, electrical cords, etc? No  Adequate lighting in your home to reduce risk of falls? Yes   ASSISTIVE DEVICES UTILIZED TO PREVENT FALLS:  Life alert? No  Use of a cane, walker or w/c? No  Grab bars in the bathroom? Yes  Shower chair or bench in shower? No  Elevated toilet seat or a handicapped toilet? No   TIMED UP AND GO:  Was the test performed? Yes .  Length of time to ambulate 10 feet: 20 sec.   Gait slow and steady without use of assistive device  Cognitive Function: MMSE - Mini Mental State Exam 01/04/2021 11/20/2017 11/15/2016  Not completed: Refused - -  Orientation to time - 3 4  Orientation to Place - 5 4  Registration - 3 3  Attention/ Calculation - 5 5  Recall - 2 2  Language- name 2 objects - 2 2  Language- repeat - 1 1  Language- follow 3 step command - 3 3  Language- read & follow direction - 1 1  Write a sentence - 1 1  Copy design - 1 1  Total score - 27 27     6CIT Screen 11/27/2019 11/25/2018  What Year? 0 points 0 points  What month? 0 points 0 points  What time? 0 points 0 points  Count back from 20 0 points 0 points  Months in reverse 0 points 4 points  Repeat phrase 10 points 4 points  Total Score 10 8    Immunizations Immunization History  Administered Date(s) Administered   Fluad Quad(high Dose 65+) 10/19/2019   Influenza, High Dose Seasonal PF 04/18/2017, 05/12/2018   Influenza,inj,Quad PF,6+ Mos 06/21/2015   Pneumococcal Conjugate-13 06/21/2015   Pneumococcal Polysaccharide-23 11/15/2016    TDAP status: Due, Education has been provided regarding the importance of this vaccine. Advised may  receive this vaccine at local pharmacy or Health Dept. Aware to provide a copy of the vaccination record if obtained  from local pharmacy or Health Dept. Verbalized acceptance and understanding.  Flu Vaccine status: Up to date  Pneumococcal vaccine status: Up to date  Covid-19 vaccine status: Declined, Education has been provided regarding the importance of this vaccine but patient still declined. Advised may receive this vaccine at local pharmacy or Health Dept.or vaccine clinic. Aware to provide a copy of the vaccination record if obtained from local pharmacy or Health Dept. Verbalized acceptance and understanding.  Qualifies for Shingles Vaccine? Yes   Zostavax completed No   Shingrix Completed?: No.    Education has been provided regarding the importance of this vaccine. Patient has been advised to call insurance company to determine out of pocket expense if they have not yet received this vaccine. Advised may also receive vaccine at local pharmacy or Health Dept. Verbalized acceptance and understanding.  Screening Tests Health Maintenance  Topic Date Due   COVID-19 Vaccine (1) Never done   Zoster Vaccines- Shingrix (1 of 2) Never done   OPHTHALMOLOGY EXAM  08/31/2020   URINE MICROALBUMIN  10/18/2020   TETANUS/TDAP  11/01/2024 (Originally 08/02/1954)   INFLUENZA VACCINE  02/20/2021   FOOT EXAM  05/02/2021   HEMOGLOBIN A1C  06/29/2021   PNA vac Low Risk Adult  Completed   HPV VACCINES  Aged Out    Health Maintenance  Health Maintenance Due  Topic Date Due   COVID-19 Vaccine (1) Never done   Zoster Vaccines- Shingrix (1 of 2) Never done   OPHTHALMOLOGY EXAM  08/31/2020   URINE MICROALBUMIN  10/18/2020    Colorectal cancer screening: No longer required.   Lung Cancer Screening: (Low Dose CT Chest recommended if Age 69-80 years, 30 pack-year currently smoking OR have quit w/in 15years.) does not qualify.   Lung Cancer Screening Referral:No   Additional Screening:  Hepatitis  C Screening: does not qualify; Completed No   Vision Screening: Recommended annual ophthalmology exams for early detection of glaucoma and other disorders of the eye. Is the patient up to date with their annual eye exam?  Yes  Who is the provider or what is the name of the office in which the patient attends annual eye exams? Dr.Groat  If pt is not established with a provider, would they like to be referred to a provider to establish care? No .   Dental Screening: Recommended annual dental exams for proper oral hygiene  Community Resource Referral / Chronic Care Management: CRR required this visit?  No   CCM required this visit?  No      Plan:     I have personally reviewed and noted the following in the patient's chart:   Medical and social history Use of alcohol, tobacco or illicit drugs  Current medications and supplements including opioid prescriptions. Patient is not currently taking opioid prescriptions. Functional ability and status Nutritional status Physical activity Advanced directives List of other physicians Hospitalizations, surgeries, and ER visits in previous 12 months Vitals Screenings to include cognitive, depression, and falls Referrals and appointments  In addition, I have reviewed and discussed with patient certain preventive protocols, quality metrics, and best practice recommendations. A written personalized care plan for preventive services as well as general preventive health recommendations were provided to patient.     Sandrea Hughs, NP   01/04/2021   Nurse Notes:Declines shingrix,COVID-19 vaccine

## 2021-01-04 NOTE — Patient Instructions (Signed)
Ronnie Jackson, Thank you for taking time to come for your Medicare Wellness Visit. I appreciate your ongoing commitment to your health goals. Please review the following plan we discussed and let me know if I can assist you in the future.   Screening recommendations/referrals: Colonoscopy N/A  Recommended yearly ophthalmology/optometry visit for glaucoma screening and checkup Recommended yearly dental visit for hygiene and checkup  Vaccinations: Influenza vaccine Up to date  Pneumococcal vaccine Up to date  Tdap vaccine declined  Shingles vaccine declined   Advanced directives: No   Conditions/risks identified: Advance age male > 83 yrs,Hypertension,male,Hx of smoking   Next appointment: 1 year   Preventive Care 83 Years and Older, Male, Male Preventive care refers to lifestyle choices and visits with your health care provider that can promote health and wellness. What does preventive care include? A yearly physical exam. This is also called an annual well check. Dental exams once or twice a year. Routine eye exams. Ask your health care provider how often you should have your eyes checked. Personal lifestyle choices, including: Daily care of your teeth and gums. Regular physical activity. Eating a healthy diet. Avoiding tobacco and drug use. Limiting alcohol use. Practicing safe sex. Taking low doses of aspirin every day. Taking vitamin and mineral supplements as recommended by your health care provider. What happens during an annual well check? The services and screenings done by your health care provider during your annual well check will depend on your age, overall health, lifestyle risk factors, and family history of disease. Counseling  Your health care provider may ask you questions about your: Alcohol use. Tobacco use. Drug use. Emotional well-being. Home and relationship well-being. Sexual activity. Eating habits. History of falls. Memory and ability to understand  (cognition). Work and work Statistician. Screening  You may have the following tests or measurements: Height, weight, and BMI. Blood pressure. Lipid and cholesterol levels. These may be checked every 5 years, or more frequently if you are over 83 years old. Skin check. Lung cancer screening. You may have this screening every year starting at age 83 if you have a 30-pack-year history of smoking and currently smoke or have quit within the past 15 years. Fecal occult blood test (FOBT) of the stool. You may have this test every year starting at age 83. Flexible sigmoidoscopy or colonoscopy. You may have a sigmoidoscopy every 5 years or a colonoscopy every 10 years starting at age 86. Prostate cancer screening. Recommendations will vary depending on your family history and other risks. Hepatitis C blood test. Hepatitis B blood test. Sexually transmitted disease (STD) testing. Diabetes screening. This is done by checking your blood sugar (glucose) after you have not eaten for a while (fasting). You may have this done every 1-3 years. Abdominal aortic aneurysm (AAA) screening. You may need this if you are a current or former smoker. Osteoporosis. You may be screened starting at age 83 if you are at high risk. Talk with your health care provider about your test results, treatment options, and if necessary, the need for more tests. Vaccines  Your health care provider may recommend certain vaccines, such as: Influenza vaccine. This is recommended every year. Tetanus, diphtheria, and acellular pertussis (Tdap, Td) vaccine. You may need a Td booster every 10 years. Zoster vaccine. You may need this after age 83. Pneumococcal 13-valent conjugate (PCV13) vaccine. One dose is recommended after age 83. Pneumococcal polysaccharide (PPSV23) vaccine. One dose is recommended after age 83. Talk to your health care provider about which  screenings and vaccines you need and how often you need them. This  information is not intended to replace advice given to you by your health care provider. Make sure you discuss any questions you have with your health care provider. Document Released: 08/05/2015 Document Revised: 03/28/2016 Document Reviewed: 05/10/2015 Elsevier Interactive Patient Education  2017 [place] Prevention in the Home Falls can cause injuries. They can happen to people of all ages. There are many things you can do to make your home safe and to help prevent falls. What can I do on the outside of my home? Regularly fix the edges of walkways and driveways and fix any cracks. Remove anything that might make you trip as you walk through a door, such as a raised step or threshold. Trim any bushes or trees on the path to your home. Use bright outdoor lighting. Clear any walking paths of anything that might make someone trip, such as rocks or tools. Regularly check to see if handrails are loose or broken. Make sure that both sides of any steps have handrails. Any raised decks and porches should have guardrails on the edges. Have any leaves, snow, or ice cleared regularly. Use sand or salt on walking paths during winter. Clean up any spills in your garage right away. This includes oil or grease spills. What can I do in the bathroom? Use night lights. Install grab bars by the toilet and in the tub and shower. Do not use towel bars as grab bars. Use non-skid mats or decals in the tub or shower. If you need to sit down in the shower, use a plastic, non-slip stool. Keep the floor dry. Clean up any water that spills on the floor as soon as it happens. Remove soap buildup in the tub or shower regularly. Attach bath mats securely with double-sided non-slip rug tape. Do not have throw rugs and other things on the floor that can make you trip. What can I do in the bedroom? Use night lights. Make sure that you have a light by your bed that is easy to reach. Do not use any sheets or  blankets that are too big for your bed. They should not hang down onto the floor. Have a firm chair that has side arms. You can use this for support while you get dressed. Do not have throw rugs and other things on the floor that can make you trip. What can I do in the kitchen? Clean up any spills right away. Avoid walking on wet floors. Keep items that you use a lot in easy-to-reach places. If you need to reach something above you, use a strong step stool that has a grab bar. Keep electrical cords out of the way. Do not use floor polish or wax that makes floors slippery. If you must use wax, use non-skid floor wax. Do not have throw rugs and other things on the floor that can make you trip. What can I do with my stairs? Do not leave any items on the stairs. Make sure that there are handrails on both sides of the stairs and use them. Fix handrails that are broken or loose. Make sure that handrails are as long as the stairways. Check any carpeting to make sure that it is firmly attached to the stairs. Fix any carpet that is loose or worn. Avoid having throw rugs at the top or bottom of the stairs. If you do have throw rugs, attach them to the floor with carpet  tape. Make sure that you have a light switch at the top of the stairs and the bottom of the stairs. If you do not have them, ask someone to add them for you. What else can I do to help prevent falls? Wear shoes that: Do not have high heels. Have rubber bottoms. Are comfortable and fit you well. Are closed at the toe. Do not wear sandals. If you use a stepladder: Make sure that it is fully opened. Do not climb a closed stepladder. Make sure that both sides of the stepladder are locked into place. Ask someone to hold it for you, if possible. Clearly mark and make sure that you can see: Any grab bars or handrails. First and last steps. Where the edge of each step is. Use tools that help you move around (mobility aids) if they are  needed. These include: Canes. Walkers. Scooters. Crutches. Turn on the lights when you go into a dark area. Replace any light bulbs as soon as they burn out. Set up your furniture so you have a clear path. Avoid moving your furniture around. If any of your floors are uneven, fix them. If there are any pets around you, be aware of where they are. Review your medicines with your doctor. Some medicines can make you feel dizzy. This can increase your chance of falling. Ask your doctor what other things that you can do to help prevent falls. This information is not intended to replace advice given to you by your health care provider. Make sure you discuss any questions you have with your health care provider. Document Released: 05/05/2009 Document Revised: 12/15/2015 Document Reviewed: 08/13/2014 Elsevier Interactive Patient Education  2017 [employer].

## 2021-01-05 ENCOUNTER — Other Ambulatory Visit (HOSPITAL_COMMUNITY): Payer: Self-pay

## 2021-01-05 MED FILL — Furosemide Tab 40 MG: ORAL | 30 days supply | Qty: 60 | Fill #2 | Status: AC

## 2021-01-17 ENCOUNTER — Ambulatory Visit: Payer: HMO | Admitting: Family

## 2021-02-27 ENCOUNTER — Other Ambulatory Visit (HOSPITAL_COMMUNITY): Payer: Self-pay

## 2021-02-27 MED FILL — Furosemide Tab 40 MG: ORAL | 30 days supply | Qty: 60 | Fill #3 | Status: AC

## 2021-02-27 MED FILL — Sulfamethoxazole-Trimethoprim Tab 800-160 MG: ORAL | 30 days supply | Qty: 60 | Fill #1 | Status: AC

## 2021-02-27 MED FILL — Potassium Chloride Microencapsulated Crys ER Tab 20 mEq: ORAL | 30 days supply | Qty: 60 | Fill #1 | Status: AC

## 2021-03-22 DIAGNOSIS — H04123 Dry eye syndrome of bilateral lacrimal glands: Secondary | ICD-10-CM | POA: Diagnosis not present

## 2021-03-22 DIAGNOSIS — H353131 Nonexudative age-related macular degeneration, bilateral, early dry stage: Secondary | ICD-10-CM | POA: Diagnosis not present

## 2021-03-22 DIAGNOSIS — E119 Type 2 diabetes mellitus without complications: Secondary | ICD-10-CM | POA: Diagnosis not present

## 2021-03-22 DIAGNOSIS — Z961 Presence of intraocular lens: Secondary | ICD-10-CM | POA: Diagnosis not present

## 2021-03-22 DIAGNOSIS — H1045 Other chronic allergic conjunctivitis: Secondary | ICD-10-CM | POA: Diagnosis not present

## 2021-04-19 ENCOUNTER — Other Ambulatory Visit (HOSPITAL_COMMUNITY): Payer: Self-pay

## 2021-04-19 MED FILL — Potassium Chloride Microencapsulated Crys ER Tab 20 mEq: ORAL | 30 days supply | Qty: 60 | Fill #2 | Status: AC

## 2021-04-19 MED FILL — Sulfamethoxazole-Trimethoprim Tab 800-160 MG: ORAL | 30 days supply | Qty: 60 | Fill #2 | Status: AC

## 2021-04-21 ENCOUNTER — Other Ambulatory Visit: Payer: Self-pay | Admitting: *Deleted

## 2021-04-21 ENCOUNTER — Other Ambulatory Visit (HOSPITAL_COMMUNITY): Payer: Self-pay

## 2021-04-21 MED FILL — Furosemide Tab 40 MG: ORAL | 30 days supply | Qty: 60 | Fill #4 | Status: AC

## 2021-04-21 MED FILL — Apixaban Tab 5 MG: ORAL | 30 days supply | Qty: 60 | Fill #0 | Status: AC

## 2021-04-21 NOTE — Telephone Encounter (Signed)
Patient daughter called and left message on clinical intake stating that patient needed a refill on Eliquis.   I called daughter to confirm pharmacy and she stated "Never Mind" patient has a refill and pharmacy is refilling.

## 2021-05-23 ENCOUNTER — Ambulatory Visit (INDEPENDENT_AMBULATORY_CARE_PROVIDER_SITE_OTHER): Payer: HMO | Admitting: Family

## 2021-05-23 ENCOUNTER — Encounter: Payer: Self-pay | Admitting: Family

## 2021-05-23 ENCOUNTER — Other Ambulatory Visit: Payer: Self-pay

## 2021-05-23 ENCOUNTER — Other Ambulatory Visit (HOSPITAL_COMMUNITY): Payer: Self-pay

## 2021-05-23 VITALS — BP 110/80 | HR 73 | Temp 97.3°F | Resp 16 | Ht 70.0 in | Wt 178.6 lb

## 2021-05-23 DIAGNOSIS — K631 Perforation of intestine (nontraumatic): Secondary | ICD-10-CM | POA: Diagnosis not present

## 2021-05-23 DIAGNOSIS — I4819 Other persistent atrial fibrillation: Secondary | ICD-10-CM

## 2021-05-23 DIAGNOSIS — M159 Polyosteoarthritis, unspecified: Secondary | ICD-10-CM

## 2021-05-23 DIAGNOSIS — E7800 Pure hypercholesterolemia, unspecified: Secondary | ICD-10-CM

## 2021-05-23 DIAGNOSIS — E1142 Type 2 diabetes mellitus with diabetic polyneuropathy: Secondary | ICD-10-CM

## 2021-05-23 DIAGNOSIS — R600 Localized edema: Secondary | ICD-10-CM | POA: Diagnosis not present

## 2021-05-23 MED ORDER — HYDROXYCHLOROQUINE SULFATE 200 MG PO TABS
200.0000 mg | ORAL_TABLET | Freq: Two times a day (BID) | ORAL | 2 refills | Status: AC
  Filled 2021-05-23: qty 60, 30d supply, fill #0

## 2021-05-23 NOTE — Progress Notes (Signed)
Provider: Richarda Blade FNP-C   [name], [name], NP  Patient Care Team: Arcelia Pals, [name], NP as PCP - General (Family Medicine) [name], MD as PCP - Cardiology (Cardiology) [name], NP as Nurse Practitioner (Cardiology) [name], MD as Consulting Physician (Pulmonary Disease) [name], MD as Consulting Physician (Neurosurgery) [name], MD as Referring Physician (Surgery) [name], [name], MD (General Surgery) [name], MD as Consulting Physician (Rheumatology) [name], MD as Consulting Physician (Ophthalmology)  Extended Emergency Contact Information Primary Emergency Contact: [name],[name] Mobile Phone: [phone] Relation: Son Secondary Emergency Contact: [name],[name] Mobile Phone: [phone] Relation: Daughter  Code Status:  Full Code  Goals of care: Advanced Directive information Advanced Directives 05/23/2021  Does Patient Have a Medical Advance Directive? No  Would patient like information on creating a medical advance directive? No - Patient declined     Chief Complaint  Patient presents with   Medical Management of Chronic Issues    9 month follow-up. Discuss need for eye exam. Foot exam, flu vaccine, and Malb today.    HPI:  Pt is a 85 y.o. male seen today for 9 months follow up for medical management of chronic diseases. Has a medical history of Afib on Eliquis, Hypertension,Hyperlipidemia,Osteoarthritis of multiple sites,Insomnia,Generalized anxiety, lower extremities edema among other conditions. He is here with daughter who assist with HPI information patient Ronnie Jackson of hearing. States has been taking his Eliquis and Fluid pill on a regular basis.  Denies chest pain, shortness of breath, lower extremity edema, fatigue, palpitations, melena, hematuria, hemoptysis, diaphoresis, weakness, presyncope, syncope, orthopnea, and PND. Request Hydrochloroquine refills states helping with his  arthritic pain on the neck,right hip and knees.pain tends to waking him up at night but hydrochloroquine has helped a lot which was prescribed by Orthopedic.  Due for flu shot but declines.    Past Medical History:  Diagnosis Date   Anxiety state, unspecified    Atrial fibrillation (HCC) 03/06/2016   Chronic pain syndrome    Constipation    Depressive disorder, not elsewhere classified    Dysphagia, unspecified(787.20)    Eye exam normal 09/01/2019   Diabetic eye exam did not reveal any retinopathy.    Herpes simplex disease    Hyperlipidemia    Hypertension    Hypopotassemia    Insomnia, unspecified    Intestinovesical fistula    Malignant neoplasm of prostate (HCC)    Myalgia and myositis, unspecified    Osteoarthritis    Pain in joint, lower leg    Bilateral knee pains   Reflux esophagitis    Rheumatoid arthritis with rheumatoid factor (HCC)    Spinal stenosis, unspecified region other than cervical    Thrombocytopenia, unspecified (HCC)    Type II or unspecified type diabetes mellitus without mention of complication, not stated as uncontrolled    Pt states that it is prediabetic   Unspecified arthropathy, pelvic region and thigh    Unspecified hereditary and idiopathic peripheral neuropathy    Past Surgical History:  Procedure Laterality Date   CARDIAC CATHETERIZATION  01/31/2004   Dr Allyson Sabal   CARDIOVERSION N/A 07/12/2016   Procedure: CARDIOVERSION;  Surgeon: Chrystie Nose, MD;  Location: [facility] ENDOSCOPY;  Service: Cardiovascular;  Laterality: N/A;   COLONOSCOPY  08/09/2006   Dr Claudine Mouton, hemorrhoids/rectal fistula   cystocopy  07/2006   Dr [name]   NASAL SINUS SURGERY  1992   PROSTATE SURGERY  1991   TOTAL HIP ARTHROPLASTY Right 02/20/2018   Procedure: RIGHT TOTAL  HIP ARTHROPLASTY ANTERIOR APPROACH;  Surgeon: Mcarthur Rossetti, MD;  Location: [facility] ORS;  Service: Orthopedics;  Laterality: Right;    No Known Allergies  Allergies as of 05/23/2021   No Known  Allergies      Medication List        Accurate as of May 23, 2021  2:15 PM. If you have any questions, ask your nurse or doctor.          cromolyn 4 % ophthalmic solution Commonly known as: OPTICROM Place 1 drop into both eyes 4 (four) times daily.   cromolyn 4 % ophthalmic solution Commonly known as: OPTICROM INSTILL 1 DROP INTO BOTH EYES FOUR TIMES DAILY   Eliquis 5 MG Tabs tablet Generic drug: apixaban TAKE 1 TABLET BY MOUTH 2 TIMES DAILY TO HELP STROKE RELATED TO ATRIAL FIBRILLATION   furosemide 40 MG tablet Commonly known as: LASIX TAKE 1 TABLET BY MOUTH 2 TIMES DAILY   hydroxychloroquine 200 MG tablet Commonly known as: PLAQUENIL Take 200 mg by mouth 2 (two) times daily.   moxifloxacin 0.5 % ophthalmic solution Commonly known as: VIGAMOX Instill 1 drop into right eye four times a day   polyethylene glycol 17 g packet Commonly known as: MIRALAX / GLYCOLAX Take 17 g by mouth daily.   potassium chloride SA 20 MEQ tablet Commonly known as: KLOR-CON TAKE 2 TABLETS BY MOUTH DAILY WHEN TAKING LASIX (FUROSMIDE)   sulfamethoxazole-trimethoprim 800-160 MG tablet Commonly known as: BACTRIM DS TAKE 1 TABLET BY MOUTH 2 TIMES DAILY        Review of Systems  Constitutional:  Negative for appetite change, chills, fatigue, fever and unexpected weight change.  HENT:  Positive for hearing loss. Negative for congestion, dental problem, ear discharge, ear pain, facial swelling, nosebleeds, postnasal drip, rhinorrhea, sinus pressure, sinus pain, sneezing, sore throat, tinnitus and trouble swallowing.        Right hearing aid  Eyes:  Negative for pain, discharge, redness, itching and visual disturbance.  Respiratory:  Negative for cough, chest tightness, shortness of breath and wheezing.   Cardiovascular:  Negative for chest pain, palpitations and leg swelling.  Gastrointestinal:  Negative for abdominal distention, abdominal pain, blood in stool, constipation,  diarrhea, nausea and vomiting.  Endocrine: Negative for cold intolerance, heat intolerance, polydipsia, polyphagia and polyuria.  Genitourinary:  Negative for difficulty urinating, dysuria, flank pain, frequency and urgency.  Musculoskeletal:  Positive for arthralgias, gait problem and neck pain. Negative for back pain, joint swelling, myalgias and neck stiffness.       Chronic Pain with turning of head to the left  Skin:  Negative for color change, pallor, rash and wound.  Neurological:  Negative for dizziness, syncope, speech difficulty, weakness, light-headedness, numbness and headaches.  Hematological:  Does not bruise/bleed easily.  Psychiatric/Behavioral:  Negative for agitation, behavioral problems, confusion, hallucinations, self-injury, sleep disturbance and suicidal ideas. The patient is not nervous/anxious.    Immunization History  Administered Date(s) Administered   Fluad Quad(high Dose 65+) 10/19/2019   Influenza, High Dose Seasonal PF 04/18/2017, 05/12/2018   Influenza,inj,Quad PF,6+ Mos 06/21/2015   Pneumococcal Conjugate-13 06/21/2015   Pneumococcal Polysaccharide-23 11/15/2016   Pertinent  Health Maintenance Due  Topic Date Due   OPHTHALMOLOGY EXAM  08/31/2020   URINE MICROALBUMIN  10/18/2020   INFLUENZA VACCINE  02/20/2021   FOOT EXAM  05/02/2021   HEMOGLOBIN A1C  06/29/2021   Fall Risk 05/02/2020 08/08/2020 09/08/2020 01/04/2021 05/23/2021  Falls in the past year? 0 0 0 0 0  Was there an injury with Fall? 0 0 0 0 0  Fall Risk Category Calculator 0 0 0 0 0  Fall Risk Category Low Low Low Low Low  Patient Fall Risk Level Low fall risk Low fall risk Low fall risk Low fall risk Low fall risk  Patient at Risk for Falls Due to - - - - No Fall Risks  Fall risk Follow up - - - - Falls evaluation completed   Functional Status Survey:    Vitals:   05/23/21 1328  BP: 110/80  Pulse: 73  Resp: 16  Temp: (!) 97.3 F (36.3 C)  SpO2: 98%  Weight: 178 lb 9.6 oz (81 kg)   Height: $Remove'5\' 10"'PkyNHcT$  (1.778 m)   Body mass index is 25.63 kg/m. Physical Exam Vitals reviewed.  Constitutional:      General: He is not in acute distress.    Appearance: Normal appearance. He is normal weight. He is not ill-appearing or diaphoretic.  HENT:     Head: Normocephalic.     Right Ear: Tympanic membrane, ear canal and external ear normal. There is no impacted cerumen.     Left Ear: Tympanic membrane, ear canal and external ear normal. There is no impacted cerumen.     Nose: Nose normal. No congestion or rhinorrhea.     Mouth/Throat:     Mouth: Mucous membranes are moist.     Pharynx: Oropharynx is clear. No oropharyngeal exudate or posterior oropharyngeal erythema.     Comments: Missing all teeth  Eyes:     General: No scleral icterus.       Right eye: No discharge.        Left eye: No discharge.     Extraocular Movements: Extraocular movements intact.     Conjunctiva/sclera: Conjunctivae normal.     Pupils: Pupils are equal, round, and reactive to light.  Neck:     Vascular: No carotid bruit.     Comments: Decrease ROM with rotation to the left side.Pain elicited with ROM Cardiovascular:     Rate and Rhythm: Normal rate and regular rhythm.     Pulses: Normal pulses.     Heart sounds: Normal heart sounds. No murmur heard.   No friction rub. No gallop.  Pulmonary:     Effort: Pulmonary effort is normal. No respiratory distress.     Breath sounds: Normal breath sounds. No wheezing, rhonchi or rales.  Chest:     Chest wall: No tenderness.  Abdominal:     General: Bowel sounds are normal. There is no distension.     Palpations: Abdomen is soft. There is no mass.     Tenderness: There is no abdominal tenderness. There is no right CVA tenderness, left CVA tenderness, guarding or rebound.  Musculoskeletal:        General: No swelling or tenderness.     Right shoulder: Decreased range of motion. Normal strength. Normal pulse.     Left shoulder: Decreased range of motion.  Normal strength. Normal pulse.     Cervical back: No erythema, rigidity, tenderness or crepitus. Pain with movement present. No spinous process tenderness or muscular tenderness. Decreased range of motion.     Right lower leg: No edema.     Left lower leg: No edema.  Lymphadenopathy:     Cervical: No cervical adenopathy.  Skin:    General: Skin is warm and dry.     Coloration: Skin is not pale.     Findings: No bruising, erythema, lesion or  rash.  Neurological:     Mental Status: He is alert and oriented to person, place, and time.     Cranial Nerves: No cranial nerve deficit.     Sensory: No sensory deficit.     Motor: No weakness.     Coordination: Coordination normal.     Gait: Gait abnormal.     Comments: HOH right hearing aids in place   Psychiatric:        Mood and Affect: Mood normal.        Speech: Speech normal.        Behavior: Behavior normal.        Thought Content: Thought content normal.        Judgment: Judgment normal.    Labs reviewed: Recent Labs    09/05/20 0939 12/28/20 1114  NA 142 141  K 3.1* 3.5  CL 103 103  CO2 28 27  GLUCOSE 91 100*  BUN 14 21  CREATININE 1.34* 1.20*  CALCIUM 8.7 9.0   Recent Labs    12/28/20 1114  AST 19  ALT 15  BILITOT 1.3*  PROT 6.5   Recent Labs    12/28/20 1114  WBC 6.8  NEUTROABS 4,114  HGB 11.0*  HCT 33.0*  MCV 92.7  PLT 155   No results found for: TSH Lab Results  Component Value Date   HGBA1C 6.1 (H) 12/28/2020   Lab Results  Component Value Date   CHOL 157 12/28/2020   HDL 52 12/28/2020   LDLCALC 91 12/28/2020   TRIG 55 12/28/2020   CHOLHDL 3.0 12/28/2020    Significant Diagnostic Results in last 30 days:  No results found.  Assessment/Plan 1. DM type 2 with diabetic peripheral neuropathy ([place]) Lab Results  Component Value Date   HGBA1C 6.1 (H) 12/28/2020  Well controlled  Continue with dietary modification  - CBC with Differential/Platelet - CMP with eGFR([facility]) - TSH -  Hemoglobin A1c - Microalbumin / creatinine urine ratio  2. Pure hypercholesterolemia LDL at [place].Total cholesterol has improved. Continue with dietary modification and exercise  - Lipid Panel  3. Persistent atrial fibrillation (HCC) HR controlled on Apixaban - CBC with Differential/Platelet  4. Generalized osteoarthritis of multiple sites Chronic on right hip,shoulders,neck and knees Continue on Hydroxychloroquine  - hydroxychloroquine (PLAQUENIL) 200 MG tablet; Take 1 tablet (200 mg total) by mouth 2 (two) times daily.  Dispense: 60 tablet; Refill: 2  5. Edema of both lower extremities Weight stable no signs of fluid over load as long as he takes his diuretic - continue on Furosemide   - CMP with eGFR([facility])  6. 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

## 2021-05-24 LAB — TSH: TSH: 2.86 mIU/L (ref 0.40–4.50)

## 2021-05-24 LAB — LIPID PANEL
Cholesterol: 134 mg/dL (ref <200)
HDL: 49 mg/dL (ref >=40)
LDL Cholesterol (Calc): 72 mg/dL (calc)
Non-HDL Cholesterol (Calc): 85 mg/dL (calc) (ref <130)
Total CHOL/HDL Ratio: 2.7 (calc) (ref <5.0)
Triglycerides: 46 mg/dL (ref <150)

## 2021-05-24 LAB — HEMOGLOBIN A1C
Hgb A1c MFr Bld: 6 % of total Hgb — ABNORMAL HIGH (ref <5.7)
Mean Plasma Glucose: 126 mg/dL
eAG (mmol/L): 7 mmol/L

## 2021-05-24 LAB — CBC WITH DIFFERENTIAL/PLATELET
Absolute Monocytes: 482 cells/uL (ref 200–950)
Basophils Absolute: 40 cells/uL (ref 0–200)
Basophils Relative: 0.6 %
Eosinophils Absolute: 47 cells/uL (ref 15–500)
Eosinophils Relative: 0.7 %
HCT: 29.4 % — ABNORMAL LOW (ref 38.5–50.0)
Hemoglobin: 9.6 g/dL — ABNORMAL LOW (ref 13.2–17.1)
Lymphs Abs: 2673 cells/uL (ref 850–3900)
MCH: 30.5 pg (ref 27.0–33.0)
MCHC: 32.7 g/dL (ref 32.0–36.0)
MCV: 93.3 fL (ref 80.0–100.0)
MPV: 11.9 fL (ref 7.5–12.5)
Monocytes Relative: 7.2 %
Neutro Abs: 3457 cells/uL (ref 1500–7800)
Neutrophils Relative %: 51.6 %
Platelets: 166 10*3/uL (ref 140–400)
RBC: 3.15 10*6/uL — ABNORMAL LOW (ref 4.20–5.80)
RDW: 14 % (ref 11.0–15.0)
Total Lymphocyte: 39.9 %
WBC: 6.7 10*3/uL (ref 3.8–10.8)

## 2021-05-24 LAB — MICROALBUMIN / CREATININE URINE RATIO
Creatinine, Urine: 203 mg/dL (ref 20–320)
Microalb Creat Ratio: 27 mcg/mg creat (ref <30)
Microalb, Ur: 5.5 mg/dL

## 2021-05-24 LAB — COMPLETE METABOLIC PANEL WITH GFR
AG Ratio: 1.7 (calc) (ref 1.0–2.5)
ALT: 9 U/L (ref 9–46)
AST: 17 U/L (ref 10–35)
Albumin: 4.4 g/dL (ref 3.6–5.1)
Alkaline phosphatase (APISO): 63 U/L (ref 35–144)
BUN/Creatinine Ratio: 10 (calc) (ref 6–22)
BUN: 14 mg/dL (ref 7–25)
CO2: 28 mmol/L (ref 20–32)
Calcium: 8.9 mg/dL (ref 8.6–10.3)
Chloride: 104 mmol/L (ref 98–110)
Creat: 1.45 mg/dL — ABNORMAL HIGH (ref 0.70–1.22)
Globulin: 2.6 g/dL (calc) (ref 1.9–3.7)
Glucose, Bld: 108 mg/dL (ref 65–139)
Potassium: 4 mmol/L (ref 3.5–5.3)
Sodium: 141 mmol/L (ref 135–146)
Total Bilirubin: 1.3 mg/dL — ABNORMAL HIGH (ref 0.2–1.2)
Total Protein: 7 g/dL (ref 6.1–8.1)
eGFR: 48 mL/min/{1.73_m2} — ABNORMAL LOW (ref >=60)

## 2021-06-08 ENCOUNTER — Other Ambulatory Visit (HOSPITAL_COMMUNITY): Payer: Self-pay

## 2021-06-08 MED FILL — Furosemide Tab 40 MG: ORAL | 30 days supply | Qty: 60 | Fill #5 | Status: AC

## 2021-06-10 ENCOUNTER — Other Ambulatory Visit (HOSPITAL_COMMUNITY): Payer: Self-pay

## 2021-08-18 ENCOUNTER — Other Ambulatory Visit: Payer: Self-pay | Admitting: *Deleted

## 2021-08-18 ENCOUNTER — Other Ambulatory Visit (HOSPITAL_COMMUNITY): Payer: Self-pay

## 2021-08-18 ENCOUNTER — Other Ambulatory Visit: Payer: Self-pay

## 2021-08-18 MED ORDER — FUROSEMIDE 40 MG PO TABS
ORAL_TABLET | ORAL | 5 refills | Status: DC
  Filled 2021-08-18: qty 60, 30d supply, fill #0
  Filled 2021-09-21: qty 60, 30d supply, fill #1
  Filled 2021-10-25: qty 60, 30d supply, fill #2

## 2021-08-18 NOTE — Telephone Encounter (Signed)
Patient daughter, patty requested refill.

## 2021-09-05 ENCOUNTER — Other Ambulatory Visit: Payer: Self-pay

## 2021-09-05 ENCOUNTER — Other Ambulatory Visit (HOSPITAL_COMMUNITY): Payer: Self-pay

## 2021-09-05 ENCOUNTER — Other Ambulatory Visit: Payer: Self-pay | Admitting: *Deleted

## 2021-09-05 MED ORDER — APIXABAN 5 MG PO TABS
ORAL_TABLET | ORAL | 5 refills | Status: DC
  Filled 2021-09-05: qty 60, 30d supply, fill #0
  Filled 2021-10-05: qty 60, 30d supply, fill #1
  Filled 2021-11-03: qty 60, 30d supply, fill #2

## 2021-09-05 NOTE — Telephone Encounter (Signed)
Patient daughter requested refill.  

## 2021-09-21 ENCOUNTER — Other Ambulatory Visit (HOSPITAL_COMMUNITY): Payer: Self-pay

## 2021-10-05 ENCOUNTER — Other Ambulatory Visit (HOSPITAL_COMMUNITY): Payer: Self-pay

## 2021-10-25 ENCOUNTER — Other Ambulatory Visit (HOSPITAL_COMMUNITY): Payer: Self-pay

## 2021-10-26 ENCOUNTER — Other Ambulatory Visit (HOSPITAL_COMMUNITY): Payer: Self-pay

## 2021-10-26 ENCOUNTER — Telehealth (INDEPENDENT_AMBULATORY_CARE_PROVIDER_SITE_OTHER): Payer: HMO | Admitting: Family

## 2021-10-26 ENCOUNTER — Encounter: Payer: Self-pay | Admitting: Family

## 2021-10-26 DIAGNOSIS — R600 Localized edema: Secondary | ICD-10-CM

## 2021-10-26 DIAGNOSIS — L03116 Cellulitis of left lower limb: Secondary | ICD-10-CM

## 2021-10-26 MED ORDER — DOXYCYCLINE HYCLATE 100 MG PO TABS: 100.0000 mg | ORAL_TABLET | Freq: Two times a day (BID) | ORAL | 0 refills | Status: AC

## 2021-10-26 NOTE — Progress Notes (Signed)
?This service is provided via telemedicine ? ?No vital signs collected/recorded due to the encounter was a telemedicine visit.  ? ?Location of patient (ex: home, work):  Home. ? ?Patient consents to a telephone visit:  Yes. ? ?Location of the provider (ex: office, home):  [employer]. ? ?Name of any referring provider:  Niall Illes, Nelda Bucks, NP  ? ?Names of all persons participating in the telemedicine service and their role in the encounter:  Patient, Daughter Wells Guiles, Heriberto [place]ig[place], [place], [place], Webb Silversmith, NP.   ? ?Time spent on call: 8 minutes spent on the phone with Medical Assistant.  ? ? ?Location:    ?  ?Place of Service:    ?Provider: Marlowe Sax FNP-C ? ?Zyen Triggs, Nelda Bucks, NP ? ?Patient Care Team: ?Edrie Ehrich, Nelda Bucks, NP as PCP - General (Family Medicine) ?Constance Haw, MD as PCP - Cardiology (Cardiology) ?Tomasita Morrow, NP as Nurse Practitioner (Cardiology) ?Deneise Lever, MD as Consulting Physician (Pulmonary Disease) ?Kristeen Miss, MD as Consulting Physician (Neurosurgery) ?Ronny Bacon, MD as Referring Physician (Surgery) ?[name], Fransico Him., MD (General Surgery) ?Bo Merino, MD as Consulting Physician (Rheumatology) ?Clent Jacks, MD as Consulting Physician (Ophthalmology) ? ?Extended Emergency Contact Information ?Primary Emergency Contact: [name],[name] ?Mobile Phone: [phone] ?Relation: Son ?Secondary Emergency Contact: [name],[name] ?Mobile Phone: [phone] ?Relation: Daughter ? ?Code Status:  DNR ?Goals of care: Advanced Directive information ? ?  10/26/2021  ?  3:40 PM  ?Advanced Directives  ?Does Patient Have a Medical Advance Directive? No  ?Does patient want to make changes to medical advance directive? No - Patient declined  ? ? ? ?Chief Complaint  ?Patient presents with  ? Acute Visit  ?  Patient daughter Wells Guiles states that patient feet have been swollen for 3 weeks.   ? ? ?HPI:  ?Pt is a 86 y.o. male seen today for an acute visit for  evaluation of swelling and pain on lower extremities for 3 weeks.  He is seen on video with assistance from her daughter Wells Guiles.  Patient states was previously treated with similar symptoms when he had infection on the leg.  He requests same antibiotics to be sent to the pharmacy to help with the infection.  He has been taking his diuretic without skipping.  Does not think that he has gained a little weight but not much he denies any change in the shortness of breath or cough.  Also denies any fever chills or drainage from the legs.  Has noticed redness from.  Previous infection site. ? ? ?Past Medical History:  ?Diagnosis Date  ? Anxiety state, unspecified   ? Atrial fibrillation ([place]) 03/06/2016  ? Chronic pain syndrome   ? Constipation   ? Depressive disorder, not elsewhere classified   ? Dysphagia, unspecified(787.20)   ? Eye exam normal 09/01/2019  ? Diabetic eye exam did not reveal any retinopathy.   ? Herpes simplex disease   ? Hyperlipidemia   ? Hypertension   ? Hypopotassemia   ? Insomnia, unspecified   ? Intestinovesical fistula   ? Malignant neoplasm of prostate ([place])   ? Myalgia and myositis, unspecified   ? Osteoarthritis   ? Pain in joint, lower leg   ? Bilateral knee pains  ? Reflux esophagitis   ? Rheumatoid arthritis with rheumatoid factor (HCC)   ? Spinal stenosis, unspecified region other than cervical   ? Thrombocytopenia, unspecified ([place])   ? Type II or unspecified type diabetes mellitus without mention of complication, not stated as uncontrolled   ?  Pt states that it is prediabetic  ? Unspecified arthropathy, pelvic region and thigh   ? Unspecified hereditary and idiopathic peripheral neuropathy   ? ?Past Surgical History:  ?Procedure Laterality Date  ? CARDIAC CATHETERIZATION  01/31/2004  ? Dr Gwenlyn Found  ? CARDIOVERSION N/A 07/12/2016  ? Procedure: CARDIOVERSION;  Surgeon: Pixie Casino, MD;  Location: [facility];  Service: Cardiovascular;  Laterality: N/A;  ? COLONOSCOPY  08/09/2006  ? Dr  Christian Mate, hemorrhoids/rectal fistula  ? cystocopy  07/2006  ? Dr [name]  ? NASAL SINUS SURGERY  1992  ? PROSTATE SURGERY  1991  ? TOTAL HIP ARTHROPLASTY Right 02/20/2018  ? Procedure: RIGHT TOTAL HIP ARTHROPLASTY ANTERIOR APPROACH;  Surgeon: Mcarthur Rossetti, MD;  Location: [facility];  Service: Orthopedics;  Laterality: Right;  ? ? ?No Known Allergies ? ?Outpatient Encounter Medications as of 10/26/2021  ?Medication Sig  ? apixaban (ELIQUIS) 5 MG TABS tablet TAKE 1 TABLET BY MOUTH 2 TIMES DAILY TO HELP STROKE RELATED TO ATRIAL FIBRILLATION  ? cromolyn (OPTICROM) 4 % ophthalmic solution Place 1 drop into both eyes 4 (four) times daily.  ? furosemide (LASIX) 40 MG tablet Take one tablet by mouth twice daily.  ? moxifloxacin (VIGAMOX) 0.5 % ophthalmic solution Instill 1 drop into right eye four times a day  ? polyethylene glycol (MIRALAX / GLYCOLAX) 17 g packet Take 17 g by mouth daily.  ? potassium chloride SA (KLOR-CON) 20 MEQ tablet TAKE 2 TABLETS BY MOUTH DAILY WHEN TAKING LASIX (FUROSMIDE)  ? ?No facility-administered encounter medications on file as of 10/26/2021.  ? ? ?Review of Systems  ?Constitutional:  Negative for appetite change, chills, fatigue, fever and unexpected weight change.  ?HENT:  Negative for sore throat, tinnitus and trouble swallowing.   ?Respiratory:  Negative for cough, chest tightness, shortness of breath and wheezing.   ?Cardiovascular:  Positive for leg swelling. Negative for chest pain and palpitations.  ?Gastrointestinal:  Negative for abdominal distention, abdominal pain, blood in stool, constipation, diarrhea, nausea and vomiting.  ?Skin:  Negative for color change, pallor, rash and wound.  ?     Redness on lower extremity previous infection ?.  ? ?Immunization History  ?Administered Date(s) Administered  ? Fluad Quad(high Dose 65+) 10/19/2019  ? Influenza, High Dose Seasonal PF 04/18/2017, 05/12/2018  ? Influenza,inj,Quad PF,6+ Mos 06/21/2015  ? Pneumococcal Conjugate-13 06/21/2015   ? Pneumococcal Polysaccharide-23 11/15/2016  ? ?Pertinent  Health Maintenance Due  ?Topic Date Due  ? OPHTHALMOLOGY EXAM  08/31/2020  ? FOOT EXAM  05/02/2021  ? HEMOGLOBIN A1C  11/20/2021  ? URINE MICROALBUMIN  05/23/2022  ? INFLUENZA VACCINE  Discontinued  ? ? ?  08/08/2020  ?  2:50 PM 09/08/2020  ?  2:33 PM 01/04/2021  ? 10:34 AM 05/23/2021  ?  1:29 PM 10/26/2021  ?  3:40 PM  ?Fall Risk  ?Falls in the past year? 0 0 0 0 0  ?Was there an injury with Fall? 0 0 0 0 0  ?Fall Risk Category Calculator 0 0 0 0 0  ?Fall Risk Category Low Low Low Low Low  ?Patient Fall Risk Level Low fall risk Low fall risk Low fall risk Low fall risk Low fall risk  ?Patient at Risk for Falls Due to    No Fall Risks No Fall Risks  ?Fall risk Follow up    Falls evaluation completed Falls evaluation completed  ? ?Functional Status Survey: ?  ? ?There were no vitals filed for this visit. ?There is no  height or weight on file to calculate BMI. ?Physical Exam ?Constitutional:   ?   General: He is not in acute distress. ?   Appearance: He is not ill-appearing.  ?Pulmonary:  ?   Effort: Pulmonary effort is normal.  ?Musculoskeletal:  ?   Right lower leg: Edema present.  ?   Left lower leg: Edema present.  ?Skin: ?   Findings: Erythema present.  ?   Comments: Erythema to shin area lower extremity no drainage noted.  ?Neurological:  ?   Mental Status: He is alert.  ?Psychiatric:     ?   Mood and Affect: Mood normal.     ?   Behavior: Behavior normal.  ? ? ?Labs reviewed: ?Recent Labs  ?  12/28/20 ?1114 05/23/21 ?1435  ?NA 141 141  ?K 3.5 4.0  ?CL 103 104  ?CO2 27 28  ?GLUCOSE 100* 108  ?BUN 21 14  ?CREATININE 1.20* 1.45*  ?CALCIUM 9.0 8.9  ? ?Recent Labs  ?  12/28/20 ?1114 05/23/21 ?1435  ?AST 19 17  ?ALT 15 9  ?BILITOT 1.3* 1.3*  ?PROT 6.5 7.0  ? ?Recent Labs  ?  12/28/20 ?1114 05/23/21 ?1435  ?WBC 6.8 6.7  ?NEUTROABS 4,114 3,457  ?HGB 11.0* 9.6*  ?HCT 33.0* 29.4*  ?MCV 92.7 93.3  ?PLT 155 166  ? ?Lab Results  ?Component Value Date  ? TSH 2.86  05/23/2021  ? ?Lab Results  ?Component Value Date  ? HGBA1C 6.0 (H) 05/23/2021  ? ?Lab Results  ?Component Value Date  ? CHOL 134 05/23/2021  ? HDL 49 05/23/2021  ? D'Lo 72 05/23/2021  ? TRIG 46 05/23/2021  ? CHO

## 2021-10-27 ENCOUNTER — Other Ambulatory Visit (HOSPITAL_COMMUNITY): Payer: Self-pay

## 2021-10-31 ENCOUNTER — Other Ambulatory Visit (HOSPITAL_COMMUNITY): Payer: Self-pay

## 2021-11-02 ENCOUNTER — Other Ambulatory Visit (HOSPITAL_COMMUNITY): Payer: Self-pay

## 2021-11-03 ENCOUNTER — Other Ambulatory Visit (HOSPITAL_COMMUNITY): Payer: Self-pay

## 2021-11-16 ENCOUNTER — Telehealth: Payer: Self-pay | Admitting: *Deleted

## 2021-11-16 NOTE — Telephone Encounter (Signed)
Need to re-evaluate prior to prescribing another antibiotics. ?

## 2021-11-16 NOTE — Telephone Encounter (Signed)
Ronnie Jackson, daughter called and stated that patient was seen 10/26/2021 and given Doxycyline and it is not helping his feet/legs. Patient is wondering if you could prescribe something stronger before the weekend.  ? ?Please Advise.  ? ?

## 2021-11-17 NOTE — Telephone Encounter (Signed)
Called and spoke with Chong Sicilian, she stated that patient has an appointment on Monday.  ?I offered an appointment for today but refused and stated that the patient wanted to wait till Monday.  ?

## 2021-11-17 NOTE — Telephone Encounter (Signed)
Noted  

## 2021-11-20 ENCOUNTER — Encounter: Payer: Self-pay | Admitting: Family

## 2021-11-20 ENCOUNTER — Ambulatory Visit (INDEPENDENT_AMBULATORY_CARE_PROVIDER_SITE_OTHER): Payer: HMO | Admitting: Family

## 2021-11-20 VITALS — BP 124/62 | HR 65 | Temp 97.9°F | Resp 14 | Ht 70.0 in

## 2021-11-20 DIAGNOSIS — E1142 Type 2 diabetes mellitus with diabetic polyneuropathy: Secondary | ICD-10-CM | POA: Diagnosis not present

## 2021-11-20 DIAGNOSIS — E7800 Pure hypercholesterolemia, unspecified: Secondary | ICD-10-CM | POA: Diagnosis not present

## 2021-11-20 DIAGNOSIS — R600 Localized edema: Secondary | ICD-10-CM | POA: Diagnosis not present

## 2021-11-20 DIAGNOSIS — I4819 Other persistent atrial fibrillation: Secondary | ICD-10-CM | POA: Diagnosis not present

## 2021-11-20 MED ORDER — FUROSEMIDE 40 MG PO TABS: ORAL_TABLET | ORAL | 5 refills | Status: DC

## 2021-11-20 NOTE — Patient Instructions (Signed)
Furosemide 40 mg tablet Take Two tablet by mouth in the morning and one tablet by mouth in the afternoon daily.x 3 days then resume one by tablet by mouth twice daily.  ?

## 2021-11-20 NOTE — Progress Notes (Signed)
? ?Provider: Marlowe Sax FNP-C  ? ?Dionel Archey, Nelda Bucks, NP ? ?Patient Care Team: ?Mayla Biddy, Nelda Bucks, NP as PCP - General (Family Medicine) ?Constance Haw, MD as PCP - Cardiology (Cardiology) ?Tomasita Morrow, NP as Nurse Practitioner (Cardiology) ?Deneise Lever, MD as Consulting Physician (Pulmonary Disease) ?Kristeen Miss, MD as Consulting Physician (Neurosurgery) ?Ronny Bacon, MD as Referring Physician (Surgery) ?[name], Fransico Him., MD (General Surgery) ?Bo Merino, MD as Consulting Physician (Rheumatology) ?Clent Jacks, MD as Consulting Physician (Ophthalmology) ? ?Extended Emergency Contact Information ?Primary Emergency Contact: [name],[name] ?Mobile Phone: [phone] ?Relation: Son ?Secondary Emergency Contact: [name],[name] ?Mobile Phone: [phone] ?Relation: Daughter ? ?Code Status:  Full Code  ?Goals of care: Advanced Directive information ? ?  10/26/2021  ?  3:40 PM  ?Advanced Directives  ?Does Patient Have a Medical Advance Directive? No  ?Does patient want to make changes to medical advance directive? No - Patient declined  ? ? ? ?Chief Complaint  ?Patient presents with  ? Medical Management of Chronic Issues  ?  Patient presents today for a 6 month follow-up.Complains of feet swollen  ? Quality Metric Gaps  ?  Eye & foot exam, A1C  ? ? ?HPI:  ?Pt is a 86 y.o. male seen today for 6 months follow up medical management of chronic diseases.   ?Has some medical history of type 2 diabetes with neuropathy, hypercholesterolemia, arterial fibrillation, bilateral lower extremity edema, constipation among other conditions. ?He denies any fall episode.Unable to weigh this visit weighing machine broken. ? ?Past Medical History:  ?Diagnosis Date  ? Anxiety state, unspecified   ? Atrial fibrillation ([place]) 03/06/2016  ? Chronic pain syndrome   ? Constipation   ? Depressive disorder, not elsewhere classified   ? Dysphagia, unspecified(787.20)   ? Eye exam normal 09/01/2019  ? Diabetic  eye exam did not reveal any retinopathy.   ? Herpes simplex disease   ? Hyperlipidemia   ? Hypertension   ? Hypopotassemia   ? Insomnia, unspecified   ? Intestinovesical fistula   ? Malignant neoplasm of prostate ([place])   ? Myalgia and myositis, unspecified   ? Osteoarthritis   ? Pain in joint, lower leg   ? Bilateral knee pains  ? Reflux esophagitis   ? Rheumatoid arthritis with rheumatoid factor (HCC)   ? Spinal stenosis, unspecified region other than cervical   ? Thrombocytopenia, unspecified ([place])   ? Type II or unspecified type diabetes mellitus without mention of complication, not stated as uncontrolled   ? Pt states that it is prediabetic  ? Unspecified arthropathy, pelvic region and thigh   ? Unspecified hereditary and idiopathic peripheral neuropathy   ? ?Past Surgical History:  ?Procedure Laterality Date  ? CARDIAC CATHETERIZATION  01/31/2004  ? Dr Gwenlyn Found  ? CARDIOVERSION N/A 07/12/2016  ? Procedure: CARDIOVERSION;  Surgeon: Pixie Casino, MD;  Location: [facility];  Service: Cardiovascular;  Laterality: N/A;  ? COLONOSCOPY  08/09/2006  ? Dr Christian Mate, hemorrhoids/rectal fistula  ? cystocopy  07/2006  ? Dr [name]  ? NASAL SINUS SURGERY  1992  ? PROSTATE SURGERY  1991  ? TOTAL HIP ARTHROPLASTY Right 02/20/2018  ? Procedure: RIGHT TOTAL HIP ARTHROPLASTY ANTERIOR APPROACH;  Surgeon: Mcarthur Rossetti, MD;  Location: [facility];  Service: Orthopedics;  Laterality: Right;  ? ? ?No Known Allergies ? ?Allergies as of 11/20/2021   ?No Known Allergies ?  ? ?  ?Medication List  ?  ? ?  ? Accurate as of Nov 20, 2021  1:13  PM. If you have any questions, ask your nurse or doctor.  ?  ?  ? ?  ? ?cromolyn 4 % ophthalmic solution ?Commonly known as: OPTICROM ?Place 1 drop into both eyes 4 (four) times daily. ?  ?Eliquis 5 MG Tabs tablet ?Generic drug: apixaban ?TAKE 1 TABLET BY MOUTH 2 TIMES DAILY TO HELP STROKE RELATED TO ATRIAL FIBRILLATION ?  ?furosemide 40 MG tablet ?Commonly known as: LASIX ?Take one tablet by mouth  twice daily. ?  ?moxifloxacin 0.5 % ophthalmic solution ?Commonly known as: VIGAMOX ?Instill 1 drop into right eye four times a day ?  ?polyethylene glycol 17 g packet ?Commonly known as: MIRALAX / GLYCOLAX ?Take 17 g by mouth daily. ?  ?potassium chloride SA 20 MEQ tablet ?Commonly known as: KLOR-CON M ?TAKE 2 TABLETS BY MOUTH DAILY WHEN TAKING LASIX (FUROSMIDE) ?  ? ?  ? ? ?Review of Systems  ?Constitutional:  Negative for appetite change, chills, fatigue, fever and unexpected weight change.  ?HENT:  Negative for congestion, dental problem, ear discharge, ear pain, facial swelling, hearing loss, nosebleeds, postnasal drip, rhinorrhea, sinus pressure, sinus pain, sneezing, sore throat, tinnitus and trouble swallowing.   ?Eyes:  Negative for pain, discharge, redness, itching and visual disturbance.  ?Respiratory:  Negative for cough, chest tightness, shortness of breath and wheezing.   ?Cardiovascular:  Positive for leg swelling. Negative for chest pain and palpitations.  ?Gastrointestinal:  Negative for abdominal distention, abdominal pain, blood in stool, constipation, diarrhea, nausea and vomiting.  ?Endocrine: Negative for cold intolerance, heat intolerance, polydipsia, polyphagia and polyuria.  ?Genitourinary:  Negative for difficulty urinating, dysuria, flank pain, frequency and urgency.  ?Musculoskeletal:  Negative for arthralgias, back pain, gait problem, joint swelling, myalgias, neck pain and neck stiffness.  ?Skin:  Negative for color change, pallor, rash and wound.  ?Neurological:  Negative for dizziness, syncope, speech difficulty, weakness, light-headedness, numbness and headaches.  ?Hematological:  Does not bruise/bleed easily.  ?Psychiatric/Behavioral:  Negative for agitation, behavioral problems, confusion, hallucinations, self-injury, sleep disturbance and suicidal ideas. The patient is not nervous/anxious.   ? ?Immunization History  ?Administered Date(s) Administered  ? Fluad Quad(high Dose 65+)  10/19/2019  ? Influenza, High Dose Seasonal PF 04/18/2017, 05/12/2018  ? Influenza,inj,Quad PF,6+ Mos 06/21/2015  ? Pneumococcal Conjugate-13 06/21/2015  ? Pneumococcal Polysaccharide-23 11/15/2016  ? ?Pertinent  Health Maintenance Due  ?Topic Date Due  ? OPHTHALMOLOGY EXAM  08/31/2020  ? FOOT EXAM  05/02/2021  ? HEMOGLOBIN A1C  11/20/2021  ? URINE MICROALBUMIN  05/23/2022  ? INFLUENZA VACCINE  Discontinued  ? ? ?  08/08/2020  ?  2:50 PM 09/08/2020  ?  2:33 PM 01/04/2021  ? 10:34 AM 05/23/2021  ?  1:29 PM 10/26/2021  ?  3:40 PM  ?Fall Risk  ?Falls in the past year? 0 0 0 0 0  ?Was there an injury with Fall? 0 0 0 0 0  ?Fall Risk Category Calculator 0 0 0 0 0  ?Fall Risk Category Low Low Low Low Low  ?Patient Fall Risk Level Low fall risk Low fall risk Low fall risk Low fall risk Low fall risk  ?Patient at Risk for Falls Due to    No Fall Risks No Fall Risks  ?Fall risk Follow up    Falls evaluation completed Falls evaluation completed  ? ?Functional Status Survey: ?  ? ?Vitals:  ? 11/20/21 1302  ?BP: 124/62  ?Pulse: 65  ?Resp: 14  ?Temp: 97.9 ?F (36.6 ?C)  ?TempSrc: Skin  ?SpO2: 93%  ?  Height: '5\' 10"'$  (1.778 m)  ? ?Body mass index is 25.63 kg/m?Marland Kitchen ?Physical Exam ?Vitals reviewed.  ?Constitutional:   ?   General: He is not in acute distress. ?   Appearance: Normal appearance. He is normal weight. He is not ill-appearing or diaphoretic.  ?HENT:  ?   Head: Normocephalic.  ?   Right Ear: Tympanic membrane, ear canal and external ear normal. There is no impacted cerumen.  ?   Left Ear: Tympanic membrane, ear canal and external ear normal. There is no impacted cerumen.  ?   Nose: Nose normal. No congestion or rhinorrhea.  ?   Mouth/Throat:  ?   Mouth: Mucous membranes are moist.  ?   Pharynx: Oropharynx is clear. No oropharyngeal exudate or posterior oropharyngeal erythema.  ?Eyes:  ?   General: No scleral icterus.    ?   Right eye: No discharge.     ?   Left eye: No discharge.  ?   Extraocular Movements: Extraocular movements  intact.  ?   Conjunctiva/sclera: Conjunctivae normal.  ?   Pupils: Pupils are equal, round, and reactive to light.  ?Neck:  ?   Vascular: No carotid bruit.  ?Cardiovascular:  ?   Rate and Rhythm: Normal r

## 2021-11-28 ENCOUNTER — Other Ambulatory Visit: Payer: HMO

## 2021-11-28 DIAGNOSIS — E1142 Type 2 diabetes mellitus with diabetic polyneuropathy: Secondary | ICD-10-CM | POA: Diagnosis not present

## 2021-11-28 DIAGNOSIS — E7800 Pure hypercholesterolemia, unspecified: Secondary | ICD-10-CM | POA: Diagnosis not present

## 2021-11-28 DIAGNOSIS — I4819 Other persistent atrial fibrillation: Secondary | ICD-10-CM

## 2021-11-29 ENCOUNTER — Observation Stay (HOSPITAL_COMMUNITY)
Admission: EM | Admit: 2021-11-29 | Discharge: 2021-12-01 | Disposition: A | Discharge status: Home / Self Care | Payer: HMO | Attending: Family Medicine | Admitting: Family Medicine

## 2021-11-29 ENCOUNTER — Telehealth: Payer: Self-pay | Admitting: Family

## 2021-11-29 ENCOUNTER — Other Ambulatory Visit: Payer: Self-pay

## 2021-11-29 ENCOUNTER — Encounter (HOSPITAL_COMMUNITY): Payer: Self-pay

## 2021-11-29 DIAGNOSIS — Z8546 Personal history of malignant neoplasm of prostate: Secondary | ICD-10-CM | POA: Diagnosis not present

## 2021-11-29 DIAGNOSIS — I129 Hypertensive chronic kidney disease with stage 1 through stage 4 chronic kidney disease, or unspecified chronic kidney disease: Secondary | ICD-10-CM | POA: Diagnosis not present

## 2021-11-29 DIAGNOSIS — D649 Anemia, unspecified: Secondary | ICD-10-CM | POA: Diagnosis present

## 2021-11-29 DIAGNOSIS — D509 Iron deficiency anemia, unspecified: Secondary | ICD-10-CM | POA: Diagnosis not present

## 2021-11-29 DIAGNOSIS — I4891 Unspecified atrial fibrillation: Secondary | ICD-10-CM | POA: Diagnosis not present

## 2021-11-29 DIAGNOSIS — I4819 Other persistent atrial fibrillation: Secondary | ICD-10-CM | POA: Diagnosis not present

## 2021-11-29 DIAGNOSIS — E1142 Type 2 diabetes mellitus with diabetic polyneuropathy: Secondary | ICD-10-CM | POA: Diagnosis not present

## 2021-11-29 DIAGNOSIS — E1140 Type 2 diabetes mellitus with diabetic neuropathy, unspecified: Secondary | ICD-10-CM | POA: Insufficient documentation

## 2021-11-29 DIAGNOSIS — R609 Edema, unspecified: Secondary | ICD-10-CM | POA: Diagnosis present

## 2021-11-29 DIAGNOSIS — E876 Hypokalemia: Secondary | ICD-10-CM | POA: Diagnosis present

## 2021-11-29 DIAGNOSIS — N1832 Chronic kidney disease, stage 3b: Secondary | ICD-10-CM | POA: Insufficient documentation

## 2021-11-29 DIAGNOSIS — E1122 Type 2 diabetes mellitus with diabetic chronic kidney disease: Secondary | ICD-10-CM | POA: Insufficient documentation

## 2021-11-29 DIAGNOSIS — R531 Weakness: Secondary | ICD-10-CM | POA: Diagnosis not present

## 2021-11-29 DIAGNOSIS — Z79899 Other long term (current) drug therapy: Secondary | ICD-10-CM | POA: Insufficient documentation

## 2021-11-29 DIAGNOSIS — R195 Other fecal abnormalities: Secondary | ICD-10-CM | POA: Insufficient documentation

## 2021-11-29 DIAGNOSIS — K2950 Unspecified chronic gastritis without bleeding: Secondary | ICD-10-CM | POA: Insufficient documentation

## 2021-11-29 DIAGNOSIS — Z96641 Presence of right artificial hip joint: Secondary | ICD-10-CM | POA: Diagnosis not present

## 2021-11-29 DIAGNOSIS — Z7901 Long term (current) use of anticoagulants: Secondary | ICD-10-CM | POA: Insufficient documentation

## 2021-11-29 DIAGNOSIS — E7800 Pure hypercholesterolemia, unspecified: Secondary | ICD-10-CM

## 2021-11-29 DIAGNOSIS — E785 Hyperlipidemia, unspecified: Secondary | ICD-10-CM | POA: Diagnosis present

## 2021-11-29 LAB — CBC WITH DIFFERENTIAL/PLATELET
Abs Immature Granulocytes: 0.02 10*3/uL (ref 0.00–0.07)
Absolute Monocytes: 443 cells/uL (ref 200–950)
Basophils Absolute: 30 cells/uL (ref 0–200)
Basophils Absolute: 0 10*3/uL (ref 0.0–0.1)
Basophils Relative: 0.5 %
Basophils Relative: 1 %
Eosinophils Absolute: 89 cells/uL (ref 15–500)
Eosinophils Absolute: 0.1 10*3/uL (ref 0.0–0.5)
Eosinophils Relative: 1.5 %
Eosinophils Relative: 1 %
HCT: 22 % — ABNORMAL LOW (ref 38.5–50.0)
HCT: 22.5 % — ABNORMAL LOW (ref 39.0–52.0)
Hemoglobin: 6.6 g/dL — ABNORMAL LOW (ref 13.2–17.1)
Hemoglobin: 6.6 g/dL — CL (ref 13.0–17.0)
Immature Granulocytes: 0 %
Lymphocytes Relative: 29 %
Lymphs Abs: 2148 cells/uL (ref 850–3900)
Lymphs Abs: 1.9 10*3/uL (ref 0.7–4.0)
MCH: 25.2 pg — ABNORMAL LOW (ref 27.0–33.0)
MCH: 25.1 pg — ABNORMAL LOW (ref 26.0–34.0)
MCHC: 30 g/dL — ABNORMAL LOW (ref 32.0–36.0)
MCHC: 29.3 g/dL — ABNORMAL LOW (ref 30.0–36.0)
MCV: 84 fL (ref 80.0–100.0)
MCV: 85.6 fL (ref 80.0–100.0)
MPV: 11.3 fL (ref 7.5–12.5)
Monocytes Absolute: 0.5 10*3/uL (ref 0.1–1.0)
Monocytes Relative: 7.5 %
Monocytes Relative: 7 %
Neutro Abs: 3192 cells/uL (ref 1500–7800)
Neutro Abs: 4 10*3/uL (ref 1.7–7.7)
Neutrophils Relative %: 54.1 %
Neutrophils Relative %: 62 %
Platelets: 206 10*3/uL (ref 140–400)
Platelets: 213 10*3/uL (ref 150–400)
RBC: 2.62 10*6/uL — ABNORMAL LOW (ref 4.20–5.80)
RBC: 2.63 MIL/uL — ABNORMAL LOW (ref 4.22–5.81)
RDW: 14.8 % (ref 11.0–15.0)
RDW: 16.5 % — ABNORMAL HIGH (ref 11.5–15.5)
Total Lymphocyte: 36.4 %
WBC: 5.9 10*3/uL (ref 3.8–10.8)
WBC: 6.5 10*3/uL (ref 4.0–10.5)
nRBC: 0 % (ref 0.0–0.2)

## 2021-11-29 LAB — COMPREHENSIVE METABOLIC PANEL
ALT: 13 U/L (ref 0–44)
AST: 21 U/L (ref 15–41)
Albumin: 4.2 g/dL (ref 3.5–5.0)
Alkaline Phosphatase: 59 U/L (ref 38–126)
Anion gap: 9 (ref 5–15)
BUN: 26 mg/dL — ABNORMAL HIGH (ref 8–23)
CO2: 24 mmol/L (ref 22–32)
Calcium: 8.7 mg/dL — ABNORMAL LOW (ref 8.9–10.3)
Chloride: 110 mmol/L (ref 98–111)
Creatinine, Ser: 1.58 mg/dL — ABNORMAL HIGH (ref 0.61–1.24)
GFR, Estimated: 43 mL/min — ABNORMAL LOW (ref >60)
Glucose, Bld: 97 mg/dL (ref 70–99)
Potassium: 3.2 mmol/L — ABNORMAL LOW (ref 3.5–5.1)
Sodium: 143 mmol/L (ref 135–145)
Total Bilirubin: 0.9 mg/dL (ref 0.3–1.2)
Total Protein: 7.6 g/dL (ref 6.5–8.1)

## 2021-11-29 LAB — COMPLETE METABOLIC PANEL WITH GFR
AG Ratio: 1.8 (calc) (ref 1.0–2.5)
ALT: 9 U/L (ref 9–46)
AST: 16 U/L (ref 10–35)
Albumin: 4.4 g/dL (ref 3.6–5.1)
Alkaline phosphatase (APISO): 59 U/L (ref 35–144)
BUN/Creatinine Ratio: 15 (calc) (ref 6–22)
BUN: 26 mg/dL — ABNORMAL HIGH (ref 7–25)
CO2: 26 mmol/L (ref 20–32)
Calcium: 8.6 mg/dL (ref 8.6–10.3)
Chloride: 107 mmol/L (ref 98–110)
Creat: 1.77 mg/dL — ABNORMAL HIGH (ref 0.70–1.22)
Globulin: 2.4 g/dL (calc) (ref 1.9–3.7)
Glucose, Bld: 105 mg/dL — ABNORMAL HIGH (ref 65–99)
Potassium: 4.5 mmol/L (ref 3.5–5.3)
Sodium: 142 mmol/L (ref 135–146)
Total Bilirubin: 0.9 mg/dL (ref 0.2–1.2)
Total Protein: 6.8 g/dL (ref 6.1–8.1)
eGFR: 37 mL/min/{1.73_m2} — ABNORMAL LOW (ref >=60)

## 2021-11-29 LAB — MAGNESIUM: Magnesium: 2.4 mg/dL (ref 1.7–2.4)

## 2021-11-29 LAB — LIPID PANEL
Cholesterol: 124 mg/dL (ref <200)
HDL: 47 mg/dL (ref >=40)
LDL Cholesterol (Calc): 65 mg/dL (calc)
Non-HDL Cholesterol (Calc): 77 mg/dL (calc) (ref <130)
Total CHOL/HDL Ratio: 2.6 (calc) (ref <5.0)
Triglycerides: 50 mg/dL (ref <150)

## 2021-11-29 LAB — PREPARE RBC (CROSSMATCH)

## 2021-11-29 LAB — FERRITIN: Ferritin: 7 ng/mL — ABNORMAL LOW (ref 24–336)

## 2021-11-29 LAB — HEMOGLOBIN A1C
Hgb A1c MFr Bld: 6.1 % of total Hgb — ABNORMAL HIGH (ref <5.7)
Mean Plasma Glucose: 128 mg/dL
eAG (mmol/L): 7.1 mmol/L

## 2021-11-29 LAB — RETICULOCYTES
Immature Retic Fract: 24.6 % — ABNORMAL HIGH (ref 2.3–15.9)
RBC.: 2.65 MIL/uL — ABNORMAL LOW (ref 4.22–5.81)
Retic Count, Absolute: 33.4 10*3/uL (ref 19.0–186.0)
Retic Ct Pct: 1.3 % (ref 0.4–3.1)

## 2021-11-29 LAB — CBG MONITORING, ED: Glucose-Capillary: 90 mg/dL (ref 70–99)

## 2021-11-29 LAB — FOLATE: Folate: 7.9 ng/mL (ref >5.9)

## 2021-11-29 LAB — POC OCCULT BLOOD, ED: Fecal Occult Bld: POSITIVE — AB

## 2021-11-29 LAB — PROTIME-INR
INR: 1.4 — ABNORMAL HIGH (ref 0.8–1.2)
Prothrombin Time: 17.3 seconds — ABNORMAL HIGH (ref 11.4–15.2)

## 2021-11-29 LAB — TSH
TSH: 3.6 mIU/L (ref 0.40–4.50)
TSH: 1.651 u[IU]/mL (ref 0.350–4.500)

## 2021-11-29 LAB — VITAMIN B12: Vitamin B-12: 270 pg/mL (ref 180–914)

## 2021-11-29 LAB — IRON AND TIBC
Iron: 16 ug/dL — ABNORMAL LOW (ref 45–182)
Saturation Ratios: 3 % — ABNORMAL LOW (ref 17.9–39.5)
TIBC: 525 ug/dL — ABNORMAL HIGH (ref 250–450)
UIBC: 509 ug/dL

## 2021-11-29 LAB — PHOSPHORUS: Phosphorus: 3.3 mg/dL (ref 2.5–4.6)

## 2021-11-29 MED ORDER — SODIUM CHLORIDE 0.9 % IV SOLN: 250.0000 mL | INTRAVENOUS | Status: DC | PRN

## 2021-11-29 MED ORDER — ACETAMINOPHEN 325 MG PO TABS: 650.0000 mg | ORAL_TABLET | Freq: Four times a day (QID) | ORAL | Status: DC | PRN

## 2021-11-29 MED ORDER — ACETAMINOPHEN 650 MG RE SUPP: 650.0000 mg | Freq: Four times a day (QID) | RECTAL | Status: DC | PRN

## 2021-11-29 MED ORDER — SODIUM CHLORIDE 0.9% FLUSH
3.0000 mL | Freq: Two times a day (BID) | INTRAVENOUS | Status: DC
  Administered 2021-11-30 – 2021-12-01 (×4): 3 mL via INTRAVENOUS

## 2021-11-29 MED ORDER — SODIUM CHLORIDE 0.9 % IV SOLN
10.0000 mL/h | Freq: Once | INTRAVENOUS | Status: AC
  Administered 2021-11-29: 10 mL/h via INTRAVENOUS

## 2021-11-29 MED ORDER — SODIUM CHLORIDE 0.9% IV SOLUTION: Freq: Once | INTRAVENOUS | Status: AC

## 2021-11-29 MED ORDER — PANTOPRAZOLE SODIUM 40 MG IV SOLR
40.0000 mg | Freq: Two times a day (BID) | INTRAVENOUS | Status: DC
  Administered 2021-11-29 – 2021-12-01 (×4): 40 mg via INTRAVENOUS
  Filled 2021-11-29 (×4): qty 10

## 2021-11-29 MED ORDER — HYDROCODONE-ACETAMINOPHEN 5-325 MG PO TABS
1.0000 | ORAL_TABLET | ORAL | Status: DC | PRN
  Administered 2021-11-30: 1 via ORAL
  Filled 2021-11-29: qty 1

## 2021-11-29 MED ORDER — INSULIN ASPART 100 UNIT/ML IJ SOLN
0.0000 [IU] | INTRAMUSCULAR | Status: DC
  Administered 2021-11-30: 1 [IU] via SUBCUTANEOUS
  Filled 2021-11-29: qty 0.09

## 2021-11-29 MED ORDER — PANTOPRAZOLE 80MG IVPB - SIMPLE MED
80.0000 mg | Freq: Once | INTRAVENOUS | Status: AC
  Administered 2021-11-29: 80 mg via INTRAVENOUS
  Filled 2021-11-29 (×2): qty 100
  Filled 2021-11-29: qty 80

## 2021-11-29 MED ORDER — DOCUSATE SODIUM 100 MG PO CAPS
100.0000 mg | ORAL_CAPSULE | Freq: Two times a day (BID) | ORAL | Status: DC
  Administered 2021-11-30 (×2): 100 mg via ORAL
  Filled 2021-11-29 (×3): qty 1

## 2021-11-29 MED ORDER — SODIUM CHLORIDE 0.9% FLUSH: 3.0000 mL | INTRAVENOUS | Status: DC | PRN

## 2021-11-29 MED ORDER — FUROSEMIDE 40 MG PO TABS
40.0000 mg | ORAL_TABLET | Freq: Two times a day (BID) | ORAL | Status: DC
  Administered 2021-11-30 (×2): 40 mg via ORAL
  Filled 2021-11-29 (×3): qty 1

## 2021-11-29 MED ORDER — ALBUTEROL SULFATE (2.5 MG/3ML) 0.083% IN NEBU
2.5000 mg | INHALATION_SOLUTION | RESPIRATORY_TRACT | Status: DC | PRN
Start: 2021-11-29 — End: 2021-12-01
  Filled 2021-11-29: qty 3

## 2021-11-29 NOTE — ED Triage Notes (Signed)
Pt PCP called him and told him to come to ED for hemoglobin of 6. Pt c/o weakness, dizziness, fatigue.  ?

## 2021-11-29 NOTE — Assessment & Plan Note (Signed)
Chronic has been on Lasix ?But no known diagnosis of CHF.  Will check echogram as he have not had echogram for quite some time. ?Patient has significant fatigue make sure her EF is preserved. ?

## 2021-11-29 NOTE — Assessment & Plan Note (Signed)
Chronic stable not on statins ?

## 2021-11-29 NOTE — H&P (Signed)
? ? ?Ronnie Jackson NOM:[id number] DOB: 1936-04-26 DOA: 11/29/2021 ?  ?PCP: Ngetich, Nelda Bucks, NP   ?Outpatient Specialists:  ? Constance Haw, MD as PCP - Cardiology (Cardiology) ?Bo Merino, MD as Consulting Physician (Rheumatology) ?Patient arrived to ER on 11/29/21 at 1845 ?Referred by Attending Long, Wonda Olds, MD ? ? ?Patient coming from:   ? home Lives  With family ?   ?Chief Complaint:   ?Chief Complaint  ?Patient presents with  ? Abnormal Lab  ? ? ?HPI: ?Ronnie Jackson is a 86 y.o. male with medical history significant of a.fib on eliquis, DM2, HLD, constipation  ?   Rheumatoid arthritis with rheumatoid factor, Prostate cancer ?Presented with   low hg ?Has been weak for the past few weeks no melena no blood in stool ?He is on eliquis for a.fib has been compliant ?No Cp no SOB severe fatigue ?went to PCP hg was 6.6 ?They sent him to ER ?Last dose of Eliquis was 5/10 AM ?  He had hx of infection  ?He chews tobacco ?No Etoh ?At baseline has mild short memory deficits ? ?  ?Regarding pertinent Chronic problems:   ? ? Hyperlipidemia -  not on statins ?Lipid Panel  ?   ?Component Value Date/Time  ? CHOL 124 11/28/2021 0945  ? CHOL 161 07/01/2015 0944  ? TRIG 50 11/28/2021 0945  ? HDL 47 11/28/2021 0945  ? HDL 33 (L) 07/01/2015 0944  ? CHOLHDL 2.6 11/28/2021 0945  ? VLDL 26 02/06/2016 1227  ? Sunol 65 11/28/2021 0945  ? LABVLDL 33 07/01/2015 0944  ? ? ? HTN on Lasix no hx of CHF ?  ?   ?  DM 2 -  ?Lab Results  ?Component Value Date  ? HGBA1C 6.1 (H) 11/28/2021  ? diet controlled ?   ?  ? A. Fib -  - CHA2DS2 vas score   3   ? current  on anticoagulation with   Eliquis,  ?    ? CKD stage IIIa- baseline Cr  1.45 ?Estimated Creatinine Clearance: 30.1 mL/min (A) (by C-G formula based on SCr of 1.77 mg/dL (H)). ? ?Lab Results  ?Component Value Date  ? CREATININE 1.77 (H) 11/28/2021  ? CREATININE 1.45 (H) 05/23/2021  ? CREATININE 1.20 (H) 12/28/2020  ?      ? ? Chronic anemia - baseline hg Hemoglobin &  Hematocrit  ?Recent Labs  ?  12/28/20 ?1114 05/23/21 ?1435 11/28/21 ?0945  ?HGB 11.0* 9.6* 6.6*  ?  ? ?While in ER: ?  ? ?ER discussed with Gi  ?Hemoccult positive ?   ?Following Medications were ordered in ER: ?Medications  ?pantoprazole (PROTONIX) 80 mg /NS 100 mL IVPB (has no administration in time range)  ?  ?_______________________________________________________ ?ER Provider Called:     Dr. Heather Roberts ?They Recommend admit to medicine   ?Will see in AM    ?  ?ED Triage Vitals [11/29/21 1853]  ?Enc Vitals Group  ?   BP 131/80  ?   Pulse Rate 71  ?   Resp 18  ?   Temp 98 ?F (36.7 ?C)  ?   Temp Source Oral  ?   SpO2 100 %  ?   Weight 175 lb (79.4 kg)  ?   Height '5\' 8"'$  (1.727 m)  ?   Head Circumference   ?   Peak Flow   ?   Pain Score   ?   Pain Loc   ?   Pain  Edu?   ?   Excl. in [place]?   ?WYOV(78)@    ? _________________________________________ ?Significant initial  Findings: ?Abnormal Labs Reviewed  ?POC OCCULT BLOOD, ED - Abnormal; Notable for the following components:  ?    Result Value  ? Fecal Occult Bld POSITIVE (*)   ? All other components within normal limits  ?  ?ECG: Ordered ?Personally reviewed by me showing: ?HR : 66 ?Rhythm:  Atrial fibrillation ?Low voltage, precordial leads ?Nonspecific T abnormalities, lateral leads ?QTC 461 ?  ? ?The recent clinical data is shown below. ?Vitals:  ? 11/29/21 1853 11/29/21 1930  ?BP: 131/80 122/68  ?Pulse: 71 (!) 55  ?Resp: 18 19  ?Temp: 98 ?F (36.7 ?C)   ?TempSrc: Oral   ?SpO2: 100% 100%  ?Weight: 79.4 kg   ?Height: '5\' 8"'$  (1.727 m)   ?  ?WBC ? ?   ?Component Value Date/Time  ? WBC 5.9 11/28/2021 0945  ? LYMPHSABS 2,148 11/28/2021 0945  ? LYMPHSABS 2.2 07/01/2015 0944  ? LYMPHSABS 2.4 04/18/2010 1426  ? MONOABS 630 07/06/2016 1253  ? MONOABS 0.6 04/18/2010 1426  ? EOSABS 89 11/28/2021 0945  ? EOSABS 0.2 07/01/2015 0944  ? BASOSABS 30 11/28/2021 0945  ? BASOSABS 0.0 07/01/2015 0944  ? BASOSABS 0.0 04/18/2010 1426  ?  ? ? UA  ordered ?   ? ?Results for orders placed or  performed during the hospital encounter of 02/19/18  ?Surgical pcr screen     Status: None  ? Collection Time: 02/19/18  8:46 AM  ? Specimen: Nasal Mucosa; Nasal Swab  ?Result Value Ref Range Status  ? MRSA, PCR NEGATIVE NEGATIVE Final  ? Staphylococcus aureus NEGATIVE NEGATIVE Final  ?  Comment: (NOTE) ?The Xpert SA Assay (FDA approved for NASAL specimens in patients 43 ?years of age and older), is one component of a comprehensive ?surveillance program. It is not intended to diagnose infection nor to ?guide or monitor treatment. ?Performed at [facility], [address] ?[place], [place] [address] ?  ? ? ? ?_______________________________________________ ?Hospitalist was called for admission for   Symptomatic anemia ?   ? ?The following Work up has been ordered so far: ? ?Orders Placed This Encounter  ?Procedures  ? Comprehensive metabolic panel  ? CBC with Differential  ? Protime-INR  ? Cardiac monitoring  ? Consult to hospitalist  ? POC occult blood, ED  ? ED EKG  ? EKG 12-Lead  ? Type and screen [place]  ? Saline lock IV  ?  ? ?OTHER Significant initial  Findings: ? ?labs showing:  ?Recent Labs  ?Lab 11/28/21 ?0945 11/29/21 ?2036  ?NA 142 143  ?K 4.5 3.2*  ?CO2 26 24  ?GLUCOSE 105* 97  ?BUN 26* 26*  ?CREATININE 1.77* 1.58*  ?CALCIUM 8.6 8.7*  ?MG  --  2.4  ?PHOS  --  3.3  ? ? ?Cr   stable,    ?Lab Results  ?Component Value Date  ? CREATININE 1.58 (H) 11/29/2021  ? CREATININE 1.77 (H) 11/28/2021  ? CREATININE 1.45 (H) 05/23/2021  ? ? ?Recent Labs  ?Lab 11/28/21 ?0945  ?AST 16  ?ALT 9  ?BILITOT 0.9  ?PROT 6.8  ? ?Lab Results  ?Component Value Date  ? CALCIUM 8.6 11/28/2021  ? ?     ?Plt: ?Lab Results  ?Component Value Date  ? PLT 206 11/28/2021  ?  ?Recent Labs  ?Lab 11/28/21 ?0945  ?WBC 5.9  ?NEUTROABS 3,192  ?HGB 6.6*  ?  HCT 22.0*  ?MCV 84.0  ?PLT 206  ? ? ?HG/HCT   Down   from baseline see below ?   ?Component Value Date/Time  ? HGB 6.6 (L) 11/28/2021 0945  ? HGB 12.9  07/01/2015 0944  ? HGB 13.3 04/18/2010 1426  ? HCT 22.0 (L) 11/28/2021 0945  ? HCT 39.0 07/01/2015 0944  ? HCT 38.2 (L) 04/18/2010 1426  ? MCV 84.0 11/28/2021 0945  ? MCV 92 07/01/2015 0944  ? MCV 91.4 04/18/2010 1426  ? ?   ?DM  labs:  ?HbA1C: ?Recent Labs  ?  12/28/20 ?1114 05/23/21 ?1435 11/28/21 ?0945  ?HGBA1C 6.1* 6.0* 6.1*  ? ?   ?CBG (last 3)  ?Recent Labs  ?  11/29/21 ?2143  ?GLUCAP 90  ? ?   ?    ?Cultures: ?No results found for: SDES, [place], [place], REPTSTATUS ?  ?Radiological Exams on Admission: ?No results found. ?_______________________________________________________________________________________________________ ?Latest   ?Blood pressure 122/68, pulse (!) 55, temperature 98 ?F (36.7 ?C), temperature source Oral, resp. rate 19, height '5\' 8"'$  (1.727 m), weight 79.4 kg, SpO2 100 %. ? ? Vitals  labs and radiology finding personally reviewed  ?Review of Systems:  ? ? Pertinent positives include:   fatigue,  ? ?Constitutional:  ?No weight loss, night sweats, Fevers, chills,weight loss  ?HEENT:  ?No headaches, Difficulty swallowing,Tooth/dental problems,Sore throat,  ?No sneezing, itching, ear ache, nasal congestion, post nasal drip,  ?Cardio-vascular:  ?No chest pain, Orthopnea, PND, anasarca, dizziness, palpitations.no Bilateral lower extremity swelling  ?GI:  ?No heartburn, indigestion, abdominal pain, nausea, vomiting, diarrhea, change in bowel habits, loss of appetite, melena, blood in stool, hematemesis ?Resp:  ?no shortness of breath at rest. No dyspnea on exertion, No excess mucus, no productive cough, No non-productive cough, No coughing up of blood.No change in color of mucus.No wheezing. ?Skin:  ?no rash or lesions. No jaundice ?GU:  ?no dysuria, change in color of urine, no urgency or frequency. No straining to urinate.  ?No flank pain.  ?Musculoskeletal:  ?No joint pain or no joint swelling. No decreased range of motion. No back pain.  ?Psych:  ?No change in mood or affect. No depression or  anxiety. No memory loss.  ?Neuro: no localizing neurological complaints, no tingling, no weakness, no double vision, no gait abnormality, no slurred speech, no confusion ? ?All systems reviewed and apart fro

## 2021-11-29 NOTE — Telephone Encounter (Signed)
Left detailed message on voicemail for [name] (patients daughter) with [name]'s reply to labs. We will need to try again today to make contact with patient and/or family member to confirm message received. ? ? ?

## 2021-11-29 NOTE — Subjective & Objective (Signed)
Has been weak for the past few weeks no melena no blood in stool ?He is on eliquis for a.fib has been compliant ?No Cp no SOB severe fatigue ?went to PCP hg was 6.6 ?They sent him to ER ? ?

## 2021-11-29 NOTE — Assessment & Plan Note (Signed)
Symptomatic anemia causing significant fatigue. ?Iron deficiency anemia based on studies. ?Transfuse 1 unit and repeat CBC ?Gastroenterology aware will see in consult tomorrow.  N.p.o. postmidnight in case may need a procedure ?Given Hemoccult positive stool we will also add Protonix twice daily.  Source of bleeding at this point unclear.  No abdominal pain to suggest colitis.  No rapid bleeding noted.  Could have been a slow bleed hold anticoagulation ? ?

## 2021-11-29 NOTE — Telephone Encounter (Signed)
Left message on voicemail for patient/and or family memebr to return call when available   ?

## 2021-11-29 NOTE — Telephone Encounter (Signed)
Noted  

## 2021-11-29 NOTE — Telephone Encounter (Signed)
Lab results received via fax within normal range except kidney function is slightly higher than previous and hemoglobin level is very low 6.6 recommend going to the emergency room for further evaluation of low hemoglobin as soon as possible. ?

## 2021-11-29 NOTE — Assessment & Plan Note (Signed)
Order sliding scale hold p.o. medications 

## 2021-11-29 NOTE — Telephone Encounter (Signed)
[name], daughter Notified and agreed. She was in a Dr. Gabriel Carina herself. Promises that she will take patient to ER as soon as she gets home.  ?

## 2021-11-29 NOTE — Assessment & Plan Note (Signed)
-   will replace and repeat in AM,  check magnesium level and replace as needed ° °

## 2021-11-29 NOTE — ED Provider Notes (Signed)
? ?Emergency Department Provider Note ? ? ?I have reviewed the triage vital signs and the nursing notes. ? ? ?HISTORY ? ?Chief Complaint ?Abnormal Lab ? ? ?HPI ?Ronnie Jackson is a 86 y.o. male with past medical history reviewed below presents to the emergency department for evaluation of generalized weakness and low hemoglobin with his PCP.  Patient states has been feeling generally weak for several weeks.  He has struggled with some constipation but has not noticed any bright red blood or black in the bowel movements.  No severe abdominal pain.  Patient denies any chest pain or shortness of breath.  No fevers.  He remains compliant with his medications.  ? ? ?Past Medical History:  ?Diagnosis Date  ? Anxiety state, unspecified   ? Atrial fibrillation ([place]) 03/06/2016  ? Chronic pain syndrome   ? Constipation   ? Depressive disorder, not elsewhere classified   ? Dysphagia, unspecified(787.20)   ? Eye exam normal 09/01/2019  ? Diabetic eye exam did not reveal any retinopathy.   ? Herpes simplex disease   ? Hyperlipidemia   ? Hypertension   ? Hypopotassemia   ? Insomnia, unspecified   ? Intestinovesical fistula   ? Malignant neoplasm of prostate ([place])   ? Myalgia and myositis, unspecified   ? Osteoarthritis   ? Pain in joint, lower leg   ? Bilateral knee pains  ? Reflux esophagitis   ? Rheumatoid arthritis with rheumatoid factor (HCC)   ? Spinal stenosis, unspecified region other than cervical   ? Thrombocytopenia, unspecified ([place])   ? Type II or unspecified type diabetes mellitus without mention of complication, not stated as uncontrolled   ? Pt states that it is prediabetic  ? Unspecified arthropathy, pelvic region and thigh   ? Unspecified hereditary and idiopathic peripheral neuropathy   ? ? ?Review of Systems ? ?Constitutional: No fever/chills. Positive generalized weakness.  ?Eyes: No visual changes. ?ENT: No sore throat. ?Cardiovascular: Denies chest pain. ?Respiratory: Denies shortness of  breath. ?Gastrointestinal: No abdominal pain.  No nausea, no vomiting.  No diarrhea.  No constipation. ?Genitourinary: Negative for dysuria. ?Musculoskeletal: Negative for back pain. ?Skin: Negative for rash. ?Neurological: Negative for headaches. ? ? ?____________________________________________ ? ? ?PHYSICAL EXAM: ? ?VITAL SIGNS: ?ED Triage Vitals [11/29/21 1853]  ?Enc Vitals Group  ?   BP 131/80  ?   Pulse Rate 71  ?   Resp 18  ?   Temp 98 ?F (36.7 ?C)  ?   Temp Source Oral  ?   SpO2 100 %  ?   Weight 175 lb (79.4 kg)  ?   Height '5\' 8"'$  (1.727 m)  ? ?Constitutional: Alert and oriented. Well appearing and in no acute distress. ?Eyes: Conjunctivae are normal. ?Head: Atraumatic. ?Nose: No congestion/rhinnorhea. ?Mouth/Throat: Mucous membranes are moist.   ?Neck: No stridor.   ?Cardiovascular: Normal rate, regular rhythm. Good peripheral circulation. Grossly normal heart sounds.   ?Respiratory: Normal respiratory effort.  No retractions. Lungs CTAB. ?Gastrointestinal: Soft and nontender. No distention.  Rectal exam performed with patient's verbal consent and nurse chaperone.[place] stool without gross blood or melena.  Sample is Hemoccult positive.  ?Musculoskeletal: No lower extremity tenderness nor edema. No gross deformities of extremities. ?Neurologic:  Normal speech and language. No gross focal neurologic deficits are appreciated.  ?Skin:  Skin is warm, dry and intact. No rash noted. ? ?____________________________________________ ?  ?LABS ?(all labs ordered are listed, but only abnormal results are displayed) ? ?Labs Reviewed  ?COMPREHENSIVE  METABOLIC PANEL - Abnormal; Notable for the following components:  ?    Result Value  ? Potassium 3.2 (*)   ? BUN 26 (*)   ? Creatinine, Ser 1.58 (*)   ? Calcium 8.7 (*)   ? GFR, Estimated 43 (*)   ? All other components within normal limits  ?CBC WITH DIFFERENTIAL/PLATELET - Abnormal; Notable for the following components:  ? RBC 2.63 (*)   ? Hemoglobin 6.6 (*)   ? HCT 22.5  (*)   ? MCH 25.1 (*)   ? MCHC 29.3 (*)   ? RDW 16.5 (*)   ? All other components within normal limits  ?PROTIME-INR - Abnormal; Notable for the following components:  ? Prothrombin Time 17.3 (*)   ? INR 1.4 (*)   ? All other components within normal limits  ?IRON AND TIBC - Abnormal; Notable for the following components:  ? Iron 16 (*)   ? TIBC 525 (*)   ? Saturation Ratios 3 (*)   ? All other components within normal limits  ?FERRITIN - Abnormal; Notable for the following components:  ? Ferritin 7 (*)   ? All other components within normal limits  ?RETICULOCYTES - Abnormal; Notable for the following components:  ? RBC. 2.65 (*)   ? Immature Retic Fract 24.6 (*)   ? All other components within normal limits  ?POC OCCULT BLOOD, ED - Abnormal; Notable for the following components:  ? Fecal Occult Bld POSITIVE (*)   ? All other components within normal limits  ?MAGNESIUM  ?PHOSPHORUS  ?TSH  ?VITAMIN B12  ?FOLATE  ?PREALBUMIN  ?URINALYSIS, COMPLETE (UACMP) WITH MICROSCOPIC  ?CBC  ?CBC  ?HEMOGLOBIN A1C  ?CBG MONITORING, ED  ?TYPE AND SCREEN  ?PREPARE RBC (CROSSMATCH)  ?PREPARE RBC (CROSSMATCH)  ? ?____________________________________________ ? ?EKG ? ? EKG Interpretation ? ?Date/Time:  Wednesday Nov 29 2021 19:46:19 EDT ?Ventricular Rate:  66 ?PR Interval:    ?QRS Duration: 95 ?QT Interval:  440 ?QTC Calculation: 461 ?R Axis:   28 ?Text Interpretation: Atrial fibrillation Low voltage, precordial leads Nonspecific T abnormalities, lateral leads Confirmed by Nanda Quinton [phone]) on 11/29/2021 7:53:50 PM ?  ? ?  ? ? ?____________________________________________ ? ? ?PROCEDURES ? ?Procedure(s) performed:  ? ?Procedures ? ?CRITICAL CARE ?Performed by: Margette Fast ?Total critical care time: 35 minutes ?Critical care time was exclusive of separately billable procedures and treating other patients. ?Critical care was necessary to treat or prevent imminent or life-threatening deterioration. ?Critical care was time spent  personally by me on the following activities: development of treatment plan with patient and/or surrogate as well as nursing, discussions with consultants, evaluation of patient's response to treatment, examination of patient, obtaining history from patient or surrogate, ordering and performing treatments and interventions, ordering and review of laboratory studies, ordering and review of radiographic studies, pulse oximetry and re-evaluation of patient's condition. ? ?Nanda Quinton, MD ?Emergency Medicine ? ?____________________________________________ ? ? ?INITIAL IMPRESSION / ASSESSMENT AND PLAN / ED COURSE ? ?Pertinent labs & imaging results that were available during my care of the patient were reviewed by me and considered in my medical decision making (see chart for details). ?  ?This patient is Presenting for Evaluation of weakness, which does require a range of treatment options, and is a complaint that involves a high risk of morbidity and mortality. ? ?The Differential Diagnoses blood loss anemia, dehydration, AKI, developing infection.  ? ?Critical Interventions-  ?  ?Medications  ?pantoprazole (PROTONIX) injection 40 mg (40 mg Intravenous  Given 11/29/21 2104)  ?insulin aspart (novoLOG) injection 0-9 Units (0 Units Subcutaneous Not Given 11/29/21 2146)  ?0.9 %  sodium chloride infusion (Manually program via Guardrails IV Fluids) (has no administration in time range)  ?pantoprazole (PROTONIX) 80 mg /NS 100 mL IVPB (80 mg Intravenous New Bag/Given 11/29/21 2224)  ?0.9 %  sodium chloride infusion (10 mL/hr Intravenous New Bag/Given 11/29/21 2109)  ? ? ?Reassessment after intervention: No worsening symptoms.  ? ? ?I did obtain Additional Historical Information from family at bedside. ? ?I decided to review pertinent External Data, and in summary phone encounters today describe Hb of 6.6. ?  ?Clinical Laboratory Tests Ordered, included hemoccult positive. Hb of 6.6 confirmed. No AKI.  ? ?Radiologic Tests:  Considered need for CT imaging but deferred without abdominal pain, tenderness, or brisk bleeding.  ? ?Cardiac Monitor Tracing which shows NSR. ? ? ?Social Determinants of Health Risk patient with a smoking history.  ? ?Consult complet

## 2021-11-29 NOTE — Assessment & Plan Note (Signed)
Hold anticoagulation ?

## 2021-11-29 NOTE — ED Notes (Signed)
Awaiting protonix from pharmacy

## 2021-11-30 ENCOUNTER — Observation Stay (HOSPITAL_BASED_OUTPATIENT_CLINIC_OR_DEPARTMENT_OTHER): Payer: HMO

## 2021-11-30 DIAGNOSIS — E876 Hypokalemia: Secondary | ICD-10-CM | POA: Diagnosis not present

## 2021-11-30 DIAGNOSIS — D649 Anemia, unspecified: Secondary | ICD-10-CM | POA: Diagnosis not present

## 2021-11-30 DIAGNOSIS — R0609 Other forms of dyspnea: Secondary | ICD-10-CM

## 2021-11-30 DIAGNOSIS — E7800 Pure hypercholesterolemia, unspecified: Secondary | ICD-10-CM | POA: Diagnosis not present

## 2021-11-30 DIAGNOSIS — I4819 Other persistent atrial fibrillation: Secondary | ICD-10-CM | POA: Diagnosis not present

## 2021-11-30 DIAGNOSIS — Z7901 Long term (current) use of anticoagulants: Secondary | ICD-10-CM | POA: Diagnosis not present

## 2021-11-30 DIAGNOSIS — R609 Edema, unspecified: Secondary | ICD-10-CM | POA: Diagnosis not present

## 2021-11-30 DIAGNOSIS — E1142 Type 2 diabetes mellitus with diabetic polyneuropathy: Secondary | ICD-10-CM | POA: Diagnosis not present

## 2021-11-30 LAB — GLUCOSE, CAPILLARY
Glucose-Capillary: 119 mg/dL — ABNORMAL HIGH (ref 70–99)
Glucose-Capillary: 133 mg/dL — ABNORMAL HIGH (ref 70–99)
Glucose-Capillary: 83 mg/dL (ref 70–99)
Glucose-Capillary: 90 mg/dL (ref 70–99)
Glucose-Capillary: 90 mg/dL (ref 70–99)
Glucose-Capillary: 90 mg/dL (ref 70–99)
Glucose-Capillary: 98 mg/dL (ref 70–99)

## 2021-11-30 LAB — URINALYSIS, COMPLETE (UACMP) WITH MICROSCOPIC
Bacteria, UA: NONE SEEN
Bilirubin Urine: NEGATIVE
Glucose, UA: NEGATIVE mg/dL
Hgb urine dipstick: NEGATIVE
Ketones, ur: NEGATIVE mg/dL
Leukocytes,Ua: NEGATIVE
Nitrite: NEGATIVE
Protein, ur: NEGATIVE mg/dL
Specific Gravity, Urine: 1.008 (ref 1.005–1.030)
pH: 5 (ref 5.0–8.0)

## 2021-11-30 LAB — ECHOCARDIOGRAM COMPLETE
AR max vel: 1.31 cm2
AV Area VTI: 1.17 cm2
AV Area mean vel: 1.2 cm2
AV Mean grad: 6 mmHg
AV Peak grad: 11.4 mmHg
Ao pk vel: 1.69 m/s
Area-P 1/2: 4.89 cm2
Calc EF: 40.2 %
Height: 68 in
S' Lateral: 3.9 cm
Single Plane A2C EF: 42.4 %
Single Plane A4C EF: 34.6 %
Weight: 2800 oz

## 2021-11-30 LAB — CBC
HCT: 21.6 % — ABNORMAL LOW (ref 39.0–52.0)
HCT: 26.4 % — ABNORMAL LOW (ref 39.0–52.0)
Hemoglobin: 6.3 g/dL — CL (ref 13.0–17.0)
Hemoglobin: 8.1 g/dL — ABNORMAL LOW (ref 13.0–17.0)
MCH: 25.1 pg — ABNORMAL LOW (ref 26.0–34.0)
MCH: 25.7 pg — ABNORMAL LOW (ref 26.0–34.0)
MCHC: 29.2 g/dL — ABNORMAL LOW (ref 30.0–36.0)
MCHC: 30.7 g/dL (ref 30.0–36.0)
MCV: 86.1 fL (ref 80.0–100.0)
MCV: 83.8 fL (ref 80.0–100.0)
Platelets: 190 10*3/uL (ref 150–400)
Platelets: 124 10*3/uL — ABNORMAL LOW (ref 150–400)
RBC: 2.51 MIL/uL — ABNORMAL LOW (ref 4.22–5.81)
RBC: 3.15 MIL/uL — ABNORMAL LOW (ref 4.22–5.81)
RDW: 16.6 % — ABNORMAL HIGH (ref 11.5–15.5)
RDW: 15.9 % — ABNORMAL HIGH (ref 11.5–15.5)
WBC: 5.5 10*3/uL (ref 4.0–10.5)
WBC: 5.9 10*3/uL (ref 4.0–10.5)
nRBC: 0 % (ref 0.0–0.2)
nRBC: 0.3 % — ABNORMAL HIGH (ref 0.0–0.2)

## 2021-11-30 LAB — COMPREHENSIVE METABOLIC PANEL
ALT: 12 U/L (ref 0–44)
AST: 24 U/L (ref 15–41)
Albumin: 3.9 g/dL (ref 3.5–5.0)
Alkaline Phosphatase: 51 U/L (ref 38–126)
Anion gap: 10 (ref 5–15)
BUN: 24 mg/dL — ABNORMAL HIGH (ref 8–23)
CO2: 21 mmol/L — ABNORMAL LOW (ref 22–32)
Calcium: 8.3 mg/dL — ABNORMAL LOW (ref 8.9–10.3)
Chloride: 110 mmol/L (ref 98–111)
Creatinine, Ser: 1.63 mg/dL — ABNORMAL HIGH (ref 0.61–1.24)
GFR, Estimated: 41 mL/min — ABNORMAL LOW (ref >60)
Glucose, Bld: 112 mg/dL — ABNORMAL HIGH (ref 70–99)
Potassium: 4 mmol/L (ref 3.5–5.1)
Sodium: 141 mmol/L (ref 135–145)
Total Bilirubin: 2.1 mg/dL — ABNORMAL HIGH (ref 0.3–1.2)
Total Protein: 6.9 g/dL (ref 6.5–8.1)

## 2021-11-30 LAB — PREALBUMIN: Prealbumin: 24.8 mg/dL (ref 18–38)

## 2021-11-30 LAB — ABO/RH: ABO/RH(D): O NEG

## 2021-11-30 LAB — HEMOGLOBIN A1C
Hgb A1c MFr Bld: 6.2 % — ABNORMAL HIGH (ref 4.8–5.6)
Mean Plasma Glucose: 131.24 mg/dL

## 2021-11-30 MED ORDER — POTASSIUM CHLORIDE 10 MEQ/100ML IV SOLN
10.0000 meq | INTRAVENOUS | Status: AC
  Administered 2021-11-30 (×2): 10 meq via INTRAVENOUS
  Filled 2021-11-30 (×2): qty 100

## 2021-11-30 MED ORDER — ENSURE ENLIVE PO LIQD: 237.0000 mL | Freq: Two times a day (BID) | ORAL | Status: DC

## 2021-11-30 MED ORDER — ADULT MULTIVITAMIN W/MINERALS CH
1.0000 | ORAL_TABLET | Freq: Every day | ORAL | Status: DC
  Administered 2021-11-30: 1 via ORAL
  Filled 2021-11-30 (×2): qty 1

## 2021-11-30 NOTE — Progress Notes (Signed)
Initial Nutrition Assessment ? ?INTERVENTION:  ? ?Once diet advanced:  ?-Ensure Plus High Protein po BID, each supplement provides 350 kcal and 20 grams of protein.  ? ?-Multivitamin with minerals daily ? ?NUTRITION DIAGNOSIS:  ? ?Increased nutrient needs related to acute illness as evidenced by estimated needs. ? ?GOAL:  ? ?Patient will meet greater than or equal to 90% of their needs ? ?MONITOR:  ? ?PO intake, Supplement acceptance, Labs, Weight trends, I & O's ? ?REASON FOR ASSESSMENT:  ? ?Consult ?Assessment of nutrition requirement/status ? ?ASSESSMENT:  ? ?86 y.o. male with medical history significant of a.fib on eliquis, DM2, HLD, constipation,  Rheumatoid arthritis with rheumatoid factor, Prostate cancer. Admitted for Symptomatic anemia ? ?Patient in room, daughter at bedside.  ?Pt very HOH but was able to understand RD with the help of his daughter.  ?Pt reports he eats/snacks throughout the day, on "junk". Pt states he eats microwaveable potatoes, onion rings, candy, cookies, etc. He does not eat a lot of meat, likes eggs. Family sometimes brings foods for pt but he won't eat it. Pt reports he is motivated to try and eat better and include more protein. Per daughter, pt ate well balanced meals when his wife was living but she passed ~2 years ago.  ? ?Pt currently on clear liquids, will be NPO tomorrow for EGD to investigate iron deficiency anemia.  ?Pt states he only takes magnesium and COQ10 supplements. He is willing to try Ensure supplements in chocolate once diet is advanced. ? ?Per weight records, weight has been stable recently. Weight has been trending down since 2019.  ? ?Medications: Colace, Lasix, KCl ? ?Labs reviewed: ? CBGs: 83-119 ? ?NUTRITION - FOCUSED PHYSICAL EXAM: ? ?Flowsheet Row Most Recent Value  ?Orbital Region Mild depletion  ?Upper Arm Region Mild depletion  ?Thoracic and Lumbar Region No depletion  ?Buccal Region Moderate depletion  ?Temple Region Mild depletion  ?Clavicle Bone  Region Mild depletion  ?Clavicle and Acromion Bone Region Mild depletion  ?Scapular Bone Region Mild depletion  ?Dorsal Hand No depletion  ?Patellar Region No depletion  [severe edema]  ?Anterior Thigh Region No depletion  ?Posterior Calf Region No depletion  ?Edema (RD Assessment) Severe  [BLEs]  ?Hair Reviewed  [thin]  ?Eyes Reviewed  ?Mouth Reviewed  [edentulous, uses dentures]  ?Skin Reviewed  ?Nails Reviewed  ? ?  ? ? ?Diet Order:   ?Diet Order   ? ?       ?  Diet NPO time specified  Diet effective midnight       ?  ?  Diet clear liquid Room service appropriate? Yes; Fluid consistency: Thin  Diet effective now       ?  ? ?  ?  ? ?  ? ? ?EDUCATION NEEDS:  ? ?Education needs have been addressed ? ?Skin:  Skin Assessment: Reviewed RN Assessment ? ?Last BM:  5/10 ? ?Height:  ? ?Ht Readings from Last 1 Encounters:  ?11/29/21 '5\' 8"'$  (1.727 m)  ? ? ?Weight:  ? ?Wt Readings from Last 1 Encounters:  ?11/29/21 79.4 kg  ? ? ?BMI:  Body mass index is 26.61 kg/m?. ? ?Estimated Nutritional Needs:  ? ?Kcal:  1900-2100 ? ?Protein:  90-100g ? ?Fluid:  2L/day ? ?Clayton Bibles, MS, RD, LDN ?Inpatient Clinical Dietitian ?Contact information available via Amion ? ?

## 2021-11-30 NOTE — Progress Notes (Signed)
Speech Language Pathology Treatment:    ?Patient Details ?Name: Ronnie Jackson ?MRN: [id number] ?DOB: [birth year] ?Today's Date: 11/30/2021 ?Time:  -  ?  ? ?Received order for swallow eval however noted that pt was made NPO after midnight in case he needs a procedure with GI.  ?ST will follow along. ?   ? ? ? ?   ? ? ? ? ?  ?  ? ? ?[name] ? ?11/30/2021, 9:21 AM ?

## 2021-11-30 NOTE — Progress Notes (Signed)
PT Cancellation Note ? ?Patient Details ?Name: Ronnie Jackson ?MRN: [id number] ?DOB: 1936-03-14 ? ? ?Cancelled Treatment:    Reason Eval/Treat Not Completed: Pain limiting ability to participate;Fatigue/lethargy limiting ability to participate (pt sleeping soundly upon my arrival, pt stated he didn't want to walk bc his feet are burning, but politely asked if PT could come another time. RN notified. Will follow.) ? ? ?Blondell Reveal Kistler PT 11/30/2021  ?Acute Rehabilitation Services ?Pager [phone] ?Office [phone] ? ?

## 2021-11-30 NOTE — Progress Notes (Signed)
?  Transition of Care (TOC) Screening Note ? ? ?Patient Details  ?Name: Ronnie Jackson ?Date of Birth: [birth year] ? ? ?Transition of Care (TOC) CM/SW Contact:    ?Aalina Brege, LCSW ?Phone Number: ?11/30/2021, 1:48 PM ? ? ? ?Transition of Care Department [facility]) has reviewed patient and no TOC needs have been identified at this time. We will continue to monitor patient advancement through interdisciplinary progression rounds. If new patient transition needs arise, please place a TOC consult. ? ? ?

## 2021-11-30 NOTE — Progress Notes (Addendum)
[facility] ? ?PROGRESS NOTE ? ?Ronnie Jackson QHU:[id number] DOB: [birth year] DOA: 11/29/2021 ?PCP: Ngetich, Nelda Bucks, NP ? ? ?Brief HPI:   ?86 year old male with history of atrial fibrillation on Eliquis, diabetes mellitus type 2, hyperlipidemia, constipation, remote arthritis, prostate cancer presented with low hemoglobin.  Patient has been having generalized fatigue.  Denies melena, no blood in stool.  He has been taking Eliquis.  In the ED he was found to have severe anemia with hemoglobin 6.6.  Transfused 2 units PRBC.  Gastroenterology was consulted.  FOBT was positive. ? ? ? ?Subjective  ? ?Patient seen and examined, denies any complaints.  No chest pain or shortness of breath. ? ? Assessment/Plan:  ? ? ?Severe iron deficiency anemia ?-Serum iron 16, saturation 3%, ferritin 7 ?-S/p 2 units PRBC, hemoglobin this morning is 8.1 ?-FOBT is positive ?-Gastroenterology consulted, plan for EGD in a.m. ?-If EGD unremarkable then colonoscopy will be performed ? ?Diabetes mellitus type 2 ?-Continue sliding scale insulin NovoLog ?-CBG well controlled ? ?Bilateral lower extremity edema/chronic systolic CHF ?-Patient has been on Lasix 40 mg p.o. twice daily ?-Echocardiogram shows EF of 40 to 45%, left ventricle demonstrates global hypokinesis ? ?Persistent atrial fibrillation ?-Eliquis on hold ?-Heart rate is controlled ? ?Hypokalemia ?-Replete ? ?CKD stage IIIb ?-Creatinine at baseline ? ? ? ?Medications ? ?  ? docusate sodium  100 mg Oral BID  ? [START ON 12/01/2021] feeding supplement  237 mL Oral BID BM  ? furosemide  40 mg Oral BID  ? insulin aspart  0-9 Units Subcutaneous Q4H  ? multivitamin with minerals  1 tablet Oral Daily  ? pantoprazole (PROTONIX) IV  40 mg Intravenous Q12H  ? sodium chloride flush  3 mL Intravenous Q12H  ? ? ? Data Reviewed:  ? ?CBG: ? ?Recent Labs  ?Lab 11/29/21 ?2143 11/30/21 ?4656 11/30/21 ?0522 11/30/21 ?8127 11/30/21 ?1145  ?GLUCAP 90 83 90 90 119*  ? ? ?SpO2: 100 %  ? ? ?Vitals:  ?  11/30/21 0815 11/30/21 0841 11/30/21 1050 11/30/21 1422  ?BP: 111/83 116/71 (!) 157/70 135/71  ?Pulse: 82 75 (!) 50 63  ?Resp: '16  20 19  '$ ?Temp: 98.3 ?F (36.8 ?C) 98.3 ?F (36.8 ?C) 98.2 ?F (36.8 ?C) 97.9 ?F (36.6 ?C)  ?TempSrc: Oral Oral Oral Oral  ?SpO2: 100% 100% 100% 100%  ?Weight:      ?Height:      ? ? ? ? ?Data Reviewed: ? ?Basic Metabolic Panel: ?Recent Labs  ?Lab 11/28/21 ?0945 11/29/21 ?2036 11/30/21 ?1210  ?NA 142 143 141  ?K 4.5 3.2* 4.0  ?CL 107 110 110  ?CO2 26 24 21*  ?GLUCOSE 105* 97 112*  ?BUN 26* 26* 24*  ?CREATININE 1.77* 1.58* 1.63*  ?CALCIUM 8.6 8.7* 8.3*  ?MG  --  2.4  --   ?PHOS  --  3.3  --   ? ? ?CBC: ?Recent Labs  ?Lab 11/28/21 ?0945 11/29/21 ?2036 11/30/21 ?5170 11/30/21 ?1210  ?WBC 5.9 6.5 5.5 5.9  ?NEUTROABS 3,192 4.0  --   --   ?HGB 6.6* 6.6* 6.3* 8.1*  ?HCT 22.0* 22.5* 21.6* 26.4*  ?MCV 84.0 85.6 86.1 83.8  ?PLT 206 213 190 124*  ? ? ?LFT ?Recent Labs  ?Lab 11/28/21 ?0945 11/29/21 ?2036 11/30/21 ?1210  ?AST '16 21 24  '$ ?ALT '9 13 12  '$ ?ALKPHOS  --  59 51  ?BILITOT 0.9 0.9 2.1*  ?PROT 6.8 7.6 6.9  ?ALBUMIN  --  4.2 3.9  ? ?  ?  Antibiotics: ?Anti-infectives (From admission, onward)  ? ? None  ? ?  ? ? ? ?DVT prophylaxis: SCDs ? ?Code Status: Full code ? ?Family Communication: No family at bedside ? ? ?CONSULTS gastroenterology ? ? ?Objective  ? ? ?Physical Examination: ? ?General-appears in no acute distress ?Heart-S1-S2, regular, no murmur auscultated ?Lungs-clear to auscultation bilaterally, no wheezing or crackles auscultated ?Abdomen-soft, nontender, no organomegaly ?Extremities-bilateral edema in the lower extremities ?Neuro-alert, oriented x3, no focal deficit noted ? ? ?Status is: Inpatient:   ? ? ? ?  ? ? ? ? ? ?Ronnie Jackson ?  ?[facility] ?If 7PM-7AM, please contact night-coverage at [email or web address], ?Office  [phone] ? ? ?11/30/2021, 3:25 PM  LOS: 0 days  ? ? ? ? ? ? ? ? ? ? ?  ?

## 2021-11-30 NOTE — Consult Note (Signed)
Referring Provider: TRH ?Primary Care Physician:  Sandrea Hughs, NP ?Primary Gastroenterologist: Althia Forts ? ?Reason for Consultation: Symptomatic anemia ? ?HPI: Ronnie Jackson is a 86 y.o. male with medical history significant of a.fib on eliquis, DM2, HLD, constipation, Rheumatoid arthritis with rheumatoid factor, Prostate cancer. Presented to the ED for profound anemia. ? ?He reports he has been feeling fatigued and short of breath with exertion for the last 2 to 3 months. Patient presented to PCP for blood work, hemoglobin was 6.6,  patient was directed to present to the emergency room.  ? ?He denies melena, hematochezia, nausea, vomiting, hematemesis, constipation, diarrhea, abdominal pain.  Denies previous GI bleed.  Denies NSAID use.  Denies alcohol use and smoking.  Patient does chew tobacco.  Denies family history of colon cancer. ? ?He is taking Eliquis for A-fib.  Last dose 11/29/2021 in the AM. ? ?Last colonoscopy at [facility] 2008.  Significant for hemorrhoids recall 5 years.  ?No previous EGD. ? ? ?Past Medical History:  ?Diagnosis Date  ? Anxiety state, unspecified   ? Atrial fibrillation ([place]) 03/06/2016  ? Chronic pain syndrome   ? Constipation   ? Depressive disorder, not elsewhere classified   ? Dysphagia, unspecified(787.20)   ? Eye exam normal 09/01/2019  ? Diabetic eye exam did not reveal any retinopathy.   ? Herpes simplex disease   ? Hyperlipidemia   ? Hypertension   ? Hypopotassemia   ? Insomnia, unspecified   ? Intestinovesical fistula   ? Malignant neoplasm of prostate ([place])   ? Myalgia and myositis, unspecified   ? Osteoarthritis   ? Pain in joint, lower leg   ? Bilateral knee pains  ? Reflux esophagitis   ? Rheumatoid arthritis with rheumatoid factor (HCC)   ? Spinal stenosis, unspecified region other than cervical   ? Thrombocytopenia, unspecified ([place])   ? Type II or unspecified type diabetes mellitus without mention of complication, not stated as uncontrolled   ? Pt states that it  is prediabetic  ? Unspecified arthropathy, pelvic region and thigh   ? Unspecified hereditary and idiopathic peripheral neuropathy   ? ? ?Past Surgical History:  ?Procedure Laterality Date  ? CARDIAC CATHETERIZATION  01/31/2004  ? Dr Gwenlyn Found  ? CARDIOVERSION N/A 07/12/2016  ? Procedure: CARDIOVERSION;  Surgeon: Pixie Casino, MD;  Location: [place];  Service: Cardiovascular;  Laterality: N/A;  ? COLONOSCOPY  08/09/2006  ? Dr Christian Mate, hemorrhoids/rectal fistula  ? cystocopy  07/2006  ? Dr [name]  ? NASAL SINUS SURGERY  1992  ? PROSTATE SURGERY  1991  ? TOTAL HIP ARTHROPLASTY Right 02/20/2018  ? Procedure: RIGHT TOTAL HIP ARTHROPLASTY ANTERIOR APPROACH;  Surgeon: Mcarthur Rossetti, MD;  Location: [facility];  Service: Orthopedics;  Laterality: Right;  ? ? ?Prior to Admission medications   ?Medication Sig Start Date End Date Taking? Authorizing Provider  ?apixaban (ELIQUIS) 5 MG TABS tablet TAKE 1 TABLET BY MOUTH 2 TIMES DAILY TO HELP STROKE RELATED TO ATRIAL FIBRILLATION ?Patient taking differently: Take 5 mg by mouth 2 (two) times daily. 09/05/21 09/05/22 Yes Ngetich, [name] C, NP  ?furosemide (LASIX) 40 MG tablet Take Two tablet by mouth in the morning and one tablet by mouth in the afternoon daily.x 3 days then resume one by tablet by mouth twice daily. ?Patient taking differently: Take 40 mg by mouth 2 (two) times daily. 11/20/21  Yes Ngetich, [name] C, NP  ?polyethylene glycol (MIRALAX / GLYCOLAX) 17 g packet Take 17 g by mouth daily.  11/28/19  Yes Khatri, Hina, PA-C  ?potassium chloride SA (KLOR-CON) 20 MEQ tablet TAKE 2 TABLETS BY MOUTH DAILY WHEN TAKING LASIX ([place]) ?Patient taking differently: Take 20 mEq by mouth 2 (two) times daily. 09/08/20 01/27/22 Yes Reed, Tiffany L, DO  ?cromolyn (OPTICROM) 4 % ophthalmic solution Place 1 drop into both eyes 4 (four) times daily. ?Patient not taking: Reported on 11/30/2021 11/25/18   Ngetich, Nelda Bucks, NP  ? ? ?Scheduled Meds: ? docusate sodium  100 mg Oral BID  ?  furosemide  40 mg Oral BID  ? insulin aspart  0-9 Units Subcutaneous Q4H  ? pantoprazole (PROTONIX) IV  40 mg Intravenous Q12H  ? sodium chloride flush  3 mL Intravenous Q12H  ? ?Continuous Infusions: ? sodium chloride    ? ?PRN Meds:.sodium chloride, acetaminophen **OR** acetaminophen, albuterol, HYDROcodone-acetaminophen, sodium chloride flush ? ?Allergies as of 11/29/2021  ? (No Known Allergies)  ? ? ?Family History  ?Problem Relation Age of Onset  ? Prostate cancer Father   ? Heart failure Mother   ? Lung cancer Daughter   ? Arthritis Sister   ? ? ?Social History  ? ?Socioeconomic History  ? Marital status: Married  ?  Spouse name: Jakaree Pickard  ? Number of children: 1  ? Years of education: 46  ? Highest education level: 12th grade  ?Occupational History  ? Occupation: Retired  ?Tobacco Use  ? Smoking status: Never  ? Smokeless tobacco: Current  ?  Types: Chew  ? Tobacco comments:  ?  Chews tobacco daily   ?Vaping Use  ? Vaping Use: Never used  ?Substance and Sexual Activity  ? Alcohol use: No  ? Drug use: No  ? Sexual activity: Not Currently  ?Other Topics Concern  ? Not on file  ?Social History Narrative  ? Not on file  ? ?Social Determinants of Health  ? ?Financial Resource Strain: Not on file  ?Food Insecurity: Not on file  ?Transportation Needs: Not on file  ?Physical Activity: Not on file  ?Stress: Not on file  ?Social Connections: Not on file  ?Intimate Partner Violence: Not on file  ? ? ?Review of Systems: Review of Systems  ?Constitutional:  Positive for malaise/fatigue. Negative for chills and fever.  ?HENT:  Positive for hearing loss. Negative for tinnitus.   ?Eyes:  Negative for blurred vision and double vision.  ?Respiratory:  Positive for shortness of breath. Negative for cough and hemoptysis.   ?Cardiovascular:  Negative for chest pain and palpitations.  ?Gastrointestinal:  Negative for abdominal pain, blood in stool, constipation, diarrhea, heartburn, melena, nausea and vomiting.  ?Genitourinary:   Negative for dysuria and urgency.  ?Musculoskeletal:  Negative for myalgias and neck pain.  ?Skin:  Negative for itching and rash.  ?Neurological:  Negative for dizziness and headaches.  ?Endo/Heme/Allergies:  Negative for environmental allergies. Does not bruise/bleed easily.  ?Psychiatric/Behavioral:  Negative for depression and substance abuse.   ?. ? ?Physical Exam:Physical Exam ?Constitutional:   ?   General: He is not in acute distress. ?   Appearance: He is normal weight.  ?HENT:  ?   Head: Normocephalic and atraumatic.  ?   Right Ear: External ear normal.  ?   Left Ear: External ear normal.  ?   Nose: Nose normal.  ?   Mouth/Throat:  ?   Mouth: Mucous membranes are moist.  ?Eyes:  ?   Pupils: Pupils are equal, round, and reactive to light.  ?   Comments: Conjunctival pallor  ?Cardiovascular:  ?  Rate and Rhythm: Normal rate and regular rhythm.  ?   Pulses: Normal pulses.  ?   Heart sounds: Normal heart sounds.  ?Pulmonary:  ?   Effort: Pulmonary effort is normal.  ?   Breath sounds: Normal breath sounds.  ?Abdominal:  ?   General: Abdomen is flat. Bowel sounds are normal. There is no distension.  ?   Palpations: Abdomen is soft. There is no mass.  ?   Tenderness: There is no abdominal tenderness. There is no guarding or rebound.  ?   Hernia: No hernia is present.  ?Musculoskeletal:     ?   General: Normal range of motion.  ?   Cervical back: Normal range of motion and neck supple.  ?Skin: ?   General: Skin is warm and dry.  ?   Coloration: Skin is pale.  ?Neurological:  ?   General: No focal deficit present.  ?   Mental Status: He is alert and oriented to person, place, and time. Mental status is at baseline.  ?Psychiatric:     ?   Mood and Affect: Mood normal.     ?   Behavior: Behavior normal.  ? ? ?Vital signs: ?Vitals:  ? 11/30/21 0815 11/30/21 0841  ?BP: 111/83 116/71  ?Pulse: 82 75  ?Resp: 16   ?Temp: 98.3 ?F (36.8 ?C) 98.3 ?F (36.8 ?C)  ?SpO2: 100% 100%  ? ?Last BM Date : 11/29/21 ? ? ? ?GI:  ?Lab  Results: ?Recent Labs  ?  11/28/21 ?0945 11/29/21 ?2036 11/30/21 ?2010  ?WBC 5.9 6.5 5.5  ?HGB 6.6* 6.6* 6.3*  ?HCT 22.0* 22.5* 21.6*  ?PLT 206 213 190  ? ?BMET ?Recent Labs  ?  11/28/21 ?0945 11/29/21 ?2036

## 2021-11-30 NOTE — Plan of Care (Signed)

## 2021-11-30 NOTE — Evaluation (Addendum)
Clinical/Bedside Swallow Evaluation ?Patient Details  ?Name: Ronnie Jackson ?MRN: [id number] ?Date of Birth: May 14, 1936 ? ?Today's Date: 11/30/2021 ?Time: SLP Start Time (ACUTE ONLY): 1108 SLP Stop Time (ACUTE ONLY): 1122 ?SLP Time Calculation (min) (ACUTE ONLY): 14 min ? ?Past Medical History:  ?Past Medical History:  ?Diagnosis Date  ? Anxiety state, unspecified   ? Atrial fibrillation ([place]) 03/06/2016  ? Chronic pain syndrome   ? Constipation   ? Depressive disorder, not elsewhere classified   ? Dysphagia, unspecified(787.20)   ? Eye exam normal 09/01/2019  ? Diabetic eye exam did not reveal any retinopathy.   ? Herpes simplex disease   ? Hyperlipidemia   ? Hypertension   ? Hypopotassemia   ? Insomnia, unspecified   ? Intestinovesical fistula   ? Malignant neoplasm of prostate ([place])   ? Myalgia and myositis, unspecified   ? Osteoarthritis   ? Pain in joint, lower leg   ? Bilateral knee pains  ? Reflux esophagitis   ? Rheumatoid arthritis with rheumatoid factor (HCC)   ? Spinal stenosis, unspecified region other than cervical   ? Thrombocytopenia, unspecified ([place])   ? Type II or unspecified type diabetes mellitus without mention of complication, not stated as uncontrolled   ? Pt states that it is prediabetic  ? Unspecified arthropathy, pelvic region and thigh   ? Unspecified hereditary and idiopathic peripheral neuropathy   ? ?Past Surgical History:  ?Past Surgical History:  ?Procedure Laterality Date  ? CARDIAC CATHETERIZATION  01/31/2004  ? Dr Gwenlyn Found  ? CARDIOVERSION N/A 07/12/2016  ? Procedure: CARDIOVERSION;  Surgeon: Pixie Casino, MD;  Location: [place];  Service: Cardiovascular;  Laterality: N/A;  ? COLONOSCOPY  08/09/2006  ? Dr Christian Mate, hemorrhoids/rectal fistula  ? cystocopy  07/2006  ? Dr [name]  ? NASAL SINUS SURGERY  1992  ? PROSTATE SURGERY  1991  ? TOTAL HIP ARTHROPLASTY Right 02/20/2018  ? Procedure: RIGHT TOTAL HIP ARTHROPLASTY ANTERIOR APPROACH;  Surgeon: Mcarthur Rossetti, MD;   [facility];  Service: Orthopedics;  Laterality: Right;  ? ?HPI:  ?Ronnie Jackson is a 86 y.o. male with medical history significant of a.fib,  DM2, HLD, constipation, rheumatoid arthritis, prostate cancer admitted with low hemoglobin. On clear liquids and will be made NPO after midnight tonight for potential procedure tomorrow.  ?  ?Assessment / Plan / Recommendation  ?Clinical Impression ? Pt presently is on clear liquids only in preparation for his colonoscopy tomorrow. He states he chews tobacco and only coughs with that occasionally but not po's prior to admission. He is lacking dentition, dentures not present and pt states he does not eat with them at home. Normal appearing strength and ROM during oral-motor examination. Pharyngeal swallows appeared synchronous with respirations with water and applejuice presentations. Unable to assess solids at this time. Do not suspect he will have difficulty masticating a regular texture once allowed to upgrade by MD. Daughter present and denies history of dysphagia and pt denies reflux symptoms. Continue clear liquids per MD and upgrade when appropriate. ST will sign off. ?SLP Visit Diagnosis: Dysphagia, unspecified (R13.10) ?   ?Aspiration Risk ? Mild aspiration risk  ?  ?Diet Recommendation Thin liquid;Other (Comment) (clears per MD due to colonoscopy tomorrow)  ? ?Liquid Administration via: Cup;Straw ?Medication Administration: Whole meds with liquid ?Supervision: Patient able to self feed ?Compensations: Slow rate;Small sips/bites;Minimize environmental distractions ?Postural Changes: Seated upright at 90 degrees  ?  ?Other  Recommendations Oral Care Recommendations: Oral care BID   ? ?  Recommendations for follow up therapy are one component of a multi-disciplinary discharge planning process, led by the attending physician.  Recommendations may be updated based on patient status, additional functional criteria and insurance authorization. ? ?Follow up  Recommendations No SLP follow up  ? ? ?  ?Assistance Recommended at Discharge None  ?Functional Status Assessment    ?Frequency and Duration    ?  ?  ?   ? ?Prognosis    ? ?  ? ?Swallow Study   ?General Date of Onset: 11/29/21 ?HPI: Ronnie Jackson is a 86 y.o. male with medical history significant of a.fib,  DM2, HLD, constipation, rheumatoid arthritis, prostate cancer admitted with low hemoglobin. On clear liquids and will be made NPO after midnight tonight for potential procedure tomorrow. ?Type of Study: Bedside Swallow Evaluation ?Previous Swallow Assessment:  (no) ?Diet Prior to this Study: Thin liquids;Other (Comment) (clears due to colonoscopy tomorrow) ?Temperature Spikes Noted: No ?Respiratory Status: Room air ?History of Recent Intubation: No ?Behavior/Cognition: Alert;Cooperative;Pleasant mood ?Oral Cavity Assessment: Within Functional Limits ?Oral Care Completed by SLP: No ?Oral Cavity - Dentition: Edentulous;Dentures, not available ?Vision: Functional for self-feeding ?Self-Feeding Abilities: Able to feed self ?Patient Positioning: Upright in bed ?Baseline Vocal Quality: Normal ?Volitional Cough: Strong ?Volitional Swallow: Able to elicit  ?  ?Oral/Motor/Sensory Function Overall Oral Motor/Sensory Function: Within functional limits   ?Ice Chips Ice chips: Not tested   ?Thin Liquid Thin Liquid: Within functional limits ?Presentation: Straw  ?  ?Nectar Thick Nectar Thick Liquid: Not tested   ?Honey Thick Honey Thick Liquid: Not tested   ?Puree Puree: Not tested (on clears only today)   ?Solid ? ? ?  Solid: Not tested (on clears only today)  ? ?  ? ?[name] ?11/30/2021,1:56 PM ? ? ? ?

## 2021-11-30 NOTE — H&P (View-Only) (Signed)
Referring Provider: TRH ?Primary Care Physician:  Sandrea Hughs, NP ?Primary Gastroenterologist: Althia Forts ? ?Reason for Consultation: Symptomatic anemia ? ?HPI: Ronnie Jackson is a 86 y.o. male with medical history significant of a.fib on eliquis, DM2, HLD, constipation, Rheumatoid arthritis with rheumatoid factor, Prostate cancer. Presented to the ED for profound anemia. ? ?He reports he has been feeling fatigued and short of breath with exertion for the last 2 to 3 months. Patient presented to PCP for blood work, hemoglobin was 6.6,  patient was directed to present to the emergency room.  ? ?He denies melena, hematochezia, nausea, vomiting, hematemesis, constipation, diarrhea, abdominal pain.  Denies previous GI bleed.  Denies NSAID use.  Denies alcohol use and smoking.  Patient does chew tobacco.  Denies family history of colon cancer. ? ?He is taking Eliquis for A-fib.  Last dose 11/29/2021 in the AM. ? ?Last colonoscopy at [facility] 2008.  Significant for hemorrhoids recall 5 years.  ?No previous EGD. ? ? ?Past Medical History:  ?Diagnosis Date  ? Anxiety state, unspecified   ? Atrial fibrillation ([place]) 03/06/2016  ? Chronic pain syndrome   ? Constipation   ? Depressive disorder, not elsewhere classified   ? Dysphagia, unspecified(787.20)   ? Eye exam normal 09/01/2019  ? Diabetic eye exam did not reveal any retinopathy.   ? Herpes simplex disease   ? Hyperlipidemia   ? Hypertension   ? Hypopotassemia   ? Insomnia, unspecified   ? Intestinovesical fistula   ? Malignant neoplasm of prostate ([place])   ? Myalgia and myositis, unspecified   ? Osteoarthritis   ? Pain in joint, lower leg   ? Bilateral knee pains  ? Reflux esophagitis   ? Rheumatoid arthritis with rheumatoid factor (HCC)   ? Spinal stenosis, unspecified region other than cervical   ? Thrombocytopenia, unspecified ([place])   ? Type II or unspecified type diabetes mellitus without mention of complication, not stated as uncontrolled   ? Pt states that it  is prediabetic  ? Unspecified arthropathy, pelvic region and thigh   ? Unspecified hereditary and idiopathic peripheral neuropathy   ? ? ?Past Surgical History:  ?Procedure Laterality Date  ? CARDIAC CATHETERIZATION  01/31/2004  ? Dr Gwenlyn Found  ? CARDIOVERSION N/A 07/12/2016  ? Procedure: CARDIOVERSION;  Surgeon: Pixie Casino, MD;  Location: [place];  Service: Cardiovascular;  Laterality: N/A;  ? COLONOSCOPY  08/09/2006  ? Dr Christian Mate, hemorrhoids/rectal fistula  ? cystocopy  07/2006  ? Dr [name]  ? NASAL SINUS SURGERY  1992  ? PROSTATE SURGERY  1991  ? TOTAL HIP ARTHROPLASTY Right 02/20/2018  ? Procedure: RIGHT TOTAL HIP ARTHROPLASTY ANTERIOR APPROACH;  Surgeon: Mcarthur Rossetti, MD;  [facility];  Service: Orthopedics;  Laterality: Right;  ? ? ?Prior to Admission medications   ?Medication Sig Start Date End Date Taking? Authorizing Provider  ?apixaban (ELIQUIS) 5 MG TABS tablet TAKE 1 TABLET BY MOUTH 2 TIMES DAILY TO HELP STROKE RELATED TO ATRIAL FIBRILLATION ?Patient taking differently: Take 5 mg by mouth 2 (two) times daily. 09/05/21 09/05/22 Yes Ngetich, [name] C, NP  ?furosemide (LASIX) 40 MG tablet Take Two tablet by mouth in the morning and one tablet by mouth in the afternoon daily.x 3 days then resume one by tablet by mouth twice daily. ?Patient taking differently: Take 40 mg by mouth 2 (two) times daily. 11/20/21  Yes Ngetich, [name] C, NP  ?polyethylene glycol (MIRALAX / GLYCOLAX) 17 g packet Take 17 g by mouth daily.  11/28/19  Yes Khatri, Hina, PA-C  ?potassium chloride SA (KLOR-CON) 20 MEQ tablet TAKE 2 TABLETS BY MOUTH DAILY WHEN TAKING LASIX ([place]) ?Patient taking differently: Take 20 mEq by mouth 2 (two) times daily. 09/08/20 01/27/22 Yes Reed, Tiffany L, DO  ?cromolyn (OPTICROM) 4 % ophthalmic solution Place 1 drop into both eyes 4 (four) times daily. ?Patient not taking: Reported on 11/30/2021 11/25/18   Ngetich, Nelda Bucks, NP  ? ? ?Scheduled Meds: ? docusate sodium  100 mg Oral BID  ?  furosemide  40 mg Oral BID  ? insulin aspart  0-9 Units Subcutaneous Q4H  ? pantoprazole (PROTONIX) IV  40 mg Intravenous Q12H  ? sodium chloride flush  3 mL Intravenous Q12H  ? ?Continuous Infusions: ? sodium chloride    ? ?PRN Meds:.sodium chloride, acetaminophen **OR** acetaminophen, albuterol, HYDROcodone-acetaminophen, sodium chloride flush ? ?Allergies as of 11/29/2021  ? (No Known Allergies)  ? ? ?Family History  ?Problem Relation Age of Onset  ? Prostate cancer Father   ? Heart failure Mother   ? Lung cancer Daughter   ? Arthritis Sister   ? ? ?Social History  ? ?Socioeconomic History  ? Marital status: Married  ?  Spouse name: Shavar Gorka  ? Number of children: 1  ? Years of education: 18  ? Highest education level: 12th grade  ?Occupational History  ? Occupation: Retired  ?Tobacco Use  ? Smoking status: Never  ? Smokeless tobacco: Current  ?  Types: Chew  ? Tobacco comments:  ?  Chews tobacco daily   ?Vaping Use  ? Vaping Use: Never used  ?Substance and Sexual Activity  ? Alcohol use: No  ? Drug use: No  ? Sexual activity: Not Currently  ?Other Topics Concern  ? Not on file  ?Social History Narrative  ? Not on file  ? ?Social Determinants of Health  ? ?Financial Resource Strain: Not on file  ?Food Insecurity: Not on file  ?Transportation Needs: Not on file  ?Physical Activity: Not on file  ?Stress: Not on file  ?Social Connections: Not on file  ?Intimate Partner Violence: Not on file  ? ? ?Review of Systems: Review of Systems  ?Constitutional:  Positive for malaise/fatigue. Negative for chills and fever.  ?HENT:  Positive for hearing loss. Negative for tinnitus.   ?Eyes:  Negative for blurred vision and double vision.  ?Respiratory:  Positive for shortness of breath. Negative for cough and hemoptysis.   ?Cardiovascular:  Negative for chest pain and palpitations.  ?Gastrointestinal:  Negative for abdominal pain, blood in stool, constipation, diarrhea, heartburn, melena, nausea and vomiting.  ?Genitourinary:   Negative for dysuria and urgency.  ?Musculoskeletal:  Negative for myalgias and neck pain.  ?Skin:  Negative for itching and rash.  ?Neurological:  Negative for dizziness and headaches.  ?Endo/Heme/Allergies:  Negative for environmental allergies. Does not bruise/bleed easily.  ?Psychiatric/Behavioral:  Negative for depression and substance abuse.   ?. ? ?Physical Exam:Physical Exam ?Constitutional:   ?   General: He is not in acute distress. ?   Appearance: He is normal weight.  ?HENT:  ?   Head: Normocephalic and atraumatic.  ?   Right Ear: External ear normal.  ?   Left Ear: External ear normal.  ?   Nose: Nose normal.  ?   Mouth/Throat:  ?   Mouth: Mucous membranes are moist.  ?Eyes:  ?   Pupils: Pupils are equal, round, and reactive to light.  ?   Comments: Conjunctival pallor  ?Cardiovascular:  ?  Rate and Rhythm: Normal rate and regular rhythm.  ?   Pulses: Normal pulses.  ?   Heart sounds: Normal heart sounds.  ?Pulmonary:  ?   Effort: Pulmonary effort is normal.  ?   Breath sounds: Normal breath sounds.  ?Abdominal:  ?   General: Abdomen is flat. Bowel sounds are normal. There is no distension.  ?   Palpations: Abdomen is soft. There is no mass.  ?   Tenderness: There is no abdominal tenderness. There is no guarding or rebound.  ?   Hernia: No hernia is present.  ?Musculoskeletal:     ?   General: Normal range of motion.  ?   Cervical back: Normal range of motion and neck supple.  ?Skin: ?   General: Skin is warm and dry.  ?   Coloration: Skin is pale.  ?Neurological:  ?   General: No focal deficit present.  ?   Mental Status: He is alert and oriented to person, place, and time. Mental status is at baseline.  ?Psychiatric:     ?   Mood and Affect: Mood normal.     ?   Behavior: Behavior normal.  ? ? ?Vital signs: ?Vitals:  ? 11/30/21 0815 11/30/21 0841  ?BP: 111/83 116/71  ?Pulse: 82 75  ?Resp: 16   ?Temp: 98.3 ?F (36.8 ?C) 98.3 ?F (36.8 ?C)  ?SpO2: 100% 100%  ? ?Last BM Date : 11/29/21 ? ? ? ?GI:  ?Lab  Results: ?Recent Labs  ?  11/28/21 ?0945 11/29/21 ?2036 11/30/21 ?7322  ?WBC 5.9 6.5 5.5  ?HGB 6.6* 6.6* 6.3*  ?HCT 22.0* 22.5* 21.6*  ?PLT 206 213 190  ? ?BMET ?Recent Labs  ?  11/28/21 ?0945 11/29/21 ?2036

## 2021-11-30 NOTE — Progress Notes (Signed)
OT Cancellation Note ? ?Patient Details ?Name: Ronnie Jackson ?MRN: [id number] ?DOB: 01-31-1936 ? ? ?Cancelled Treatment:    Reason Eval/Treat Not Completed: Pain limiting ability to participate. Reports his feet are burning and would rather walk later. ? ?Lenward Chancellor ?11/30/2021, 3:21 PM ?

## 2021-11-30 NOTE — Progress Notes (Signed)
?  Echocardiogram ?2D Echocardiogram has been performed. ? ?Ronnie Jackson  [name] ?11/30/2021, 10:39 AM ?

## 2021-12-01 ENCOUNTER — Observation Stay (HOSPITAL_COMMUNITY): Payer: HMO | Admitting: Anesthesiology

## 2021-12-01 ENCOUNTER — Encounter (HOSPITAL_COMMUNITY): Payer: Self-pay | Admitting: Internal Medicine

## 2021-12-01 ENCOUNTER — Encounter (HOSPITAL_COMMUNITY)
Admission: EM | Disposition: A | Discharge status: Home / Self Care | Payer: Self-pay | Source: Home / Self Care | Attending: Emergency Medicine

## 2021-12-01 ENCOUNTER — Observation Stay (HOSPITAL_BASED_OUTPATIENT_CLINIC_OR_DEPARTMENT_OTHER): Payer: HMO | Admitting: Anesthesiology

## 2021-12-01 DIAGNOSIS — I10 Essential (primary) hypertension: Secondary | ICD-10-CM

## 2021-12-01 DIAGNOSIS — Z7901 Long term (current) use of anticoagulants: Secondary | ICD-10-CM | POA: Diagnosis not present

## 2021-12-01 DIAGNOSIS — K3189 Other diseases of stomach and duodenum: Secondary | ICD-10-CM | POA: Diagnosis not present

## 2021-12-01 DIAGNOSIS — D649 Anemia, unspecified: Secondary | ICD-10-CM | POA: Diagnosis not present

## 2021-12-01 DIAGNOSIS — D509 Iron deficiency anemia, unspecified: Secondary | ICD-10-CM | POA: Diagnosis not present

## 2021-12-01 DIAGNOSIS — I4891 Unspecified atrial fibrillation: Secondary | ICD-10-CM | POA: Diagnosis not present

## 2021-12-01 DIAGNOSIS — I4819 Other persistent atrial fibrillation: Secondary | ICD-10-CM | POA: Diagnosis not present

## 2021-12-01 DIAGNOSIS — E119 Type 2 diabetes mellitus without complications: Secondary | ICD-10-CM | POA: Diagnosis not present

## 2021-12-01 DIAGNOSIS — K2950 Unspecified chronic gastritis without bleeding: Secondary | ICD-10-CM | POA: Diagnosis not present

## 2021-12-01 DIAGNOSIS — E7800 Pure hypercholesterolemia, unspecified: Secondary | ICD-10-CM | POA: Diagnosis not present

## 2021-12-01 HISTORY — PX: BIOPSY: SHX5522

## 2021-12-01 HISTORY — PX: ESOPHAGOGASTRODUODENOSCOPY: SHX5428

## 2021-12-01 LAB — TYPE AND SCREEN
ABO/RH(D): O NEG
Antibody Screen: NEGATIVE
Unit division: 0
Unit division: 0

## 2021-12-01 LAB — BPAM RBC
Blood Product Expiration Date: 202306072359
Blood Product Expiration Date: 202306072359
ISSUE DATE / TIME: 202305110335
ISSUE DATE / TIME: 202305110807
Unit Type and Rh: 9500
Unit Type and Rh: 9500

## 2021-12-01 LAB — CBC
HCT: 24.5 % — ABNORMAL LOW (ref 39.0–52.0)
Hemoglobin: 7.7 g/dL — ABNORMAL LOW (ref 13.0–17.0)
MCH: 26.5 pg (ref 26.0–34.0)
MCHC: 31.4 g/dL (ref 30.0–36.0)
MCV: 84.2 fL (ref 80.0–100.0)
Platelets: 157 10*3/uL (ref 150–400)
RBC: 2.91 MIL/uL — ABNORMAL LOW (ref 4.22–5.81)
RDW: 15.9 % — ABNORMAL HIGH (ref 11.5–15.5)
WBC: 6.2 10*3/uL (ref 4.0–10.5)
nRBC: 0 % (ref 0.0–0.2)

## 2021-12-01 LAB — GLUCOSE, CAPILLARY
Glucose-Capillary: 94 mg/dL (ref 70–99)
Glucose-Capillary: 90 mg/dL (ref 70–99)
Glucose-Capillary: 94 mg/dL (ref 70–99)

## 2021-12-01 SURGERY — EGD (ESOPHAGOGASTRODUODENOSCOPY)
Anesthesia: Monitor Anesthesia Care

## 2021-12-01 MED ORDER — PROPOFOL 10 MG/ML IV BOLUS
INTRAVENOUS | Status: DC | PRN
  Administered 2021-12-01 (×2): 30 mg via INTRAVENOUS
  Administered 2021-12-01: 50 mg via INTRAVENOUS

## 2021-12-01 MED ORDER — PANTOPRAZOLE SODIUM 40 MG PO TBEC
40.0000 mg | DELAYED_RELEASE_TABLET | Freq: Every day | ORAL | 0 refills | Status: DC
Start: 2021-12-01 — End: 2022-01-02

## 2021-12-01 MED ORDER — PROPOFOL 10 MG/ML IV BOLUS
INTRAVENOUS | Status: AC
  Filled 2021-12-01: qty 20

## 2021-12-01 MED ORDER — LIDOCAINE 2% (20 MG/ML) 5 ML SYRINGE
INTRAMUSCULAR | Status: DC | PRN
  Administered 2021-12-01: 100 mg via INTRAVENOUS

## 2021-12-01 MED ORDER — LACTATED RINGERS IV SOLN: INTRAVENOUS | Status: DC | PRN

## 2021-12-01 MED ORDER — APIXABAN 5 MG PO TABS: 5.0000 mg | ORAL_TABLET | Freq: Two times a day (BID) | ORAL | Status: DC

## 2021-12-01 NOTE — Anesthesia Postprocedure Evaluation (Signed)
Anesthesia Post Note ? ?Patient: Ronnie Jackson ? ?Procedure(s) Performed: ESOPHAGOGASTRODUODENOSCOPY (EGD) ? ?  ? ?Patient location during evaluation: PACU ?Anesthesia Type: MAC ?Level of consciousness: awake and alert ?Pain management: pain level controlled ?Vital Signs Assessment: post-procedure vital signs reviewed and stable ?Respiratory status: spontaneous breathing and respiratory function stable ?Cardiovascular status: stable ?Postop Assessment: no apparent nausea or vomiting ?Anesthetic complications: no ? ? ?No notable events documented. ? ?Last Vitals:  ?Vitals:  ? 12/01/21 0930 12/01/21 0955  ?BP: 117/73 115/87  ?Pulse: 67 (!) 59  ?Resp: 17 12  ?Temp:  36.7 ?C  ?SpO2: 99% 98%  ?  ?Last Pain:  ?Vitals:  ? 12/01/21 0930  ?TempSrc:   ?PainSc: 0-No pain  ? ? ?  ?  ?  ?  ?  ?  ? ?[name] ? ? ? ? ?

## 2021-12-01 NOTE — Transfer of Care (Signed)
Immediate Anesthesia Transfer of Care Note ? ?Patient: Ronnie Jackson ? ?Procedure(s) Performed: ESOPHAGOGASTRODUODENOSCOPY (EGD) ? ?Patient Location: Endoscopy Unit ? ?Anesthesia Type:MAC ? ?Level of Consciousness: drowsy ? ?Airway & Oxygen Therapy: Patient Spontanous Breathing and Patient connected to face mask oxygen ? ?Post-op Assessment: Report given to RN and Post -op Vital signs reviewed and stable ? ?Post vital signs: Reviewed and stable ? ?Last Vitals:  ?Vitals Value Taken Time  ?BP 122/79 12/01/21 0914  ?Temp 36.7 ?C 12/01/21 0914  ?Pulse 59 12/01/21 0916  ?Resp 14 12/01/21 0916  ?SpO2 100 % 12/01/21 0916  ?Vitals shown include unvalidated device data. ? ?Last Pain:  ?Vitals:  ? 12/01/21 0914  ?TempSrc: Temporal  ?PainSc: Asleep  ?   ? ?  ? ?Complications: No notable events documented. ?

## 2021-12-01 NOTE — Progress Notes (Signed)
Patient refuses to have a colonoscopy. ?I discussed in details with the patient that, he was admitted with severe iron deficiency anemia of unknown cause without melena or hematochezia. ?His endoscopy showed some inflammation in the fundus but no evidence of active bleeding to explain severe iron deficiency anemia. ? ?Discussed with patient that he may have a colonic lesion, bleeding polyp or even a colon mass, AVM, which could cause severe unexplained iron deficiency anemia. ?Despite detailed information given to the patient regarding need for diagnostic colonoscopy, he absolutely refuses the procedure. ?His granddaughter, Arrie Aran, was present at bedside, during this discussion.  She spoke with him regarding the same but he continues to refuse colonoscopy. ? ?I spoke with patient's hospitalist Dr. Eleonore Chiquito and discussed the same. ?I cannot give approval for resuming Eliquis, as I am uncertain as to because of severe iron deficiency anemia, and whether it is related to acute colonic issue. ? ?As such, I recommend that patient can be discharged home today, but the Eliquis will be on hold for at least 5 days. ?Recommend getting CBC and iron panel with his primary care physician in 5 days, if hemoglobin and iron panel stable, okay to resume Eliquis at that point. ? ?I discussed with patient that it is important for him to have a colonoscopy. ?He refuses the procedure despite detailed conversation about the need for colonoscopy. ?

## 2021-12-01 NOTE — Anesthesia Preprocedure Evaluation (Addendum)
Anesthesia Evaluation  ?Patient identified by MRN, date of birth, ID band ?Patient awake ? ? ? ?Reviewed: ?Allergy & Precautions, NPO status , Patient's Chart, lab work & pertinent test results ? ?Airway ?Mallampati: II ? ?TM Distance: >3 FB ?Neck ROM: Full ? ? ? Dental ?no notable dental hx. ? ?  ?Pulmonary ?neg pulmonary ROS,  ?  ?Pulmonary exam normal ?breath sounds clear to auscultation ? ? ? ? ? ? Cardiovascular ?hypertension, Normal cardiovascular exam+ dysrhythmias (on eliquis) Atrial Fibrillation  ?Rhythm:Regular Rate:Normal ? ?Atrial fibrillation ?Low voltage, precordial leads ?Nonspecific T abnormalities, lateral leads ?Confirmed by Nanda Quinton [phone]) on 11/29/2021 7:53:50 PM ? ?11/30/21 ECHO: ?Left ventricular ejection fraction, by estimation, is 40 to 45%. Left ventricular ejection fraction by 2D MOD biplane is 40.2 %. The left ventricle has mildly decreased function. ?The left ventricle demonstrates global hypokinesis. There is mild left ventricular hypertrophy. Left ventricular diastolic function could not be evaluated. Elevated left ?ventricular end-diastolic pressure. The E/e' is 59. ?1. ?Right ventricular systolic function is low normal. The right ventricular size is normal. ?There is moderately elevated pulmonary artery systolic pressure. The estimated right ?ventricular systolic pressure is 67.3 mmHg. ?2. ?3. Left atrial size was moderately dilated. ?4. Right atrial size was mildly dilated. ?5. The mitral valve is abnormal. Trivial mitral valve regurgitation. ?The aortic valve is tricuspid. Aortic valve regurgitation is not visualized. Aortic valve ?sclerosis/calcification is present, without any evidence of aortic stenosis. ?6. ?Aortic dilatation noted. There is borderline dilatation of the aortic root, measuring 38 ?mm. There is mild dilatation of the ascending aorta, measuring 42 mm. There is mild ?(Grade II) calcified plaque plaque involving the aortic  root. ?7. ?The inferior vena cava is dilated in size with <50% respiratory variability, suggesting ?right atrial pressure of 15 mmHg. ?  ?Neuro/Psych ?PSYCHIATRIC DISORDERS Anxiety Depression  Neuromuscular disease (myalgia/myositis; peripheral neuropathy)   ? GI/Hepatic ?negative GI ROS, Neg liver ROS,   ?Endo/Other  ?negative endocrine ROSdiabetes, Type 2 ? Renal/GU ?Renal InsufficiencyRenal disease  ?negative genitourinary ?  ?Musculoskeletal ? ?(+) Arthritis , Rheumatoid disorders,  Chronic pain  ? Abdominal ?  ?Peds ?negative pediatric ROS ?(+)  Hematology ? ?(+) Blood dyscrasia, anemia ,   ?Anesthesia Other Findings ? ? Reproductive/Obstetrics ? ?  ? ? ? ? ? ? ? ? ? ? ? ? ? ?  ?  ? ? ? ? ? ? ? ?Anesthesia Physical ?Anesthesia Plan ? ?ASA: 3 ? ?Anesthesia Plan: MAC  ? ?Post-op Pain Management: Minimal or no pain anticipated  ? ?Induction: Intravenous ? ?PONV Risk Score and Plan: 1 and Propofol infusion and TIVA ? ?Airway Management Planned: Natural Airway ? ?Additional Equipment: None ? ?Intra-op Plan:  ? ?Post-operative Plan: Extubation in OR ? ?Informed Consent: I have reviewed the patients History and Physical, chart, labs and discussed the procedure including the risks, benefits and alternatives for the proposed anesthesia with the patient or authorized representative who has indicated his/her understanding and acceptance.  ? ? ? ?Dental advisory given ? ?Plan Discussed with: CRNA, Anesthesiologist and Surgeon ? ?Anesthesia Plan Comments:   ? ? ? ? ? ? ?Anesthesia Quick Evaluation ? ?

## 2021-12-01 NOTE — Evaluation (Signed)
Physical Therapy Evaluation ?Patient Details ?Name: Ronnie Jackson ?MRN: [id number] ?DOB: [birth year] ?Today's Date: 12/01/2021 ? ?History of Present Illness ? Pt is an 86yo male presenting to [facility] ED on 5/10 from PCP with HgB of 6.6, had endoscopy 5/12 with "some inflammation" but source of bleeding remains unknown, pt refused colonoscopy. PMH: a.fib on eliquis, DM2, HLD, constipation, Rheumatoid arthritis with rheumatoid factor, Prostate cancer, HTN, R-THA 2019, chews tobacco.  ?Clinical Impression ?  ?Pt presents with the problems above and functional impairments listed below; patient evaluated by Physical Therapy with no further acute PT needs identified, pt feels he is at his baseline level of function other than weakness. Pt modified independent for transfers and supervision for 325f ambulation in hallway, recommended pt use SPC for ambulation at home for increased safety and stability, pt agreeable. Pt completed Dynamic Gait Index with a score of 20/24 indicating he is not at risk for falls at this time. All education has been completed and the patient has no further questions.  See below for any follow-up Physical Therapy or equipment needs. PT is signing off. Thank you for this referral. ?   ?   ? ?Recommendations for follow up therapy are one component of a multi-disciplinary discharge planning process, led by the attending physician.  Recommendations may be updated based on patient status, additional functional criteria and insurance authorization. ? ?Follow Up Recommendations No PT follow up ? ?  ?Assistance Recommended at Discharge PRN  ?Patient can return home with the following ? Assist for transportation;Direct supervision/assist for medications management;Direct supervision/assist for financial management;Assistance with cooking/housework ? ?  ?Equipment Recommendations None recommended by PT  ?Recommendations for Other Services ?    ?  ?Functional Status Assessment Patient has not had a recent decline  in their functional status  ? ?  ?Precautions / Restrictions Precautions ?Precautions: Fall ?Restrictions ?Weight Bearing Restrictions: No  ? ?  ? ?Mobility ? Bed Mobility ?  ?  ?  ?  ?  ?  ?  ?General bed mobility comments: pt seated in recliner upon entry and exit and reports inability to lay flat in bed secondary to pain. Reports he "rests" in recliner as back pain limits ability to sleep ?  ? ?Transfers ?Overall transfer level: Modified independent ?Equipment used: None ?  ?  ?  ?  ?  ?  ?  ?General transfer comment: mod I for extra time ?  ? ?Ambulation/Gait ?Ambulation/Gait assistance: Supervision ?Gait Distance (Feet): 320 Feet ?Assistive device: Rolling walker (2 wheels), None ?Gait Pattern/deviations: WFL(Within Functional Limits) ?Gait velocity: decreased ?  ?  ?General Gait Details: Pt ambulated with RW for first ~658fof gait but without AD for remainder of ambulation task, no overt LOB noted, supervision only. Pt completed DGI task without overt LOB or reduction in gait speed. Pt with one standing rest break ~71m571m VSS throughout ambulation task via pulse ox. [facility] post-discharge, pt agreeable. ? ?Stairs ?  ?  ?  ?  ?  ? ?Wheelchair Mobility ?  ? ?Modified Rankin (Stroke Patients Only) ?  ? ?  ? ?Balance Overall balance assessment: Modified Independent ?  ?  ?  ?  ?  ?  ?  ?  ?  ?  ?  ?  ?  ?  ?  ?Standardized Balance Assessment ?Standardized Balance Assessment : Dynamic Gait Index ?  ?Dynamic Gait Index ?Level Surface: Normal ?Change in Gait Speed: Mild Impairment ?Gait with Horizontal  Head Turns: Mild Impairment ?Gait with Vertical Head Turns: Mild Impairment ?Gait and Pivot Turn: Normal ?Step Over Obstacle: Normal ?Step Around Obstacles: Normal ?Steps: Mild Impairment ?Total Score: 20 ?   ? ? ? ?Pertinent Vitals/Pain Pain Assessment ?Pain Assessment: No/denies pain  ? ? ?Home Living Family/patient expects to be discharged to:: Private residence ?Living  Arrangements: Children (Lives with daughter who is post-CVA and pt actually helps with her care) ?Available Help at Discharge: Family;Available 24 hours/day ?Type of Home: House ?Home Access: Level entry ?  ?  ?  ?Home Layout: One level ?Home Equipment: Kasandra Knudsen - single point ?Additional Comments: granddaughter Tilda Burrow present for session and confirmed home environment and history  ?  ?Prior Function Prior Level of Function : Independent/Modified Independent (no falls in the last three months, no fear of falling.) ?  ?  ?  ?  ?  ?  ?Mobility Comments: IND ?ADLs Comments: IND ?  ? ? ?Hand Dominance  ?   ? ?  ?Extremity/Trunk Assessment  ? Upper Extremity Assessment ?Upper Extremity Assessment: RUE deficits/detail;LUE deficits/detail ?RUE Deficits / Details: Pt unable to acheive shoulder flexion past 90deg secondary to cervical and rotator cuff issues, known limitation PTA. ?RUE Sensation: WNL ?LUE Deficits / Details: Pt unable to acheive shoulder flexion past 90deg secondary to cervical and rotator cuff issues, known limitation PTA. ?LUE Sensation: WNL ?  ? ?Lower Extremity Assessment ?Lower Extremity Assessment: Overall WFL for tasks assessed ?  ? ?Cervical / Trunk Assessment ?Cervical / Trunk Assessment: Kyphotic  ?Communication  ? Communication: HOH (hearing aid R ear)  ?Cognition Arousal/Alertness: Awake/alert ?Behavior During Therapy: [facility] for tasks assessed/performed ?Overall Cognitive Status: Within Functional Limits for tasks assessed ?  ?  ?  ?  ?  ?  ?  ?  ?  ?  ?  ?  ?  ?  ?  ?  ?  ?  ?  ? ?  ?General Comments General comments (skin integrity, edema, etc.): Pt reports some decreased balance especially with getting up quickly or using a ladder. Recommended avoiding ladders. Educated pt about pacing during positional changes especially as history of eliquis use, pt verbalized understanding. Also educated pt that if he feels weak again to go see his PCP again, pt and granddaughter agreeable. ? ?  ?Exercises     ? ?Assessment/Plan  ?  ?PT Assessment Patient does not need any further PT services  ?PT Problem List Decreased balance;Pain;Decreased safety awareness;Decreased knowledge of use of DME ? ?   ?  ?PT Treatment Interventions DME instruction;Gait training;Stair training;Functional mobility training;Therapeutic activities;Therapeutic exercise;Patient/family education;Neuromuscular re-education;Balance training   ? ?PT Goals (Current goals can be found in the Care Plan section)  ?Acute Rehab PT Goals ?Patient Stated Goal: to get stronger ?PT Goal Formulation: With patient ?Time For Goal Achievement: 12/01/21 ?Potential to Achieve Goals: Good ? ?  ?Frequency   ?  ? ? ?Co-evaluation   ?  ?  ?  ?  ? ? ?  ?AM-PAC PT "6 Clicks" Mobility  ?Outcome Measure Help needed turning from your back to your side while in a flat bed without using bedrails?: None ?Help needed moving from lying on your back to sitting on the side of a flat bed without using bedrails?: None ?Help needed moving to and from a bed to a chair (including a wheelchair)?: A Little ?Help needed standing up from a chair using your arms (e.g., wheelchair or bedside chair)?: A Little ?Help needed to walk in hospital  room?: A Little ?Help needed climbing 3-5 steps with a railing? : A Little ?6 Click Score: 20 ? ?  ?End of Session Equipment Utilized During Treatment: Gait belt ?Activity Tolerance: Patient tolerated treatment well;No increased pain ?Patient left: in chair;with family/visitor present ?Nurse Communication: Mobility status ?PT Visit Diagnosis: Difficulty in walking, not elsewhere classified (R26.2) ?  ? ?Time: 1101-1130 ?PT Time Calculation (min) (ACUTE ONLY): 29 min ? ? ?Charges:   PT Evaluation ?$PT Eval Low Complexity: 1 Low ?PT Treatments ?$Gait Training: 8-22 mins ?  ?   ? ? ?Coolidge Breeze, PT, DPT ?[facility] ?Office: [phone] ?Pager: [phone] ? ?Coolidge Breeze ?12/01/2021, 11:44 AM ? ?

## 2021-12-01 NOTE — Discharge Summary (Signed)
?Physician Discharge Summary ?  ?Patient: Ronnie Jackson MRN: [id number] DOB: July 28, 1935  ?Admit date:     11/29/2021  ?Discharge date: 12/01/21  ?Discharge Physician: Oswald Hillock  ? ?PCP: Ngetich, Nelda Bucks, NP  ? ?Recommendations at discharge:  ? ?Follow-up PCP in 5 days ?Recommend holding Eliquis for now, until he gets  ?                          repeat labs for CBC and Iron panel with his PCP in  ?                          5 days. ?                          - If Hb is stable, may resume Eliquis at that point. ?Discharge Diagnoses: ?Principal Problem: ?  Symptomatic anemia ?Active Problems: ?  Edema ?  DM type 2 with diabetic peripheral neuropathy ([place]) ?  Hyperlipemia ?  Persistent atrial fibrillation ([place]) ?  Hypokalemia ? ?Resolved Problems: ?  * No resolved hospital problems. * ? ?Hospital Course: ?86 year old male with history of atrial fibrillation on Eliquis, diabetes mellitus type 2, hyperlipidemia, constipation, remote arthritis, prostate cancer presented with low hemoglobin.  Patient has been having generalized fatigue.  Denies melena, no blood in stool.  He has been taking Eliquis.  In the ED he was found to have severe anemia with hemoglobin 6.6.  Transfused 2 units PRBC.  Gastroenterology was consulted.  FOBT was positive. ? ?Assessment and Plan: ? ?Severe iron deficiency anemia ?-Serum iron 16, saturation 3%, ferritin 7 ?-S/p 2 units PRBC, hemoglobin this morning is 7.7 ?-FOBT is positive ?-Gastroenterology consulted, underwent EGD ?-Found to have erythema in the gastric fundus ?-GI recommends to take Protonix 40 mg daily for 1 month ?-Patient was offered colonoscopy however he declined ?-Wants to go home ?-Gastroenterology Recommend holding Eliquis for now, until he gets  ?                          repeat labs for CBC and Iron panel with his PCP in  ?                          5 days. ?                          - If Hb is stable, may resume Eliquis at that point. ?  ?Diabetes mellitus type  2 ?-Hemoglobin A1c is 6.2 ?-Not on medications at home ?  ?Bilateral lower extremity edema/chronic systolic CHF ?-Patient has been on Lasix 40 mg p.o. twice daily ?-Echocardiogram shows EF of 40 to 45%, left ventricle demonstrates global hypokinesis ?  ?Persistent atrial fibrillation ?-Eliquis on hold as above ?-Heart rate is controlled ?  ?Hypokalemia ?-Replete ?  ?CKD stage IIIb ?-Creatinine at baseline ?  ? ? ?  ? ? ?Consultants: Gastroenterology ?Procedures performed: EGD ?Disposition: Home ?Diet recommendation:  ?Discharge Diet Orders (From admission, onward)  ? ?  Start     Ordered  ? 12/01/21 0000  Diet - low sodium heart healthy       ? 12/01/21 1155  ? ?  ?  ? ?  ? ?Regular diet ?DISCHARGE MEDICATION: ?Allergies as of 12/01/2021   ?  No Known Allergies ?  ? ?  ?Medication List  ?  ? ?TAKE these medications   ? ?apixaban 5 MG Tabs tablet ?Commonly known as: ELIQUIS ?Take 1 tablet (5 mg total) by mouth 2 (two) times daily. Hold Eliquis for 5 days till 12/06/2021, check CBC and iron panel at PCP office.  If hemoglobin stable, can restart taking Eliquis. ?What changed:  ?how much to take ?how to take this ?when to take this ?additional instructions ?  ?cromolyn 4 % ophthalmic solution ?Commonly known as: OPTICROM ?Place 1 drop into both eyes 4 (four) times daily. ?  ?furosemide 40 MG tablet ?Commonly known as: LASIX ?Take Two tablet by mouth in the morning and one tablet by mouth in the afternoon daily.x 3 days then resume one by tablet by mouth twice daily. ?What changed:  ?how much to take ?how to take this ?when to take this ?additional instructions ?  ?pantoprazole 40 MG tablet ?Commonly known as: Protonix ?Take 1 tablet (40 mg total) by mouth daily. ?  ?polyethylene glycol 17 g packet ?Commonly known as: MIRALAX / GLYCOLAX ?Take 17 g by mouth daily. ?  ?potassium chloride SA 20 MEQ tablet ?Commonly known as: KLOR-CON M ?TAKE 2 TABLETS BY MOUTH DAILY WHEN TAKING LASIX (FUROSMIDE) ?What changed:  ?how much to  take ?how to take this ?when to take this ?  ? ?  ? ? Follow-up Information   ? ? Ngetich, [name] C, NP Follow up in 5 day(s).   ?Specialty: Family Medicine ?Why: Recommend holding Eliquis for now, until he gets  ?                          repeat labs for CBC and Iron panel with his PCP in  ?                          5 days. ?                          - If Hb is stable, may resume Eliquis at that point. ?Contact information: ?[address] ?[place] [address] ?[phone] ? ? ?  ?  ? ? Camnitz, Ocie Doyne, MD .   ?Specialty: Cardiology ?Contact information: ?[address] ?[address] ?[place] [address] ?[phone] ? ? ?  ?  ? ?  ?  ? ?  ? ?Discharge Exam: ?Filed Weights  ? 11/29/21 1853 12/01/21 0826  ?Weight: 79.4 kg 79.4 kg  ? ?General-appears in no acute distress ?Heart-S1-S2, regular, no murmur auscultated ?Lungs-clear to auscultation bilaterally, no wheezing or crackles auscultated ?Abdomen-soft, nontender, no organomegaly ?Extremities-no edema in the lower extremities ?Neuro-alert, oriented x3, no focal deficit noted ? ?Condition at discharge: good ? ?The results of significant diagnostics from this hospitalization (including imaging, microbiology, ancillary and laboratory) are listed below for reference.  ? ?Imaging Studies: ?ECHOCARDIOGRAM COMPLETE ? ?Result Date: 11/30/2021 ?   ECHOCARDIOGRAM REPORT   Patient Name:   Ronnie Jackson Date of Exam: 11/30/2021 Medical Rec #:  [id number]      Height:       68.0 in Accession #:    [id number]     Weight:       175.0 lb Date of Birth:  1936/06/26      BSA:          1.931 m? Patient Age:  86 years       BP:           112/61 mmHg Patient Gender: M              HR:           74 bpm. Exam Location:  Inpatient Procedure: 2D Echo, Cardiac Doppler and Color Doppler Indications:    Dyspnea  History:        Patient has prior history of Echocardiogram examinations, most                 recent 04/19/2016. Risk Factors:Diabetes and HLD.  Sonographer:    Joette Catching RCS  Referring Phys: [place]  1. Left ventricular ejection fraction, by estimation, is 40 to 45%. Left ventricular ejection fraction by 2D MOD biplane is 40.2 %. The left ventricle has mildly decreased function. The left ventricle demonstrates global hypokinesis. There is mild left ventricular hypertrophy. Left ventricular diastolic function could not be evaluated. Elevated left ventricular end-diastolic pressure. The E/e' is 75.  2. Right ventricular systolic function is low normal. The right ventricular size is normal. There is moderately elevated pulmonary artery systolic pressure. The estimated right ventricular systolic pressure is 29.7 mmHg.  3. Left atrial size was moderately dilated.  4. Right atrial size was mildly dilated.  5. The mitral valve is abnormal. Trivial mitral valve regurgitation.  6. The aortic valve is tricuspid. Aortic valve regurgitation is not visualized. Aortic valve sclerosis/calcification is present, without any evidence of aortic stenosis.  7. Aortic dilatation noted. There is borderline dilatation of the aortic root, measuring 38 mm. There is mild dilatation of the ascending aorta, measuring 42 mm. There is mild (Grade II) calcified plaque plaque involving the aortic root.  8. The inferior vena cava is dilated in size with <50% respiratory variability, suggesting right atrial pressure of 15 mmHg. Comparison(s): Changes from prior study are noted. 04/19/2016: LVEF 65-70%, mild LVH, ascending aorta measured 41 mm. FINDINGS  Left Ventricle: Left ventricular ejection fraction, by estimation, is 40 to 45%. Left ventricular ejection fraction by 2D MOD biplane is 40.2 %. The left ventricle has mildly decreased function. The left ventricle demonstrates global hypokinesis. The left ventricular internal cavity size was normal in size. There is mild left ventricular hypertrophy. Left ventricular diastolic function could not be evaluated due to atrial fibrillation. Left  ventricular diastolic function could not be evaluated. Elevated left ventricular end-diastolic pressure. The E/e' is 29. Right Ventricle: The right ventricular size is normal. No increase in right ventricular wall thick

## 2021-12-01 NOTE — Progress Notes (Signed)
OT Cancellation Note ? ?Patient Details ?Name: Ronnie Jackson ?MRN: [id number] ?DOB: 1935/11/04 ? ? ?Cancelled Treatment:    Reason Eval/Treat Not Completed: Patient at procedure or test/ unavailable. Patient at EGD. Will re-attempt as schedule permits. ? ?Delbert Phenix OT ?OT pager: [phone] ? ? ?Rosemary Holms ?12/01/2021, 9:18 AM ?

## 2021-12-01 NOTE — Progress Notes (Signed)
OT Cancellation Note ? ?Patient Details ?Name: Ronnie Jackson ?MRN: [id number] ?DOB: 1936/06/10 ? ? ?Cancelled Treatment:    Reason Eval/Treat Not Completed: OT screened, no needs identified, will sign off. Confirmed with PT that patient is at his baseline with mobility, ADLs. No needs will sign off ? ?Delbert Phenix OT ?OT pager: [phone] ? ? ?Rosemary Holms ?12/01/2021, 12:09 PM ?

## 2021-12-01 NOTE — Interval H&P Note (Signed)
History and Physical Interval Note: ?86/male with severe symptomatic anemia, FOBT positive stool, last dose of eliquis on 11/29/21 for an EGD. ? ?12/01/2021 ?8:35 AM ? ?Earnestine Leys  has presented today for EGD, with the diagnosis of symptomatic anemia, positive FOBT.  The various methods of treatment have been discussed with the patient and family. After consideration of risks, benefits and other options for treatment, the patient has consented to  Procedure(s): ?ESOPHAGOGASTRODUODENOSCOPY (EGD) (N/A) as a surgical intervention.  The patient's history has been reviewed, patient examined, no change in status, stable for surgery.  I have reviewed the patient's chart and labs.  Questions were answered to the patient's satisfaction.   ? ? ?Ronnette Juniper ? ? ?

## 2021-12-01 NOTE — Op Note (Signed)
[facility] ?Patient Name: Ronnie Jackson ?Procedure Date: 12/01/2021 ?MRN: [id number] ?Attending MD: Ronnette Juniper , MD ?Date of Birth: 1936/04/10 ?CSN: [id number] ?Age: 86 ?Admit Type: Inpatient ?Procedure:                Upper GI endoscopy ?Indications:              Unexplained iron deficiency anemia ?Providers:                Ronnette Juniper, MD, Dulcy Fanny, Janie Billups,  ?                          Technician, [employer], CRNA ?Referring MD:             [facility] ?Medicines:                Monitored Anesthesia Care ?Complications:            No immediate complications. Estimated blood loss:  ?                          Minimal. ?Estimated Blood Loss:     Estimated blood loss was minimal. ?Procedure:                Pre-Anesthesia Assessment: ?                          - Prior to the procedure, a History and Physical  ?                          was performed, and patient medications and  ?                          allergies were reviewed. The patient's tolerance of  ?                          previous anesthesia was also reviewed. The risks  ?                          and benefits of the procedure and the sedation  ?                          options and risks were discussed with the patient.  ?                          All questions were answered, and informed consent  ?                          was obtained. Prior Anticoagulants: The patient has  ?                          taken Eliquis (apixaban), last dose was 2 days  ?                          prior to procedure. ASA Grade Assessment: III - A  ?  patient with severe systemic disease. After  ?                          reviewing the risks and benefits, the patient was  ?                          deemed in satisfactory condition to undergo the  ?                          procedure. ?                          After obtaining informed consent, the endoscope was  ?                          passed under direct  vision. Throughout the  ?                          procedure, the patient's blood pressure, pulse, and  ?                          oxygen saturations were monitored continuously. The  ?                          GIF-H190 ([id number]) Olympus endoscope was introduced  ?                          through the mouth, and advanced to the third part  ?                          of duodenum. The upper GI endoscopy was  ?                          accomplished without difficulty. The patient  ?                          tolerated the procedure well. ?Scope In: ?Scope Out: ?Findings: ?     The examined esophagus was normal. ?     The Z-line was regular and was found 40 cm from the incisors. ?     Diffuse moderate mucosal changes characterized by congestion and  ?     erythema were found in the gastric fundus. Biopsies were taken with a  ?     cold forceps for histology. ?     The greater curvature of the stomach, lesser curvature of the stomach,  ?     incisura, gastric antrum and pylorus were normal. ?     The examined duodenum was normal. ?Impression:               - Normal esophagus. ?                          - Z-line regular, 40 cm from the incisors. ?                          - Congested and erythematous mucosa in the  gastric  ?                          fundus. Biopsied. ?                          - Normal greater curvature of the stomach, lesser  ?                          curvature of the stomach, incisura, antrum and  ?                          pylorus. ?                          - Normal examined duodenum. ?Moderate Sedation: ?     Patient did not receive moderate sedation for this procedure, but  ?     instead received monitored anesthesia care. ?Recommendation:           - Resume regular diet. ?                          - Continue present medications. Pantoprazole 40 mg  ?                          po daily for 1 month. ?                          - Patient refuses to have a colonoscopy. ?                          -  Recommend holding Eliquis for now, until he gets  ?                          repeat labs for CBC and Iron panel with his PCP in  ?                          5 days. ?                          - If Hb is stable, may resume Eliquis at that point. ?                          - Await pathology results. ?Procedure Code(s):        --- Professional --- ?                          [phone], Esophagogastroduodenoscopy, flexible,  ?                          transoral; with biopsy, single or multiple ?Diagnosis Code(s):        --- Professional --- ?                          K31.89, Other diseases of stomach and duodenum ?  D50.9, Iron deficiency anemia, unspecified ?CPT copyright 2019 [employer]. All rights reserved. ?The codes documented in this report are preliminary and upon coder review may  ?be revised to meet current compliance requirements. ?Ronnette Juniper, MD ?12/01/2021 9:14:56 AM ?This report has been signed electronically. ?Number of Addenda: 0 ?

## 2021-12-04 ENCOUNTER — Encounter (HOSPITAL_COMMUNITY): Payer: Self-pay | Admitting: Gastroenterology

## 2021-12-05 ENCOUNTER — Encounter: Payer: Self-pay | Admitting: Family

## 2021-12-05 ENCOUNTER — Ambulatory Visit (INDEPENDENT_AMBULATORY_CARE_PROVIDER_SITE_OTHER): Payer: HMO | Admitting: Family

## 2021-12-05 VITALS — BP 120/80 | HR 90 | Temp 97.7°F | Resp 16 | Ht 68.0 in | Wt 193.1 lb

## 2021-12-05 DIAGNOSIS — N1832 Chronic kidney disease, stage 3b: Secondary | ICD-10-CM

## 2021-12-05 DIAGNOSIS — I5022 Chronic systolic (congestive) heart failure: Secondary | ICD-10-CM

## 2021-12-05 DIAGNOSIS — I4819 Other persistent atrial fibrillation: Secondary | ICD-10-CM | POA: Diagnosis not present

## 2021-12-05 DIAGNOSIS — D509 Iron deficiency anemia, unspecified: Secondary | ICD-10-CM | POA: Diagnosis not present

## 2021-12-05 DIAGNOSIS — R600 Localized edema: Secondary | ICD-10-CM

## 2021-12-05 DIAGNOSIS — E1142 Type 2 diabetes mellitus with diabetic polyneuropathy: Secondary | ICD-10-CM | POA: Diagnosis not present

## 2021-12-05 LAB — SURGICAL PATHOLOGY

## 2021-12-05 NOTE — Progress Notes (Signed)
Provider: Marlowe Sax FNP-C  Ronnie Jackson, Ronnie Bucks, NP  Patient Care Team: Rache Klimaszewski, Ronnie Bucks, NP as PCP - General (Family Medicine) Constance Haw, MD as PCP - Cardiology (Cardiology) Tomasita Morrow, NP as Nurse Practitioner (Cardiology) Deneise Lever, MD as Consulting Physician (Pulmonary Disease) Kristeen Miss, MD as Consulting Physician (Neurosurgery) Ronny Bacon, MD as Referring Physician (Surgery) [name], Fransico Him., MD (General Surgery) Bo Merino, MD as Consulting Physician (Rheumatology) Clent Jacks, MD as Consulting Physician (Ophthalmology)  Extended Emergency Contact Information Primary Emergency Contact: Ronnie Jackson Mobile Phone: [phone] Relation: Grandson Secondary Emergency Contact: Ronnie Jackson Mobile Phone: [phone] Relation: Daughter  Code Status:  Full Code  Goals of care: Advanced Directive information    12/05/2021    3:08 PM  Advanced Directives  Does Patient Have a Medical Advance Directive? No  Does patient want to make changes to medical advance directive? No - Patient declined     Chief Complaint  Patient presents with   Transitions Of Care    TOC 11/29/2021-12/01/2021 due to Symptomatic Anemia.     HPI:  Pt is a 86 y.o. male seen today for an acute visit for Transition of care post hospitalization from 11/29/2021 - 12/01/2021 due to anemia. He was here on 11/20/2021 for his 8-monthfollow-up appointment lab work done indicated  hemoglobin 6.6 was advised to go to the emergency room for evaluation of low hemoglobin.  In the ED his hemoglobin was 6.6 was transfused 2 units of packed red blood cells.  Gastroenterology was consulted.  FOBT was positive.  He underwent EGD D found to have erythema in the gastric fundus.  He was treated with Protonix 40 mg daily to continue for 1 month colonoscopy was recommended but patient declined wanted to be discharged home.  Gastroenterology recommended to hold Eliquis until CBC  and iron panel is rechecked by PCP on outpatient if stable may to resume Eliquis.  He is hemoglobin post 2 units of packed red blood cells improved to 7.7.  Also had bilateral lower extremity edema possibly due to chronic systolic congestive heart failure on furosemide 40 mg p.o. twice daily.  Echocardiogram showed EF 40 to 45% left ventricle demonstrated global hypokinesis.  Hypokalemia was repleted   Past Medical History:  Diagnosis Date   Anxiety state, unspecified    [place]   Chronic pain syndrome    Constipation    Depressive disorder, not elsewhere classified    Dysphagia, unspecified(787.20)    Eye exam normal 09/01/2019   Diabetic eye exam did not reveal any retinopathy.    Herpes simplex disease    Hyperlipidemia    Hypertension    Hypopotassemia    Insomnia, unspecified    Intestinovesical fistula    Malignant neoplasm of prostate (HCC)    Myalgia and myositis, unspecified    Osteoarthritis    Pain in joint, lower leg    Bilateral knee pains   Reflux esophagitis    Rheumatoid arthritis with rheumatoid factor (HCC)    Spinal stenosis, unspecified region other than cervical    [place]ocytopenia, unspecified ([place]    Type II or unspecified type diabetes mellitus without mention of complication, not stated as uncontrolled    Pt states that it is prediabetic   Unspecified arthropathy, pelvic region and thigh    Unspecified hereditary and idiopathic peripheral neuropathy    Past Surgical History:  Procedure Laterality Date   BIOPSY  12/01/2021   Procedure: BIOPSY;  Surgeon: KRonnette Juniper MD;  Location:  [facility] ENDOSCOPY;  Service: Gastroenterology;;   CARDIAC CATHETERIZATION  01/31/2004   Dr Gwenlyn Found   CARDIOVERSION N/A 07/12/2016   Procedure: CARDIOVERSION;  Surgeon: Pixie Casino, MD;  Location: [facility] ENDOSCOPY;  Service: Cardiovascular;  Laterality: N/A;   COLONOSCOPY  08/09/2006   Dr Christian Mate, hemorrhoids/rectal fistula   cystocopy  07/2006   Dr  [name]   ESOPHAGOGASTRODUODENOSCOPY N/A 12/01/2021   Procedure: ESOPHAGOGASTRODUODENOSCOPY (EGD);  Surgeon: Ronnette Juniper, MD;  Location: [facility];  Service: Gastroenterology;  Laterality: N/A;   [place]   TOTAL HIP ARTHROPLASTY Right 02/20/2018   Procedure: RIGHT TOTAL HIP ARTHROPLASTY ANTERIOR APPROACH;  Surgeon: Mcarthur Rossetti, MD;  Location: [facility] ORS;  Service: Orthopedics;  Laterality: Right;    No Known Allergies  Outpatient Encounter Medications as of 12/05/2021  Medication Sig   cromolyn (OPTICROM) 4 % ophthalmic solution Place 1 drop into both eyes 4 (four) times daily.   furosemide (LASIX) 40 MG tablet Take Two tablet by mouth in the morning and one tablet by mouth in the afternoon daily.x 3 days then resume one by tablet by mouth twice daily.   pantoprazole (PROTONIX) 40 MG tablet Take 1 tablet (40 mg total) by mouth daily.   polyethylene glycol (MIRALAX / GLYCOLAX) 17 g packet Take 17 g by mouth daily.   potassium chloride SA (KLOR-CON) 20 MEQ tablet TAKE 2 TABLETS BY MOUTH DAILY WHEN TAKING LASIX (FUROSMIDE)   apixaban (ELIQUIS) 5 MG TABS tablet Take 1 tablet (5 mg total) by mouth 2 (two) times daily. Hold Eliquis for 5 days till 12/06/2021, check CBC and iron panel at PCP office.  If hemoglobin stable, can restart taking Eliquis.   No facility-administered encounter medications on file as of 12/05/2021.    Review of Systems  Constitutional:  Negative for appetite change, chills, fatigue, fever and unexpected weight change.  HENT:  Positive for hearing loss. Negative for congestion, dental problem, ear discharge, ear pain, facial swelling, nosebleeds, postnasal drip, rhinorrhea, sinus pressure, sinus pain, sneezing, sore throat, tinnitus and trouble swallowing.   Eyes:  Negative for pain, discharge, redness, itching and visual disturbance.  Respiratory:  Negative for cough, chest tightness, shortness of breath and wheezing.    Cardiovascular:  Negative for chest pain, palpitations and leg swelling.  Gastrointestinal:  Negative for abdominal distention, abdominal pain, blood in stool, constipation, diarrhea, nausea and vomiting.  Endocrine: Negative for cold intolerance, heat intolerance, polydipsia, polyphagia and polyuria.  Genitourinary:  Negative for difficulty urinating, dysuria, flank pain, frequency and urgency.  Musculoskeletal:  Negative for arthralgias, back pain, gait problem, joint swelling, myalgias, neck pain and neck stiffness.  Skin:  Negative for color change, pallor, rash and wound.  Neurological:  Negative for dizziness, syncope, speech difficulty, weakness, light-headedness, numbness and headaches.  Hematological:  Does not bruise/bleed easily.  Psychiatric/Behavioral:  Negative for agitation, behavioral problems, confusion, hallucinations, self-injury, sleep disturbance and suicidal ideas. The patient is not nervous/anxious.    Immunization History  Administered Date(s) Administered   Fluad Quad(high Dose 65+) 10/19/2019   Influenza, High Dose Seasonal PF 04/18/2017, 05/12/2018   Influenza,inj,Quad PF,6+ Mos 06/21/2015   Pneumococcal Conjugate-13 06/21/2015   Pneumococcal Polysaccharide-23 11/15/2016   Pertinent  Health Maintenance Due  Topic Date Due   OPHTHALMOLOGY EXAM  08/31/2020   FOOT EXAM  05/02/2021   URINE MICROALBUMIN  05/23/2022   HEMOGLOBIN A1C  06/01/2022   INFLUENZA VACCINE  Discontinued      11/30/2021  3:00 AM 11/30/2021    8:33 AM 11/30/2021    8:05 PM 12/01/2021    9:55 AM 12/05/2021    3:07 PM  Fall Risk  Falls in the past year?     0  Was there an injury with Fall?     0  Fall Risk Category Calculator     0  Fall Risk Category     Low  Patient Fall Risk Level High fall risk High fall risk High fall risk High fall risk Low fall risk  Patient at Risk for Falls Due to     No Fall Risks  Fall risk Follow up     Falls evaluation completed   Functional Status  Survey:    Vitals:   12/05/21 1459  BP: 120/80  Pulse: 90  Resp: 16  Temp: 97.7 F (36.5 C)  SpO2: 98%  Weight: 193 lb 1.6 oz (87.6 kg)  Height: '5\' 8"'$  (1.727 m)   Body mass index is 29.36 kg/m. Physical Exam Vitals reviewed.  Constitutional:      General: He is not in acute distress.    Appearance: Normal appearance. He is overweight. He is not ill-appearing or diaphoretic.  HENT:     Head: Normocephalic.     Right Ear: Tympanic membrane, ear canal and external ear normal. There is no impacted cerumen.     Left Ear: Tympanic membrane, ear canal and external ear normal. There is no impacted cerumen.     Nose: Nose normal. No congestion or rhinorrhea.     Mouth/Throat:     Mouth: Mucous membranes are moist.     Pharynx: Oropharynx is clear. No oropharyngeal exudate or posterior oropharyngeal erythema.  Eyes:     General: No scleral icterus.       Right eye: No discharge.        Left eye: No discharge.     Extraocular Movements: Extraocular movements intact.     Conjunctiva/sclera: Conjunctivae normal.     Pupils: Pupils are equal, round, and reactive to light.  Neck:     Vascular: No carotid bruit.  Cardiovascular:     Rate and Rhythm: Normal rate and regular rhythm.     Pulses: Normal pulses.     Heart sounds: Normal heart sounds. No murmur heard.   No friction rub. No gallop.  Pulmonary:     Effort: Pulmonary effort is normal. No respiratory distress.     Breath sounds: Normal breath sounds. No wheezing, rhonchi or rales.  Chest:     Chest wall: No tenderness.  Abdominal:     General: Bowel sounds are normal. There is no distension.     Palpations: Abdomen is soft. There is no mass.     Tenderness: There is no abdominal tenderness. There is no right CVA tenderness, left CVA tenderness, guarding or rebound.  Musculoskeletal:        General: No swelling or tenderness. Normal range of motion.     Cervical back: Normal range of motion. No rigidity or tenderness.      Right lower leg: No edema.     Left lower leg: No edema.  Lymphadenopathy:     Cervical: No cervical adenopathy.  Skin:    General: Skin is warm and dry.     Coloration: Skin is not pale.     Findings: No bruising, erythema, lesion or rash.  Neurological:     Mental Status: He is alert and oriented to person, place, and time.  Cranial Nerves: No cranial nerve deficit.     Sensory: No sensory deficit.     Motor: No weakness.     Coordination: Coordination normal.     Gait: Gait normal.  Psychiatric:        Mood and Affect: Mood normal.        Speech: Speech normal.        Behavior: Behavior normal.        Thought Content: Thought content normal.        Judgment: Judgment normal.    Labs reviewed: Recent Labs    11/28/21 0945 11/29/21 2036 11/30/21 1210  NA 142 143 141  K 4.5 3.2* 4.0  CL 107 110 110  CO2 26 24 21*  GLUCOSE 105* 97 112*  BUN 26* 26* 24*  CREATININE 1.77* 1.58* 1.63*  CALCIUM 8.6 8.7* 8.3*  MG  --  2.4  --   PHOS  --  3.3  --    Recent Labs    11/28/21 0945 11/29/21 2036 11/30/21 1210  AST '16 21 24  '$ ALT '9 13 12  '$ ALKPHOS  --  59 51  BILITOT 0.9 0.9 2.1*  PROT 6.8 7.6 6.9  ALBUMIN  --  4.2 3.9   Recent Labs    05/23/21 1435 11/28/21 0945 11/29/21 2036 11/30/21 0026 11/30/21 1210 12/01/21 0458  WBC 6.7 5.9 6.5 5.5 5.9 6.2  NEUTROABS 3,457 3,192 4.0  --   --   --   HGB 9.6* 6.6* 6.6* 6.3* 8.1* 7.7*  HCT 29.4* 22.0* 22.5* 21.6* 26.4* 24.5*  MCV 93.3 84.0 85.6 86.1 83.8 84.2  PLT 166 206 213 190 124* 157   Lab Results  Component Value Date   TSH 1.651 11/29/2021   Lab Results  Component Value Date   HGBA1C 6.2 (H) 11/29/2021   Lab Results  Component Value Date   CHOL 124 11/28/2021   HDL 47 11/28/2021   LDLCALC 65 11/28/2021   TRIG 50 11/28/2021   CHOLHDL 2.6 11/28/2021    Significant Diagnostic Results in last 30 days:  ECHOCARDIOGRAM COMPLETE  Result Date: 11/30/2021    ECHOCARDIOGRAM REPORT   Patient Name:   XABI WITTLER Date of Exam: 11/30/2021 Medical Rec #:  [id number]      Height:       68.0 in Accession #:    [id number]     Weight:       175.0 lb Date of Birth:  1935/08/31      BSA:          1.931 m Patient Age:    86 years       BP:           112/61 mmHg Patient Gender: M              HR:           74 bpm. Exam Location:  Inpatient Procedure: 2D Echo, Cardiac Doppler and Color Doppler Indications:    Dyspnea  History:        Patient has prior history of Echocardiogram examinations, most                 recent 04/19/2016. Risk Factors:Diabetes and HLD.  Sonographer:    Joette Catching RCS Referring Phys: [place]  1. Left ventricular ejection fraction, by estimation, is 40 to 45%. Left ventricular ejection fraction by 2D MOD biplane is 40.2 %. The left ventricle has mildly decreased function. The left ventricle demonstrates  global hypokinesis. There is mild left ventricular hypertrophy. Left ventricular diastolic function could not be evaluated. Elevated left ventricular end-diastolic pressure. The E/e' is 7.  2. Right ventricular systolic function is low normal. The right ventricular size is normal. There is moderately elevated pulmonary artery systolic pressure. The estimated right ventricular systolic pressure is 65.7 mmHg.  3. Left atrial size was moderately dilated.  4. Right atrial size was mildly dilated.  5. The mitral valve is abnormal. Trivial mitral valve regurgitation.  6. The aortic valve is tricuspid. Aortic valve regurgitation is not visualized. Aortic valve sclerosis/calcification is present, without any evidence of aortic stenosis.  7. Aortic dilatation noted. There is borderline dilatation of the aortic root, measuring 38 mm. There is mild dilatation of the ascending aorta, measuring 42 mm. There is mild (Grade II) calcified plaque plaque involving the aortic root.  8. The inferior vena cava is dilated in size with <50% respiratory variability, suggesting right atrial pressure of  15 mmHg. Comparison(s): Changes from prior study are noted. 04/19/2016: LVEF 65-70%, mild LVH, ascending aorta measured 41 mm. FINDINGS  Left Ventricle: Left ventricular ejection fraction, by estimation, is 40 to 45%. Left ventricular ejection fraction by 2D MOD biplane is 40.2 %. The left ventricle has mildly decreased function. The left ventricle demonstrates global hypokinesis. The left ventricular internal cavity size was normal in size. There is mild left ventricular hypertrophy. Left ventricular diastolic function could not be evaluated due to atrial fibrillation. Left ventricular diastolic function could not be evaluated. Elevated left ventricular end-diastolic pressure. The E/e' is 39. Right Ventricle: The right ventricular size is normal. No increase in right ventricular wall thickness. Right ventricular systolic function is low normal. There is moderately elevated pulmonary artery systolic pressure. The tricuspid regurgitant velocity  is 3.09 m/s, and with an assumed right atrial pressure of 15 mmHg, the estimated right ventricular systolic pressure is 84.6 mmHg. Left Atrium: Left atrial size was moderately dilated. Right Atrium: Right atrial size was mildly dilated. Pericardium: There is no evidence of pericardial effusion. Mitral Valve: The mitral valve is abnormal. There is mild calcification of the anterior and posterior mitral valve leaflet(s). Mild mitral annular calcification. Trivial mitral valve regurgitation. Tricuspid Valve: The tricuspid valve is grossly normal. Tricuspid valve regurgitation is mild. Aortic Valve: The aortic valve is tricuspid. Aortic valve regurgitation is not visualized. Aortic valve sclerosis/calcification is present, without any evidence of aortic stenosis. Aortic valve mean gradient measures 6.0 mmHg. Aortic valve peak gradient measures 11.4 mmHg. Aortic valve area, by VTI measures 1.17 cm. Pulmonic Valve: The pulmonic valve was normal in structure. Pulmonic valve  regurgitation is not visualized. Aorta: Aortic dilatation noted. There is borderline dilatation of the aortic root, measuring 38 mm. There is mild dilatation of the ascending aorta, measuring 42 mm. There is mild (Grade II) calcified plaque plaque involving the aortic root. Venous: The inferior vena cava is dilated in size with less than 50% respiratory variability, suggesting right atrial pressure of 15 mmHg. IAS/Shunts: No atrial level shunt detected by color flow Doppler.  LEFT VENTRICLE PLAX 2D                        Biplane EF (MOD) LVIDd:         4.80 cm         LV Biplane EF:   Left LVIDs:         3.90 cm  ventricular LV PW:         1.30 cm                          ejection LV IVS:        1.40 cm                          fraction by LVOT diam:     1.80 cm                          2D MOD LV SV:         42                               biplane is LV SV Index:   22                               40.2 %. LVOT Area:     2.54 cm                                Diastology                                LV e' medial:    5.45 cm/s LV Volumes (MOD)               LV E/e' medial:  19.3 LV vol d, MOD    132.7 ml      LV e' lateral:   8.27 cm/s A2C:                           LV E/e' lateral: 12.7 LV vol d, MOD    135.8 ml A4C: LV vol s, MOD    76.5 ml A2C: LV vol s, MOD    88.8 ml A4C: LV SV MOD A2C:   56.2 ml LV SV MOD A4C:   135.8 ml LV SV MOD BP:    46.8 ml RIGHT VENTRICLE             IVC RV Basal diam:  4.40 cm     IVC diam: 2.90 cm RV Mid diam:    3.00 cm RV S prime:     10.30 cm/s TAPSE (M-mode): 1.5 cm LEFT ATRIUM             Index        RIGHT ATRIUM           Index LA diam:        3.60 cm 1.86 cm/m   RA Area:     23.30 cm LA Vol (A2C):   87.6 ml 45.37 ml/m  RA Volume:   65.60 ml  33.97 ml/m LA Vol (A4C):   72.6 ml 37.60 ml/m LA Biplane Vol: 80.1 ml 41.48 ml/m  AORTIC VALVE                     PULMONIC VALVE AV Area (Vmax):    1.31 cm      PV Vmax:       1.13 m/s AV Area (Vmean):    1.20 cm  PV Peak grad:  5.1 mmHg AV Area (VTI):     1.17 cm AV Vmax:           169.00 cm/s AV Vmean:          120.000 cm/s AV VTI:            0.356 m AV Peak Grad:      11.4 mmHg AV Mean Grad:      6.0 mmHg LVOT Vmax:         87.20 cm/s LVOT Vmean:        56.400 cm/s LVOT VTI:          0.164 m LVOT/AV VTI ratio: 0.46  AORTA Ao Root diam: 3.80 cm Ao Asc diam:  4.20 cm MITRAL VALVE                TRICUSPID VALVE MV Area (PHT): 4.89 cm     TR Peak grad:   38.2 mmHg MV Decel Time: 155 msec     TR Vmax:        309.00 cm/s MV E velocity: 105.00 cm/s MV A Prime:    3.9 cm/s     SHUNTS                             Systemic VTI:  0.16 m                             Systemic Diam: 1.80 cm Lyman Bishop MD Electronically signed by Lyman Bishop MD Signature Date/Time: 11/30/2021/12:32:54 PM    Final     Assessment/Plan 1. DM type 2 with diabetic peripheral neuropathy (HCC) Lab Results  Component Value Date   HGBA1C 6.2 (H) 11/29/2021  -Diet controlled Continue to monitor  2. Persistent atrial fibrillation (HCC) Heart rate controlled -Eliquis currently on hold due to recent hospitalization for anemia hemoglobin 6.6 We will recheck levels this visit restart Eliquis if hemoglobin is stable per hospital discharge instructions. -Follow-up with a cardiologist - CBC with Differential/Platelet  3. Iron deficiency anemia, unspecified iron deficiency anemia type S/p hospital admission due to low hemoglobin 6.6 he was transfused with 2 units of packed red blood cells hemoglobin improved to 7.7 We will recheck levels - CBC with Differential/Platelet - Iron, TIBC and Ferritin Panel  4. Edema of both lower extremities Chronic No signs of fluid overload noted Continue on furosemide 40 mg tablet twice daily along with potassium supplement  5. Stage 3b chronic kidney disease (HCC) Creatinine at baseline Will continue to avoid Nephrotoxins and dose all other medication for renal clearance   6. Chronic  systolic congestive heart failure (HCC) No signs of fluid overload - Keep legs elevated when seated to keep swelling down  - check weight at least three times per week and notify provider for any abrupt weight gain > 3 lbs  - Reduce salt intake in diet   Family/ staff Communication: Reviewed plan of care with patient and wife verbalized understanding  Labs/tests ordered:  - CBC with Differential/Platelet - Iron, TIBC and Ferritin Panel  Next Appointment: As needed if symptoms worsen or fail to improve    Sandrea Hughs, NP

## 2021-12-06 LAB — CBC WITH DIFFERENTIAL/PLATELET
Absolute Monocytes: 496 cells/uL (ref 200–950)
Basophils Absolute: 40 cells/uL (ref 0–200)
Basophils Relative: 0.7 %
Eosinophils Absolute: 57 cells/uL (ref 15–500)
Eosinophils Relative: 1 %
HCT: 27.5 % — ABNORMAL LOW (ref 38.5–50.0)
Hemoglobin: 8.2 g/dL — ABNORMAL LOW (ref 13.2–17.1)
Lymphs Abs: 1625 cells/uL (ref 850–3900)
MCH: 24.9 pg — ABNORMAL LOW (ref 27.0–33.0)
MCHC: 29.8 g/dL — ABNORMAL LOW (ref 32.0–36.0)
MCV: 83.6 fL (ref 80.0–100.0)
MPV: 11.3 fL (ref 7.5–12.5)
Monocytes Relative: 8.7 %
Neutro Abs: 3483 cells/uL (ref 1500–7800)
Neutrophils Relative %: 61.1 %
Platelets: 162 10*3/uL (ref 140–400)
RBC: 3.29 10*6/uL — ABNORMAL LOW (ref 4.20–5.80)
RDW: 14.6 % (ref 11.0–15.0)
Total Lymphocyte: 28.5 %
WBC: 5.7 10*3/uL (ref 3.8–10.8)

## 2021-12-06 LAB — IRON,TIBC AND FERRITIN PANEL
%SAT: 6 % (calc) — ABNORMAL LOW (ref 20–48)
Ferritin: 12 ng/mL — ABNORMAL LOW (ref 24–380)
Iron: 27 ug/dL — ABNORMAL LOW (ref 50–180)
TIBC: 450 mcg/dL (calc) — ABNORMAL HIGH (ref 250–425)

## 2021-12-10 ENCOUNTER — Encounter: Payer: Self-pay | Admitting: Family

## 2021-12-10 DIAGNOSIS — I509 Heart failure, unspecified: Secondary | ICD-10-CM | POA: Insufficient documentation

## 2021-12-10 DIAGNOSIS — N189 Chronic kidney disease, unspecified: Secondary | ICD-10-CM | POA: Insufficient documentation

## 2021-12-12 ENCOUNTER — Telehealth: Payer: Self-pay | Admitting: Cardiology

## 2021-12-12 ENCOUNTER — Telehealth: Payer: Self-pay | Admitting: *Deleted

## 2021-12-12 NOTE — Telephone Encounter (Signed)
Please call and obtain a sooner appointment.Hemoglobin is still low.May restart eliQuis if unable to see cardiology sooner.then follow up in 2 week to recheck CBC.Notify provider for any dark or bloody stool.

## 2021-12-12 NOTE — Telephone Encounter (Signed)
Chong Sicilian, daughter called and stated that patient is out of hospital.   Stated that patient is concerned because [name] told him NOT to take the Eliquis until he follows up with his Cardiologist.   They called and got an appointment for 01/31/2022, the soonest they had.   Patient is worried about HOLDING the Eliquis this long. Wants to know if he can at least take One/day.   Please Advise.

## 2021-12-12 NOTE — Telephone Encounter (Signed)
Noted  

## 2021-12-12 NOTE — Telephone Encounter (Signed)
PCP is wanting pt to be seen sooner than July Pt is anemic and Eliquis has been stopped Pt's anemia has not been determined  as of yet and no testing has been scheduled Per pt has not seen any blood Will forward to Dr Curt Bears for review .Adonis Housekeeper

## 2021-12-12 NOTE — Telephone Encounter (Signed)
Spoke with Altha Harm, nurse for Dr. Curt Bears and she stated that she will send a message to Dr. Curt Bears to see if he will work patient in earlier. Stated that it will take a couple of days for a response.    I Called patient's daughter and scheduled an appointment in our office to follow up in 2 weeks. Appointment 12/26/2021 with [facility].

## 2021-12-12 NOTE — Telephone Encounter (Signed)
Lm to call back ./cy 

## 2021-12-12 NOTE — Telephone Encounter (Signed)
Rodena Piety calling to see if pt can get sooner appt  being that pt's Hemoglobin has dropped. Pt had to go to the hospital for blood transfusion and was told to stop Eliquis. Rodena Piety would like a callback if possible. Please advise

## 2021-12-12 NOTE — Telephone Encounter (Signed)
Called Dr. Macky Lower Office [phone] and spoke with Gloriann Loan. She did not have anything sooner but is sending Dr. Curt Bears and his nurse a message to call me back to see if patient can be worked in sooner than July.   Awaiting callback.

## 2021-12-14 NOTE — Telephone Encounter (Signed)
LVM for daughter Chong Sicilian to schedule f/u visit with EP APP

## 2021-12-26 ENCOUNTER — Encounter: Payer: Self-pay | Admitting: Family

## 2021-12-26 ENCOUNTER — Ambulatory Visit
Admission: RE | Admit: 2021-12-26 | Discharge: 2021-12-26 | Disposition: A | Discharge status: Home / Self Care | Payer: HMO | Source: Ambulatory Visit | Attending: Family | Admitting: Family

## 2021-12-26 ENCOUNTER — Other Ambulatory Visit (HOSPITAL_COMMUNITY): Payer: Self-pay

## 2021-12-26 ENCOUNTER — Ambulatory Visit (INDEPENDENT_AMBULATORY_CARE_PROVIDER_SITE_OTHER): Payer: HMO | Admitting: Family

## 2021-12-26 VITALS — BP 110/60 | HR 70 | Temp 98.4°F | Resp 18 | Ht 68.0 in | Wt 190.0 lb

## 2021-12-26 DIAGNOSIS — R600 Localized edema: Secondary | ICD-10-CM

## 2021-12-26 DIAGNOSIS — D509 Iron deficiency anemia, unspecified: Secondary | ICD-10-CM | POA: Diagnosis not present

## 2021-12-26 DIAGNOSIS — M25551 Pain in right hip: Secondary | ICD-10-CM

## 2021-12-26 DIAGNOSIS — I83813 Varicose veins of bilateral lower extremities with pain: Secondary | ICD-10-CM | POA: Diagnosis not present

## 2021-12-26 DIAGNOSIS — I4819 Other persistent atrial fibrillation: Secondary | ICD-10-CM

## 2021-12-26 DIAGNOSIS — M545 Low back pain, unspecified: Secondary | ICD-10-CM | POA: Diagnosis not present

## 2021-12-26 DIAGNOSIS — M5416 Radiculopathy, lumbar region: Secondary | ICD-10-CM

## 2021-12-26 MED ORDER — ACETAMINOPHEN 500 MG PO TABS
500.0000 mg | ORAL_TABLET | Freq: Three times a day (TID) | ORAL | 1 refills | Status: DC | PRN
  Filled 2021-12-26: qty 90, 30d supply, fill #0

## 2021-12-26 NOTE — Progress Notes (Signed)
Provider: Richarda Blade FNP-C  Ronnie Jackson, Ronnie Citrin, NP  Patient Care Team: Ronnie Jackson, Ronnie Citrin, NP as PCP - General (Family Medicine) Ronnie Lemming, MD as PCP - Cardiology (Cardiology) Ronnie Craze, NP as Nurse Practitioner (Cardiology) Ronnie Budge, MD as Consulting Physician (Pulmonary Disease) Ronnie Abu, MD as Consulting Physician (Neurosurgery) Ronnie Lerner, MD as Referring Physician (Surgery) Ronnie Jackson, Ronnie Jackson., MD (General Surgery) Ronnie Savoy, MD as Consulting Physician (Rheumatology) Ronnie Rutherford, MD as Consulting Physician (Ophthalmology)  Extended Emergency Contact Information Primary Emergency Contact: Ronnie Jackson Mobile Phone: [phone] Relation: Grandson Secondary Emergency Contact: Ronnie Jackson Mobile Phone: [phone] Relation: Daughter  Code Status:  Full Code  Goals of care: Advanced Directive information    12/26/2021   11:19 AM  Advanced Directives  Does Patient Have a Medical Advance Directive? No  Does patient want to make changes to medical advance directive? No - Patient declined     Chief Complaint  Patient presents with   Follow-up    Patient is here for a follow up to go over lab results     HPI:  Pt is a 86 y.o. male seen today for an acute visit for evaluation follow up anemia.  He was status post hospital admission on 11/29/2021 for acute anemia hemoglobin was 6.3 he was transfused 2 units of packed red blood cells and hemoglobin improved to 7.7.  FOBT was positive he underwent EGD and was Jackson to have erythema in the gastric fundus he was treated with Protonix 40 mg daily to continue for 1 month. colonoscopy was recommended but patient had declined wanted to go home.    He states he would like to see Gastroenterologist Dr. Algis Downs  to follow-up. He denies any rectal bleeding, blood or dark stool Outpatient follow-up hemoglobin was 8.2 on 12/05/2021.  He had to recheck hemoglobin today. Also  complaining of worsening leg swelling and pain.  Has declined to wear compression stockings.He denies any cough or shortness of breath. His weight remains stable compared to previous.  States lower back and right hip pain has gotten worse.  Request for something to help with the pain.  States was told in the past not to take ibuprofen or Tylenol.  Though his liver enzymes are normal discussed with him to take Tylenol as needed for pain.  Will obtain imaging   Past Medical History:  Diagnosis Date   Anxiety state, unspecified    Atrial fibrillation (HCC) 03/06/2016   CHF (congestive heart failure) (HCC)    Chronic kidney disease (CKD)    Chronic pain syndrome    Constipation    Depressive disorder, not elsewhere classified    Dysphagia, unspecified(787.20)    Eye exam normal 09/01/2019   Diabetic eye exam did not reveal any retinopathy.    Herpes simplex disease    Hyperlipidemia    Hypertension    Hypopotassemia    Insomnia, unspecified    Intestinovesical fistula    Malignant neoplasm of prostate (HCC)    Myalgia and myositis, unspecified    Osteoarthritis    Pain in joint, lower leg    Bilateral knee pains   Reflux esophagitis    Rheumatoid arthritis with rheumatoid factor (HCC)    Spinal stenosis, unspecified region other than cervical    Thrombocytopenia, unspecified (HCC)    Type II or unspecified type diabetes mellitus without mention of complication, not stated as uncontrolled    Pt states that it is prediabetic   Unspecified arthropathy, pelvic region and thigh  Unspecified hereditary and idiopathic peripheral neuropathy    Past Surgical History:  Procedure Laterality Date   BIOPSY  12/01/2021   Procedure: BIOPSY;  Surgeon: Ronnie Juniper, MD;  Location: [facility];  Service: Gastroenterology;;   CARDIAC CATHETERIZATION  01/31/2004   Dr Ronnie Jackson   CARDIOVERSION N/A 07/12/2016   Procedure: CARDIOVERSION;  Surgeon: Ronnie Casino, MD;  Location: [facility] ENDOSCOPY;  Service:  Cardiovascular;  Laterality: N/A;   COLONOSCOPY  08/09/2006   Dr Ronnie Jackson, hemorrhoids/rectal fistula   cystocopy  07/2006   Dr Ronnie Jackson   ESOPHAGOGASTRODUODENOSCOPY N/A 12/01/2021   Procedure: ESOPHAGOGASTRODUODENOSCOPY (EGD);  Surgeon: Ronnie Juniper, MD;  Location: [facility];  Service: Gastroenterology;  Laterality: N/A;   [place]   TOTAL HIP ARTHROPLASTY Right 02/20/2018   Procedure: RIGHT TOTAL HIP ARTHROPLASTY ANTERIOR APPROACH;  Surgeon: Ronnie Rossetti, MD;  Location: [facility];  Service: Orthopedics;  Laterality: Right;    No Known Allergies  Outpatient Encounter Medications as of 12/26/2021  Medication Sig   acetaminophen (TYLENOL) 500 MG tablet Take 1 tablet (500 mg total) by mouth every 8 (eight) hours as needed for moderate pain.   apixaban (ELIQUIS) 5 MG TABS tablet Take 1 tablet (5 mg total) by mouth 2 (two) times daily. Hold Eliquis for 5 days till 12/06/2021, check CBC and iron panel at PCP office.  If hemoglobin stable, can restart taking Eliquis.   cromolyn (OPTICROM) 4 % ophthalmic solution Place 1 drop into both eyes 4 (four) times daily.   furosemide (LASIX) 40 MG tablet Take Two tablet by mouth in the morning and one tablet by mouth in the afternoon daily.x 3 days then resume one by tablet by mouth twice daily.   pantoprazole (PROTONIX) 40 MG tablet Take 1 tablet (40 mg total) by mouth daily.   polyethylene glycol (MIRALAX / GLYCOLAX) 17 g packet Take 17 g by mouth daily.   potassium chloride SA (KLOR-CON) 20 MEQ tablet TAKE 2 TABLETS BY MOUTH DAILY WHEN TAKING LASIX (FUROSMIDE)   No facility-administered encounter medications on file as of 12/26/2021.    Review of Systems  Constitutional:  Negative for appetite change, chills, fatigue, fever and unexpected weight change.  Eyes:  Negative for pain, discharge, redness, itching and visual disturbance.  Respiratory:  Negative for cough, chest tightness, shortness of breath  and wheezing.   Cardiovascular:  Positive for leg swelling. Negative for chest pain and palpitations.       Varicose vein   Gastrointestinal:  Negative for abdominal distention, abdominal pain, blood in stool, constipation, diarrhea, nausea and vomiting.  Musculoskeletal:  Positive for arthralgias, back pain and gait problem. Negative for joint swelling, myalgias, neck pain and neck stiffness.  Skin:  Negative for color change, pallor, rash and wound.  Neurological:  Negative for dizziness, weakness, light-headedness and headaches.       Numbness and tingling on legs   Hematological:  Does not bruise/bleed easily.   Immunization History  Administered Date(s) Administered   Fluad Quad(high Dose 65+) 10/19/2019   Influenza, High Dose Seasonal PF 04/18/2017, 05/12/2018   Influenza,inj,Quad PF,6+ Mos 06/21/2015   Pneumococcal Conjugate-13 06/21/2015   Pneumococcal Polysaccharide-23 11/15/2016   Pertinent  Health Maintenance Due  Topic Date Due   OPHTHALMOLOGY EXAM  08/31/2020   FOOT EXAM  05/02/2021   URINE MICROALBUMIN  05/23/2022   HEMOGLOBIN A1C  06/01/2022   INFLUENZA VACCINE  Discontinued      11/30/2021    8:33 AM  11/30/2021    8:05 PM 12/01/2021    9:55 AM 12/05/2021    3:07 PM 12/26/2021    2:37 PM  Fall Risk  Falls in the past year?    0 0  Was there an injury with Fall?    0 0  Fall Risk Category Calculator    0 0  Fall Risk Category    Low Low  Patient Fall Risk Level High fall risk High fall risk High fall risk Low fall risk Low fall risk  Patient at Risk for Falls Due to    No Fall Risks No Fall Risks  Fall risk Follow up    Falls evaluation completed Falls evaluation completed   Functional Status Survey:    Vitals:   12/26/21 1436  BP: 110/60  Pulse: 70  Resp: 18  Temp: 98.4 F (36.9 C)  TempSrc: Temporal  SpO2: 99%  Weight: 190 lb (86.2 kg)  Height: $Remove'5\' 8"'sSnQWgO$  (1.727 m)   Body mass index is 28.89 kg/m. Physical Exam Vitals reviewed.  Constitutional:       General: He is not in acute distress.    Appearance: Normal appearance. He is normal weight. He is not ill-appearing or diaphoretic.  HENT:     Head: Normocephalic.  Eyes:     General: No scleral icterus.       Right eye: No discharge.        Left eye: No discharge.     Conjunctiva/sclera: Conjunctivae normal.     Pupils: Pupils are equal, round, and reactive to light.  Cardiovascular:     Rate and Rhythm: Normal rate and regular rhythm.     Pulses: Normal pulses.     Heart sounds: Normal heart sounds. No murmur heard.   No friction rub. No gallop.  Pulmonary:     Effort: Pulmonary effort is normal. No respiratory distress.     Breath sounds: Normal breath sounds. No wheezing, rhonchi or rales.  Chest:     Chest wall: No tenderness.  Abdominal:     General: Bowel sounds are normal. There is no distension.     Palpations: Abdomen is soft. There is no mass.     Tenderness: There is no abdominal tenderness. There is no right CVA tenderness, left CVA tenderness, guarding or rebound.  Musculoskeletal:        General: No swelling or tenderness. Normal range of motion.     Right lower leg: No edema.     Left lower leg: No edema.  Skin:    General: Skin is warm and dry.     Coloration: Skin is not pale.     Findings: No erythema or rash.  Neurological:     Mental Status: He is alert and oriented to person, place, and time.     Cranial Nerves: No cranial nerve deficit.     Sensory: No sensory deficit.     Motor: No weakness.     Coordination: Coordination normal.     Gait: Gait abnormal.  Psychiatric:        Mood and Affect: Mood normal.        Speech: Speech normal.        Behavior: Behavior normal.    Labs reviewed: Recent Labs    11/28/21 0945 11/29/21 2036 11/30/21 1210  NA 142 143 141  K 4.5 3.2* 4.0  CL 107 110 110  CO2 26 24 21*  GLUCOSE 105* 97 112*  BUN 26* 26* 24*  CREATININE 1.77*  1.58* 1.63*  CALCIUM 8.6 8.7* 8.3*  MG  --  2.4  --   PHOS  --  3.3  --      Recent Labs    11/28/21 0945 11/29/21 2036 11/30/21 1210  AST $Re'16 21 24  'blo$ ALT $R'9 13 12  'rt$ ALKPHOS  --  59 51  BILITOT 0.9 0.9 2.1*  PROT 6.8 7.6 6.9  ALBUMIN  --  4.2 3.9    Recent Labs    11/28/21 0945 11/29/21 2036 11/30/21 0026 11/30/21 1210 12/01/21 0458 12/05/21 1545  WBC 5.9 6.5   < > 5.9 6.2 5.7  NEUTROABS 3,192 4.0  --   --   --  3,483  HGB 6.6* 6.6*   < > 8.1* 7.7* 8.2*  HCT 22.0* 22.5*   < > 26.4* 24.5* 27.5*  MCV 84.0 85.6   < > 83.8 84.2 83.6  PLT 206 213   < > 124* 157 162   < > = values in this interval not displayed.    Lab Results  Component Value Date   TSH 1.651 11/29/2021   Lab Results  Component Value Date   HGBA1C 6.2 (H) 11/29/2021   Lab Results  Component Value Date   CHOL 124 11/28/2021   HDL 47 11/28/2021   LDLCALC 65 11/28/2021   TRIG 50 11/28/2021   CHOLHDL 2.6 11/28/2021    Significant Diagnostic Results in last 30 days:  ECHOCARDIOGRAM COMPLETE  Result Date: 11/30/2021    ECHOCARDIOGRAM REPORT   Patient Name:   NISSAN FRAZZINI Date of Exam: 11/30/2021 Medical Rec #:  [id number]      Height:       68.0 in Accession #:    [id number]     Weight:       175.0 lb Date of Birth:  [birth year]      BSA:          1.931 m Patient Age:    63 years       BP:           112/61 mmHg Patient Gender: M              HR:           74 bpm. Exam Location:  Inpatient Procedure: 2D Echo, Cardiac Doppler and Color Doppler Indications:    Dyspnea  History:        Patient has prior history of Echocardiogram examinations, most                 recent 04/19/2016. Risk Factors:Diabetes and HLD.  Sonographer:    Joette Catching RCS Referring Phys: [place]  1. Left ventricular ejection fraction, by estimation, is 40 to 45%. Left ventricular ejection fraction by 2D MOD biplane is 40.2 %. The left ventricle has mildly decreased function. The left ventricle demonstrates global hypokinesis. There is mild left ventricular hypertrophy. Left ventricular  diastolic function could not be evaluated. Elevated left ventricular end-diastolic pressure. The E/e' is 39.  2. Right ventricular systolic function is low normal. The right ventricular size is normal. There is moderately elevated pulmonary artery systolic pressure. The estimated right ventricular systolic pressure is 44.6 mmHg.  3. Left atrial size was moderately dilated.  4. Right atrial size was mildly dilated.  5. The mitral valve is abnormal. Trivial mitral valve regurgitation.  6. The aortic valve is tricuspid. Aortic valve regurgitation is not visualized. Aortic valve sclerosis/calcification is present, without any evidence of aortic stenosis.  7.  Aortic dilatation noted. There is borderline dilatation of the aortic root, measuring 38 mm. There is mild dilatation of the ascending aorta, measuring 42 mm. There is mild (Grade II) calcified plaque plaque involving the aortic root.  8. The inferior vena cava is dilated in size with <50% respiratory variability, suggesting right atrial pressure of 15 mmHg. Comparison(s): Changes from prior study are noted. 04/19/2016: LVEF 65-70%, mild LVH, ascending aorta measured 41 mm. FINDINGS  Left Ventricle: Left ventricular ejection fraction, by estimation, is 40 to 45%. Left ventricular ejection fraction by 2D MOD biplane is 40.2 %. The left ventricle has mildly decreased function. The left ventricle demonstrates global hypokinesis. The left ventricular internal cavity size was normal in size. There is mild left ventricular hypertrophy. Left ventricular diastolic function could not be evaluated due to atrial fibrillation. Left ventricular diastolic function could not be evaluated. Elevated left ventricular end-diastolic pressure. The E/e' is 26. Right Ventricle: The right ventricular size is normal. No increase in right ventricular wall thickness. Right ventricular systolic function is low normal. There is moderately elevated pulmonary artery systolic pressure. The  tricuspid regurgitant velocity  is 3.09 m/s, and with an assumed right atrial pressure of 15 mmHg, the estimated right ventricular systolic pressure is 64.4 mmHg. Left Atrium: Left atrial size was moderately dilated. Right Atrium: Right atrial size was mildly dilated. Pericardium: There is no evidence of pericardial effusion. Mitral Valve: The mitral valve is abnormal. There is mild calcification of the anterior and posterior mitral valve leaflet(s). Mild mitral annular calcification. Trivial mitral valve regurgitation. Tricuspid Valve: The tricuspid valve is grossly normal. Tricuspid valve regurgitation is mild. Aortic Valve: The aortic valve is tricuspid. Aortic valve regurgitation is not visualized. Aortic valve sclerosis/calcification is present, without any evidence of aortic stenosis. Aortic valve mean gradient measures 6.0 mmHg. Aortic valve peak gradient measures 11.4 mmHg. Aortic valve area, by VTI measures 1.17 cm. Pulmonic Valve: The pulmonic valve was normal in structure. Pulmonic valve regurgitation is not visualized. Aorta: Aortic dilatation noted. There is borderline dilatation of the aortic root, measuring 38 mm. There is mild dilatation of the ascending aorta, measuring 42 mm. There is mild (Grade II) calcified plaque plaque involving the aortic root. Venous: The inferior vena cava is dilated in size with less than 50% respiratory variability, suggesting right atrial pressure of 15 mmHg. IAS/Shunts: No atrial level shunt detected by color flow Doppler.  LEFT VENTRICLE PLAX 2D                        Biplane EF (MOD) LVIDd:         4.80 cm         LV Biplane EF:   Left LVIDs:         3.90 cm                          ventricular LV PW:         1.30 cm                          ejection LV IVS:        1.40 cm                          fraction by LVOT diam:     1.80 cm  2D MOD LV SV:         42                               biplane is LV SV Index:   22                                40.2 %. LVOT Area:     2.54 cm                                Diastology                                LV e' medial:    5.45 cm/s LV Volumes (MOD)               LV E/e' medial:  19.3 LV vol d, MOD    132.7 ml      LV e' lateral:   8.27 cm/s A2C:                           LV E/e' lateral: 12.7 LV vol d, MOD    135.8 ml A4C: LV vol s, MOD    76.5 ml A2C: LV vol s, MOD    88.8 ml A4C: LV SV MOD A2C:   56.2 ml LV SV MOD A4C:   135.8 ml LV SV MOD BP:    46.8 ml RIGHT VENTRICLE             IVC RV Basal diam:  4.40 cm     IVC diam: 2.90 cm RV Mid diam:    3.00 cm RV S prime:     10.30 cm/s TAPSE (M-mode): 1.5 cm LEFT ATRIUM             Index        RIGHT ATRIUM           Index LA diam:        3.60 cm 1.86 cm/m   RA Area:     23.30 cm LA Vol (A2C):   87.6 ml 45.37 ml/m  RA Volume:   65.60 ml  33.97 ml/m LA Vol (A4C):   72.6 ml 37.60 ml/m LA Biplane Vol: 80.1 ml 41.48 ml/m  AORTIC VALVE                     PULMONIC VALVE AV Area (Vmax):    1.31 cm      PV Vmax:       1.13 m/s AV Area (Vmean):   1.20 cm      PV Peak grad:  5.1 mmHg AV Area (VTI):     1.17 cm AV Vmax:           169.00 cm/s AV Vmean:          120.000 cm/s AV VTI:            0.356 m AV Peak Grad:      11.4 mmHg AV Mean Grad:      6.0 mmHg LVOT Vmax:         87.20 cm/s LVOT Vmean:        56.400 cm/s LVOT VTI:  0.164 m LVOT/AV VTI ratio: 0.46  AORTA Ao Root diam: 3.80 cm Ao Asc diam:  4.20 cm MITRAL VALVE                TRICUSPID VALVE MV Area (PHT): 4.89 cm     TR Peak grad:   38.2 mmHg MV Decel Time: 155 msec     TR Vmax:        309.00 cm/s MV E velocity: 105.00 cm/s MV A Prime:    3.9 cm/s     SHUNTS                             Systemic VTI:  0.16 m                             Systemic Diam: 1.80 cm Lyman Bishop MD Electronically signed by Lyman Bishop MD Signature Date/Time: 11/30/2021/12:32:54 PM    Final     Assessment/Plan 1. Iron deficiency anemia, unspecified iron deficiency anemia type Hemoglobin 8.2 on 12/05/2021 posthospital  discharge previous hemoglobin 6.3 was transfused 2 packed red blood cells during hospitalization hemoglobin improved to 7.7 No reports of blood in the stool or dark stool.  We will recheck hemoglobin this visit -Follow-up with gastroenterology for evaluation of anemia requests Dr. Steward Ros who saw him during hospitalization. - CBC with Differential/Platelet - Ambulatory referral to Gastroenterology  2. Persistent atrial fibrillation (HCC) On Eliquis twice daily No bleeding reported -Has upcoming appointment in July with cardiologist. - CBC with Differential/Platelet - BMP with eGFR([facility])  3. Edema of both lower extremities Reports swelling and pain worsening on lower extremities.  Not wearing compression stockings per daughter patient does not like compression stockings.  Will refer to vein and vascular specialist for further evaluation. - Ambulatory referral to Vascular Surgery  4. Lumbar back pain with radiculopathy affecting right lower extremity Worsening low back pain has not taken any Tylenol -Advised to take over-the-counter Tylenol 500 mg every 8 hours as needed for pain Will obtain imaging then referred to orthopedic if indicated - DG Lumbar Spine Complete; Future  5. Right hip pain S/p right hip replacement in 2019.  Suspect possible radiculopathy from lumbar pain Will obtain imaging to rule out acute abnormalities Over-the-counter Tylenol as above - DG HIP UNILAT W OR W/O PELVIS 2-3 VIEWS RIGHT; Future  6. Varicose veins of leg with pain, bilateral Worsening bilateral leg pain - Ambulatory referral to Vascular Surgery  Family/ staff Communication: Reviewed plan of care with patient and daughter verbalized understanding  Labs/tests ordered:  - DG HIP UNILAT W OR W/O PELVIS 2-3 VIEWS RIGHT; Future - DG Lumbar Spine Complete; Future - CBC with Differential/Platelet - BMP with eGFR([facility])  Next Appointment: Return if symptoms worsen or fail to improve.   Ronnie Hughs, NP

## 2021-12-27 LAB — BASIC METABOLIC PANEL WITH GFR
BUN/Creatinine Ratio: 10 (calc) (ref 6–22)
BUN: 16 mg/dL (ref 7–25)
CO2: 28 mmol/L (ref 20–32)
Calcium: 8.9 mg/dL (ref 8.6–10.3)
Chloride: 106 mmol/L (ref 98–110)
Creat: 1.62 mg/dL — ABNORMAL HIGH (ref 0.70–1.22)
Glucose, Bld: 102 mg/dL — ABNORMAL HIGH (ref 65–99)
Potassium: 4.3 mmol/L (ref 3.5–5.3)
Sodium: 142 mmol/L (ref 135–146)
eGFR: 42 mL/min/{1.73_m2} — ABNORMAL LOW (ref >=60)

## 2021-12-27 LAB — CBC WITH DIFFERENTIAL/PLATELET
Absolute Monocytes: 423 cells/uL (ref 200–950)
Basophils Absolute: 52 cells/uL (ref 0–200)
Basophils Relative: 0.8 %
Eosinophils Absolute: 111 cells/uL (ref 15–500)
Eosinophils Relative: 1.7 %
HCT: 28.3 % — ABNORMAL LOW (ref 38.5–50.0)
Hemoglobin: 8.7 g/dL — ABNORMAL LOW (ref 13.2–17.1)
Lymphs Abs: 1911 cells/uL (ref 850–3900)
MCH: 25.5 pg — ABNORMAL LOW (ref 27.0–33.0)
MCHC: 30.7 g/dL — ABNORMAL LOW (ref 32.0–36.0)
MCV: 83 fL (ref 80.0–100.0)
MPV: 11.2 fL (ref 7.5–12.5)
Monocytes Relative: 6.5 %
Neutro Abs: 4004 cells/uL (ref 1500–7800)
Neutrophils Relative %: 61.6 %
Platelets: 196 10*3/uL (ref 140–400)
RBC: 3.41 10*6/uL — ABNORMAL LOW (ref 4.20–5.80)
RDW: 15.7 % — ABNORMAL HIGH (ref 11.0–15.0)
Total Lymphocyte: 29.4 %
WBC: 6.5 10*3/uL (ref 3.8–10.8)

## 2021-12-29 ENCOUNTER — Telehealth: Payer: Self-pay | Admitting: *Deleted

## 2021-12-29 NOTE — Telephone Encounter (Signed)
Gerlene Fee, NP  You 46 minutes ago (3:41 PM)    Tell the family that there are no indications of infection present. He will need to start iron daily can buy over the counter.       Chong Sicilian, daughter notified and agreed.

## 2021-12-29 NOTE — Telephone Encounter (Signed)
Ronnie Jackson, daughter called and stated that patient was seen by [name] on 12/26/2021 and had labwork done. Stated that Webb Silversmith was going to do the [facility] and then determine if she was going to give patient antibiotics.   Daughter is calling requesting the results.  Please Advise. (Forwarded to [place] due to [employer] out of office)

## 2022-01-02 ENCOUNTER — Ambulatory Visit (INDEPENDENT_AMBULATORY_CARE_PROVIDER_SITE_OTHER): Payer: HMO | Admitting: Family

## 2022-01-02 ENCOUNTER — Encounter: Payer: Self-pay | Admitting: Family

## 2022-01-02 ENCOUNTER — Other Ambulatory Visit (HOSPITAL_COMMUNITY): Payer: Self-pay

## 2022-01-02 ENCOUNTER — Other Ambulatory Visit: Payer: Self-pay | Admitting: Family

## 2022-01-02 ENCOUNTER — Telehealth: Payer: Self-pay | Admitting: *Deleted

## 2022-01-02 DIAGNOSIS — I5022 Chronic systolic (congestive) heart failure: Secondary | ICD-10-CM

## 2022-01-02 DIAGNOSIS — Z0000 Encounter for general adult medical examination without abnormal findings: Secondary | ICD-10-CM | POA: Diagnosis not present

## 2022-01-02 DIAGNOSIS — R600 Localized edema: Secondary | ICD-10-CM

## 2022-01-02 DIAGNOSIS — D509 Iron deficiency anemia, unspecified: Secondary | ICD-10-CM

## 2022-01-02 MED ORDER — PANTOPRAZOLE SODIUM 40 MG PO TBEC
40.0000 mg | DELAYED_RELEASE_TABLET | Freq: Every day | ORAL | 0 refills | Status: DC
  Filled 2022-01-02: qty 90, 90d supply, fill #0

## 2022-01-02 MED ORDER — POTASSIUM CHLORIDE CRYS ER 20 MEQ PO TBCR
EXTENDED_RELEASE_TABLET | ORAL | 3 refills | Status: DC
  Filled 2022-01-02: qty 60, 30d supply, fill #0

## 2022-01-02 NOTE — Progress Notes (Signed)
This service is provided via telemedicine  No vital signs collected/recorded due to the encounter was a telemedicine visit.   Location of patient (ex: home, work):  home  Patient consents to a telephone visit:  yes  Location of the provider (ex: office, home):  office  Name of any referring provider:  Marlowe Sax, NP  Names of all persons participating in the telemedicine service and their role in the encounter:  Pam, daughter, Ronnie Jackson (Patient), [place].D CMA and Marlowe Sax, NP  Time spent on call:  7:57

## 2022-01-02 NOTE — Telephone Encounter (Signed)
[name], Ronnie Jackson are scheduled for a virtual visit with your provider today.    Just as we do with appointments in the office, we must obtain your consent to participate.  Your consent will be active for this visit and any virtual visit you Ronnie Jackson have with one of our providers in the next 365 days.    If you have a MyChart account, I can also send a copy of this consent to you electronically.  All virtual visits are billed to your insurance company just like a traditional visit in the office.  As this is a virtual visit, video technology does not allow for your provider to perform a traditional examination.  This Ronnie Jackson limit your provider's ability to fully assess your condition.  If your provider identifies any concerns that need to be evaluated in person or the need to arrange testing such as labs, EKG, etc, we will make arrangements to do so.    Although advances in technology are sophisticated, we cannot ensure that it will always work on either your end or our end.  If the connection with a video visit is poor, we Ronnie Jackson have to switch to a telephone visit.  With either a video or telephone visit, we are not always able to ensure that we have a secure connection.   I need to obtain your verbal consent now.   Are you willing to proceed with your visit today?   Ronnie Jackson has provided verbal consent on 01/02/2022 for a virtual visit (video or telephone).   Ronnie Jackson, [place] 01/02/2022  12:28 PM

## 2022-01-02 NOTE — Progress Notes (Signed)
Subjective:   Ronnie Jackson is a 86 y.o. male who presents for Medicare Annual/Subsequent preventive examination.  Review of Systems     Cardiac Risk Factors include: advanced age (>25mn, >>39women);male gender;smoking/ tobacco exposure;hypertension     Objective:    There were no vitals filed for this visit. There is no height or weight on file to calculate BMI.     01/02/2022    1:26 PM 01/02/2022   12:37 PM 12/26/2021   11:19 AM 12/05/2021    3:08 PM 11/29/2021    8:19 PM 11/29/2021    7:01 PM 10/26/2021    3:40 PM  Advanced Directives  Does Patient Have a Medical Advance Directive? No No No No No No No  Does patient want to make changes to medical advance directive? No - Patient declined No - Patient declined No - Patient declined No - Patient declined   No - Patient declined  Would patient like information on creating a medical advance directive?  No - Patient declined   No - Patient declined No - Patient declined     Current Medications (verified) Outpatient Encounter Medications as of 01/02/2022  Medication Sig   apixaban (ELIQUIS) 5 MG TABS tablet Take 1 tablet (5 mg total) by mouth 2 (two) times daily. Hold Eliquis for 5 days till 12/06/2021, check CBC and iron panel at PCP office.  If hemoglobin stable, can restart taking Eliquis.   cromolyn (OPTICROM) 4 % ophthalmic solution Place 1 drop into both eyes 4 (four) times daily.   furosemide (LASIX) 40 MG tablet Take Two tablet by mouth in the morning and one tablet by mouth in the afternoon daily.x 3 days then resume one by tablet by mouth twice daily.   Multiple Vitamins-Minerals (PRESERVISION AREDS 2 PO) Take 1 tablet by mouth daily.   pantoprazole (PROTONIX) 40 MG tablet Take 40 mg by mouth daily.   polyethylene glycol (MIRALAX / GLYCOLAX) 17 g packet Take 17 g by mouth daily.   potassium chloride SA (KLOR-CON) 20 MEQ tablet TAKE 2 TABLETS BY MOUTH DAILY WHEN TAKING LASIX (FUROSMIDE)   acetaminophen (TYLENOL) 500 MG tablet  Take 1 tablet (500 mg total) by mouth every 8 (eight) hours as needed for moderate pain. (Patient not taking: Reported on 01/02/2022)   [DISCONTINUED] pantoprazole (PROTONIX) 40 MG tablet Take 1 tablet (40 mg total) by mouth daily.   No facility-administered encounter medications on file as of 01/02/2022.    Allergies (verified) Patient has no known allergies.   History: Past Medical History:  Diagnosis Date   Anxiety state, unspecified    Atrial fibrillation ([place] 03/06/2016   CHF (congestive heart failure) (HCC)    Chronic kidney disease (CKD)    Chronic pain syndrome    Constipation    Depressive disorder, not elsewhere classified    Dysphagia, unspecified(787.20)    Eye exam normal 09/01/2019   Diabetic eye exam did not reveal any retinopathy.    Herpes simplex disease    Hyperlipidemia    Hypertension    Hypopotassemia    Insomnia, unspecified    Intestinovesical fistula    Malignant neoplasm of prostate (HCC)    Myalgia and myositis, unspecified    Osteoarthritis    Pain in joint, lower leg    Bilateral knee pains   Reflux esophagitis    Rheumatoid arthritis with rheumatoid factor (HCC)    Spinal stenosis, unspecified region other than cervical    Thrombocytopenia, unspecified ([facility]    Type II  or unspecified type diabetes mellitus without mention of complication, not stated as uncontrolled    Pt states that it is prediabetic   Unspecified arthropathy, pelvic region and thigh    Unspecified hereditary and idiopathic peripheral neuropathy    Past Surgical History:  Procedure Laterality Date   BIOPSY  12/01/2021   Procedure: BIOPSY;  Surgeon: Ronnette Juniper, MD;  Location: [facility];  Service: Gastroenterology;;   CARDIAC CATHETERIZATION  01/31/2004   Dr Gwenlyn Found   CARDIOVERSION N/A 07/12/2016   Procedure: CARDIOVERSION;  Surgeon: Pixie Casino, MD;  Location: [facility] ENDOSCOPY;  Service: Cardiovascular;  Laterality: N/A;   COLONOSCOPY  08/09/2006   Dr Christian Mate,  hemorrhoids/rectal fistula   cystocopy  07/2006   Dr [name]   ESOPHAGOGASTRODUODENOSCOPY N/A 12/01/2021   Procedure: ESOPHAGOGASTRODUODENOSCOPY (EGD);  Surgeon: Ronnette Juniper, MD;  Location: [facility];  Service: Gastroenterology;  Laterality: N/A;   [place]   TOTAL HIP ARTHROPLASTY Right 02/20/2018   Procedure: RIGHT TOTAL HIP ARTHROPLASTY ANTERIOR APPROACH;  Surgeon: Mcarthur Rossetti, MD;  Location: [facility];  Service: Orthopedics;  Laterality: Right;   Family History  Problem Relation Age of Onset   Prostate cancer Father    Heart failure Mother    Lung cancer Daughter    Arthritis Sister    Social History   Socioeconomic History   Marital status: Widowed    Spouse name: Kasheem Toner   Number of children: 1   Years of education: 12   Highest education level: 12th grade  Occupational History   Occupation: Retired  Tobacco Use   Smoking status: Never   Smokeless tobacco: Current    Types: Chew   Tobacco comments:    Chews tobacco daily   Vaping Use   Vaping Use: Never used  Substance and Sexual Activity   Alcohol use: No   Drug use: No   Sexual activity: Not Currently  Other Topics Concern   Not on file  Social History Narrative   Not on file   Social Determinants of Health   Financial Resource Strain: Low Risk  (11/05/2019)   Overall Financial Resource Strain (CARDIA)    Difficulty of Paying Living Expenses: Not hard at all  Food Insecurity: No Food Insecurity (11/05/2019)   Hunger Vital Sign    Worried About Running Out of Food in the Last Year: Never true    [place] in the Last Year: Never true  Transportation Needs: No Transportation Needs (11/05/2019)   PRAPARE - Hydrologist (Medical): No    Lack of Transportation (Non-Medical): No  Physical Activity: Inactive (11/05/2019)   Exercise Vital Sign    Days of Exercise per Week: 0 days    Minutes of Exercise per Session: 0 min   Stress: Stress Concern Present (11/05/2019)   [place]    Feeling of Stress : To some extent  Social Connections: Moderately Integrated (11/05/2019)   Social Connection and Isolation Panel [NHANES]    Frequency of Communication with Friends and Family: More than three times a week    Frequency of Social Gatherings with Friends and Family: More than three times a week    Attends Religious Services: More than 4 times per year    Active Member of [employer] or Organizations: No    Attends Archivist Meetings: Never    Marital Status: Married    Tobacco  Counseling Ready to quit: Not Answered Counseling given: Not Answered Tobacco comments: Chews tobacco daily    Clinical Intake:  Pre-visit preparation completed: No  Pain : 0-10 Pain Type: Chronic pain Pain Location: Hip (lower back pain) Pain Orientation: Right Pain Descriptors / Indicators: Aching, Constant Pain Onset: Other (comment) (several years) Pain Frequency: Constant Pain Relieving Factors: none Effect of Pain on Daily Activities: walking  Pain Relieving Factors: none  BMI - recorded: 29.36 Nutritional Status: BMI 25 -29 Overweight Nutritional Risks: None Diabetes: No  How often do you need to have someone help you when you read instructions, pamphlets, or other written materials from your doctor or pharmacy?: 1 - Never What is the last grade level you completed in school?: 12 grade  Diabetic?No   Interpreter Needed?: No      Activities of Daily Living    01/02/2022    1:51 PM 11/29/2021    8:25 PM  In your present state of health, do you have any difficulty performing the following activities:  Hearing? 1   Vision? 0   Difficulty concentrating or making decisions? 1   Comment remembering   Walking or climbing stairs? 0   Dressing or bathing? 0   Doing errands, shopping? 0 0  Preparing Food and eating ? N   Using the Toilet? N    In the past six months, have you accidently leaked urine? N   Do you have problems with loss of bowel control? N   Managing your Medications? N   Managing your Finances? N   Housekeeping or managing your Housekeeping? N     Patient Care Team: Mairany Bruno, Nelda Bucks, NP as PCP - General (Family Medicine) Constance Haw, MD as PCP - Cardiology (Cardiology) Tomasita Morrow, NP as Nurse Practitioner (Cardiology) Deneise Lever, MD as Consulting Physician (Pulmonary Disease) Kristeen Miss, MD as Consulting Physician (Neurosurgery) Ronny Bacon, MD as Referring Physician (Surgery) [name], Fransico Him., MD (General Surgery) Bo Merino, MD as Consulting Physician (Rheumatology) Clent Jacks, MD as Consulting Physician (Ophthalmology)  Indicate any recent Medical Services you may have received from other than [facility] providers in the past year (date may be approximate).     Assessment:   This is a routine wellness examination for Sascha.  Hearing/Vision screen Hearing Screening - Comments:: No hearing concerns. Patient has hearing aid for right ear. Vision Screening - Comments:: Patient has no vision concerns. Patient last eye exam dated 2023  Dietary issues and exercise activities discussed: Current Exercise Habits: The patient does not participate in regular exercise at present, Exercise limited by: orthopedic condition(s) (right hip and lower back pain)   Goals Addressed             This Visit's Progress    Patient Stated       Pain controlled         Depression Screen    01/02/2022    1:24 PM 01/04/2021   10:34 AM 05/02/2020   11:19 AM 05/02/2020   10:59 AM 11/27/2019    1:59 PM 11/05/2019   10:39 AM 11/02/2019   10:08 AM  PHQ 2/9 Scores  PHQ - 2 Score 0 0 0 0 0 4 4  PHQ- 9 Score      6 9    Fall Risk    01/02/2022   12:37 PM 12/26/2021    2:37 PM 12/05/2021    3:07 PM 10/26/2021    3:40 PM 05/23/2021    1:29 PM  Fall  Risk   Falls in the past  year? 0 0 0 0 0  Number falls in past yr: 0 0 0 0 0  Injury with Fall? 0 0 0 0 0  Risk for fall due to :  No Fall Risks No Fall Risks No Fall Risks No Fall Risks  Follow up  Falls evaluation completed Falls evaluation completed Falls evaluation completed Falls evaluation completed    [place]:  Any stairs in or around the home? No  If so, are there any without handrails? No  Home free of loose throw rugs in walkways, pet beds, electrical cords, etc? Yes  Adequate lighting in your home to reduce risk of falls? Yes   ASSISTIVE DEVICES UTILIZED TO PREVENT FALLS:  Life alert? No  Use of a cane, walker or w/c? No  Grab bars in the bathroom? Yes  Shower chair or bench in shower? No  Elevated toilet seat or a handicapped toilet? No   TIMED UP AND GO:  Was the test performed? No .  Length of time to ambulate 10 feet: N/A sec.   Gait slow and steady without use of assistive device  Cognitive Function:    01/04/2021   11:10 AM 11/20/2017    1:14 PM 11/15/2016   10:29 AM  MMSE - Mini Mental State Exam  Not completed: Refused    Orientation to time  3 4  Orientation to Place  5 4  Registration  3 3  Attention/ Calculation  5 5  Recall  2 2  Language- name 2 objects  2 2  Language- repeat  1 1  Language- follow 3 step command  3 3  Language- read & follow direction  1 1  Write a sentence  1 1  Copy design  1 1  Total score  27 27        01/02/2022    1:25 PM 11/27/2019    2:00 PM 11/25/2018    1:10 PM  6CIT Screen  What Year? 0 points 0 points 0 points  What month? 0 points 0 points 0 points  What time? 0 points 0 points 0 points  Count back from 20 0 points 0 points 0 points  Months in reverse 4 points 0 points 4 points  Repeat phrase 0 points 10 points 4 points  Total Score 4 points 10 points 8 points    Immunizations Immunization History  Administered Date(s) Administered   Fluad Quad(high Dose 65+) 10/19/2019   Influenza, High  Dose Seasonal PF 04/18/2017, 05/12/2018   Influenza,inj,Quad PF,6+ Mos 06/21/2015   Pneumococcal Conjugate-13 06/21/2015   Pneumococcal Polysaccharide-23 11/15/2016    TDAP status: Due, Education has been provided regarding the importance of this vaccine. Advised may receive this vaccine at local pharmacy or Health Dept. Aware to provide a copy of the vaccination record if obtained from local pharmacy or Health Dept. Verbalized acceptance and understanding.  Flu Vaccine status: Declined, Education has been provided regarding the importance of this vaccine but patient still declined. Advised may receive this vaccine at local pharmacy or Health Dept. Aware to provide a copy of the vaccination record if obtained from local pharmacy or Health Dept. Verbalized acceptance and understanding.  Pneumococcal vaccine status: Up to date  Covid-19 vaccine status: Declined, Education has been provided regarding the importance of this vaccine but patient still declined. Advised may receive this vaccine at local pharmacy or Health Dept.or vaccine clinic. Aware to provide a copy  of the vaccination record if obtained from local pharmacy or Health Dept. Verbalized acceptance and understanding.  Qualifies for Shingles Vaccine? Yes   Zostavax completed No   Shingrix Completed?: No.    Education has been provided regarding the importance of this vaccine. Patient has been advised to call insurance company to determine out of pocket expense if they have not yet received this vaccine. Advised may also receive vaccine at local pharmacy or Health Dept. Verbalized acceptance and understanding.  Screening Tests Health Maintenance  Topic Date Due   OPHTHALMOLOGY EXAM  08/31/2020   FOOT EXAM  05/02/2021   TETANUS/TDAP  11/01/2024 (Originally 08/02/1954)   URINE MICROALBUMIN  05/23/2022   HEMOGLOBIN A1C  06/01/2022   Pneumonia Vaccine 56+ Years old  Completed   HPV VACCINES  Aged Out   INFLUENZA VACCINE  Discontinued    COVID-19 Vaccine  Discontinued   Zoster Vaccines- Shingrix  Discontinued    Health Maintenance  Health Maintenance Due  Topic Date Due   OPHTHALMOLOGY EXAM  08/31/2020   FOOT EXAM  05/02/2021    Colorectal cancer screening: No longer required.   Lung Cancer Screening: (Low Dose CT Chest recommended if Age 31-80 years, 30 pack-year currently smoking OR have quit w/in 15years.) does not qualify.   Lung Cancer Screening Referral: No   Additional Screening:  Hepatitis C Screening: does qualify; Completed No  Vision Screening: Recommended annual ophthalmology exams for early detection of glaucoma and other disorders of the eye. Is the patient up to date with their annual eye exam?  Yes  Who is the provider or what is the name of the office in which the patient attends annual eye exams? Dr.Groat  If pt is not established with a provider, would they like to be referred to a provider to establish care? No .   Dental Screening: Recommended annual dental exams for proper oral hygiene  Community Resource Referral / Chronic Care Management: CRR required this visit?  No   CCM required this visit?  No      Plan:     I have personally reviewed and noted the following in the patient's chart:   Medical and social history Use of alcohol, tobacco or illicit drugs  Current medications and supplements including opioid prescriptions. Patient is not currently taking opioid prescriptions. Functional ability and status Nutritional status Physical activity Advanced directives List of other physicians Hospitalizations, surgeries, and ER visits in previous 12 months Vitals Screenings to include cognitive, depression, and falls Referrals and appointments  In addition, I have reviewed and discussed with patient certain preventive protocols, quality metrics, and best practice recommendations. A written personalized care plan for preventive services as well as general preventive health  recommendations were provided to patient.     Sandrea Hughs, NP   01/02/2022   Nurse Notes: Declined vaccines

## 2022-01-02 NOTE — Patient Instructions (Signed)
Ronnie Jackson , Thank you for taking time to come for your Medicare Wellness Visit. I appreciate your ongoing commitment to your health goals. Please review the following plan we discussed and let me know if I can assist you in the future.   Screening recommendations/referrals: Colonoscopy N/A Recommended yearly ophthalmology/optometry visit for glaucoma screening and checkup Recommended yearly dental visit for hygiene and checkup  Vaccinations: Influenza vaccine declined  Pneumococcal vaccine declined  Tdap vaccine declined  Shingles vaccine declined     Advanced directives: No   Conditions/risks identified: Advance age men > 84 yrs,male gender ,,use tobacco ,hypertension   Next appointment: 1 year   Preventive Care 84 Years and Older, Male Preventive care refers to lifestyle choices and visits with your health care provider that can promote health and wellness. What does preventive care include? A yearly physical exam. This is also called an annual well check. Dental exams once or twice a year. Routine eye exams. Ask your health care provider how often you should have your eyes checked. Personal lifestyle choices, including: Daily care of your teeth and gums. Regular physical activity. Eating a healthy diet. Avoiding tobacco and drug use. Limiting alcohol use. Practicing safe sex. Taking low doses of aspirin every day. Taking vitamin and mineral supplements as recommended by your health care provider. What happens during an annual well check? The services and screenings done by your health care provider during your annual well check will depend on your age, overall health, lifestyle risk factors, and family history of disease. Counseling  Your health care provider may ask you questions about your: Alcohol use. Tobacco use. Drug use. Emotional well-being. Home and relationship well-being. Sexual activity. Eating habits. History of falls. Memory and ability to understand  (cognition). Work and work Statistician. Screening  You may have the following tests or measurements: Height, weight, and BMI. Blood pressure. Lipid and cholesterol levels. These may be checked every 5 years, or more frequently if you are over 58 years old. Skin check. Lung cancer screening. You may have this screening every year starting at age 59 if you have a 30-pack-year history of smoking and currently smoke or have quit within the past 15 years. Fecal occult blood test (FOBT) of the stool. You may have this test every year starting at age 43. Flexible sigmoidoscopy or colonoscopy. You may have a sigmoidoscopy every 5 years or a colonoscopy every 10 years starting at age 61. Prostate cancer screening. Recommendations will vary depending on your family history and other risks. Hepatitis C blood test. Hepatitis B blood test. Sexually transmitted disease (STD) testing. Diabetes screening. This is done by checking your blood sugar (glucose) after you have not eaten for a while (fasting). You may have this done every 1-3 years. Abdominal aortic aneurysm (AAA) screening. You may need this if you are a current or former smoker. Osteoporosis. You may be screened starting at age 45 if you are at high risk. Talk with your health care provider about your test results, treatment options, and if necessary, the need for more tests. Vaccines  Your health care provider may recommend certain vaccines, such as: Influenza vaccine. This is recommended every year. Tetanus, diphtheria, and acellular pertussis (Tdap, Td) vaccine. You may need a Td booster every 10 years. Zoster vaccine. You may need this after age 23. Pneumococcal 13-valent conjugate (PCV13) vaccine. One dose is recommended after age 62. Pneumococcal polysaccharide (PPSV23) vaccine. One dose is recommended after age 42. Talk to your health care provider about  which screenings and vaccines you need and how often you need them. This  information is not intended to replace advice given to you by your health care provider. Make sure you discuss any questions you have with your health care provider. Document Released: 08/05/2015 Document Revised: 03/28/2016 Document Reviewed: 05/10/2015 Elsevier Interactive Patient Education  2017 [employer] Prevention in the Home Falls can cause injuries. They can happen to people of all ages. There are many things you can do to make your home safe and to help prevent falls. What can I do on the outside of my home? Regularly fix the edges of walkways and driveways and fix any cracks. Remove anything that might make you trip as you walk through a door, such as a raised step or threshold. Trim any bushes or trees on the path to your home. Use bright outdoor lighting. Clear any walking paths of anything that might make someone trip, such as rocks or tools. Regularly check to see if handrails are loose or broken. Make sure that both sides of any steps have handrails. Any raised decks and porches should have guardrails on the edges. Have any leaves, snow, or ice cleared regularly. Use sand or salt on walking paths during winter. Clean up any spills in your garage right away. This includes oil or grease spills. What can I do in the bathroom? Use night lights. Install grab bars by the toilet and in the tub and shower. Do not use towel bars as grab bars. Use non-skid mats or decals in the tub or shower. If you need to sit down in the shower, use a plastic, non-slip stool. Keep the floor dry. Clean up any water that spills on the floor as soon as it happens. Remove soap buildup in the tub or shower regularly. Attach bath mats securely with double-sided non-slip rug tape. Do not have throw rugs and other things on the floor that can make you trip. What can I do in the bedroom? Use night lights. Make sure that you have a light by your bed that is easy to reach. Do not use any sheets or  blankets that are too big for your bed. They should not hang down onto the floor. Have a firm chair that has side arms. You can use this for support while you get dressed. Do not have throw rugs and other things on the floor that can make you trip. What can I do in the kitchen? Clean up any spills right away. Avoid walking on wet floors. Keep items that you use a lot in easy-to-reach places. If you need to reach something above you, use a strong step stool that has a grab bar. Keep electrical cords out of the way. Do not use floor polish or wax that makes floors slippery. If you must use wax, use non-skid floor wax. Do not have throw rugs and other things on the floor that can make you trip. What can I do with my stairs? Do not leave any items on the stairs. Make sure that there are handrails on both sides of the stairs and use them. Fix handrails that are broken or loose. Make sure that handrails are as long as the stairways. Check any carpeting to make sure that it is firmly attached to the stairs. Fix any carpet that is loose or worn. Avoid having throw rugs at the top or bottom of the stairs. If you do have throw rugs, attach them to the floor with  carpet tape. Make sure that you have a light switch at the top of the stairs and the bottom of the stairs. If you do not have them, ask someone to add them for you. What else can I do to help prevent falls? Wear shoes that: Do not have high heels. Have rubber bottoms. Are comfortable and fit you well. Are closed at the toe. Do not wear sandals. If you use a stepladder: Make sure that it is fully opened. Do not climb a closed stepladder. Make sure that both sides of the stepladder are locked into place. Ask someone to hold it for you, if possible. Clearly mark and make sure that you can see: Any grab bars or handrails. First and last steps. Where the edge of each step is. Use tools that help you move around (mobility aids) if they are  needed. These include: Canes. Walkers. Scooters. Crutches. Turn on the lights when you go into a dark area. Replace any light bulbs as soon as they burn out. Set up your furniture so you have a clear path. Avoid moving your furniture around. If any of your floors are uneven, fix them. If there are any pets around you, be aware of where they are. Review your medicines with your doctor. Some medicines can make you feel dizzy. This can increase your chance of falling. Ask your doctor what other things that you can do to help prevent falls. This information is not intended to replace advice given to you by your health care provider. Make sure you discuss any questions you have with your health care provider. Document Released: 05/05/2009 Document Revised: 12/15/2015 Document Reviewed: 08/13/2014 Elsevier Interactive Patient Education  2017 [employer].

## 2022-01-05 ENCOUNTER — Other Ambulatory Visit (HOSPITAL_COMMUNITY): Payer: Self-pay

## 2022-01-05 ENCOUNTER — Other Ambulatory Visit: Payer: Self-pay | Admitting: *Deleted

## 2022-01-05 DIAGNOSIS — R600 Localized edema: Secondary | ICD-10-CM

## 2022-01-05 DIAGNOSIS — I5022 Chronic systolic (congestive) heart failure: Secondary | ICD-10-CM

## 2022-01-05 MED ORDER — PANTOPRAZOLE SODIUM 40 MG PO TBEC: 40.0000 mg | DELAYED_RELEASE_TABLET | Freq: Every day | ORAL | 0 refills | Status: DC

## 2022-01-05 MED ORDER — POTASSIUM CHLORIDE CRYS ER 20 MEQ PO TBCR: EXTENDED_RELEASE_TABLET | ORAL | 3 refills | Status: DC

## 2022-01-05 NOTE — Telephone Encounter (Signed)
Patient daughter called requesting The Rx's [name] sent over on 6/13 to be sent to [facility]. Patient cannot drive by himself to the [facility] to pick up.

## 2022-01-22 ENCOUNTER — Encounter: Payer: Self-pay | Admitting: Family

## 2022-01-31 ENCOUNTER — Encounter: Payer: Self-pay | Admitting: Cardiology

## 2022-01-31 ENCOUNTER — Ambulatory Visit (INDEPENDENT_AMBULATORY_CARE_PROVIDER_SITE_OTHER): Payer: PPO | Admitting: Cardiology

## 2022-01-31 VITALS — BP 122/68 | HR 70 | Wt 193.6 lb

## 2022-01-31 DIAGNOSIS — I4821 Permanent atrial fibrillation: Secondary | ICD-10-CM | POA: Diagnosis not present

## 2022-01-31 DIAGNOSIS — D500 Iron deficiency anemia secondary to blood loss (chronic): Secondary | ICD-10-CM

## 2022-01-31 DIAGNOSIS — D6869 Other thrombophilia: Secondary | ICD-10-CM | POA: Diagnosis not present

## 2022-01-31 DIAGNOSIS — R0602 Shortness of breath: Secondary | ICD-10-CM

## 2022-01-31 NOTE — Progress Notes (Signed)
Electrophysiology Office Note   Date:  01/31/2022   ID:  ROMIR KLIMOWICZ, DOB [birth year], MRN [id number]  PCP:  Sandrea Hughs, NP  Primary Electrophysiologist:  Constance Haw, MD    No chief complaint on file.    History of Present Illness: Ronnie Jackson is a 86 y.o. male who presents today for electrophysiology evaluation.     He has a history significant for hypertension and atrial fibrillation.  He initially had a cardioversion in 2017 but went back into atrial fibrillation and did well without complaint.  May 2023, he had a hospitalization due to anemia.  He was transferred 2 units of blood cells.  He was occult blood positive and was found to have gastritis.  He was started on a PPI.  Today, denies symptoms of palpitations, chest pain, shortness of breath, orthopnea, PND, lower extremity edema, claudication, dizziness, presyncope, syncope, bleeding, or neurologic sequela. The patient is tolerating medications without difficulties.  Since his hospitalization, he initially did well, but has started to have more episodes of weakness and fatigue.  He does not note blood in his stools, but did not note this at his prior visit to the emergency room in hospital.  He was occult blood positive at that time.    Past Medical History:  Diagnosis Date   Anxiety state, unspecified    Atrial fibrillation ([place]) 03/06/2016   CHF (congestive heart failure) (HCC)    Chronic kidney disease (CKD)    Chronic pain syndrome    Constipation    Depressive disorder, not elsewhere classified    Dysphagia, unspecified(787.20)    Eye exam normal 09/01/2019   Diabetic eye exam did not reveal any retinopathy.    Herpes simplex disease    Hyperlipidemia    Hypertension    Hypopotassemia    Insomnia, unspecified    Intestinovesical fistula    Malignant neoplasm of prostate (HCC)    Myalgia and myositis, unspecified    Osteoarthritis    Pain in joint, lower leg    Bilateral knee pains    Reflux esophagitis    Rheumatoid arthritis with rheumatoid factor (HCC)    Spinal stenosis, unspecified region other than cervical    Thrombocytopenia, unspecified ([place])    Type II or unspecified type diabetes mellitus without mention of complication, not stated as uncontrolled    Pt states that it is prediabetic   Unspecified arthropathy, pelvic region and thigh    Unspecified hereditary and idiopathic peripheral neuropathy    Past Surgical History:  Procedure Laterality Date   BIOPSY  12/01/2021   Procedure: BIOPSY;  Surgeon: Ronnette Juniper, MD;  Location: [facility];  Service: Gastroenterology;;   CARDIAC CATHETERIZATION  01/31/2004   Dr Gwenlyn Found   CARDIOVERSION N/A 07/12/2016   Procedure: CARDIOVERSION;  Surgeon: Pixie Casino, MD;  Location: [facility] ENDOSCOPY;  Service: Cardiovascular;  Laterality: N/A;   COLONOSCOPY  08/09/2006   Dr Christian Mate, hemorrhoids/rectal fistula   cystocopy  07/2006   Dr [name]   ESOPHAGOGASTRODUODENOSCOPY N/A 12/01/2021   Procedure: ESOPHAGOGASTRODUODENOSCOPY (EGD);  Surgeon: Ronnette Juniper, MD;  Location: [facility];  Service: Gastroenterology;  Laterality: N/A;   [place]   TOTAL HIP ARTHROPLASTY Right 02/20/2018   Procedure: RIGHT TOTAL HIP ARTHROPLASTY ANTERIOR APPROACH;  Surgeon: Mcarthur Rossetti, MD;  Location: [facility];  Service: Orthopedics;  Laterality: Right;     Current Outpatient Medications  Medication Sig Dispense Refill   acetaminophen (TYLENOL)  500 MG tablet Take 1 tablet (500 mg total) by mouth every 8 (eight) hours as needed for moderate pain. 90 tablet 1   apixaban (ELIQUIS) 5 MG TABS tablet Take 1 tablet (5 mg total) by mouth 2 (two) times daily. Hold Eliquis for 5 days till 12/06/2021, check CBC and iron panel at PCP office.  If hemoglobin stable, can restart taking Eliquis.     cromolyn (OPTICROM) 4 % ophthalmic solution Place 1 drop into both eyes 4 (four) times daily. 10 mL 5   furosemide  (LASIX) 40 MG tablet Take Two tablet by mouth in the morning and one tablet by mouth in the afternoon daily.x 3 days then resume one by tablet by mouth twice daily. 60 tablet 5   Multiple Vitamins-Minerals (PRESERVISION AREDS 2 PO) Take 1 tablet by mouth daily.     pantoprazole (PROTONIX) 40 MG tablet Take 1 tablet (40 mg total) by mouth daily. 90 tablet 0   polyethylene glycol (MIRALAX / GLYCOLAX) 17 g packet Take 17 g by mouth daily. 14 each 0   potassium chloride SA (KLOR-CON M) 20 MEQ tablet TAKE 2 TABLETS BY MOUTH DAILY WHEN TAKING LASIX (FUROSMIDE) 60 tablet 3   No current facility-administered medications for this visit.    Allergies:   Patient has no known allergies.   Social History:  The patient  reports that he has never smoked. His smokeless tobacco use includes chew. He reports that he does not drink alcohol and does not use drugs.   Family History:  The patient's family history includes Arthritis in his sister; Heart failure in his mother; Lung cancer in his daughter; Prostate cancer in his father.   ROS:  Please see the history of present illness.   Otherwise, review of systems is positive for none.   All other systems are reviewed and negative.   PHYSICAL EXAM: VS:  BP 122/68   Pulse 70   Wt 193 lb 9.6 oz (87.8 kg)   SpO2 95%   BMI 29.44 kg/m  , BMI Body mass index is 29.44 kg/m. GEN: Well nourished, well developed, in no acute distress  HEENT: normal  Neck: no JVD, carotid bruits, or masses Cardiac: irregular; no murmurs, rubs, or gallops, 2+ edema  Respiratory:  clear to auscultation bilaterally, normal work of breathing GI: soft, nontender, nondistended, + BS MS: no deformity or atrophy  Skin: warm and dry Neuro:  Strength and sensation are intact Psych: euthymic mood, full affect  EKG:  EKG is ordered today. Personal review of the ekg ordered shows atrial fibrillation, rate 70  Recent Lab//s: 11/29/2021: Magnesium 2.4; TSH 1.651 11/30/2021: ALT 12 12/26/2021:  BUN 16; Creat 1.62; Hemoglobin 8.7; Platelets 196; Potassium 4.3; Sodium 142    Lipid Panel     Component Value Date/Time   CHOL 124 11/28/2021 0945   CHOL 161 07/01/2015 0944   TRIG 50 11/28/2021 0945   HDL 47 11/28/2021 0945   HDL 33 (L) 07/01/2015 0944   CHOLHDL 2.6 11/28/2021 0945   VLDL 26 02/06/2016 1227   LDLCALC 65 11/28/2021 0945     Wt Readings from Last 3 Encounters:  01/31/22 193 lb 9.6 oz (87.8 kg)  12/26/21 190 lb (86.2 kg)  12/05/21 193 lb 1.6 oz (87.6 kg)      Other studies Reviewed: Additional studies/ records that were reviewed today include: TTE 11/30/21  1. Left ventricular ejection fraction, by estimation, is 40 to 45%. Left  ventricular ejection fraction by 2D MOD biplane is  40.2 %. The left  ventricle has mildly decreased function. The left ventricle demonstrates  global hypokinesis. There is mild left  ventricular hypertrophy. Left ventricular diastolic function could not be  evaluated. Elevated left ventricular end-diastolic pressure. The E/e' is  58.   2. Right ventricular systolic function is low normal. The right  ventricular size is normal. There is moderately elevated pulmonary artery  systolic pressure. The estimated right ventricular systolic pressure is  22.9 mmHg.   3. Left atrial size was moderately dilated.   4. Right atrial size was mildly dilated.   5. The mitral valve is abnormal. Trivial mitral valve regurgitation.   6. The aortic valve is tricuspid. Aortic valve regurgitation is not  visualized. Aortic valve sclerosis/calcification is present, without any  evidence of aortic stenosis.   7. Aortic dilatation noted. There is borderline dilatation of the aortic  root, measuring 38 mm. There is mild dilatation of the ascending aorta,  measuring 42 mm. There is mild (Grade II) calcified plaque plaque  involving the aortic root.   8. The inferior vena cava is dilated in size with <50% respiratory  variability, suggesting right atrial  pressure of 15 mmHg.    ASSESSMENT AND PLAN:  1.  Permanent atrial fibrillation: Currently on Eliquis 5 mg twice daily.  CHA2DS2-VASc of 4.  Currently rate controlled.  2.  Hypertension: Currently well controlled  3.  Blood loss anemia: Likely due to blood loss.  He had an endoscopy that showed gastritis and has started Protonix.  He is again feeling weak and fatigued.  We Kiet Geer check a CBC today to see if he has a bleeding again.  We Kamani Magnussen also refer him back to gastroenterology.  4.  Lower extremity edema: Planning to check BNP and BMP today.  Depending on his kidney function, may increase his Lasix for short period of time.  Current medicines are reviewed at length with the patient today.   The patient does not have concerns regarding his medicines.  The following changes were made today: None  Labs/ tests ordered today include:  Orders Placed This Encounter  Procedures   Basic metabolic panel   Pro b natriuretic peptide (BNP)   CBC   Ambulatory referral to Gastroenterology   EKG 12-Lead     Disposition:   FU 6 months  Signed, Fred Franzen Meredith Leeds, MD  01/31/2022 2:23 PM     [place] [address] [place] [address] [phone] (office) [phone] (fax)

## 2022-01-31 NOTE — Patient Instructions (Addendum)
Medication Instructions:  Your physician recommends that you continue on your current medications as directed. Please refer to the Current Medication list given to you today.  *If you need a refill on your cardiac medications before your next appointment, please call your pharmacy*   Lab Work: Today: BMET, BNP and CBC  If you have labs (blood work) drawn today and your tests are completely normal, you will receive your results only by: [place] (if you have MyChart) OR A paper copy in the mail If you have any lab test that is abnormal or we need to change your treatment, we will call you to review the results.   Testing/Procedures: None ordered   Follow-Up: At [facility], you and your health needs are our priority.  As part of our continuing mission to provide you with exceptional heart care, we have created designated Provider Care Teams.  These Care Teams include your primary Cardiologist (physician) and Advanced Practice Providers (APPs -  Physician Assistants and Nurse Practitioners) who all work together to provide you with the care you need, when you need it.  Your next appointment:   6 month(s)  The format for your next appointment:   In Person  Provider:   You will see one of the following Advanced Practice Providers on your designated Care Team:   Tommye Standard, [place] Legrand Como "Jonni Sanger" [place], [place]    You have been referred to Dr. Therisa Doyne with GI   Thank you for choosing [facility]!!   Trinidad Curet, RN [phone]  Other Instructions    Important Information About Sugar

## 2022-02-01 LAB — CBC
Hematocrit: 24 % — ABNORMAL LOW (ref 37.5–51.0)
Hemoglobin: 7.5 g/dL — ABNORMAL LOW (ref 13.0–17.7)
MCH: 25.1 pg — ABNORMAL LOW (ref 26.6–33.0)
MCHC: 31.3 g/dL — ABNORMAL LOW (ref 31.5–35.7)
MCV: 80 fL (ref 79–97)
Platelets: 225 10*3/uL (ref 150–450)
RBC: 2.99 x10E6/uL — ABNORMAL LOW (ref 4.14–5.80)
RDW: 17 % — ABNORMAL HIGH (ref 11.6–15.4)
WBC: 6.9 10*3/uL (ref 3.4–10.8)

## 2022-02-01 LAB — BASIC METABOLIC PANEL
BUN/Creatinine Ratio: 11 (ref 10–24)
BUN: 17 mg/dL (ref 8–27)
CO2: 22 mmol/L (ref 20–29)
Calcium: 8.5 mg/dL — ABNORMAL LOW (ref 8.6–10.2)
Chloride: 104 mmol/L (ref 96–106)
Creatinine, Ser: 1.5 mg/dL — ABNORMAL HIGH (ref 0.76–1.27)
Glucose: 100 mg/dL — ABNORMAL HIGH (ref 70–99)
Potassium: 3.8 mmol/L (ref 3.5–5.2)
Sodium: 143 mmol/L (ref 134–144)
eGFR: 46 mL/min/{1.73_m2} — ABNORMAL LOW (ref >59)

## 2022-02-01 LAB — PRO B NATRIURETIC PEPTIDE: NT-Pro BNP: 2373 pg/mL — ABNORMAL HIGH (ref 0–486)

## 2022-02-02 ENCOUNTER — Telehealth: Payer: Self-pay | Admitting: *Deleted

## 2022-02-02 DIAGNOSIS — M7989 Other specified soft tissue disorders: Secondary | ICD-10-CM

## 2022-02-02 DIAGNOSIS — Z79899 Other long term (current) drug therapy: Secondary | ICD-10-CM

## 2022-02-02 NOTE — Telephone Encounter (Addendum)
Left message to call back  (Per Dr. Curt Bears -- take Lasix 80 mg BID for 5 days,  then return to normal dosing.  Take Potassium supplement 20 mEq daily with  the extra lasix over next 5 days.  Pt needs to establish w/ general cardiology)   NT-Pro BNP 0 - 486 pg/mL 2,373 High

## 2022-02-06 DIAGNOSIS — K2970 Gastritis, unspecified, without bleeding: Secondary | ICD-10-CM | POA: Diagnosis not present

## 2022-02-06 DIAGNOSIS — D509 Iron deficiency anemia, unspecified: Secondary | ICD-10-CM | POA: Diagnosis not present

## 2022-02-14 ENCOUNTER — Other Ambulatory Visit: Payer: Self-pay | Admitting: *Deleted

## 2022-02-14 DIAGNOSIS — R609 Edema, unspecified: Secondary | ICD-10-CM

## 2022-02-20 NOTE — Telephone Encounter (Signed)
Spoke to dtr, ok per pt.   Spent 12 min on phone with dtr & pt.  Reports that pt is currently taking Lasix once a day, he states he thinks he has been taking it this was since hospitalization in May.  Will re-review w/ MD prior to increasing back to BID. Aware I will call back once discussed w/ MD. They are agreeable to plan.

## 2022-02-23 NOTE — Telephone Encounter (Signed)
Advised dtr pt should increase Lasix back to BID. Confirmed pt is taking Potassium. Scheduled pt to come by office on 8/16, after [facility] appt, for follow up blood work. Dtr agreeable to plan.

## 2022-03-07 ENCOUNTER — Ambulatory Visit: Payer: HMO | Admitting: Physician Assistant

## 2022-03-07 ENCOUNTER — Other Ambulatory Visit: Payer: HMO

## 2022-03-07 ENCOUNTER — Ambulatory Visit (HOSPITAL_COMMUNITY)
Admission: RE | Admit: 2022-03-07 | Discharge: 2022-03-07 | Disposition: A | Discharge status: Home / Self Care | Payer: HMO | Source: Ambulatory Visit | Attending: Vascular Surgery | Admitting: Vascular Surgery

## 2022-03-07 VITALS — BP 125/81 | HR 102 | Temp 98.6°F | Resp 20 | Ht 68.0 in | Wt 186.1 lb

## 2022-03-07 DIAGNOSIS — I872 Venous insufficiency (chronic) (peripheral): Secondary | ICD-10-CM

## 2022-03-07 DIAGNOSIS — Z79899 Other long term (current) drug therapy: Secondary | ICD-10-CM

## 2022-03-07 DIAGNOSIS — D509 Iron deficiency anemia, unspecified: Secondary | ICD-10-CM | POA: Insufficient documentation

## 2022-03-07 DIAGNOSIS — M7989 Other specified soft tissue disorders: Secondary | ICD-10-CM

## 2022-03-07 DIAGNOSIS — K2990 Gastroduodenitis, unspecified, without bleeding: Secondary | ICD-10-CM | POA: Insufficient documentation

## 2022-03-07 DIAGNOSIS — R609 Edema, unspecified: Secondary | ICD-10-CM | POA: Diagnosis not present

## 2022-03-07 NOTE — Progress Notes (Signed)
Office Note     CC:  follow up Requesting Provider:  Sandrea Hughs, NP  HPI: Ronnie Jackson is a 86 y.o. ([birth year]) male who presents for evaluation of varicose veins and edema of bilateral lower extremities left worse than right.  He reports an episode of cellulitis requiring admission for IV antibiotics about 3 years ago.  He has pigment changes of his left leg and is bothered by edema especially at the end of the day.  He denies any history of DVT, venous ulcerations, trauma, or prior vascular interventions.  He also complains of numbness in both feet however attributes this to his lumbar spine stenosis.  He is accompanied by his grandson who states the patient does not wear compression or elevate his legs despite constant reminders.  He denies tobacco use.   Past Medical History:  Diagnosis Date   Anxiety state, unspecified    Atrial fibrillation ([place]) 03/06/2016   CHF (congestive heart failure) (HCC)    Chronic kidney disease (CKD)    Chronic pain syndrome    Constipation    Depressive disorder, not elsewhere classified    Dysphagia, unspecified(787.20)    Eye exam normal 09/01/2019   Diabetic eye exam did not reveal any retinopathy.    Herpes simplex disease    Hyperlipidemia    Hypertension    Hypopotassemia    Insomnia, unspecified    Intestinovesical fistula    Malignant neoplasm of prostate (HCC)    Myalgia and myositis, unspecified    Osteoarthritis    Pain in joint, lower leg    Bilateral knee pains   Reflux esophagitis    Rheumatoid arthritis with rheumatoid factor (HCC)    Spinal stenosis, unspecified region other than cervical    Thrombocytopenia, unspecified ([place])    Type II or unspecified type diabetes mellitus without mention of complication, not stated as uncontrolled    Pt states that it is prediabetic   Unspecified arthropathy, pelvic region and thigh    Unspecified hereditary and idiopathic peripheral neuropathy     Past Surgical History:   Procedure Laterality Date   BIOPSY  12/01/2021   Procedure: BIOPSY;  Surgeon: Ronnette Juniper, MD;  Location: [facility];  Service: Gastroenterology;;   CARDIAC CATHETERIZATION  01/31/2004   Dr Gwenlyn Found   CARDIOVERSION N/A 07/12/2016   Procedure: CARDIOVERSION;  Surgeon: Pixie Casino, MD;  Location: [facility] ENDOSCOPY;  Service: Cardiovascular;  Laterality: N/A;   COLONOSCOPY  08/09/2006   Dr Christian Mate, hemorrhoids/rectal fistula   cystocopy  07/2006   Dr [name]   ESOPHAGOGASTRODUODENOSCOPY N/A 12/01/2021   Procedure: ESOPHAGOGASTRODUODENOSCOPY (EGD);  Surgeon: Ronnette Juniper, MD;  Location: [facility];  Service: Gastroenterology;  Laterality: N/A;   [place]   TOTAL HIP ARTHROPLASTY Right 02/20/2018   Procedure: RIGHT TOTAL HIP ARTHROPLASTY ANTERIOR APPROACH;  Surgeon: Mcarthur Rossetti, MD;  Location: [facility];  Service: Orthopedics;  Laterality: Right;    Social History   Socioeconomic History   Marital status: Widowed    Spouse name: Cris Talavera   Number of children: 1   Years of education: 12   Highest education level: 12th grade  Occupational History   Occupation: Retired  Tobacco Use   Smoking status: Never    Passive exposure: Never   Smokeless tobacco: Current    Types: Chew   Tobacco comments:    Chews tobacco daily   Vaping Use   Vaping Use: Never used  Substance and  Sexual Activity   Alcohol use: No   Drug use: No   Sexual activity: Not Currently  Other Topics Concern   Not on file  Social History Narrative   Not on file   Social Determinants of Health   Financial Resource Strain: Low Risk  (11/05/2019)   Overall Financial Resource Strain (CARDIA)    Difficulty of Paying Living Expenses: Not hard at all  Food Insecurity: No Food Insecurity (11/05/2019)   Hunger Vital Sign    Worried About Running Out of Food in the Last Year: Never true    Ran Out of Food in the Last Year: Never true  Transportation Needs: No  Transportation Needs (11/05/2019)   PRAPARE - Hydrologist (Medical): No    Lack of Transportation (Non-Medical): No  Physical Activity: Inactive (11/05/2019)   Exercise Vital Sign    Days of Exercise per Week: 0 days    Minutes of Exercise per Session: 0 min  Stress: Stress Concern Present (11/05/2019)   [place]    Feeling of Stress : To some extent  Social Connections: Moderately Integrated (11/05/2019)   Social Connection and Isolation Panel [NHANES]    Frequency of Communication with Friends and Family: More than three times a week    Frequency of Social Gatherings with Friends and Family: More than three times a week    Attends Religious Services: More than 4 times per year    Active Member of [employer] or Organizations: No    Attends Archivist Meetings: Never    Marital Status: Married  Human resources officer Violence: Not At Risk (11/05/2019)   Humiliation, Afraid, Rape, and Kick questionnaire    Fear of Current or Ex-Partner: No    Emotionally Abused: No    Physically Abused: No    Sexually Abused: No    Family History  Problem Relation Age of Onset   Prostate cancer Father    Heart failure Mother    Lung cancer Daughter    Arthritis Sister     Current Outpatient Medications  Medication Sig Dispense Refill   acetaminophen (TYLENOL) 500 MG tablet Take 1 tablet (500 mg total) by mouth every 8 (eight) hours as needed for moderate pain. 90 tablet 1   apixaban (ELIQUIS) 5 MG TABS tablet Take 1 tablet (5 mg total) by mouth 2 (two) times daily. Hold Eliquis for 5 days till 12/06/2021, check CBC and iron panel at PCP office.  If hemoglobin stable, can restart taking Eliquis.     cromolyn (OPTICROM) 4 % ophthalmic solution Place 1 drop into both eyes 4 (four) times daily. 10 mL 5   furosemide (LASIX) 40 MG tablet Take Two tablet by mouth in the morning and one tablet by mouth in the  afternoon daily.x 3 days then resume one by tablet by mouth twice daily. 60 tablet 5   Multiple Vitamins-Minerals (PRESERVISION AREDS 2 PO) Take 1 tablet by mouth daily.     pantoprazole (PROTONIX) 40 MG tablet Take 1 tablet (40 mg total) by mouth daily. 90 tablet 0   polyethylene glycol (MIRALAX / GLYCOLAX) 17 g packet Take 17 g by mouth daily. 14 each 0   potassium chloride SA (KLOR-CON M) 20 MEQ tablet TAKE 2 TABLETS BY MOUTH DAILY WHEN TAKING LASIX (FUROSMIDE) 60 tablet 3   No current facility-administered medications for this visit.    No Known Allergies   REVIEW OF SYSTEMS:   '[X]'$   denotes positive finding, '[ ]'$  denotes negative finding Cardiac  Comments:  Chest pain or chest pressure:    Shortness of breath upon exertion:    Short of breath when lying flat:    Irregular heart rhythm:        Vascular    Pain in calf, thigh, or hip brought on by ambulation:    Pain in feet at night that wakes you up from your sleep:     Blood clot in your veins:    Leg swelling:         Pulmonary    Oxygen at home:    Productive cough:     Wheezing:         Neurologic    Sudden weakness in arms or legs:     Sudden numbness in arms or legs:     Sudden onset of difficulty speaking or slurred speech:    Temporary loss of vision in one eye:     Problems with dizziness:         Gastrointestinal    Blood in stool:     Vomited blood:         Genitourinary    Burning when urinating:     Blood in urine:        Psychiatric    Major depression:         Hematologic    Bleeding problems:    Problems with blood clotting too easily:        Skin    Rashes or ulcers:        Constitutional    Fever or chills:      PHYSICAL EXAMINATION:  Vitals:   03/07/22 1259  BP: 125/81  Pulse: (!) 102  Resp: 20  Temp: 98.6 F (37 C)  TempSrc: Temporal  SpO2: 95%  Weight: 186 lb 1.6 oz (84.4 kg)  Height: '5\' 8"'$  (1.727 m)    General:  WDWN in NAD; vital signs documented above Gait: Not  observed HENT: WNL, normocephalic Pulmonary: normal non-labored breathing , without Rales, rhonchi,  wheezing Cardiac: regular HR Abdomen: soft, NT, no masses Skin: without rashes Vascular Exam/Pulses:  Right Left  DP 2+ (normal) 2+ (normal)   Extremities: Stasis pigmentation changes of the left shin; ropey varicosities involving left and right medial lower legs Musculoskeletal: no muscle wasting or atrophy  Neurologic: A&O X 3;  No focal weakness or paresthesias are detected Psychiatric:  The pt has Normal affect.   Non-Invasive Vascular Imaging:   Left lower extremity venous reflux study negative for DVT Incompetent left popliteal vein Incompetent left greater saphenous vein from the saphenofemoral junction down to the distal thigh with diameter greater than 4 mm throughout   ASSESSMENT/PLAN:: 86 y.o. male here for evaluation of varicose veins and lower extremity edema left more than right  -Left lower extremity venous reflux study demonstrates incompetent left popliteal vein as well as an incompetent left greater saphenous vein.  Patient does have ropey varicosities in the left medial lower leg however are asymptomatic.  Patient is accompanied by his grandson who tells me he has not been wearing compression or elevating his legs at all.  Recommendations included proper leg elevation which was demonstrated.  He was also measured for and purchased knee-high 15 to 20 mmHg compression stockings.  He is not interested in any surgeries or procedures thus he was not offered follow-up with a surgeon for potential laser ablation.  He will try conservative measures for the time being  and will call/return to office with any questions or concerns.  For now patient will follow-up on an as-needed basis.   Dagoberto Ligas, PA-C [facility] [phone]  Clinic MD:   Donzetta Matters

## 2022-03-08 LAB — BASIC METABOLIC PANEL
BUN/Creatinine Ratio: 8 — ABNORMAL LOW (ref 10–24)
BUN: 18 mg/dL (ref 8–27)
CO2: 25 mmol/L (ref 20–29)
Calcium: 8.8 mg/dL (ref 8.6–10.2)
Chloride: 99 mmol/L (ref 96–106)
Creatinine, Ser: 2.15 mg/dL — ABNORMAL HIGH (ref 0.76–1.27)
Glucose: 106 mg/dL — ABNORMAL HIGH (ref 70–99)
Potassium: 3.2 mmol/L — ABNORMAL LOW (ref 3.5–5.2)
Sodium: 143 mmol/L (ref 134–144)
eGFR: 30 mL/min/{1.73_m2} — ABNORMAL LOW (ref >59)

## 2022-03-21 ENCOUNTER — Telehealth: Payer: Self-pay | Admitting: Cardiology

## 2022-03-21 ENCOUNTER — Telehealth: Payer: Self-pay

## 2022-03-21 NOTE — Telephone Encounter (Signed)
Pt is 86 M, 84.4 kg and SCr 2.15.  Based on this information he should be on the 2.5 mg bid dose.    Please let him know to decrease dose to 2.5 mg bid and sample accordingly

## 2022-03-21 NOTE — Telephone Encounter (Signed)
Left message regarding a medication dose change and for patient to return call.

## 2022-03-21 NOTE — Telephone Encounter (Signed)
Incoming call received from patients daughter requesting samples of Eliquis.  Unfortunately we did not have any samples and I recommended contacting patients cardiologist. I offered to send a rx to the pharmacy and [name] stated, she will have the cardiologist forwarded a rx if they do not have any samples.   No further action required at this time

## 2022-03-21 NOTE — Telephone Encounter (Signed)
Pt c/o medication issue:  1. Name of Medication: apixaban (ELIQUIS) 5 MG TABS tablet  2. How are you currently taking this medication (dosage and times per day)? Take 1 tablet (5 mg total) by mouth 2 (two) times daily. Hold Eliquis for 5 days till 12/06/2021, check CBC and iron panel at PCP office. If hemoglobin stable, can restart taking Eliquis  3. Are you having a reaction (difficulty breathing--STAT)? No  4. What is your medication issue? Calling to see if our office have any samples

## 2022-03-21 NOTE — Telephone Encounter (Signed)
LMTCB

## 2022-03-22 MED ORDER — APIXABAN 2.5 MG PO TABS: 2.5000 mg | ORAL_TABLET | Freq: Two times a day (BID) | ORAL | 6 refills | Status: DC

## 2022-03-22 NOTE — Telephone Encounter (Signed)
Pt takes 5 mg of Eliquis, right? Please address

## 2022-03-22 NOTE — Telephone Encounter (Signed)
Hey Lynn, LPN, can you please advise on this matter? Thanks  ?

## 2022-03-22 NOTE — Telephone Encounter (Signed)
Pt daughter calling back about samples because pt is out

## 2022-03-22 NOTE — Telephone Encounter (Signed)
**Note De-Identified  Obfuscation** [name], RPH decreased the pts Eliquis  dose to 2.5 mg in this phone message. Thanks.

## 2022-03-22 NOTE — Addendum Note (Signed)
**Note De-Identified Kyshon Tolliver Obfuscation** Addended by: Dennie Fetters on: 03/22/2022 10:32 AM   Modules accepted: Orders

## 2022-03-22 NOTE — Telephone Encounter (Signed)
**Note De-Identified  Obfuscation** The pt came to the phone and gave me verbal permission for me to s/w Chong Sicilian, his daughter.  Per [name] the pt is completely out of Eliquis.  [name] I discussed pt asst through [employer] and she is interested in helping her Dad apply. I gave her [employer] phone number and advised her to call them to ask questions about their Eliquis program and the pts eligibility to be approved for their Eliquis program.  Chong Sicilian is aware that we are leaving the pt 2 weeks of Eliquis 2.5 mg samples and a [employer] application in the front office for them to pick up.  She is advised that the application is to be completed, required documents per [employer] obtained, and to return all to Dr [employer] office at [facility] at [facility] [address] ASAP if advised by [facility] that he is eligible for approval.  I have changed the pts Eliquis dose to 2.5 BID in his med list per Rockne Menghini, RPH and [name] is aware that this is a decreased dose.  [name] thanked me for our assistance and is aware to call back if they have any questions or concerns.

## 2022-03-29 DIAGNOSIS — H353131 Nonexudative age-related macular degeneration, bilateral, early dry stage: Secondary | ICD-10-CM | POA: Diagnosis not present

## 2022-03-29 DIAGNOSIS — H1045 Other chronic allergic conjunctivitis: Secondary | ICD-10-CM | POA: Diagnosis not present

## 2022-03-29 DIAGNOSIS — Z961 Presence of intraocular lens: Secondary | ICD-10-CM | POA: Diagnosis not present

## 2022-03-29 DIAGNOSIS — E119 Type 2 diabetes mellitus without complications: Secondary | ICD-10-CM | POA: Diagnosis not present

## 2022-03-29 DIAGNOSIS — H04123 Dry eye syndrome of bilateral lacrimal glands: Secondary | ICD-10-CM | POA: Diagnosis not present

## 2022-05-01 ENCOUNTER — Other Ambulatory Visit: Payer: Self-pay | Admitting: Family

## 2022-05-07 ENCOUNTER — Other Ambulatory Visit: Payer: Self-pay | Admitting: Family

## 2022-05-07 ENCOUNTER — Other Ambulatory Visit: Payer: Self-pay

## 2022-05-07 DIAGNOSIS — R600 Localized edema: Secondary | ICD-10-CM

## 2022-05-07 MED ORDER — APIXABAN 2.5 MG PO TABS: 2.5000 mg | ORAL_TABLET | Freq: Two times a day (BID) | ORAL | 6 refills | Status: DC

## 2022-05-14 ENCOUNTER — Other Ambulatory Visit: Payer: Self-pay | Admitting: Family

## 2022-05-14 DIAGNOSIS — R600 Localized edema: Secondary | ICD-10-CM

## 2022-05-14 DIAGNOSIS — I5022 Chronic systolic (congestive) heart failure: Secondary | ICD-10-CM

## 2022-06-04 ENCOUNTER — Ambulatory Visit: Payer: HMO | Admitting: Family

## 2022-06-05 ENCOUNTER — Encounter: Payer: HMO | Admitting: Family

## 2022-06-06 ENCOUNTER — Ambulatory Visit: Payer: HMO | Admitting: Family

## 2022-06-07 ENCOUNTER — Encounter: Payer: Self-pay | Admitting: Family

## 2022-06-07 ENCOUNTER — Telehealth (INDEPENDENT_AMBULATORY_CARE_PROVIDER_SITE_OTHER): Payer: HMO | Admitting: Family

## 2022-06-07 DIAGNOSIS — D509 Iron deficiency anemia, unspecified: Secondary | ICD-10-CM

## 2022-06-07 DIAGNOSIS — E1142 Type 2 diabetes mellitus with diabetic polyneuropathy: Secondary | ICD-10-CM

## 2022-06-07 DIAGNOSIS — I5022 Chronic systolic (congestive) heart failure: Secondary | ICD-10-CM | POA: Diagnosis not present

## 2022-06-07 DIAGNOSIS — I4819 Other persistent atrial fibrillation: Secondary | ICD-10-CM

## 2022-06-07 DIAGNOSIS — R600 Localized edema: Secondary | ICD-10-CM

## 2022-06-07 NOTE — Progress Notes (Signed)
This service is provided via telemedicine  No vital signs collected/recorded due to the encounter was a telemedicine visit.   Location of patient (ex: home, work):  Home  Patient consents to a telephone visit:  Yes  Location of the provider (ex: office, home):  [employer].  Name of any referring provider:  Miyeko Mahlum, Nelda Bucks, NP   Names of all persons participating in the telemedicine service and their role in the encounter:  Patient, Daughter [name], Heriberto Antigua, RMA, Kenedee Molesky, Webb Silversmith, NP.    Time spent on call: 8 minutes spent on the phone with Medical Assistant.      Provider: Marlowe Sax FNP-C   Essance Gatti, Nelda Bucks, NP  Patient Care Team: Nancy Arvin, Nelda Bucks, NP as PCP - General (Family Medicine) Constance Haw, MD as PCP - Cardiology (Cardiology) Tomasita Morrow, NP as Nurse Practitioner (Cardiology) Deneise Lever, MD as Consulting Physician (Pulmonary Disease) Kristeen Miss, MD as Consulting Physician (Neurosurgery) Ronny Bacon, MD as Referring Physician (Surgery) [name], Fransico Him., MD (General Surgery) Bo Merino, MD as Consulting Physician (Rheumatology) Clent Jacks, MD as Consulting Physician (Ophthalmology)  Extended Emergency Contact Information Primary Emergency Contact: [name],[name] Mobile Phone: [phone] Relation: Grandson Secondary Emergency Contact: [name],[name] Mobile Phone: [phone] Relation: Daughter  Code Status:  Full Code  Goals of care: Advanced Directive information    06/07/2022    1:57 PM  Advanced Directives  Does Patient Have a Medical Advance Directive? No  Does patient want to make changes to medical advance directive? No - Patient declined     Chief Complaint  Patient presents with   Medical Management of Chronic Issues    6 month follow up   Health Maintenance    Discuss the need for Eye exam, Foot exam, Urine microalbumin, and Hemoglobin A1C.     HPI:  Pt is a 86  y.o. male seen today for 6 months follow up for medical management of chronic diseases.Has a medical history of Hypertension,CHF,Afib,Type 2 DM with Neuropathy,lower extremities edema among others.  He is seen with the wife providing additional HPI information. He was hospitalized May 2023 due to anemia hgb 6.6.  He had occult blood positive found to have gastritis.He was transfused with 2 units of packed red blood cells and started on Protonix 40 mg daily. He denies any acute issues this visit.  Wife states patient has been doing okay.  Appetite is good.  No recent fall episode or hospitalization. He states lower extremity is still swollen but not worsening.He denies any shortness of breath or wheezing on exertion.  He continues to follow-up with a cardiologist Dr. Meredith Leeds for A-fib he was last seen 01/31/2022 no changes on medication was advised to follow-up in 6 months.   Past Medical History:  Diagnosis Date   Anxiety state, unspecified    Atrial fibrillation ([place]) 03/06/2016   CHF (congestive heart failure) (HCC)    Chronic kidney disease (CKD)    Chronic pain syndrome    Constipation    Depressive disorder, not elsewhere classified    Dysphagia, unspecified(787.20)    Eye exam normal 09/01/2019   Diabetic eye exam did not reveal any retinopathy.    Herpes simplex disease    Hyperlipidemia    Hypertension    Hypopotassemia    Insomnia, unspecified    Intestinovesical fistula    Malignant neoplasm of prostate (HCC)    Myalgia and myositis, unspecified    Osteoarthritis    Pain in joint, lower  leg    Bilateral knee pains   Reflux esophagitis    Rheumatoid arthritis with rheumatoid factor (HCC)    Spinal stenosis, unspecified region other than cervical    Thrombocytopenia, unspecified (HCC)    Type II or unspecified type diabetes mellitus without mention of complication, not stated as uncontrolled    Pt states that it is prediabetic   Unspecified arthropathy, pelvic  region and thigh    Unspecified hereditary and idiopathic peripheral neuropathy    Past Surgical History:  Procedure Laterality Date   BIOPSY  12/01/2021   Procedure: BIOPSY;  Surgeon: Ronnette Juniper, MD;  Location: [facility];  Service: Gastroenterology;;   CARDIAC CATHETERIZATION  01/31/2004   Dr Gwenlyn Found   CARDIOVERSION N/A 07/12/2016   Procedure: CARDIOVERSION;  Surgeon: Pixie Casino, MD;  Location: [facility] ENDOSCOPY;  Service: Cardiovascular;  Laterality: N/A;   COLONOSCOPY  08/09/2006   Dr Christian Mate, hemorrhoids/rectal fistula   cystocopy  07/2006   Dr [name]   ESOPHAGOGASTRODUODENOSCOPY N/A 12/01/2021   Procedure: ESOPHAGOGASTRODUODENOSCOPY (EGD);  Surgeon: Ronnette Juniper, MD;  Location: [facility];  Service: Gastroenterology;  Laterality: N/A;   Chloride   TOTAL HIP ARTHROPLASTY Right 02/20/2018   Procedure: RIGHT TOTAL HIP ARTHROPLASTY ANTERIOR APPROACH;  Surgeon: Mcarthur Rossetti, MD;  Location: [facility] ORS;  Service: Orthopedics;  Laterality: Right;    No Known Allergies  Allergies as of 06/07/2022   No Known Allergies      Medication List        Accurate as of June 07, 2022  3:09 PM. If you have any questions, ask your nurse or doctor.          acetaminophen 500 MG tablet Commonly known as: TYLENOL Take 1 tablet (500 mg total) by mouth every 8 (eight) hours as needed for moderate pain.   apixaban 2.5 MG Tabs tablet Commonly known as: ELIQUIS Take 1 tablet (2.5 mg total) by mouth 2 (two) times daily.   cromolyn 4 % ophthalmic solution Commonly known as: OPTICROM Place 1 drop into both eyes 4 (four) times daily.   furosemide 40 MG tablet Commonly known as: LASIX Take 1 tablet (40 mg total) by mouth 2 (two) times daily.   Klor-Con M20 20 MEQ tablet Generic drug: potassium chloride SA TAKE 2 TABLETS BY MOUTH DAILY WHEN TAKING LASIX (FUROSMIDE)   pantoprazole 40 MG tablet Commonly known as: PROTONIX TAKE 1  TABLET BY MOUTH EVERY DAY   polyethylene glycol 17 g packet Commonly known as: MIRALAX / GLYCOLAX Take 17 g by mouth daily.   PRESERVISION AREDS 2 PO Take 1 tablet by mouth daily.        Review of Systems  Constitutional:  Negative for appetite change, chills, fatigue, fever and unexpected weight change.  HENT:  Positive for hearing loss. Negative for congestion, dental problem, ear discharge, ear pain, facial swelling, nosebleeds, postnasal drip, rhinorrhea, sinus pressure, sinus pain, sneezing, sore throat, tinnitus and trouble swallowing.   Eyes:  Negative for pain, discharge, redness, itching and visual disturbance.  Respiratory:  Negative for cough, chest tightness, shortness of breath and wheezing.   Cardiovascular:  Positive for leg swelling. Negative for chest pain and palpitations.  Gastrointestinal:  Negative for abdominal distention, abdominal pain, blood in stool, constipation, diarrhea, nausea and vomiting.  Endocrine: Negative for cold intolerance, heat intolerance, polydipsia, polyphagia and polyuria.  Genitourinary:  Negative for difficulty urinating, dysuria, flank pain, frequency and urgency.  Musculoskeletal:  Negative  for arthralgias, back pain, gait problem, joint swelling, myalgias, neck pain and neck stiffness.  Skin:  Negative for color change, pallor, rash and wound.  Neurological:  Negative for dizziness, syncope, speech difficulty, weakness, light-headedness, numbness and headaches.  Hematological:  Does not bruise/bleed easily.  Psychiatric/Behavioral:  Negative for agitation, behavioral problems, confusion, hallucinations, self-injury, sleep disturbance and suicidal ideas. The patient is not nervous/anxious.     Immunization History  Administered Date(s) Administered   Fluad Quad(high Dose 65+) 10/19/2019   Influenza, High Dose Seasonal PF 04/18/2017, 05/12/2018   Influenza,inj,Quad PF,6+ Mos 06/21/2015   Pneumococcal Conjugate-13 06/21/2015    Pneumococcal Polysaccharide-23 11/15/2016   Pertinent  Health Maintenance Due  Topic Date Due   OPHTHALMOLOGY EXAM  08/31/2020   FOOT EXAM  05/02/2021   HEMOGLOBIN A1C  06/01/2022   INFLUENZA VACCINE  Discontinued      12/01/2021    9:55 AM 12/05/2021    3:07 PM 12/26/2021    2:37 PM 01/02/2022   12:37 PM 06/07/2022    1:57 PM  Fall Risk  Falls in the past year?  0 0 0 0  Was there an injury with Fall?  0 0 0 0  Fall Risk Category Calculator  0 0 0 0  Fall Risk Category  Low Low Low Low  Patient Fall Risk Level High fall risk Low fall risk Low fall risk  Low fall risk  Patient at Risk for Falls Due to  No Fall Risks No Fall Risks  No Fall Risks  Fall risk Follow up  Falls evaluation completed Falls evaluation completed  Falls evaluation completed   Functional Status Survey:    There were no vitals filed for this visit. There is no height or weight on file to calculate BMI. Physical Exam Constitutional:      General: He is not in acute distress.    Appearance: He is not ill-appearing.  Pulmonary:     Effort: Pulmonary effort is normal.  Neurological:     Mental Status: He is alert.  Psychiatric:        Mood and Affect: Mood normal.        Behavior: Behavior normal.    Labs reviewed: Recent Labs    11/29/21 2036 11/30/21 1210 12/26/21 1513 01/31/22 1430 03/07/22 1357  NA 143   < > 142 143 143  K 3.2*   < > 4.3 3.8 3.2*  CL 110   < > 106 104 99  CO2 24   < > _0 GLUCOSE 97   < > 102* 100* 106*  BUN 26*   < > _1 CREATININE 1.58*   < > 1.62* 1.50* 2.15*  CALCIUM 8.7*   < > 8.9 8.5* 8.8  MG 2.4  --   --   --   --   PHOS 3.3  --   --   --   --    < > = values in this interval not displayed.   Recent Labs    11/28/21 0945 11/29/21 2036 11/30/21 1210  AST _2 ALT _3 ALKPHOS  --  59 51  BILITOT 0.9 0.9 2.1*  PROT 6.8 7.6 6.9  ALBUMIN  --  4.2 3.9   Recent Labs    11/29/21 2036 11/30/21 0026 12/05/21 1545 12/26/21 1513  01/31/22 1430  WBC 6.5   < > 5.7 6.5 6.9  NEUTROABS 4.0  --  3,483 4,004  --  HGB 6.6*   < > 8.2* 8.7* 7.5*  HCT 22.5*   < > 27.5* 28.3* 24.0*  MCV 85.6   < > 83.6 83.0 80  PLT 213   < > 162 196 225   < > = values in this interval not displayed.   Lab Results  Component Value Date   TSH 1.651 11/29/2021   Lab Results  Component Value Date   HGBA1C 6.2 (H) 11/29/2021   Lab Results  Component Value Date   CHOL 124 11/28/2021   HDL 47 11/28/2021   LDLCALC 65 11/28/2021   TRIG 50 11/28/2021   CHOLHDL 2.6 11/28/2021    Significant Diagnostic Results in last 30 days:  No results found.  Assessment/Plan 1. DM type 2 with diabetic peripheral neuropathy (HCC) Lab Results  Component Value Date   HGBA1C 6.2 (H) 11/29/2021  -Diet controlled - CBC with Differential/Platelet - CMP with eGFR([facility]) - TSH - Hemoglobin A1c - Lipid Panel - Microalbumin / creatinine urine ratio  2. Chronic systolic congestive heart failure (HCC) Denies signs of fluid overload -Continue on furosemide -Continue to follow-up with a cardiologist -Advised to keep legs elevated when seated to keep swelling down  - check weight at least three times per week and notify provider for any abrupt weight gain > 3 lbs  - Reduce salt intake in diet  - CBC with Differential/Platelet - CMP with eGFR([facility])  3. Iron deficiency anemia, unspecified iron deficiency anemia type Hgb 6.6 in 11/29/2021 s/p hospitalization with gastritis with positive occult blood transfused 2 units PRBC - CBC with Differential/Platelet  4. Persistent atrial fibrillation (HCC) HR controlled  - continue on apixaban - continue to follow up with Cardiologist - CBC with Differential/Platelet  5. Edema of both lower extremities Reports no worsening swelling or abrupt weight. - CMP with eGFR([facility])  Family/ staff Communication: Reviewed plan of care with patient and wife verbalized understanding  Labs/tests ordered:  - CBC with  Differential/Platelet - CMP with eGFR([facility]) - TSH - Hemoglobin A1c - Lipid Panel - Microalbumin / creatinine urine ratio  Next Appointment : Return in about 6 months (around 12/06/2022) for fasting labs in one week.  I connected with  Earnestine Leys on 06/14/22 by a video enabled telemedicine application and verified that I am speaking with the correct person using two identifiers.   I discussed the limitations of evaluation and management by telemedicine. The patient expressed understanding and agreed to proceed.  Spent 22 minutes of face to face with patient  >50% time spent counseling; reviewing medical record; tests; labs; and developing future plan of care.  Sandrea Hughs, NP

## 2022-06-12 ENCOUNTER — Other Ambulatory Visit: Payer: HMO

## 2022-06-13 ENCOUNTER — Other Ambulatory Visit: Payer: HMO

## 2022-06-13 ENCOUNTER — Other Ambulatory Visit: Payer: Self-pay | Admitting: Family

## 2022-06-14 LAB — COMPLETE METABOLIC PANEL WITH GFR
AG Ratio: 1.5 (calc) (ref 1.0–2.5)
ALT: 5 U/L — ABNORMAL LOW (ref 9–46)
AST: 14 U/L (ref 10–35)
Albumin: 4 g/dL (ref 3.6–5.1)
Alkaline phosphatase (APISO): 59 U/L (ref 35–144)
BUN/Creatinine Ratio: 11 (calc) (ref 6–22)
BUN: 19 mg/dL (ref 7–25)
CO2: 29 mmol/L (ref 20–32)
Calcium: 8.2 mg/dL — ABNORMAL LOW (ref 8.6–10.3)
Chloride: 106 mmol/L (ref 98–110)
Creat: 1.78 mg/dL — ABNORMAL HIGH (ref 0.70–1.22)
Globulin: 2.6 g/dL (calc) (ref 1.9–3.7)
Glucose, Bld: 101 mg/dL — ABNORMAL HIGH (ref 65–99)
Potassium: 3.7 mmol/L (ref 3.5–5.3)
Sodium: 145 mmol/L (ref 135–146)
Total Bilirubin: 0.8 mg/dL (ref 0.2–1.2)
Total Protein: 6.6 g/dL (ref 6.1–8.1)
eGFR: 37 mL/min/{1.73_m2} — ABNORMAL LOW (ref >=60)

## 2022-06-14 LAB — CBC WITH DIFFERENTIAL/PLATELET
Absolute Monocytes: 515 cells/uL (ref 200–950)
Basophils Absolute: 50 cells/uL (ref 0–200)
Basophils Relative: 0.8 %
Eosinophils Absolute: 149 cells/uL (ref 15–500)
Eosinophils Relative: 2.4 %
HCT: 25 % — ABNORMAL LOW (ref 38.5–50.0)
Hemoglobin: 7.8 g/dL — ABNORMAL LOW (ref 13.2–17.1)
Lymphs Abs: 2399 cells/uL (ref 850–3900)
MCH: 28.1 pg (ref 27.0–33.0)
MCHC: 31.2 g/dL — ABNORMAL LOW (ref 32.0–36.0)
MCV: 89.9 fL (ref 80.0–100.0)
MPV: 11.6 fL (ref 7.5–12.5)
Monocytes Relative: 8.3 %
Neutro Abs: 3088 cells/uL (ref 1500–7800)
Neutrophils Relative %: 49.8 %
Platelets: 203 10*3/uL (ref 140–400)
RBC: 2.78 10*6/uL — ABNORMAL LOW (ref 4.20–5.80)
RDW: 15.3 % — ABNORMAL HIGH (ref 11.0–15.0)
Total Lymphocyte: 38.7 %
WBC: 6.2 10*3/uL (ref 3.8–10.8)

## 2022-06-14 LAB — LIPID PANEL
Cholesterol: 137 mg/dL (ref <200)
HDL: 38 mg/dL — ABNORMAL LOW (ref >=40)
LDL Cholesterol (Calc): 85 mg/dL (calc)
Non-HDL Cholesterol (Calc): 99 mg/dL (calc) (ref <130)
Total CHOL/HDL Ratio: 3.6 (calc) (ref <5.0)
Triglycerides: 63 mg/dL (ref <150)

## 2022-06-14 LAB — HEMOGLOBIN A1C
Hgb A1c MFr Bld: 6.4 % of total Hgb — ABNORMAL HIGH (ref <5.7)
Mean Plasma Glucose: 137 mg/dL
eAG (mmol/L): 7.6 mmol/L

## 2022-06-14 LAB — MICROALBUMIN / CREATININE URINE RATIO
Creatinine, Urine: 31 mg/dL (ref 20–320)
Microalb Creat Ratio: 97 mcg/mg creat — ABNORMAL HIGH (ref <30)
Microalb, Ur: 3 mg/dL

## 2022-06-14 LAB — TSH: TSH: 4.89 mIU/L — ABNORMAL HIGH (ref 0.40–4.50)

## 2022-06-15 NOTE — Progress Notes (Signed)
  This encounter was created in error - please disregard. No show 

## 2022-07-17 ENCOUNTER — Telehealth: Payer: Self-pay | Admitting: Podiatry

## 2022-07-17 NOTE — Telephone Encounter (Signed)
Pts daughter called and left message at lunch time today that her dad is refusing to come to appt. She is asking what the cost would be and maybe she can talk him into coming because his pcp referred pt here. She may tell him she will pay for it.  I called and left message for pts daughter to call back to discuss further if we are canceling the appt that the copay for the visit will be 20.00 for a specialist.

## 2022-07-18 ENCOUNTER — Ambulatory Visit: Payer: HMO | Admitting: Podiatry

## 2022-08-01 ENCOUNTER — Telehealth: Payer: Self-pay | Admitting: *Deleted

## 2022-08-01 ENCOUNTER — Encounter: Payer: Self-pay | Admitting: *Deleted

## 2022-08-01 NOTE — Telephone Encounter (Signed)
Have attempted to reach this pt for several months now, unable to leave message to call back. MyChart account has not been viewed since 2021.  Pt needing to establish with general cardiology per Dr. Curt Bears. Will send a letter to home address on file asking pt to call office to discuss this recommendation and educate him as to why he needs a general cardiologist.

## 2022-10-10 ENCOUNTER — Telehealth (INDEPENDENT_AMBULATORY_CARE_PROVIDER_SITE_OTHER): Payer: HMO | Admitting: Family

## 2022-10-10 DIAGNOSIS — S81802A Unspecified open wound, left lower leg, initial encounter: Secondary | ICD-10-CM

## 2022-10-10 DIAGNOSIS — R2681 Unsteadiness on feet: Secondary | ICD-10-CM | POA: Diagnosis not present

## 2022-10-10 DIAGNOSIS — L03116 Cellulitis of left lower limb: Secondary | ICD-10-CM | POA: Diagnosis not present

## 2022-10-10 MED ORDER — DOXYCYCLINE HYCLATE 100 MG PO TABS: 100.0000 mg | ORAL_TABLET | Freq: Two times a day (BID) | ORAL | 0 refills | Status: AC

## 2022-10-10 NOTE — Patient Instructions (Signed)
1. Home health Nurse to cleanse left leg wound with saline,pat dry,apply calcium alginate to open areas,cover with 4 X 4 foam dressing for absorption and extra protection then wrap leg from base of the toes with Kerlix and ACE wrap to keep swelling down.change dressing every three days and as needed if soiled until resolved. Notify provider for any odor ,redness or worsening symptoms.   2. Draw CBC/diff and CMP then fax results to [facility] [place] office at  [phone]
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
## 2022-10-12 DIAGNOSIS — I4819 Other persistent atrial fibrillation: Secondary | ICD-10-CM | POA: Diagnosis not present

## 2022-10-12 DIAGNOSIS — G894 Chronic pain syndrome: Secondary | ICD-10-CM | POA: Diagnosis not present

## 2022-10-12 DIAGNOSIS — D696 Thrombocytopenia, unspecified: Secondary | ICD-10-CM | POA: Diagnosis not present

## 2022-10-12 DIAGNOSIS — R1310 Dysphagia, unspecified: Secondary | ICD-10-CM | POA: Diagnosis not present

## 2022-10-12 DIAGNOSIS — K2100 Gastro-esophageal reflux disease with esophagitis, without bleeding: Secondary | ICD-10-CM | POA: Diagnosis not present

## 2022-10-12 DIAGNOSIS — D6869 Other thrombophilia: Secondary | ICD-10-CM | POA: Diagnosis not present

## 2022-10-12 DIAGNOSIS — H9190 Unspecified hearing loss, unspecified ear: Secondary | ICD-10-CM | POA: Diagnosis not present

## 2022-10-12 DIAGNOSIS — I509 Heart failure, unspecified: Secondary | ICD-10-CM | POA: Diagnosis not present

## 2022-10-12 DIAGNOSIS — L97821 Non-pressure chronic ulcer of other part of left lower leg limited to breakdown of skin: Secondary | ICD-10-CM | POA: Diagnosis not present

## 2022-10-12 DIAGNOSIS — M059 Rheumatoid arthritis with rheumatoid factor, unspecified: Secondary | ICD-10-CM | POA: Diagnosis not present

## 2022-10-12 DIAGNOSIS — I130 Hypertensive heart and chronic kidney disease with heart failure and stage 1 through stage 4 chronic kidney disease, or unspecified chronic kidney disease: Secondary | ICD-10-CM | POA: Diagnosis not present

## 2022-10-12 DIAGNOSIS — E1142 Type 2 diabetes mellitus with diabetic polyneuropathy: Secondary | ICD-10-CM | POA: Diagnosis not present

## 2022-10-12 DIAGNOSIS — F32A Depression, unspecified: Secondary | ICD-10-CM | POA: Diagnosis not present

## 2022-10-12 DIAGNOSIS — I872 Venous insufficiency (chronic) (peripheral): Secondary | ICD-10-CM | POA: Diagnosis not present

## 2022-10-12 DIAGNOSIS — E1122 Type 2 diabetes mellitus with diabetic chronic kidney disease: Secondary | ICD-10-CM | POA: Diagnosis not present

## 2022-10-12 DIAGNOSIS — S81802D Unspecified open wound, left lower leg, subsequent encounter: Secondary | ICD-10-CM | POA: Diagnosis not present

## 2022-10-12 DIAGNOSIS — D631 Anemia in chronic kidney disease: Secondary | ICD-10-CM | POA: Diagnosis not present

## 2022-10-12 DIAGNOSIS — N189 Chronic kidney disease, unspecified: Secondary | ICD-10-CM | POA: Diagnosis not present

## 2022-10-12 DIAGNOSIS — E1151 Type 2 diabetes mellitus with diabetic peripheral angiopathy without gangrene: Secondary | ICD-10-CM | POA: Diagnosis not present

## 2022-10-12 DIAGNOSIS — E785 Hyperlipidemia, unspecified: Secondary | ICD-10-CM | POA: Diagnosis not present

## 2022-10-12 DIAGNOSIS — G609 Hereditary and idiopathic neuropathy, unspecified: Secondary | ICD-10-CM | POA: Diagnosis not present

## 2022-10-12 DIAGNOSIS — L03116 Cellulitis of left lower limb: Secondary | ICD-10-CM | POA: Diagnosis not present

## 2022-10-12 DIAGNOSIS — G4700 Insomnia, unspecified: Secondary | ICD-10-CM | POA: Diagnosis not present

## 2022-10-12 DIAGNOSIS — Z556 Problems related to health literacy: Secondary | ICD-10-CM | POA: Diagnosis not present

## 2022-10-12 DIAGNOSIS — M1990 Unspecified osteoarthritis, unspecified site: Secondary | ICD-10-CM | POA: Diagnosis not present

## 2022-10-12 DIAGNOSIS — I4891 Unspecified atrial fibrillation: Secondary | ICD-10-CM | POA: Diagnosis not present

## 2022-10-12 DIAGNOSIS — D509 Iron deficiency anemia, unspecified: Secondary | ICD-10-CM | POA: Diagnosis not present

## 2022-10-12 DIAGNOSIS — M4800 Spinal stenosis, site unspecified: Secondary | ICD-10-CM | POA: Diagnosis not present

## 2022-10-12 DIAGNOSIS — Z96641 Presence of right artificial hip joint: Secondary | ICD-10-CM | POA: Diagnosis not present

## 2022-10-12 DIAGNOSIS — F411 Generalized anxiety disorder: Secondary | ICD-10-CM | POA: Diagnosis not present

## 2022-10-12 DIAGNOSIS — E669 Obesity, unspecified: Secondary | ICD-10-CM | POA: Diagnosis not present

## 2022-10-12 DIAGNOSIS — Z8546 Personal history of malignant neoplasm of prostate: Secondary | ICD-10-CM | POA: Diagnosis not present

## 2022-10-12 DIAGNOSIS — Z7901 Long term (current) use of anticoagulants: Secondary | ICD-10-CM | POA: Diagnosis not present

## 2022-10-12 DIAGNOSIS — K219 Gastro-esophageal reflux disease without esophagitis: Secondary | ICD-10-CM | POA: Diagnosis not present

## 2022-10-16 DIAGNOSIS — L03116 Cellulitis of left lower limb: Secondary | ICD-10-CM | POA: Diagnosis not present

## 2022-10-17 ENCOUNTER — Telehealth: Payer: Self-pay

## 2022-10-17 LAB — COMPREHENSIVE METABOLIC PANEL
Albumin: 4.1 (ref 3.5–5.0)
Calcium: 8.2 — AB (ref 8.7–10.7)
Globulin: 1.9
eGFR: 40

## 2022-10-17 LAB — HEPATIC FUNCTION PANEL
ALT: 10 U/L (ref 10–40)
AST: 17 (ref 14–40)
Alkaline Phosphatase: 65 (ref 25–125)
Bilirubin, Total: 0.3

## 2022-10-17 LAB — BASIC METABOLIC PANEL
BUN: 24 — AB (ref 4–21)
CO2: 25 — AB (ref 13–22)
Chloride: 106 (ref 99–108)
Creatinine: 1.7 — AB (ref 0.6–1.3)
Glucose: 68
Potassium: 4.4 mEq/L (ref 3.5–5.1)
Sodium: 143 (ref 137–147)

## 2022-10-17 LAB — CBC AND DIFFERENTIAL
HCT: 24 — AB (ref 41–53)
Hemoglobin: 7.3 — AB (ref 13.5–17.5)
Neutrophils Absolute: 4.3
Platelets: 237 10*3/uL (ref 150–400)
WBC: 8.3

## 2022-10-17 LAB — CBC: RBC: 2.9 — AB (ref 3.87–5.11)

## 2022-10-17 NOTE — Telephone Encounter (Signed)
[name] RN with [facility] called requesting verbal orders for unna boot for left leg  Verbal orders were given.

## 2022-10-18 ENCOUNTER — Telehealth: Payer: Self-pay | Admitting: Family

## 2022-10-18 DIAGNOSIS — D509 Iron deficiency anemia, unspecified: Secondary | ICD-10-CM

## 2022-10-18 NOTE — Telephone Encounter (Signed)
Lab results received Hemoglobin level is lower than previous level.Recommend collecting stool specimen to check for blood in the stool.may pickup iFOBT supplies from the office.

## 2022-10-18 NOTE — Telephone Encounter (Signed)
-----   Message from Carroll Kinds, [place] sent at 10/17/2022  4:03 PM EDT ----- Abstracted

## 2022-10-22 DIAGNOSIS — I130 Hypertensive heart and chronic kidney disease with heart failure and stage 1 through stage 4 chronic kidney disease, or unspecified chronic kidney disease: Secondary | ICD-10-CM | POA: Diagnosis not present

## 2022-10-22 DIAGNOSIS — M4800 Spinal stenosis, site unspecified: Secondary | ICD-10-CM | POA: Diagnosis not present

## 2022-10-22 DIAGNOSIS — L03116 Cellulitis of left lower limb: Secondary | ICD-10-CM | POA: Diagnosis not present

## 2022-10-22 DIAGNOSIS — I509 Heart failure, unspecified: Secondary | ICD-10-CM | POA: Diagnosis not present

## 2022-10-22 DIAGNOSIS — E1122 Type 2 diabetes mellitus with diabetic chronic kidney disease: Secondary | ICD-10-CM | POA: Diagnosis not present

## 2022-10-22 DIAGNOSIS — F32A Depression, unspecified: Secondary | ICD-10-CM | POA: Diagnosis not present

## 2022-10-22 DIAGNOSIS — G894 Chronic pain syndrome: Secondary | ICD-10-CM | POA: Diagnosis not present

## 2022-10-22 DIAGNOSIS — I4891 Unspecified atrial fibrillation: Secondary | ICD-10-CM | POA: Diagnosis not present

## 2022-10-22 DIAGNOSIS — F411 Generalized anxiety disorder: Secondary | ICD-10-CM | POA: Diagnosis not present

## 2022-10-22 DIAGNOSIS — Z556 Problems related to health literacy: Secondary | ICD-10-CM | POA: Diagnosis not present

## 2022-10-22 DIAGNOSIS — Z7901 Long term (current) use of anticoagulants: Secondary | ICD-10-CM | POA: Diagnosis not present

## 2022-10-22 DIAGNOSIS — M059 Rheumatoid arthritis with rheumatoid factor, unspecified: Secondary | ICD-10-CM | POA: Diagnosis not present

## 2022-10-22 DIAGNOSIS — Z96641 Presence of right artificial hip joint: Secondary | ICD-10-CM | POA: Diagnosis not present

## 2022-10-22 DIAGNOSIS — S81802D Unspecified open wound, left lower leg, subsequent encounter: Secondary | ICD-10-CM | POA: Diagnosis not present

## 2022-10-22 DIAGNOSIS — E785 Hyperlipidemia, unspecified: Secondary | ICD-10-CM | POA: Diagnosis not present

## 2022-10-22 DIAGNOSIS — H9190 Unspecified hearing loss, unspecified ear: Secondary | ICD-10-CM | POA: Diagnosis not present

## 2022-10-22 DIAGNOSIS — Z8546 Personal history of malignant neoplasm of prostate: Secondary | ICD-10-CM | POA: Diagnosis not present

## 2022-10-22 DIAGNOSIS — N189 Chronic kidney disease, unspecified: Secondary | ICD-10-CM | POA: Diagnosis not present

## 2022-10-22 DIAGNOSIS — D696 Thrombocytopenia, unspecified: Secondary | ICD-10-CM | POA: Diagnosis not present

## 2022-10-22 DIAGNOSIS — G4700 Insomnia, unspecified: Secondary | ICD-10-CM | POA: Diagnosis not present

## 2022-10-22 DIAGNOSIS — R1310 Dysphagia, unspecified: Secondary | ICD-10-CM | POA: Diagnosis not present

## 2022-10-22 DIAGNOSIS — G609 Hereditary and idiopathic neuropathy, unspecified: Secondary | ICD-10-CM | POA: Diagnosis not present

## 2022-10-22 DIAGNOSIS — K2100 Gastro-esophageal reflux disease with esophagitis, without bleeding: Secondary | ICD-10-CM | POA: Diagnosis not present

## 2022-10-22 DIAGNOSIS — M1990 Unspecified osteoarthritis, unspecified site: Secondary | ICD-10-CM | POA: Diagnosis not present

## 2022-10-22 NOTE — Addendum Note (Signed)
Addended by: Carroll Kinds on: 10/22/2022 12:28 PM   Modules accepted: Orders

## 2022-10-22 NOTE — Telephone Encounter (Signed)
Spoke with patient's daughter on 10/18/22 and she verbalized her understanding. She requested the IFOB kit be mailed to patient.

## 2022-10-22 NOTE — Addendum Note (Signed)
Addended by: Carroll Kinds on: 10/22/2022 12:27 PM   Modules accepted: Orders

## 2022-10-25 DIAGNOSIS — G4700 Insomnia, unspecified: Secondary | ICD-10-CM | POA: Diagnosis not present

## 2022-10-25 DIAGNOSIS — G609 Hereditary and idiopathic neuropathy, unspecified: Secondary | ICD-10-CM | POA: Diagnosis not present

## 2022-10-25 DIAGNOSIS — D696 Thrombocytopenia, unspecified: Secondary | ICD-10-CM | POA: Diagnosis not present

## 2022-10-25 DIAGNOSIS — M4800 Spinal stenosis, site unspecified: Secondary | ICD-10-CM | POA: Diagnosis not present

## 2022-10-25 DIAGNOSIS — M1990 Unspecified osteoarthritis, unspecified site: Secondary | ICD-10-CM | POA: Diagnosis not present

## 2022-10-25 DIAGNOSIS — R1310 Dysphagia, unspecified: Secondary | ICD-10-CM | POA: Diagnosis not present

## 2022-10-25 DIAGNOSIS — I509 Heart failure, unspecified: Secondary | ICD-10-CM | POA: Diagnosis not present

## 2022-10-25 DIAGNOSIS — M059 Rheumatoid arthritis with rheumatoid factor, unspecified: Secondary | ICD-10-CM | POA: Diagnosis not present

## 2022-10-25 DIAGNOSIS — N189 Chronic kidney disease, unspecified: Secondary | ICD-10-CM | POA: Diagnosis not present

## 2022-10-25 DIAGNOSIS — H9190 Unspecified hearing loss, unspecified ear: Secondary | ICD-10-CM | POA: Diagnosis not present

## 2022-10-25 DIAGNOSIS — I4891 Unspecified atrial fibrillation: Secondary | ICD-10-CM | POA: Diagnosis not present

## 2022-10-25 DIAGNOSIS — I130 Hypertensive heart and chronic kidney disease with heart failure and stage 1 through stage 4 chronic kidney disease, or unspecified chronic kidney disease: Secondary | ICD-10-CM | POA: Diagnosis not present

## 2022-10-25 DIAGNOSIS — F411 Generalized anxiety disorder: Secondary | ICD-10-CM | POA: Diagnosis not present

## 2022-10-25 DIAGNOSIS — Z96641 Presence of right artificial hip joint: Secondary | ICD-10-CM | POA: Diagnosis not present

## 2022-10-25 DIAGNOSIS — F32A Depression, unspecified: Secondary | ICD-10-CM | POA: Diagnosis not present

## 2022-10-25 DIAGNOSIS — G894 Chronic pain syndrome: Secondary | ICD-10-CM | POA: Diagnosis not present

## 2022-10-25 DIAGNOSIS — Z7901 Long term (current) use of anticoagulants: Secondary | ICD-10-CM | POA: Diagnosis not present

## 2022-10-25 DIAGNOSIS — L03116 Cellulitis of left lower limb: Secondary | ICD-10-CM | POA: Diagnosis not present

## 2022-10-25 DIAGNOSIS — S81802D Unspecified open wound, left lower leg, subsequent encounter: Secondary | ICD-10-CM | POA: Diagnosis not present

## 2022-10-25 DIAGNOSIS — K2100 Gastro-esophageal reflux disease with esophagitis, without bleeding: Secondary | ICD-10-CM | POA: Diagnosis not present

## 2022-10-25 DIAGNOSIS — Z556 Problems related to health literacy: Secondary | ICD-10-CM | POA: Diagnosis not present

## 2022-10-25 DIAGNOSIS — E1122 Type 2 diabetes mellitus with diabetic chronic kidney disease: Secondary | ICD-10-CM | POA: Diagnosis not present

## 2022-10-25 DIAGNOSIS — E785 Hyperlipidemia, unspecified: Secondary | ICD-10-CM | POA: Diagnosis not present

## 2022-10-25 DIAGNOSIS — Z8546 Personal history of malignant neoplasm of prostate: Secondary | ICD-10-CM | POA: Diagnosis not present

## 2022-11-01 DIAGNOSIS — G609 Hereditary and idiopathic neuropathy, unspecified: Secondary | ICD-10-CM | POA: Diagnosis not present

## 2022-11-01 DIAGNOSIS — I509 Heart failure, unspecified: Secondary | ICD-10-CM | POA: Diagnosis not present

## 2022-11-01 DIAGNOSIS — Z8546 Personal history of malignant neoplasm of prostate: Secondary | ICD-10-CM | POA: Diagnosis not present

## 2022-11-01 DIAGNOSIS — Z556 Problems related to health literacy: Secondary | ICD-10-CM | POA: Diagnosis not present

## 2022-11-01 DIAGNOSIS — S81802D Unspecified open wound, left lower leg, subsequent encounter: Secondary | ICD-10-CM | POA: Diagnosis not present

## 2022-11-01 DIAGNOSIS — M1990 Unspecified osteoarthritis, unspecified site: Secondary | ICD-10-CM | POA: Diagnosis not present

## 2022-11-01 DIAGNOSIS — E1122 Type 2 diabetes mellitus with diabetic chronic kidney disease: Secondary | ICD-10-CM | POA: Diagnosis not present

## 2022-11-01 DIAGNOSIS — N189 Chronic kidney disease, unspecified: Secondary | ICD-10-CM | POA: Diagnosis not present

## 2022-11-01 DIAGNOSIS — G894 Chronic pain syndrome: Secondary | ICD-10-CM | POA: Diagnosis not present

## 2022-11-01 DIAGNOSIS — F411 Generalized anxiety disorder: Secondary | ICD-10-CM | POA: Diagnosis not present

## 2022-11-01 DIAGNOSIS — M4800 Spinal stenosis, site unspecified: Secondary | ICD-10-CM | POA: Diagnosis not present

## 2022-11-01 DIAGNOSIS — M059 Rheumatoid arthritis with rheumatoid factor, unspecified: Secondary | ICD-10-CM | POA: Diagnosis not present

## 2022-11-01 DIAGNOSIS — I130 Hypertensive heart and chronic kidney disease with heart failure and stage 1 through stage 4 chronic kidney disease, or unspecified chronic kidney disease: Secondary | ICD-10-CM | POA: Diagnosis not present

## 2022-11-01 DIAGNOSIS — R1310 Dysphagia, unspecified: Secondary | ICD-10-CM | POA: Diagnosis not present

## 2022-11-01 DIAGNOSIS — K2100 Gastro-esophageal reflux disease with esophagitis, without bleeding: Secondary | ICD-10-CM | POA: Diagnosis not present

## 2022-11-01 DIAGNOSIS — Z96641 Presence of right artificial hip joint: Secondary | ICD-10-CM | POA: Diagnosis not present

## 2022-11-01 DIAGNOSIS — F32A Depression, unspecified: Secondary | ICD-10-CM | POA: Diagnosis not present

## 2022-11-01 DIAGNOSIS — G4700 Insomnia, unspecified: Secondary | ICD-10-CM | POA: Diagnosis not present

## 2022-11-01 DIAGNOSIS — H9190 Unspecified hearing loss, unspecified ear: Secondary | ICD-10-CM | POA: Diagnosis not present

## 2022-11-01 DIAGNOSIS — D696 Thrombocytopenia, unspecified: Secondary | ICD-10-CM | POA: Diagnosis not present

## 2022-11-01 DIAGNOSIS — I4891 Unspecified atrial fibrillation: Secondary | ICD-10-CM | POA: Diagnosis not present

## 2022-11-01 DIAGNOSIS — Z7901 Long term (current) use of anticoagulants: Secondary | ICD-10-CM | POA: Diagnosis not present

## 2022-11-01 DIAGNOSIS — L03116 Cellulitis of left lower limb: Secondary | ICD-10-CM | POA: Diagnosis not present

## 2022-11-01 DIAGNOSIS — E785 Hyperlipidemia, unspecified: Secondary | ICD-10-CM | POA: Diagnosis not present

## 2022-11-05 ENCOUNTER — Telehealth: Payer: Self-pay

## 2022-11-05 NOTE — Telephone Encounter (Signed)
Please verify if patient is taking Furosemide 40 mg tablet twice daily as prescribed.

## 2022-11-05 NOTE — Telephone Encounter (Signed)
[name] with [facility] called stating that patient has some fluid retention. Patient's weight today is 198 lb abd on April 8th patient's weight was 178 lbs. She stated that patient would like a referral to cardiologist. I informed her that on Jan. 10, 2024 that [facility] at [address] had placed a telephone encounter stating they have been trying to contact patient for several months with no success. She took down the contact information for heartcare and will give it to the patient to call.  Message sent to Richarda Blade, NP

## 2022-11-06 NOTE — Telephone Encounter (Signed)
Patient has not been taking medication regularly due to frequent urination

## 2022-11-06 NOTE — Telephone Encounter (Signed)
Recommend taking Furosemide as prescribed and check weight daily then notify provider for any abrupt weight gain > 3 lbs from baseline.

## 2022-11-08 DIAGNOSIS — N189 Chronic kidney disease, unspecified: Secondary | ICD-10-CM | POA: Diagnosis not present

## 2022-11-08 DIAGNOSIS — S81802D Unspecified open wound, left lower leg, subsequent encounter: Secondary | ICD-10-CM | POA: Diagnosis not present

## 2022-11-08 DIAGNOSIS — M1990 Unspecified osteoarthritis, unspecified site: Secondary | ICD-10-CM | POA: Diagnosis not present

## 2022-11-08 DIAGNOSIS — Z7901 Long term (current) use of anticoagulants: Secondary | ICD-10-CM | POA: Diagnosis not present

## 2022-11-08 DIAGNOSIS — F411 Generalized anxiety disorder: Secondary | ICD-10-CM | POA: Diagnosis not present

## 2022-11-08 DIAGNOSIS — I130 Hypertensive heart and chronic kidney disease with heart failure and stage 1 through stage 4 chronic kidney disease, or unspecified chronic kidney disease: Secondary | ICD-10-CM | POA: Diagnosis not present

## 2022-11-08 DIAGNOSIS — L03116 Cellulitis of left lower limb: Secondary | ICD-10-CM | POA: Diagnosis not present

## 2022-11-08 DIAGNOSIS — Z8546 Personal history of malignant neoplasm of prostate: Secondary | ICD-10-CM | POA: Diagnosis not present

## 2022-11-08 DIAGNOSIS — E1122 Type 2 diabetes mellitus with diabetic chronic kidney disease: Secondary | ICD-10-CM | POA: Diagnosis not present

## 2022-11-08 DIAGNOSIS — D696 Thrombocytopenia, unspecified: Secondary | ICD-10-CM | POA: Diagnosis not present

## 2022-11-08 DIAGNOSIS — H9190 Unspecified hearing loss, unspecified ear: Secondary | ICD-10-CM | POA: Diagnosis not present

## 2022-11-08 DIAGNOSIS — Z556 Problems related to health literacy: Secondary | ICD-10-CM | POA: Diagnosis not present

## 2022-11-08 DIAGNOSIS — R1310 Dysphagia, unspecified: Secondary | ICD-10-CM | POA: Diagnosis not present

## 2022-11-08 DIAGNOSIS — I509 Heart failure, unspecified: Secondary | ICD-10-CM | POA: Diagnosis not present

## 2022-11-08 DIAGNOSIS — G894 Chronic pain syndrome: Secondary | ICD-10-CM | POA: Diagnosis not present

## 2022-11-08 DIAGNOSIS — M059 Rheumatoid arthritis with rheumatoid factor, unspecified: Secondary | ICD-10-CM | POA: Diagnosis not present

## 2022-11-08 DIAGNOSIS — E785 Hyperlipidemia, unspecified: Secondary | ICD-10-CM | POA: Diagnosis not present

## 2022-11-08 DIAGNOSIS — Z96641 Presence of right artificial hip joint: Secondary | ICD-10-CM | POA: Diagnosis not present

## 2022-11-08 DIAGNOSIS — G4700 Insomnia, unspecified: Secondary | ICD-10-CM | POA: Diagnosis not present

## 2022-11-08 DIAGNOSIS — K2100 Gastro-esophageal reflux disease with esophagitis, without bleeding: Secondary | ICD-10-CM | POA: Diagnosis not present

## 2022-11-08 DIAGNOSIS — G609 Hereditary and idiopathic neuropathy, unspecified: Secondary | ICD-10-CM | POA: Diagnosis not present

## 2022-11-08 DIAGNOSIS — M4800 Spinal stenosis, site unspecified: Secondary | ICD-10-CM | POA: Diagnosis not present

## 2022-11-08 DIAGNOSIS — F32A Depression, unspecified: Secondary | ICD-10-CM | POA: Diagnosis not present

## 2022-11-08 DIAGNOSIS — I4891 Unspecified atrial fibrillation: Secondary | ICD-10-CM | POA: Diagnosis not present

## 2022-11-08 NOTE — Telephone Encounter (Signed)
Spoke with [name] and informed her of [name]'s response. I also tried calling patient, but no answer and voicemail not set up. Era Bumpers will also discuss with patient on her visit today.

## 2022-11-22 DIAGNOSIS — S81802D Unspecified open wound, left lower leg, subsequent encounter: Secondary | ICD-10-CM | POA: Diagnosis not present

## 2022-11-22 DIAGNOSIS — G609 Hereditary and idiopathic neuropathy, unspecified: Secondary | ICD-10-CM | POA: Diagnosis not present

## 2022-11-22 DIAGNOSIS — M1990 Unspecified osteoarthritis, unspecified site: Secondary | ICD-10-CM | POA: Diagnosis not present

## 2022-11-22 DIAGNOSIS — E785 Hyperlipidemia, unspecified: Secondary | ICD-10-CM | POA: Diagnosis not present

## 2022-11-22 DIAGNOSIS — F32A Depression, unspecified: Secondary | ICD-10-CM | POA: Diagnosis not present

## 2022-11-22 DIAGNOSIS — E1122 Type 2 diabetes mellitus with diabetic chronic kidney disease: Secondary | ICD-10-CM | POA: Diagnosis not present

## 2022-11-22 DIAGNOSIS — I4891 Unspecified atrial fibrillation: Secondary | ICD-10-CM | POA: Diagnosis not present

## 2022-11-22 DIAGNOSIS — I130 Hypertensive heart and chronic kidney disease with heart failure and stage 1 through stage 4 chronic kidney disease, or unspecified chronic kidney disease: Secondary | ICD-10-CM | POA: Diagnosis not present

## 2022-11-22 DIAGNOSIS — K2100 Gastro-esophageal reflux disease with esophagitis, without bleeding: Secondary | ICD-10-CM | POA: Diagnosis not present

## 2022-11-22 DIAGNOSIS — Z8546 Personal history of malignant neoplasm of prostate: Secondary | ICD-10-CM | POA: Diagnosis not present

## 2022-11-22 DIAGNOSIS — R1310 Dysphagia, unspecified: Secondary | ICD-10-CM | POA: Diagnosis not present

## 2022-11-22 DIAGNOSIS — Z556 Problems related to health literacy: Secondary | ICD-10-CM | POA: Diagnosis not present

## 2022-11-22 DIAGNOSIS — L03116 Cellulitis of left lower limb: Secondary | ICD-10-CM | POA: Diagnosis not present

## 2022-11-22 DIAGNOSIS — H9190 Unspecified hearing loss, unspecified ear: Secondary | ICD-10-CM | POA: Diagnosis not present

## 2022-11-22 DIAGNOSIS — I509 Heart failure, unspecified: Secondary | ICD-10-CM | POA: Diagnosis not present

## 2022-11-22 DIAGNOSIS — N189 Chronic kidney disease, unspecified: Secondary | ICD-10-CM | POA: Diagnosis not present

## 2022-11-22 DIAGNOSIS — D696 Thrombocytopenia, unspecified: Secondary | ICD-10-CM | POA: Diagnosis not present

## 2022-11-22 DIAGNOSIS — M4800 Spinal stenosis, site unspecified: Secondary | ICD-10-CM | POA: Diagnosis not present

## 2022-11-22 DIAGNOSIS — G894 Chronic pain syndrome: Secondary | ICD-10-CM | POA: Diagnosis not present

## 2022-11-22 DIAGNOSIS — Z7901 Long term (current) use of anticoagulants: Secondary | ICD-10-CM | POA: Diagnosis not present

## 2022-11-22 DIAGNOSIS — M059 Rheumatoid arthritis with rheumatoid factor, unspecified: Secondary | ICD-10-CM | POA: Diagnosis not present

## 2022-11-22 DIAGNOSIS — Z96641 Presence of right artificial hip joint: Secondary | ICD-10-CM | POA: Diagnosis not present

## 2022-11-22 DIAGNOSIS — F411 Generalized anxiety disorder: Secondary | ICD-10-CM | POA: Diagnosis not present

## 2022-11-22 DIAGNOSIS — G4700 Insomnia, unspecified: Secondary | ICD-10-CM | POA: Diagnosis not present

## 2022-11-29 DIAGNOSIS — I4891 Unspecified atrial fibrillation: Secondary | ICD-10-CM | POA: Diagnosis not present

## 2022-11-29 DIAGNOSIS — L03116 Cellulitis of left lower limb: Secondary | ICD-10-CM | POA: Diagnosis not present

## 2022-11-29 DIAGNOSIS — F32A Depression, unspecified: Secondary | ICD-10-CM | POA: Diagnosis not present

## 2022-11-29 DIAGNOSIS — Z7901 Long term (current) use of anticoagulants: Secondary | ICD-10-CM | POA: Diagnosis not present

## 2022-11-29 DIAGNOSIS — I509 Heart failure, unspecified: Secondary | ICD-10-CM | POA: Diagnosis not present

## 2022-11-29 DIAGNOSIS — I130 Hypertensive heart and chronic kidney disease with heart failure and stage 1 through stage 4 chronic kidney disease, or unspecified chronic kidney disease: Secondary | ICD-10-CM | POA: Diagnosis not present

## 2022-11-29 DIAGNOSIS — G609 Hereditary and idiopathic neuropathy, unspecified: Secondary | ICD-10-CM | POA: Diagnosis not present

## 2022-11-29 DIAGNOSIS — G4700 Insomnia, unspecified: Secondary | ICD-10-CM | POA: Diagnosis not present

## 2022-11-29 DIAGNOSIS — N189 Chronic kidney disease, unspecified: Secondary | ICD-10-CM | POA: Diagnosis not present

## 2022-11-29 DIAGNOSIS — Z96641 Presence of right artificial hip joint: Secondary | ICD-10-CM | POA: Diagnosis not present

## 2022-11-29 DIAGNOSIS — K2100 Gastro-esophageal reflux disease with esophagitis, without bleeding: Secondary | ICD-10-CM | POA: Diagnosis not present

## 2022-11-29 DIAGNOSIS — Z556 Problems related to health literacy: Secondary | ICD-10-CM | POA: Diagnosis not present

## 2022-11-29 DIAGNOSIS — F411 Generalized anxiety disorder: Secondary | ICD-10-CM | POA: Diagnosis not present

## 2022-11-29 DIAGNOSIS — M4800 Spinal stenosis, site unspecified: Secondary | ICD-10-CM | POA: Diagnosis not present

## 2022-11-29 DIAGNOSIS — R1310 Dysphagia, unspecified: Secondary | ICD-10-CM | POA: Diagnosis not present

## 2022-11-29 DIAGNOSIS — M1990 Unspecified osteoarthritis, unspecified site: Secondary | ICD-10-CM | POA: Diagnosis not present

## 2022-11-29 DIAGNOSIS — M059 Rheumatoid arthritis with rheumatoid factor, unspecified: Secondary | ICD-10-CM | POA: Diagnosis not present

## 2022-11-29 DIAGNOSIS — E1122 Type 2 diabetes mellitus with diabetic chronic kidney disease: Secondary | ICD-10-CM | POA: Diagnosis not present

## 2022-11-29 DIAGNOSIS — H9190 Unspecified hearing loss, unspecified ear: Secondary | ICD-10-CM | POA: Diagnosis not present

## 2022-11-29 DIAGNOSIS — G894 Chronic pain syndrome: Secondary | ICD-10-CM | POA: Diagnosis not present

## 2022-11-29 DIAGNOSIS — E785 Hyperlipidemia, unspecified: Secondary | ICD-10-CM | POA: Diagnosis not present

## 2022-11-29 DIAGNOSIS — S81802D Unspecified open wound, left lower leg, subsequent encounter: Secondary | ICD-10-CM | POA: Diagnosis not present

## 2022-11-29 DIAGNOSIS — Z8546 Personal history of malignant neoplasm of prostate: Secondary | ICD-10-CM | POA: Diagnosis not present

## 2022-11-29 DIAGNOSIS — D696 Thrombocytopenia, unspecified: Secondary | ICD-10-CM | POA: Diagnosis not present

## 2022-11-30 ENCOUNTER — Ambulatory Visit: Payer: HMO | Admitting: Family

## 2022-12-04 ENCOUNTER — Ambulatory Visit: Payer: HMO | Admitting: Family

## 2022-12-04 ENCOUNTER — Telehealth: Payer: Self-pay

## 2022-12-04 NOTE — Telephone Encounter (Signed)
Patient' daughter call this afternoon to reschedule appointment  due to weather.

## 2022-12-06 DIAGNOSIS — R1310 Dysphagia, unspecified: Secondary | ICD-10-CM | POA: Diagnosis not present

## 2022-12-06 DIAGNOSIS — F32A Depression, unspecified: Secondary | ICD-10-CM | POA: Diagnosis not present

## 2022-12-06 DIAGNOSIS — I509 Heart failure, unspecified: Secondary | ICD-10-CM | POA: Diagnosis not present

## 2022-12-06 DIAGNOSIS — M1990 Unspecified osteoarthritis, unspecified site: Secondary | ICD-10-CM | POA: Diagnosis not present

## 2022-12-06 DIAGNOSIS — N189 Chronic kidney disease, unspecified: Secondary | ICD-10-CM | POA: Diagnosis not present

## 2022-12-06 DIAGNOSIS — K2100 Gastro-esophageal reflux disease with esophagitis, without bleeding: Secondary | ICD-10-CM | POA: Diagnosis not present

## 2022-12-06 DIAGNOSIS — L03116 Cellulitis of left lower limb: Secondary | ICD-10-CM | POA: Diagnosis not present

## 2022-12-06 DIAGNOSIS — Z7901 Long term (current) use of anticoagulants: Secondary | ICD-10-CM | POA: Diagnosis not present

## 2022-12-06 DIAGNOSIS — E785 Hyperlipidemia, unspecified: Secondary | ICD-10-CM | POA: Diagnosis not present

## 2022-12-06 DIAGNOSIS — M059 Rheumatoid arthritis with rheumatoid factor, unspecified: Secondary | ICD-10-CM | POA: Diagnosis not present

## 2022-12-06 DIAGNOSIS — Z96641 Presence of right artificial hip joint: Secondary | ICD-10-CM | POA: Diagnosis not present

## 2022-12-06 DIAGNOSIS — M4800 Spinal stenosis, site unspecified: Secondary | ICD-10-CM | POA: Diagnosis not present

## 2022-12-06 DIAGNOSIS — E1122 Type 2 diabetes mellitus with diabetic chronic kidney disease: Secondary | ICD-10-CM | POA: Diagnosis not present

## 2022-12-06 DIAGNOSIS — F411 Generalized anxiety disorder: Secondary | ICD-10-CM | POA: Diagnosis not present

## 2022-12-06 DIAGNOSIS — S81802D Unspecified open wound, left lower leg, subsequent encounter: Secondary | ICD-10-CM | POA: Diagnosis not present

## 2022-12-06 DIAGNOSIS — Z8546 Personal history of malignant neoplasm of prostate: Secondary | ICD-10-CM | POA: Diagnosis not present

## 2022-12-06 DIAGNOSIS — Z556 Problems related to health literacy: Secondary | ICD-10-CM | POA: Diagnosis not present

## 2022-12-06 DIAGNOSIS — G609 Hereditary and idiopathic neuropathy, unspecified: Secondary | ICD-10-CM | POA: Diagnosis not present

## 2022-12-06 DIAGNOSIS — G4700 Insomnia, unspecified: Secondary | ICD-10-CM | POA: Diagnosis not present

## 2022-12-06 DIAGNOSIS — I4891 Unspecified atrial fibrillation: Secondary | ICD-10-CM | POA: Diagnosis not present

## 2022-12-06 DIAGNOSIS — I130 Hypertensive heart and chronic kidney disease with heart failure and stage 1 through stage 4 chronic kidney disease, or unspecified chronic kidney disease: Secondary | ICD-10-CM | POA: Diagnosis not present

## 2022-12-06 DIAGNOSIS — H9190 Unspecified hearing loss, unspecified ear: Secondary | ICD-10-CM | POA: Diagnosis not present

## 2022-12-06 DIAGNOSIS — G894 Chronic pain syndrome: Secondary | ICD-10-CM | POA: Diagnosis not present

## 2022-12-06 DIAGNOSIS — D696 Thrombocytopenia, unspecified: Secondary | ICD-10-CM | POA: Diagnosis not present

## 2022-12-07 ENCOUNTER — Telehealth: Payer: Self-pay

## 2022-12-07 NOTE — Telephone Encounter (Signed)
Okay to give Home health verbal orders.  ?

## 2022-12-10 ENCOUNTER — Ambulatory Visit: Payer: HMO | Admitting: Family

## 2022-12-11 ENCOUNTER — Encounter: Payer: Self-pay | Admitting: Nurse Practitioner

## 2022-12-11 ENCOUNTER — Ambulatory Visit (INDEPENDENT_AMBULATORY_CARE_PROVIDER_SITE_OTHER): Payer: HMO | Admitting: Nurse Practitioner

## 2022-12-11 VITALS — BP 122/80 | HR 89 | Temp 97.7°F | Resp 17 | Ht 68.0 in | Wt 194.4 lb

## 2022-12-11 DIAGNOSIS — L03116 Cellulitis of left lower limb: Secondary | ICD-10-CM

## 2022-12-11 DIAGNOSIS — I4819 Other persistent atrial fibrillation: Secondary | ICD-10-CM

## 2022-12-11 DIAGNOSIS — I5022 Chronic systolic (congestive) heart failure: Secondary | ICD-10-CM | POA: Diagnosis not present

## 2022-12-11 DIAGNOSIS — R1314 Dysphagia, pharyngoesophageal phase: Secondary | ICD-10-CM | POA: Diagnosis not present

## 2022-12-11 MED ORDER — CEFADROXIL 500 MG PO CAPS: 500.0000 mg | ORAL_CAPSULE | Freq: Two times a day (BID) | ORAL | 0 refills | Status: DC

## 2022-12-11 MED ORDER — DOXYCYCLINE HYCLATE 100 MG PO TABS: 100.0000 mg | ORAL_TABLET | Freq: Two times a day (BID) | ORAL | 1 refills | Status: DC

## 2022-12-11 NOTE — Progress Notes (Signed)
Location:   [facility]   Place of Service:    Provider: Chipper Oman NP  Code Status: DNR Goals of Care:     12/11/2022    3:05 PM  Advanced Directives  Does Patient Have a Medical Advance Directive? No  Would patient like information on creating a medical advance directive? No - Patient declined     Chief Complaint  Patient presents with   Medical Management of Chronic Issues    6 month follow up. Patient states he is stil having some leg swelling   Health Maintenance    Discuss the need for foot exam and AWV    HPI: Patient is a 87 y.o. male seen today for an acute visit for left heel pain, hardly walking on it, no apparent redness, open wound, or warmth, but lateral LLE open wounds, redness, swelling, warmth noted, denied pain in the left calf when palpated or with left foot dorsiflexion.   CHF/BLE edema, using unna boot, taking Furosemide  Afib, on Eliquis  GERD, taking Pantoprazole.      Past Medical History:  Diagnosis Date   Anxiety state, unspecified    Atrial fibrillation (HCC) 03/06/2016   CHF (congestive heart failure) (HCC)    Chronic kidney disease (CKD)    Chronic pain syndrome    Constipation    Depressive disorder, not elsewhere classified    Dysphagia, unspecified(787.20)    Eye exam normal 09/01/2019   Diabetic eye exam did not reveal any retinopathy.    Herpes simplex disease    Hyperlipidemia    Hypertension    Hypopotassemia    Insomnia, unspecified    Intestinovesical fistula    Malignant neoplasm of prostate (HCC)    Myalgia and myositis, unspecified    Osteoarthritis    Pain in joint, lower leg    Bilateral knee pains   Reflux esophagitis    Rheumatoid arthritis with rheumatoid factor (HCC)    Spinal stenosis, unspecified region other than cervical    Thrombocytopenia, unspecified (HCC)    Type II or unspecified type diabetes mellitus without mention of complication, not stated as uncontrolled    Pt states that it is prediabetic    Unspecified arthropathy, pelvic region and thigh    Unspecified hereditary and idiopathic peripheral neuropathy     Past Surgical History:  Procedure Laterality Date   BIOPSY  12/01/2021   Procedure: BIOPSY;  Surgeon: Kerin Salen, MD;  Location: [facility];  Service: Gastroenterology;;   CARDIAC CATHETERIZATION  01/31/2004   Dr Allyson Sabal   CARDIOVERSION N/A 07/12/2016   Procedure: CARDIOVERSION;  Surgeon: Chrystie Nose, MD;  Location: [facility] ENDOSCOPY;  Service: Cardiovascular;  Laterality: N/A;   COLONOSCOPY  08/09/2006   Dr Claudine Mouton, hemorrhoids/rectal fistula   cystocopy  07/2006   Dr [name]   ESOPHAGOGASTRODUODENOSCOPY N/A 12/01/2021   Procedure: ESOPHAGOGASTRODUODENOSCOPY (EGD);  Surgeon: Kerin Salen, MD;  Location: [facility];  Service: Gastroenterology;  Laterality: N/A;   NASAL SINUS SURGERY  1992   PROSTATE SURGERY  1991   TOTAL HIP ARTHROPLASTY Right 02/20/2018   Procedure: RIGHT TOTAL HIP ARTHROPLASTY ANTERIOR APPROACH;  Surgeon: Kathryne Hitch, MD;  Location: [facility];  Service: Orthopedics;  Laterality: Right;    No Known Allergies  Allergies as of 12/11/2022   No Known Allergies      Medication List        Accurate as of Dec 11, 2022  4:48 PM. If you have any questions, ask your nurse or doctor.  acetaminophen 500 MG tablet Commonly known as: TYLENOL Take 1 tablet (500 mg total) by mouth every 8 (eight) hours as needed for moderate pain.   apixaban 2.5 MG Tabs tablet Commonly known as: ELIQUIS Take 1 tablet (2.5 mg total) by mouth 2 (two) times daily.   cefadroxil 500 MG capsule Commonly known as: DURICEF Take 1 capsule (500 mg total) by mouth 2 (two) times daily. Started by: Hermes Wafer X Tryston Gilliam, NP   cromolyn 4 % ophthalmic solution Commonly known as: OPTICROM Place 1 drop into both eyes 4 (four) times daily.   doxycycline 100 MG tablet Commonly known as: VIBRA-TABS Take 1 tablet (100 mg total) by mouth 2 (two) times daily. Started by: Shelbi Vaccaro  X Tavaris Eudy, NP   furosemide 40 MG tablet Commonly known as: LASIX Take 1 tablet (40 mg total) by mouth 2 (two) times daily.   Klor-Con M20 20 MEQ tablet Generic drug: potassium chloride SA TAKE 2 TABLETS BY MOUTH DAILY WHEN TAKING LASIX (FUROSMIDE)   omega-3 acid ethyl esters 1 g capsule Commonly known as: LOVAZA Take by mouth 2 (two) times daily.   pantoprazole 40 MG tablet Commonly known as: PROTONIX TAKE 1 TABLET BY MOUTH EVERY DAY   polyethylene glycol 17 g packet Commonly known as: MIRALAX / GLYCOLAX Take 17 g by mouth daily.   PRESERVISION AREDS 2 PO Take 1 tablet by mouth daily.        Review of Systems:  Review of Systems  Constitutional:  Positive for fatigue. Negative for appetite change and fever.  HENT:  Negative for congestion.   Eyes:  Negative for visual disturbance.  Respiratory:  Positive for cough and shortness of breath. Negative for chest tightness and wheezing.        DOE, occasional cough.   Cardiovascular:  Positive for leg swelling. Negative for chest pain and palpitations.  Gastrointestinal:  Negative for abdominal pain, constipation, nausea and vomiting.  Genitourinary:  Positive for penile discharge.  Musculoskeletal:  Positive for arthralgias, back pain and gait problem.       Left heel pain  Skin:  Positive for wound.  Neurological:  Negative for speech difficulty, weakness and headaches.  Psychiatric/Behavioral:  Negative for confusion and sleep disturbance. The patient is not nervous/anxious.     Health Maintenance  Topic Date Due   DTaP/Tdap/Td (1 - Tdap) Never done   FOOT EXAM  05/02/2021   Medicare Annual Wellness (AWV)  01/03/2023   OPHTHALMOLOGY EXAM  12/11/2022 (Originally 08/31/2020)   HEMOGLOBIN A1C  12/12/2022   Diabetic kidney evaluation - Urine ACR  06/14/2023   Diabetic kidney evaluation - eGFR measurement  10/17/2023   Pneumonia Vaccine 49+ Years old  Completed   HPV VACCINES  Aged Out   INFLUENZA VACCINE  Discontinued    COVID-19 Vaccine  Discontinued   Zoster Vaccines- Shingrix  Discontinued    Physical Exam: Vitals:   12/11/22 1502  BP: 122/80  Pulse: 89  Resp: 17  Temp: 97.7 F (36.5 C)  TempSrc: Temporal  SpO2: 98%  Weight: 194 lb 6.4 oz (88.2 kg)  Height: 5\' 8"  (1.727 m)   Body mass index is 29.56 kg/m. Physical Exam Constitutional:      Appearance: Normal appearance.  HENT:     Head: Normocephalic and atraumatic.     Mouth/Throat:     Mouth: Mucous membranes are moist.  Eyes:     Extraocular Movements: Extraocular movements intact.     Conjunctiva/sclera: Conjunctivae normal.     Pupils: Pupils  are equal, round, and reactive to light.  Cardiovascular:     Rate and Rhythm: Normal rate. Rhythm irregular.  Pulmonary:     Effort: Pulmonary effort is normal.     Breath sounds: No wheezing or rales.  Abdominal:     General: Bowel sounds are normal.     Palpations: Abdomen is soft.     Tenderness: There is no abdominal tenderness.  Musculoskeletal:     Cervical back: Normal range of motion and neck supple.     Right lower leg: Edema present.     Left lower leg: Edema present.     Comments: 2-3 + edema BLE  Skin:    General: Skin is warm and dry.     Findings: Erythema present.     Comments: Lateral LLE open wounds x3 areas, redness, warmth, c/o pain in the left heel.   Neurological:     General: No focal deficit present.     Mental Status: He is alert and oriented to person, place, and time. Mental status is at baseline.     Gait: Gait abnormal.  Psychiatric:        Mood and Affect: Mood normal.        Behavior: Behavior normal.        Thought Content: Thought content normal.        Judgment: Judgment normal.     Labs reviewed: Basic Metabolic Panel: Recent Labs    01/31/22 1430 03/07/22 1357 06/13/22 0946 10/17/22 0000  NA 143 143 145 143  K 3.8 3.2* 3.7 4.4  CL 104 99 106 106  CO2 22 25 29  25*  GLUCOSE 100* 106* 101*  --   BUN 17 18 19  24*  CREATININE 1.50*  2.15* 1.78* 1.7*  CALCIUM 8.5* 8.8 8.2* 8.2*  TSH  --   --  4.89*  --    Liver Function Tests: Recent Labs    06/13/22 0946 10/17/22 0000  AST 14 17  ALT 5* 10  ALKPHOS  --  65  BILITOT 0.8  --   PROT 6.6  --   ALBUMIN  --  4.1   No results for input(s): "LIPASE", "AMYLASE" in the last 8760 hours. No results for input(s): "AMMONIA" in the last 8760 hours. CBC: Recent Labs    12/26/21 1513 01/31/22 1430 06/13/22 0946 10/17/22 0000  WBC 6.5 6.9 6.2 8.3  NEUTROABS 4,004  --  3,088 4.30  HGB 8.7* 7.5* 7.8* 7.3*  HCT 28.3* 24.0* 25.0* 24*  MCV 83.0 80 89.9  --   PLT 196 225 203 237   Lipid Panel: Recent Labs    06/13/22 0946  CHOL 137  HDL 38*  LDLCALC 85  TRIG 63  CHOLHDL 3.6   Lab Results  Component Value Date   HGBA1C 6.4 (H) 06/13/2022    Procedures since last visit: No results found.  Assessment/Plan Cellulitis of left lower leg left heel pain, hardly walking on it, no apparent redness, open wound, or warmth, but lateral LLE open wounds, redness, swelling, warmth noted, denied pain in the left calf when palpated or with left foot dorsiflexion. Patient stated the last 7 day course of Doxycycline was not effective, desires 10 day doxycycline for tx  Will tx: Doxycycline 100mg  bid, adding Cefadroxil 500mg  q12hr x 10 days, X-ray left foot to r/o osteomyelitis, US venous LLE to r/o DVT  CHF (congestive heart failure) (HCC) Persisted BLE edema, DOE noted, occasional cough/phlegm production, denied PND, lungs clear, will weight daily  ac breakfast, may use extra dose Furosemide 20mg  if weight gains >2-3 Ibs  Persistent atrial fibrillation (HCC) Heart rate is controlled, continue Eliquis.   Dysphagia, pharyngoesophageal phase Stable, continue Pantoprazole.     Labs/tests ordered: X-ray left heel, venous US LLE Next appt:  01/04/2023

## 2022-12-11 NOTE — Assessment & Plan Note (Signed)
Persisted BLE edema, DOE noted, occasional cough/phlegm production, denied PND, lungs clear, will weight daily ac breakfast, may use extra dose Furosemide 20mg  if weight gains >2-3 Ibs

## 2022-12-11 NOTE — Assessment & Plan Note (Signed)
Heart rate is controlled, continue Eliquis.  

## 2022-12-11 NOTE — Assessment & Plan Note (Signed)
left heel pain, hardly walking on it, no apparent redness, open wound, or warmth, but lateral LLE open wounds, redness, swelling, warmth noted, denied pain in the left calf when palpated or with left foot dorsiflexion. Patient stated the last 7 day course of Doxycycline was not effective, desires 10 day doxycycline for tx  Will tx: Doxycycline 100mg  bid, adding Cefadroxil 500mg  q12hr x 10 days, X-ray left foot to r/o osteomyelitis, US venous LLE to r/o DVT

## 2022-12-11 NOTE — Assessment & Plan Note (Signed)
Stable, continue Pantoprazole.  

## 2022-12-13 ENCOUNTER — Telehealth: Payer: Self-pay

## 2022-12-13 DIAGNOSIS — E785 Hyperlipidemia, unspecified: Secondary | ICD-10-CM | POA: Diagnosis not present

## 2022-12-13 DIAGNOSIS — K2100 Gastro-esophageal reflux disease with esophagitis, without bleeding: Secondary | ICD-10-CM | POA: Diagnosis not present

## 2022-12-13 DIAGNOSIS — F411 Generalized anxiety disorder: Secondary | ICD-10-CM | POA: Diagnosis not present

## 2022-12-13 DIAGNOSIS — Z8546 Personal history of malignant neoplasm of prostate: Secondary | ICD-10-CM | POA: Diagnosis not present

## 2022-12-13 DIAGNOSIS — E669 Obesity, unspecified: Secondary | ICD-10-CM | POA: Diagnosis not present

## 2022-12-13 DIAGNOSIS — Z7901 Long term (current) use of anticoagulants: Secondary | ICD-10-CM | POA: Diagnosis not present

## 2022-12-13 DIAGNOSIS — E1122 Type 2 diabetes mellitus with diabetic chronic kidney disease: Secondary | ICD-10-CM | POA: Diagnosis not present

## 2022-12-13 DIAGNOSIS — N189 Chronic kidney disease, unspecified: Secondary | ICD-10-CM | POA: Diagnosis not present

## 2022-12-13 DIAGNOSIS — D509 Iron deficiency anemia, unspecified: Secondary | ICD-10-CM | POA: Diagnosis not present

## 2022-12-13 DIAGNOSIS — G4700 Insomnia, unspecified: Secondary | ICD-10-CM | POA: Diagnosis not present

## 2022-12-13 DIAGNOSIS — E1142 Type 2 diabetes mellitus with diabetic polyneuropathy: Secondary | ICD-10-CM | POA: Diagnosis not present

## 2022-12-13 DIAGNOSIS — F32A Depression, unspecified: Secondary | ICD-10-CM | POA: Diagnosis not present

## 2022-12-13 DIAGNOSIS — M4800 Spinal stenosis, site unspecified: Secondary | ICD-10-CM | POA: Diagnosis not present

## 2022-12-13 DIAGNOSIS — M059 Rheumatoid arthritis with rheumatoid factor, unspecified: Secondary | ICD-10-CM | POA: Diagnosis not present

## 2022-12-13 DIAGNOSIS — L97821 Non-pressure chronic ulcer of other part of left lower leg limited to breakdown of skin: Secondary | ICD-10-CM | POA: Diagnosis not present

## 2022-12-13 DIAGNOSIS — D696 Thrombocytopenia, unspecified: Secondary | ICD-10-CM | POA: Diagnosis not present

## 2022-12-13 DIAGNOSIS — G894 Chronic pain syndrome: Secondary | ICD-10-CM | POA: Diagnosis not present

## 2022-12-13 DIAGNOSIS — H9190 Unspecified hearing loss, unspecified ear: Secondary | ICD-10-CM | POA: Diagnosis not present

## 2022-12-13 DIAGNOSIS — E1151 Type 2 diabetes mellitus with diabetic peripheral angiopathy without gangrene: Secondary | ICD-10-CM | POA: Diagnosis not present

## 2022-12-13 DIAGNOSIS — I4819 Other persistent atrial fibrillation: Secondary | ICD-10-CM | POA: Diagnosis not present

## 2022-12-13 DIAGNOSIS — I872 Venous insufficiency (chronic) (peripheral): Secondary | ICD-10-CM | POA: Diagnosis not present

## 2022-12-13 DIAGNOSIS — I509 Heart failure, unspecified: Secondary | ICD-10-CM | POA: Diagnosis not present

## 2022-12-13 DIAGNOSIS — I130 Hypertensive heart and chronic kidney disease with heart failure and stage 1 through stage 4 chronic kidney disease, or unspecified chronic kidney disease: Secondary | ICD-10-CM | POA: Diagnosis not present

## 2022-12-13 DIAGNOSIS — G609 Hereditary and idiopathic neuropathy, unspecified: Secondary | ICD-10-CM | POA: Diagnosis not present

## 2022-12-13 NOTE — Telephone Encounter (Signed)
Incoming call received requesting verbal orders for wound care to the left leg.  Per [employer] standing order, verbal order given. Message will be sent to patient's provider as a FYI.

## 2022-12-13 NOTE — Telephone Encounter (Signed)
Noted  

## 2022-12-20 ENCOUNTER — Telehealth: Payer: Self-pay | Admitting: *Deleted

## 2022-12-20 NOTE — Telephone Encounter (Signed)
Patient niece, Jillyn Hidden, [phone] dropped off Handicap Placard to be filled out.  Form filled out and signed by [place].   Called and niece will pick up today.  Sent copy to scanning.

## 2022-12-20 NOTE — Telephone Encounter (Signed)
Ronnie Jackson, daughter called and stated that patient received his Handicap Placard renewal form in the mail and wanted to know if his niece could drop it off for [name] to sign.   Niece going to drop off form to have filled out today. To call Niece once ready. Niece will leave her telephone number on form.

## 2022-12-21 ENCOUNTER — Telehealth: Payer: HMO | Admitting: Family

## 2022-12-21 NOTE — Telephone Encounter (Signed)
Noted  

## 2022-12-21 NOTE — Telephone Encounter (Signed)
I will forward to ordering provider and PCP

## 2022-12-21 NOTE — Telephone Encounter (Signed)
FYI:  When [facility] imaging contacted pt for scheduling this is what was told to them:  12/13/22 PT FRIEND CALLED STATING THAT PT WILL CB WHEN HE IS READY TO SCHED/MG

## 2022-12-27 ENCOUNTER — Telehealth: Payer: Self-pay

## 2022-12-27 NOTE — Telephone Encounter (Signed)
[name] with [facility] called requesting verbal orders to change wrap back to unaboot with calamine wrap, gauze and coban.Left leg draining a lot.  Message sent to Richarda Blade, NP

## 2022-12-27 NOTE — Telephone Encounter (Signed)
Okay to give home health verbal orders. 

## 2022-12-28 NOTE — Telephone Encounter (Signed)
Spoke with [name] and verbal orders were given.

## 2023-01-04 ENCOUNTER — Encounter: Payer: HMO | Admitting: Family

## 2023-01-09 ENCOUNTER — Ambulatory Visit (INDEPENDENT_AMBULATORY_CARE_PROVIDER_SITE_OTHER): Payer: HMO | Admitting: Adult Health

## 2023-01-09 DIAGNOSIS — Z0000 Encounter for general adult medical examination without abnormal findings: Secondary | ICD-10-CM | POA: Diagnosis not present

## 2023-01-09 NOTE — Patient Instructions (Signed)
  Ronnie Jackson , Thank you for taking time to come for your Medicare Wellness Visit. I appreciate your ongoing commitment to your health goals. Please review the following plan we discussed and let me know if I can assist you in the future.   These are the goals we discussed:  Goals      DIET - EAT MORE FRUITS AND VEGETABLES     Eat a lot of pinto beans, mandarin, bananas , strawberries and blueberries, potatoes     pat     I want to get to feeling better      Patient Stated     Pain controlled          This is a list of the screening recommended for you and due dates:  Health Maintenance  Topic Date Due   DTaP/Tdap/Td vaccine (1 - Tdap) Never done   Eye exam for diabetics  08/31/2020   Complete foot exam   05/02/2021   Hemoglobin A1C  12/12/2022   Yearly kidney health urinalysis for diabetes  06/14/2023   Yearly kidney function blood test for diabetes  10/17/2023   Medicare Annual Wellness Visit  01/09/2024   Pneumonia Vaccine  Completed   HPV Vaccine  Aged Out   Flu Shot  Discontinued   COVID-19 Vaccine  Discontinued   Zoster (Shingles) Vaccine  Discontinued

## 2023-01-09 NOTE — Progress Notes (Signed)
Subjective:   Ronnie Jackson is a 87 y.o. male who presents for Medicare Annual/Subsequent preventive examination.  Visit Complete: Virtual  I connected with  Ronnie Jackson on 01/09/23 by a video and audio enabled telemedicine application and verified that I am speaking with the correct person using two identifiers.  Patient Location: Home  Provider Location: Office/Clinic  I discussed the limitations of evaluation and management by telemedicine. The patient expressed understanding and agreed to proceed.  Patient Medicare AWV questionnaire was completed by the patient on 01/09/23; I have confirmed that all information answered by patient is correct and no changes since this date.  Review of Systems     Cardiac Risk Factors include: advanced age (>75men, >16 women);diabetes mellitus;dyslipidemia;male gender;sedentary lifestyle     Objective:    Today's Vitals   01/09/23 1551  PainSc: 3    There is no height or weight on file to calculate BMI.     01/09/2023    3:35 PM 12/11/2022    3:05 PM 06/07/2022    1:57 PM 01/02/2022    1:26 PM 01/02/2022   12:37 PM 12/26/2021   11:19 AM 12/05/2021    3:08 PM  Advanced Directives  Does Patient Have a Medical Advance Directive? No No No No No No No  Does patient want to make changes to medical advance directive?   No - Patient declined No - Patient declined No - Patient declined No - Patient declined No - Patient declined  Would patient like information on creating a medical advance directive? No - Patient declined No - Patient declined   No - Patient declined      Current Medications (verified) Outpatient Encounter Medications as of 01/09/2023  Medication Sig   acetaminophen (TYLENOL) 500 MG tablet Take 1 tablet (500 mg total) by mouth every 8 (eight) hours as needed for moderate pain.   apixaban (ELIQUIS) 2.5 MG TABS tablet Take 1 tablet (2.5 mg total) by mouth 2 (two) times daily.   cefadroxil (DURICEF) 500 MG capsule Take 1 capsule  (500 mg total) by mouth 2 (two) times daily.   cromolyn (OPTICROM) 4 % ophthalmic solution Place 1 drop into both eyes 4 (four) times daily.   doxycycline (VIBRA-TABS) 100 MG tablet Take 1 tablet (100 mg total) by mouth 2 (two) times daily.   furosemide (LASIX) 40 MG tablet Take 1 tablet (40 mg total) by mouth 2 (two) times daily.   KLOR-CON M20 20 MEQ tablet TAKE 2 TABLETS BY MOUTH DAILY WHEN TAKING LASIX (FUROSMIDE)   Multiple Vitamins-Minerals (PRESERVISION AREDS 2 PO) Take 1 tablet by mouth daily.   omega-3 acid ethyl esters (LOVAZA) 1 g capsule Take by mouth 2 (two) times daily.   pantoprazole (PROTONIX) 40 MG tablet TAKE 1 TABLET BY MOUTH EVERY DAY   polyethylene glycol (MIRALAX / GLYCOLAX) 17 g packet Take 17 g by mouth daily.   No facility-administered encounter medications on file as of 01/09/2023.    Allergies (verified) Patient has no known allergies.   History: Past Medical History:  Diagnosis Date   Anxiety state, unspecified    Atrial fibrillation (HCC) 03/06/2016   CHF (congestive heart failure) (HCC)    Chronic kidney disease (CKD)    Chronic pain syndrome    Constipation    Depressive disorder, not elsewhere classified    Dysphagia, unspecified(787.20)    Eye exam normal 09/01/2019   Diabetic eye exam did not reveal any retinopathy.    Herpes simplex disease  Hyperlipidemia    Hypertension    Hypopotassemia    Insomnia, unspecified    Intestinovesical fistula    Malignant neoplasm of prostate (HCC)    Myalgia and myositis, unspecified    Osteoarthritis    Pain in joint, lower leg    Bilateral knee pains   Reflux esophagitis    Rheumatoid arthritis with rheumatoid factor (HCC)    Spinal stenosis, unspecified region other than cervical    Thrombocytopenia, unspecified (HCC)    Type II or unspecified type diabetes mellitus without mention of complication, not stated as uncontrolled    Pt states that it is prediabetic   Unspecified arthropathy, pelvic  region and thigh    Unspecified hereditary and idiopathic peripheral neuropathy    Past Surgical History:  Procedure Laterality Date   BIOPSY  12/01/2021   Procedure: BIOPSY;  Surgeon: Kerin Salen, MD;  Location: [facility];  Service: Gastroenterology;;   CARDIAC CATHETERIZATION  01/31/2004   Dr Allyson Sabal   CARDIOVERSION N/A 07/12/2016   Procedure: CARDIOVERSION;  Surgeon: Chrystie Nose, MD;  Location: [facility];  Service: Cardiovascular;  Laterality: N/A;   COLONOSCOPY  08/09/2006   Dr Claudine Mouton, hemorrhoids/rectal fistula   cystocopy  07/2006   Dr [name]   ESOPHAGOGASTRODUODENOSCOPY N/A 12/01/2021   Procedure: ESOPHAGOGASTRODUODENOSCOPY (EGD);  Surgeon: Kerin Salen, MD;  Location: [facility];  Service: Gastroenterology;  Laterality: N/A;   NASAL SINUS SURGERY  1992   PROSTATE SURGERY  1991   TOTAL HIP ARTHROPLASTY Right 02/20/2018   Procedure: RIGHT TOTAL HIP ARTHROPLASTY ANTERIOR APPROACH;  Surgeon: Kathryne Hitch, MD;  Location: [facility];  Service: Orthopedics;  Laterality: Right;   Family History  Problem Relation Age of Onset   Prostate cancer Father    Heart failure Mother    Lung cancer Daughter    Arthritis Sister    Social History   Socioeconomic History   Marital status: Widowed    Spouse name: Ishaq Batis   Number of children: 1   Years of education: 12   Highest education level: 12th grade  Occupational History   Occupation: Retired  Tobacco Use   Smoking status: Never    Passive exposure: Never   Smokeless tobacco: Current    Types: Chew   Tobacco comments:    Chews tobacco daily   Vaping Use   Vaping Use: Never used  Substance and Sexual Activity   Alcohol use: No   Drug use: No   Sexual activity: Not Currently  Other Topics Concern   Not on file  Social History Narrative   Not on file   Social Determinants of Health   Financial Resource Strain: Low Risk  (11/05/2019)   Overall Financial Resource Strain (CARDIA)    Difficulty of  Paying Living Expenses: Not hard at all  Food Insecurity: No Food Insecurity (11/05/2019)   Hunger Vital Sign    Worried About Running Out of Food in the Last Year: Never true    Ran Out of Food in the Last Year: Never true  Transportation Needs: No Transportation Needs (11/05/2019)   PRAPARE - Administrator, Civil Service (Medical): No    Lack of Transportation (Non-Medical): No  Physical Activity: Inactive (11/05/2019)   Exercise Vital Sign    Days of Exercise per Week: 0 days    Minutes of Exercise per Session: 0 min  Stress: Stress Concern Present (11/05/2019)   [employer] of Occupational Health - Occupational Stress Questionnaire    Feeling of Stress :  To some extent  Social Connections: Moderately Integrated (11/05/2019)   Social Connection and Isolation Panel [NHANES]    Frequency of Communication with Friends and Family: More than three times a week    Frequency of Social Gatherings with Friends and Family: More than three times a week    Attends Religious Services: More than 4 times per year    Active Member of [employer] or Organizations: No    Attends Engineer, structural: Never    Marital Status: Married    Tobacco Counseling Ready to quit: Not Answered Counseling given: Not Answered Tobacco comments: Chews tobacco daily    Clinical Intake:  Pre-visit preparation completed: No  Pain : 0-10 Pain Score: 3  Pain Type: Chronic pain Pain Location: Back Pain Orientation: Lower Pain Descriptors / Indicators: Throbbing Pain Onset: In the past 7 days Pain Frequency: Occasional Pain Relieving Factors: takes Tylenol or Ibuprofen PRN Effect of Pain on Daily Activities: barely does anything due to lower extremity edema  Pain Relieving Factors: takes Tylenol or Ibuprofen PRN  BMI - recorded: 29.56 Nutritional Status: BMI 25 -29 Overweight Nutritional Risks: None Diabetes: Yes CBG done?: No Did pt. bring in CBG monitor from home?: No  How often  do you need to have someone help you when you read instructions, pamphlets, or other written materials from your doctor or pharmacy?: 1 - Never What is the last grade level you completed in school?: [employer] school  Interpreter Needed?: No  Information entered by :: Avalie Oconnor Medina-Vargas DNP   Activities of Daily Living    01/09/2023    3:37 PM  In your present state of health, do you have any difficulty performing the following activities:  Hearing? 0  Vision? 0  Difficulty concentrating or making decisions? 0  Walking or climbing stairs? 0  Dressing or bathing? 0  Doing errands, shopping? 0  Preparing Food and eating ? N  Using the Toilet? N  In the past six months, have you accidently leaked urine? N  Do you have problems with loss of bowel control? N  Managing your Medications? N  Managing your Finances? N  Housekeeping or managing your Housekeeping? Y    Patient Care Team: Ngetich, [name], NP as PCP - General (Family Medicine) [name], MD as PCP - Cardiology (Cardiology) [name], NP as Nurse Practitioner (Cardiology) [name], MD as Consulting Physician (Pulmonary Disease) [name], MD as Consulting Physician (Neurosurgery) [name], MD as Referring Physician (Surgery) [name], [name], MD (General Surgery) [name], MD as Consulting Physician (Rheumatology) [name], MD as Consulting Physician (Ophthalmology)  Indicate any recent Medical Services you may have received from other than [facility] providers in the past year (date may be approximate).     Assessment:   This is a routine wellness examination for Jose.  Hearing/Vision screen No results found.  Dietary issues and exercise activities discussed:     Goals Addressed             This Visit's Progress    DIET - EAT MORE FRUITS AND VEGETABLES       Eat a lot of pinto beans, mandarin, bananas , strawberries and blueberries, potatoes        Depression Screen    01/09/2023    3:36 PM 12/11/2022    3:04 PM 01/02/2022    1:24 PM 01/04/2021   10:34 AM 05/02/2020   11:19 AM 05/02/2020   10:59 AM 11/27/2019  1:59 PM  PHQ 2/9 Scores  PHQ - 2 Score 0 0 0 0 0 0 0    Fall Risk    01/09/2023    3:36 PM 12/11/2022    3:04 PM 06/07/2022    1:57 PM 01/02/2022   12:37 PM 12/26/2021    2:37 PM  Fall Risk   Falls in the past year? 0 0 0 0 0  Number falls in past yr: 0 0 0 0 0  Injury with Fall? 0 0 0 0 0  Risk for fall due to : No Fall Risks No Fall Risks No Fall Risks  No Fall Risks  Follow up  Falls evaluation completed Falls evaluation completed  Falls evaluation completed    MEDICARE RISK AT HOME:   TIMED UP AND GO:  Was the test performed?  No    Cognitive Function:    01/04/2021   11:10 AM 11/20/2017    1:14 PM 11/15/2016   10:29 AM  MMSE - Mini Mental State Exam  Not completed: Refused    Orientation to time  3 4  Orientation to Place  5 4  Registration  3 3  Attention/ Calculation  5 5  Recall  2 2  Language- name 2 objects  2 2  Language- repeat  1 1  Language- follow 3 step command  3 3  Language- read & follow direction  1 1  Write a sentence  1 1  Copy design  1 1  Total score  27 27        01/09/2023    3:38 PM 01/02/2022    1:25 PM 11/27/2019    2:00 PM 11/25/2018    1:10 PM  6CIT Screen  What Year? 0 points 0 points 0 points 0 points  What month? 0 points 0 points 0 points 0 points  What time? 0 points 0 points 0 points 0 points  Count back from 20 0 points 0 points 0 points 0 points  Months in reverse 0 points 4 points 0 points 4 points  Repeat phrase 0 points 0 points 10 points 4 points  Total Score 0 points 4 points 10 points 8 points    Immunizations Immunization History  Administered Date(s) Administered   Fluad Quad(high Dose 65+) 10/19/2019   Influenza, High Dose Seasonal PF 04/18/2017, 05/12/2018   Influenza,inj,Quad PF,6+ Mos 06/21/2015   Pneumococcal Conjugate-13 06/21/2015    Pneumococcal Polysaccharide-23 11/15/2016    TDAP status: Due, Education has been provided regarding the importance of this vaccine. Advised may receive this vaccine at local pharmacy or Health Dept. Aware to provide a copy of the vaccination record if obtained from local pharmacy or Health Dept. Verbalized acceptance and understanding.  Flu Vaccine status: Declined, Education has been provided regarding the importance of this vaccine but patient still declined. Advised may receive this vaccine at local pharmacy or Health Dept. Aware to provide a copy of the vaccination record if obtained from local pharmacy or Health Dept. Verbalized acceptance and understanding.  Pneumococcal vaccine status: Up to date  Covid-19 vaccine status: Declined, Education has been provided regarding the importance of this vaccine but patient still declined. Advised may receive this vaccine at local pharmacy or Health Dept.or vaccine clinic. Aware to provide a copy of the vaccination record if obtained from local pharmacy or Health Dept. Verbalized acceptance and understanding.  Qualifies for Shingles Vaccine? Yes   Zostavax completed No   Shingrix Completed?: No.    Education has been  provided regarding the importance of this vaccine. Patient has been advised to call insurance company to determine out of pocket expense if they have not yet received this vaccine. Advised may also receive vaccine at local pharmacy or Health Dept. Verbalized acceptance and understanding.  Screening Tests Health Maintenance  Topic Date Due   DTaP/Tdap/Td (1 - Tdap) Never done   OPHTHALMOLOGY EXAM  08/31/2020   FOOT EXAM  05/02/2021   HEMOGLOBIN A1C  12/12/2022   Diabetic kidney evaluation - Urine ACR  06/14/2023   Diabetic kidney evaluation - eGFR measurement  10/17/2023   Medicare Annual Wellness (AWV)  01/09/2024   Pneumonia Vaccine 57+ Years old  Completed   HPV VACCINES  Aged Out   INFLUENZA VACCINE  Discontinued   COVID-19  Vaccine  Discontinued   Zoster Vaccines- Shingrix  Discontinued    Health Maintenance  Health Maintenance Due  Topic Date Due   DTaP/Tdap/Td (1 - Tdap) Never done   OPHTHALMOLOGY EXAM  08/31/2020   FOOT EXAM  05/02/2021   HEMOGLOBIN A1C  12/12/2022    Colorectal cancer screening: No longer required.   Lung Cancer Screening: (Low Dose CT Chest recommended if Age 70-80 years, 20 pack-year currently smoking OR have quit w/in 15years.) does not qualify.   Lung Cancer Screening Referral: No  Additional Screening:  Hepatitis C Screening: does not qualify; Completed No  Vision Screening: Recommended annual ophthalmology exams for early detection of glaucoma and other disorders of the eye. Is the patient up to date with their annual eye exam?  Yes  Who is the provider or what is the name of the office in which the patient attends annual eye exams? Dr. Dione Booze If pt is not established with a provider, would they like to be referred to a provider to establish care? No .   Dental Screening: Recommended annual dental exams for proper oral hygiene  Diabetic Foot Exam: Diabetic Foot Exam: Overdue, Pt has been advised about the importance in completing this exam. Pt is scheduled for diabetic foot exam on will schedule when leg is better.  Community Resource Referral / Chronic Care Management: CRR required this visit?  No   CCM required this visit?  No     Plan:     I have personally reviewed and noted the following in the patient's chart:   Medical and social history Use of alcohol, tobacco or illicit drugs  Current medications and supplements including opioid prescriptions. Patient is not currently taking opioid prescriptions. Functional ability and status Nutritional status Physical activity Advanced directives List of other physicians Hospitalizations, surgeries, and ER visits in previous 12 months Vitals Screenings to include cognitive, depression, and falls Referrals and  appointments  In addition, I have reviewed and discussed with patient certain preventive protocols, quality metrics, and best practice recommendations. A written personalized care plan for preventive services as well as general preventive health recommendations were provided to patient.     Marqus Macphee Medina-Vargas, NP   01/09/2023   After Visit Summary: (Mail) Due to this being a telephonic visit, the after visit summary with patients personalized plan was offered to patient via mail   Nurse Notes: will have diabetic foot exam done on next visit.

## 2023-01-18 ENCOUNTER — Telehealth: Payer: Self-pay

## 2023-01-18 NOTE — Telephone Encounter (Signed)
Era Bumpers, Nurse with [facility] called stating patient is having a hard time tolerating unna boot wrap and wound is not healed. She is requesting verbal orders to start using comprecares on patient which consisit of compression socks and velcro placed on top twice a week until end of episode.  Message sent to Abbey Chatters, NP

## 2023-01-18 NOTE — Telephone Encounter (Signed)
I spoke with the daughter to schedule an appointment to see Mast, Man X, NP in the office today and just waiting on her to call me back.

## 2023-01-18 NOTE — Telephone Encounter (Signed)
I spoke with Ronnie Jackson. Patient is not seeing wound care clinic. Patient has had this wound since May. Patient was seen in March for leg wound,but it is the same leg not the same area. Era Bumpers states that the wound seems to be improved because it is not as deep, it is still weeping  because of the leg swelling. She states that patient doesn't like to take diuretic,but does take it.   Message sent to Abbey Chatters, NP

## 2023-01-18 NOTE — Telephone Encounter (Signed)
Is he seeing the wound care clinic? How long has he had this wound?

## 2023-01-18 NOTE — Telephone Encounter (Signed)
Patient called back to let me know that patient will not be able to come in the office but has upcoming appointment July 16 st with Ngetich, [name], NP

## 2023-01-25 ENCOUNTER — Telehealth: Payer: Self-pay

## 2023-01-25 NOTE — Telephone Encounter (Signed)
Please schedule appointment for evaluation on wound on the leg and anxiety.

## 2023-01-25 NOTE — Telephone Encounter (Signed)
[name] from [facility] called and states patient has been refusing visits because of truck issues. Patient has been frustrated and having anxiety. She states that his leg wound is healing but moving to different locations. She states that he's not able to keep swelling down, and he complains that wrap hurts his legs. She states that she recommended patient to see cardiologist but he wont make appointment and keeps procrastinating. Message sent to PCP Ngetich, [name], NP

## 2023-01-29 NOTE — Telephone Encounter (Signed)
Patient has upcoming appointment July 16th, 2024.

## 2023-01-29 NOTE — Telephone Encounter (Signed)
Noted  

## 2023-02-05 ENCOUNTER — Ambulatory Visit: Payer: HMO | Admitting: Family

## 2023-03-14 ENCOUNTER — Telehealth: Payer: HMO

## 2023-03-14 NOTE — Telephone Encounter (Signed)
[name] with [facility] called and left message on clinical intake voicemail requesting verbal orders to draw a CBC and CMP due to patient complaining of feeling very weak and wanting his hemoglobin checked. She also stated that patient's wound has closed.  Message sent to Richarda Blade, NP

## 2023-03-14 NOTE — Telephone Encounter (Signed)
Tried [employer] and no answer. Unable to leave message due to mailbox being full.

## 2023-03-14 NOTE — Telephone Encounter (Signed)
Okay to give [facility] verbal orders for labs.

## 2023-03-18 NOTE — Telephone Encounter (Signed)
Spoke with [name] and verbal orders given for blood work.

## 2023-03-21 DIAGNOSIS — Z7689 Persons encountering health services in other specified circumstances: Secondary | ICD-10-CM | POA: Diagnosis not present

## 2023-03-22 LAB — CBC AND DIFFERENTIAL
HCT: 20 — AB (ref 41–53)
Hemoglobin: 6.1 — AB (ref 13.5–17.5)
Neutrophils Absolute: 4.2
Platelets: 187 10*3/uL (ref 150–400)
WBC: 7.9

## 2023-03-22 LAB — BASIC METABOLIC PANEL
BUN: 18 (ref 4–21)
CO2: 23 — AB (ref 13–22)
Chloride: 103 (ref 99–108)
Creatinine: 1.4 — AB (ref 0.6–1.3)
Glucose: 133
Potassium: 3.3 mEq/L — AB (ref 3.5–5.1)
Sodium: 141 (ref 137–147)

## 2023-03-22 LAB — CBC: RBC: 2.47 — AB (ref 3.87–5.11)

## 2023-03-22 LAB — HEPATIC FUNCTION PANEL
ALT: 8 U/L — AB (ref 10–40)
AST: 15 (ref 14–40)
Alkaline Phosphatase: 74 (ref 25–125)
Bilirubin, Total: 0.3

## 2023-03-22 LAB — COMPREHENSIVE METABOLIC PANEL
Albumin: 3.4 — AB (ref 3.5–5.0)
Calcium: 7.9 — AB (ref 8.7–10.7)
Globulin: 2.4
eGFR: 51

## 2023-03-25 ENCOUNTER — Other Ambulatory Visit: Payer: Self-pay

## 2023-03-25 ENCOUNTER — Telehealth: Payer: Self-pay | Admitting: Family

## 2023-03-25 ENCOUNTER — Encounter (HOSPITAL_COMMUNITY): Payer: Self-pay

## 2023-03-25 ENCOUNTER — Emergency Department (HOSPITAL_COMMUNITY)
Admission: EM | Admit: 2023-03-25 | Discharge: 2023-03-25 | Disposition: A | Discharge status: Still In Hospital | Payer: PPO | Attending: Emergency Medicine | Admitting: Emergency Medicine

## 2023-03-25 DIAGNOSIS — E119 Type 2 diabetes mellitus without complications: Secondary | ICD-10-CM | POA: Insufficient documentation

## 2023-03-25 DIAGNOSIS — I4891 Unspecified atrial fibrillation: Secondary | ICD-10-CM | POA: Diagnosis not present

## 2023-03-25 DIAGNOSIS — E876 Hypokalemia: Secondary | ICD-10-CM | POA: Insufficient documentation

## 2023-03-25 DIAGNOSIS — I5020 Unspecified systolic (congestive) heart failure: Secondary | ICD-10-CM | POA: Insufficient documentation

## 2023-03-25 DIAGNOSIS — R195 Other fecal abnormalities: Secondary | ICD-10-CM | POA: Diagnosis not present

## 2023-03-25 DIAGNOSIS — I509 Heart failure, unspecified: Secondary | ICD-10-CM | POA: Diagnosis not present

## 2023-03-25 DIAGNOSIS — D62 Acute posthemorrhagic anemia: Secondary | ICD-10-CM | POA: Diagnosis not present

## 2023-03-25 DIAGNOSIS — D649 Anemia, unspecified: Secondary | ICD-10-CM | POA: Insufficient documentation

## 2023-03-25 DIAGNOSIS — Z8546 Personal history of malignant neoplasm of prostate: Secondary | ICD-10-CM | POA: Insufficient documentation

## 2023-03-25 DIAGNOSIS — Z7901 Long term (current) use of anticoagulants: Secondary | ICD-10-CM | POA: Insufficient documentation

## 2023-03-25 DIAGNOSIS — R799 Abnormal finding of blood chemistry, unspecified: Secondary | ICD-10-CM | POA: Insufficient documentation

## 2023-03-25 DIAGNOSIS — I5022 Chronic systolic (congestive) heart failure: Secondary | ICD-10-CM

## 2023-03-25 DIAGNOSIS — K921 Melena: Secondary | ICD-10-CM | POA: Diagnosis not present

## 2023-03-25 LAB — BASIC METABOLIC PANEL
Anion gap: 11 (ref 5–15)
BUN: 19 mg/dL (ref 8–23)
CO2: 26 mmol/L (ref 22–32)
Calcium: 7.8 mg/dL — ABNORMAL LOW (ref 8.9–10.3)
Chloride: 102 mmol/L (ref 98–111)
Creatinine, Ser: 1.73 mg/dL — ABNORMAL HIGH (ref 0.61–1.24)
GFR, Estimated: 38 mL/min — ABNORMAL LOW (ref >60)
Glucose, Bld: 102 mg/dL — ABNORMAL HIGH (ref 70–99)
Potassium: 2.8 mmol/L — ABNORMAL LOW (ref 3.5–5.1)
Sodium: 139 mmol/L (ref 135–145)

## 2023-03-25 LAB — CBC WITH DIFFERENTIAL/PLATELET
Abs Immature Granulocytes: 0.02 10*3/uL (ref 0.00–0.07)
Basophils Absolute: 0 10*3/uL (ref 0.0–0.1)
Basophils Relative: 1 %
Eosinophils Absolute: 0.2 10*3/uL (ref 0.0–0.5)
Eosinophils Relative: 2 %
HCT: 21.3 % — ABNORMAL LOW (ref 39.0–52.0)
Hemoglobin: 6.5 g/dL — CL (ref 13.0–17.0)
Immature Granulocytes: 0 %
Lymphocytes Relative: 30 %
Lymphs Abs: 2.6 10*3/uL (ref 0.7–4.0)
MCH: 25.3 pg — ABNORMAL LOW (ref 26.0–34.0)
MCHC: 30.5 g/dL (ref 30.0–36.0)
MCV: 82.9 fL (ref 80.0–100.0)
Monocytes Absolute: 0.6 10*3/uL (ref 0.1–1.0)
Monocytes Relative: 7 %
Neutro Abs: 5.1 10*3/uL (ref 1.7–7.7)
Neutrophils Relative %: 60 %
Platelets: 192 10*3/uL (ref 150–400)
RBC: 2.57 MIL/uL — ABNORMAL LOW (ref 4.22–5.81)
RDW: 19.5 % — ABNORMAL HIGH (ref 11.5–15.5)
WBC: 8.5 10*3/uL (ref 4.0–10.5)
nRBC: 0 % (ref 0.0–0.2)

## 2023-03-25 LAB — IRON AND TIBC
Iron: 24 ug/dL — ABNORMAL LOW (ref 45–182)
Saturation Ratios: 7 % — ABNORMAL LOW (ref 17.9–39.5)
TIBC: 371 ug/dL (ref 250–450)
UIBC: 347 ug/dL

## 2023-03-25 LAB — POC OCCULT BLOOD, ED: Fecal Occult Bld: POSITIVE — AB

## 2023-03-25 LAB — FERRITIN: Ferritin: 5 ng/mL — ABNORMAL LOW (ref 24–336)

## 2023-03-25 LAB — PREPARE RBC (CROSSMATCH)

## 2023-03-25 MED ORDER — ACETAMINOPHEN 325 MG PO TABS: 650.0000 mg | ORAL_TABLET | Freq: Four times a day (QID) | ORAL | Status: DC | PRN

## 2023-03-25 MED ORDER — ONDANSETRON HCL 4 MG/2ML IJ SOLN: 4.0000 mg | Freq: Four times a day (QID) | INTRAMUSCULAR | Status: DC | PRN

## 2023-03-25 MED ORDER — POTASSIUM CHLORIDE CRYS ER 20 MEQ PO TBCR
40.0000 meq | EXTENDED_RELEASE_TABLET | ORAL | Status: DC
  Administered 2023-03-25: 40 meq via ORAL
  Filled 2023-03-25: qty 2

## 2023-03-25 MED ORDER — ONDANSETRON HCL 4 MG PO TABS: 4.0000 mg | ORAL_TABLET | Freq: Four times a day (QID) | ORAL | Status: DC | PRN

## 2023-03-25 MED ORDER — SODIUM CHLORIDE 0.9% IV SOLUTION: Freq: Once | INTRAVENOUS | Status: DC

## 2023-03-25 MED ORDER — ACETAMINOPHEN 650 MG RE SUPP: 650.0000 mg | Freq: Four times a day (QID) | RECTAL | Status: DC | PRN

## 2023-03-25 MED ORDER — SODIUM CHLORIDE 0.9% FLUSH: 3.0000 mL | Freq: Two times a day (BID) | INTRAVENOUS | Status: DC

## 2023-03-25 MED ORDER — FUROSEMIDE 10 MG/ML IJ SOLN
40.0000 mg | Freq: Once | INTRAMUSCULAR | Status: AC
  Administered 2023-03-25: 40 mg via INTRAVENOUS
  Filled 2023-03-25: qty 4

## 2023-03-25 NOTE — ED Provider Notes (Signed)
EMERGENCY DEPARTMENT AT [facility] Provider Note   CSN: [id number] Arrival date & time: 03/25/23  1248     History  Chief Complaint  Patient presents with   Abnormal Lab    Ronnie Jackson is a 87 y.o. male history of A-fib on Eliquis, status post prostate ectomy due to prostate cancer, iron deficiency anemia, chronic cellulitis of left lower leg, AAA, type 2 diabetes with CHF presented for abnormal lab.  Patient went to his primary care provider for annual checkup and was noted to have a hemoglobin of 6.1.  Family notes that about a month ago patient did have rectal bleeding after using an enema incorrectly but states that this has resolved.  Patient notes that he is supposed to be on iron supplements but does not take these as he does not like how they make him feel.  Patient denies chest pain, shortness of breath but does endorse increased fatigue.  GI specialist: Marca Ancona, MD  Patient denied hematemesis, nausea/vomiting, melena, hematochezia currently  Home Medications Prior to Admission medications   Medication Sig Start Date End Date Taking? Authorizing Provider  acetaminophen (TYLENOL) 500 MG tablet Take 1 tablet (500 mg total) by mouth every 8 (eight) hours as needed for moderate pain. 12/26/21   Ngetich, [name] C, NP  apixaban (ELIQUIS) 2.5 MG TABS tablet Take 1 tablet (2.5 mg total) by mouth 2 (two) times daily. 05/07/22   Ngetich, [name] C, NP  cefadroxil (DURICEF) 500 MG capsule Take 1 capsule (500 mg total) by mouth 2 (two) times daily. 12/11/22   Mast, Man X, NP  cromolyn (OPTICROM) 4 % ophthalmic solution Place 1 drop into both eyes 4 (four) times daily. 11/25/18   Ngetich, [name] C, NP  doxycycline (VIBRA-TABS) 100 MG tablet Take 1 tablet (100 mg total) by mouth 2 (two) times daily. 12/11/22   Mast, Man X, NP  furosemide (LASIX) 40 MG tablet Take 1 tablet (40 mg total) by mouth 2 (two) times daily. 05/07/22   Ngetich, [name] C, NP  KLOR-CON M20 20 MEQ tablet TAKE  2 TABLETS BY MOUTH DAILY WHEN TAKING LASIX (FUROSMIDE) 05/14/22   Ngetich, [name] C, NP  Multiple Vitamins-Minerals (PRESERVISION AREDS 2 PO) Take 1 tablet by mouth daily.    [provider]  omega-3 acid ethyl esters (LOVAZA) 1 g capsule Take by mouth 2 (two) times daily.    [provider]  pantoprazole (PROTONIX) 40 MG tablet TAKE 1 TABLET BY MOUTH EVERY DAY 05/01/22   Ngetich, [name] C, NP  polyethylene glycol (MIRALAX / GLYCOLAX) 17 g packet Take 17 g by mouth daily. 11/28/19   Dietrich Pates, PA-C      Allergies    Patient has no known allergies.    Review of Systems   Review of Systems  Physical Exam Updated Vital Signs BP 120/81 (BP Location: Left Arm)   Pulse 91   Temp 98.3 F (36.8 C) (Oral)   Resp 20   Ht 5\' 9"  (1.753 m)   Wt 81.6 kg   SpO2 96%   BMI 26.58 kg/m  Physical Exam Constitutional:      General: He is not in acute distress. Eyes:     Extraocular Movements: Extraocular movements intact.     Conjunctiva/sclera: Conjunctivae normal.     Pupils: Pupils are equal, round, and reactive to light.  Cardiovascular:     Rate and Rhythm: Normal rate and regular rhythm.     Pulses: Normal pulses.  Heart sounds: Normal heart sounds.  Pulmonary:     Effort: Pulmonary effort is normal.     Breath sounds: Normal breath sounds.  Abdominal:     General: There is no distension.     Palpations: Abdomen is soft.     Tenderness: There is no abdominal tenderness. There is no guarding or rebound.  Genitourinary:    Comments: Chaperone: Shaw, RN No rectal abnormalities noted No obvious bleeding noted No hemorrhoids noted Rectal tone intact Musculoskeletal:        General: Normal range of motion.  Skin:    General: Skin is warm and dry.     Capillary Refill: Capillary refill takes less than 2 seconds.     Coloration: Skin is not pale.  Neurological:     Mental Status: He is alert and oriented to person, place, and time.  Psychiatric:        Mood and  Affect: Mood normal.     ED Results / Procedures / Treatments   Labs (all labs ordered are listed, but only abnormal results are displayed) Labs Reviewed  BASIC METABOLIC PANEL - Abnormal; Notable for the following components:      Result Value   Potassium 2.8 (*)    Glucose, Bld 102 (*)    Creatinine, Ser 1.73 (*)    Calcium 7.8 (*)    GFR, Estimated 38 (*)    All other components within normal limits  CBC WITH DIFFERENTIAL/PLATELET - Abnormal; Notable for the following components:   RBC 2.57 (*)    Hemoglobin 6.5 (*)    HCT 21.3 (*)    MCH 25.3 (*)    RDW 19.5 (*)    All other components within normal limits  IRON AND TIBC - Abnormal; Notable for the following components:   Iron 24 (*)    Saturation Ratios 7 (*)    All other components within normal limits  FERRITIN - Abnormal; Notable for the following components:   Ferritin 5 (*)    All other components within normal limits  POC OCCULT BLOOD, ED - Abnormal; Notable for the following components:   Fecal Occult Bld POSITIVE (*)    All other components within normal limits  TYPE AND SCREEN  PREPARE RBC (CROSSMATCH)    EKG None  Radiology No results found.  Procedures .Critical Care  Performed by: Netta Corrigan, PA-C Authorized by: Netta Corrigan, PA-C   Critical care provider statement:    Critical care time (minutes):  30   Critical care was necessary to treat or prevent imminent or life-threatening deterioration of the following conditions: Anemia.   Critical care was time spent personally by me on the following activities:  Blood draw for specimens, development of treatment plan with patient or surrogate, discussions with consultants, evaluation of patient's response to treatment, examination of patient, obtaining history from patient or surrogate, review of old charts, re-evaluation of patient's condition, pulse oximetry, ordering and review of radiographic studies, ordering and review of laboratory  studies and ordering and performing treatments and interventions   I assumed direction of critical care for this patient from another provider in my specialty: no     Care discussed with: admitting provider       Medications Ordered in ED Medications  0.9 %  sodium chloride infusion (Manually program via Guardrails IV Fluids) (has no administration in time range)    ED Course/ Medical Decision Making/ A&P  Medical Decision Making Amount and/or Complexity of Data Reviewed Labs: ordered.   [name] 87 y.o. presented today for abnormal lab. Working DDx that I considered at this time includes, but not limited to, symptomatic anemia, intra-abdominal hemorrhage, GI bleed, electrolyte abnormalities.  R/o DDx: Intra-abdominal hemorrhage: These are considered less likely due to history of present illness, physical exam, labs/imaging findings  Review of prior external notes: 11/29/2021 discharge summary  Unique Tests and My Interpretation:  CBC: Anemia 6.5 BMP: Hypokalemia 2.8 Ferritin: Pending Iron and TIBC: Pending Type and screen: Pending Fecal occult positive EKG: 81 bpm in A-fib, no ST elevations or depressions significant for ischemia noted, no blocks noted  Discussion with Independent Historian:  Grandson  Discussion of Management of Tests: [place], DO Candie Chroman, NP Hospitalist  Risk: High: hospitalization or escalation of hospital-level care  Risk Stratification Score: None  Plan: On exam patient was in no acute distress with stable vitals.  Patient had unremarkable physical exam.  With a chaperone in the room a rectal exam was conducted that was positive.  Patient was not endorsing any belly pain, nausea/vomiting, hematemesis, tenderness palpation when palpated and did not have any peritoneal signs and so at this time doubt intra abdominal bleeding and so imaging was not obtained at this time.  Patient notes that he is on iron  supplements but does not take them as he does not like how it makes him feel.  Patient's outside CBC shows hemoglobin of 6.1 and repeat CBC here shows 6.5.  Patient's hgb is 6.5 requiring transfusion Patient does not have any religious beliefs that would restrict him from receiving blood. I spoke to the patient about the need for a blood transfusion and the risks associated with this such as transfusion reaction, allergic reaction, incompatibility, hemolysis, circulatory overload, etc.  Patient verbalized understanding and acceptance of these risks. Conversation witnessed by grandson, Daleen Squibb, RN. We will transfuse 1 units. Type and screen has already been ordered.  Due to positive fecal occult GI will be consulted for colonoscopy.  Lucila Maine states that they want to do a colonoscopy last time patient was admitted for symptomatic anemia due to the positive fecal occult however patient declined.  Anticipate admission due to symptomatic anemia along with needing a colonoscopy.  I spoke to GI and they agreed to scope the patient in the morning as long as patient is n.p.o. at midnight tonight.  Hospitalist to be consulted for admission.  I spoke to the hospital and patient was accepted for admission.  Patient stable for admission at this time.        Final Clinical Impression(s) / ED Diagnoses Final diagnoses:  Symptomatic anemia  Positive fecal occult blood test  Hypokalemia    Rx / DC Orders ED Discharge Orders     None         Remi Deter 03/25/23 1457    Benjiman Core, MD 03/25/23 [phone]

## 2023-03-25 NOTE — Discharge Instructions (Signed)
Discharge instructions; Stop taking the Eliquis Please make an appointment to see your family doctor tomorrow 9/3 to arrange for CBC/blood count to follow-up after receiving blood in the ER today Please make an appointment to see your cardiologist Dr. Elberta Fortis as soon as possible. Patient needs to return to the ED if he has worsening symptoms, noticeable blood or dark stool as well as blood emesis.  If he has worsening shortness of breath, dizziness or inability to stand he should also return to the emergency department

## 2023-03-25 NOTE — ED Notes (Signed)
Pt is refusing to be admitted. Md notified

## 2023-03-25 NOTE — Telephone Encounter (Signed)
Lab work drawn by [facility] results received from [employer] reviewed.patient's hemoglobin 6.1 and K+ 3.3.called patient to notify results [phone] but did not answer and voicemail not set up unable to leave voicemail.called another number [phone] patient's POA grandson [name] answered. Advised to take patient to ED for evaluation of low hemoglobin.States he was one hour away but will call the Aunt and headed to take patient to [facility] patient still feeling weak.

## 2023-03-25 NOTE — ED Notes (Signed)
ED TO INPATIENT HANDOFF REPORT  ED Nurse Name and Phone #: Huntley Dec [phone]  S Name/Age/Gender Ronnie Jackson 87 y.o. male Room/Bed: WA07/WA07  Code Status   Code Status: Full Code  Home/SNF/Other Patient oriented to: self, place, time, and situation Is this baseline? Yes   Triage Complete: Triage complete  Chief Complaint Acute blood loss anemia [D62]  Triage Note Patients hemoglobin was 6.4. Feeling short of breath, fatigued.    Allergies No Known Allergies  Level of Care/Admitting Diagnosis ED Disposition     ED Disposition  Admit   Condition  --   Comment  Hospital Area: [facility] [[id number]]  Level of Care: Progressive [102]  Admit to Progressive based on following criteria: GI, ENDOCRINE disease patients with GI bleeding, acute liver failure or pancreatitis, stable with diabetic ketoacidosis or thyrotoxicosis (hypothyroid) state.  May admit patient to [facility] or [facility] if equivalent level of care is available:: Yes  Covid Evaluation: Asymptomatic - no recent exposure (last 10 days) testing not required  Diagnosis: Acute blood loss anemia [147829]  Admitting Physician: Rodolph Bong [[id number]]  Attending Physician: Rodolph Bong [[id number]]  Certification:: I certify this patient will need inpatient services for at least 2 midnights  Expected Medical Readiness: 03/28/2023          B Medical/Surgery History Past Medical History:  Diagnosis Date   Anxiety state, unspecified    Atrial fibrillation (HCC) 03/06/2016   CHF (congestive heart failure) (HCC)    Chronic kidney disease (CKD)    Chronic pain syndrome    Constipation    Depressive disorder, not elsewhere classified    Dysphagia, unspecified(787.20)    Eye exam normal 09/01/2019   Diabetic eye exam did not reveal any retinopathy.    Herpes simplex disease    Hyperlipidemia    Hypertension    Hypopotassemia    Insomnia, unspecified    Intestinovesical fistula     Malignant neoplasm of prostate (HCC)    Myalgia and myositis, unspecified    Osteoarthritis    Pain in joint, lower leg    Bilateral knee pains   Reflux esophagitis    Rheumatoid arthritis with rheumatoid factor (HCC)    Spinal stenosis, unspecified region other than cervical    Thrombocytopenia, unspecified (HCC)    Type II or unspecified type diabetes mellitus without mention of complication, not stated as uncontrolled    Pt states that it is prediabetic   Unspecified arthropathy, pelvic region and thigh    Unspecified hereditary and idiopathic peripheral neuropathy    Past Surgical History:  Procedure Laterality Date   BIOPSY  12/01/2021   Procedure: BIOPSY;  Surgeon: Kerin Salen, MD;  Location: [facility];  Service: Gastroenterology;;   CARDIAC CATHETERIZATION  01/31/2004   Dr Allyson Sabal   CARDIOVERSION N/A 07/12/2016   Procedure: CARDIOVERSION;  Surgeon: Chrystie Nose, MD;  Location: [facility] END[facility]COPY;  Service: Cardiovascular;  Laterality: N/A;   COLONOSCOPY  08/09/2006   Dr Claudine Mouton, hemorrhoids/rectal fistula   cystocopy  07/2006   Dr [name]   ESOPHAGOGASTRODUODENOSCOPY N/A 12/01/2021   Procedure: ESOPHAGOGASTRODUODENOSCOPY (EGD);  Surgeon: Kerin Salen, MD;  Location: [facility];  Service: Gastroenterology;  Laterality: N/A;   NASAL SINUS SURGERY  1992   PROSTATE SURGERY  1991   TOTAL HIP ARTHROPLASTY Right 02/20/2018   Procedure: RIGHT TOTAL HIP ARTHROPLASTY ANTERIOR APPROACH;  Surgeon: Kathryne Hitch, MD;  Location: [facility];  Service: Orthopedics;  Laterality: Right;     A IV  Location/Drains/Wounds Patient Lines/Drains/Airways Status     Active Line/Drains/Airways     Name Placement date Placement time Site Days   Peripheral IV 11/29/21 22 G 1" Anterior;Right Forearm 11/29/21  2046  Forearm  481   Peripheral IV 03/25/23 18 G Anterior;Left Forearm 03/25/23  1343  Forearm  less than 1   Incision (Closed) 02/20/18 Hip Right 02/20/18  1227  -- 1859             Intake/Output Last 24 hours No intake or output data in the 24 hours ending 03/25/23 1503  Labs/Imaging Results for orders placed or performed during the hospital encounter of 03/25/23 (from the past 48 hour(s))  Basic metabolic panel     Status: Abnormal   Collection Time: 03/25/23  1:40 PM  Result Value Ref Range   Sodium 139 135 - 145 mmol/L   Potassium 2.8 (L) 3.5 - 5.1 mmol/L   Chloride 102 98 - 111 mmol/L   CO2 26 22 - 32 mmol/L   Glucose, Bld 102 (H) 70 - 99 mg/dL    Comment: Glucose reference range applies only to samples taken after fasting for at least 8 hours.   BUN 19 8 - 23 mg/dL   Creatinine, Ser 1.51 (H) 0.61 - 1.24 mg/dL   Calcium 7.8 (L) 8.9 - 10.3 mg/dL   GFR, Estimated 38 (L) >60 mL/min    Comment: (NOTE) Calculated using the CKD-EPI Creatinine Equation (2021)    Anion gap 11 5 - 15    Comment: Performed at [facility], [address] [place], [place] [address]  CBC with Differential     Status: Abnormal   Collection Time: 03/25/23  1:40 PM  Result Value Ref Range   WBC 8.5 4.0 - 10.5 K/uL   RBC 2.57 (L) 4.22 - 5.81 MIL/uL   Hemoglobin 6.5 (LL) 13.0 - 17.0 g/dL    Comment: REPEATED TO VERIFY THIS CRITICAL RESULT HAS VERIFIED AND BEEN CALLED TO P.DOWD, RN BY NATHAN THOMPSON ON 09 02 2024 AT 1406, AND HAS BEEN READ BACK. CRITICAL RESULT VERIFIED    HCT 21.3 (L) 39.0 - 52.0 %   MCV 82.9 80.0 - 100.0 fL   MCH 25.3 (L) 26.0 - 34.0 pg   MCHC 30.5 30.0 - 36.0 g/dL   RDW 73.7 (H) 10.6 - 26.9 %   Platelets 192 150 - 400 K/uL   nRBC 0.0 0.0 - 0.2 %   Neutrophils Relative % 60 %   Neutro Abs 5.1 1.7 - 7.7 K/uL   Lymphocytes Relative 30 %   Lymphs Abs 2.6 0.7 - 4.0 K/uL   Monocytes Relative 7 %   Monocytes Absolute 0.6 0.1 - 1.0 K/uL   Eosinophils Relative 2 %   Eosinophils Absolute 0.2 0.0 - 0.5 K/uL   Basophils Relative 1 %   Basophils Absolute 0.0 0.0 - 0.1 K/uL   Immature Granulocytes 0 %   Abs Immature Granulocytes 0.02 0.00 -  0.07 K/uL    Comment: Performed at [facility], [address], [place], [place] [address]  Iron and TIBC     Status: Abnormal   Collection Time: 03/25/23  1:40 PM  Result Value Ref Range   Iron 24 (L) 45 - 182 ug/dL   TIBC 270 350 - 093 ug/dL   Saturation Ratios 7 (L) 17.9 - 39.5 %   UIBC 347 ug/dL    Comment: Performed at [facility], [address] [place], [place] [address]  Ferritin (Iron Binding Protein)     Status: Abnormal   Collection Time: 03/25/23  1:40 PM  Result Value Ref Range   Ferritin 5 (L) 24 - 336 ng/mL    Comment: Performed at [facility], [address] [place], [place] [address]  Type and screen [facility]     Status: None (Preliminary result)   Collection Time: 03/25/23  1:45 PM  Result Value Ref Range   ABO/RH(D) O NEG    Antibody Screen NEG    Sample Expiration 03/28/2023,2359    Unit Number [id number]    Blood Component Type RED CELLS,LR    Unit division 00    Status of Unit ALLOCATED    Transfusion Status OK TO TRANSFUSE    Crossmatch Result      Compatible Performed at [facility], [address], [place], [place] [address]   Prepare RBC     Status: None   Collection Time: 03/25/23  1:45 PM  Result Value Ref Range   Order Confirmation      ORDER PROCESSED BY BLOOD BANK Performed at [facility], [address], [place], [place] [address]   POC occult blood, ED Provider will collect     Status: Abnormal   Collection Time: 03/25/23  1:47 PM  Result Value Ref Range   Fecal Occult Bld POSITIVE (A) NEGATIVE   No results found.  Pending Labs Unresulted Labs (From admission, onward)     Start     Ordered   03/26/23 0500  Comprehensive metabolic panel  Tomorrow morning,   R        03/25/23 1500   03/26/23 0500  CBC  Tomorrow morning,   R        03/25/23 1500            Vitals/Pain Today's Vitals   03/25/23 1251  03/25/23 1252 03/25/23 1300  BP: (!) 101/45  120/81  Pulse: (!) 125  91  Resp: (!) 22  20  Temp: 98.6 F (37 C)  98.3 F (36.8 C)  TempSrc: Oral  Oral  SpO2: 99%  96%  Weight:  81.6 kg   Height:  5\' 9"  (1.753 m)     Isolation Precautions No active isolations  Medications Medications  0.9 %  sodium chloride infusion (Manually program via Guardrails IV Fluids) (has no administration in time range)  sodium chloride flush (NS) 0.9 % injection 3 mL (has no administration in time range)  acetaminophen (TYLENOL) tablet 650 mg (has no administration in time range)    Or  acetaminophen (TYLENOL) suppository 650 mg (has no administration in time range)  ondansetron (ZOFRAN) tablet 4 mg (has no administration in time range)    Or  ondansetron (ZOFRAN) injection 4 mg (has no administration in time range)    Mobility walks     Focused Assessments  R Recommendations: See Admitting Provider Note  Report given to:   Additional Notes:

## 2023-03-25 NOTE — Telephone Encounter (Signed)
-----   Message from [facility] Evie J sent at 03/22/2023 12:58 PM EDT ----- abstracted

## 2023-03-25 NOTE — Consult Note (Signed)
Initial Consultation Note   Patient: Ronnie Jackson NWG:[id number] DOB: 05-22-1936 PCP: Ronnie Bookman, NP DOA: 03/25/2023 DOS: the patient was seen and examined on 03/25/2023 Primary service: Ronnie Bong, MD  Referring physician: Evlyn Kanner, PA for Ronnie Jackson Reason for consult: Evaluate patient for admission  Discharge instructions; Stop taking the Eliquis Please make an appointment to see your family doctor tomorrow 9/3 to arrange for CBC/blood count to follow-up after receiving blood in the ER today Please make an appointment to see your cardiologist Ronnie. Elberta Jackson as soon as possible. Patient needs to return to the ED if he has worsening symptoms, noticeable blood or dark stool as well as blood emesis.  If he has worsening shortness of breath, dizziness or inability to stand he should also return to the emergency department  Assessment and Plan: Acute blood loss anemia with subtle GI bleeding Fecal occult blood positive Patient was symptomatic with shortness of breath and fatigue prior to admission noting outpatient hemoglobin was 6.1-hemoglobin in ED 6.5 Patient did have significant lower extremity edema for which he takes Lasix and occasionally elevates his legs EDP ordered 1 unit of packed red blood cells. Despite explaining risks of leaving the hospital AGAINST MEDICAL ADVICE the patient insisted that he would go home and follow-up with his family doctor and cardiologist after leaving the ED.  He plans to make appointments beginning tomorrow. For now have stopped patient's Eliquis but he needs to follow-up with cardiologist to discuss this plan of care Patient will receive 1 unit of packed red blood cells in the ER.  I anticipate this taking 3 to 4 hours to infuse and then patient will be discharged from the emergency department per his request  Known mild HFrEF with moderate pulmonary hypertension Lower extremity edema and shortness of breath-Currently not requiring  oxygen Suspect mild exacerbation of heart failure worsened by significant anemia.  There appears to be a degree of venous stasis as well given the appearance of his legs.  He also does not elevate legs regularly and keeps his legs dependent In 2023 patient did have echocardiogram that revealed mild systolic dysfunction EF 40 to 57%.  I will give him Lasix 40 mg IV x 1 while receiving blood and he will resume his prior to admission heart failure regimen including oral Lasix.  Mild hypokalemia Potassium 2.8 Will give one-time dose of potassium 40 mill equivalents while here He will need to have electrolyte panel obtained at follow-up visit with primary care doctor as well  Iron deficiency anemia Likely secondary to chronic blood loss noting patient had a similar episode about 4 to 5 months ago and was supposed to undergo endoscopy but refused Grandson at bedside says patient does not take iron as prescribed     TRH will sign off at present, please call us again when needed.  Patient will be discharged from the emergency department  HPI: Ronnie Jackson is a 87 y.o. male with past medical history of chronic atrial fibrillation on Eliquis, mild systolic heart failure with associated moderate pulmonary hypertension, chronic lower extremity edema, chronic kidney disease stage IIIb, dyslipidemia, hypertension, chronic low potassium.  Patient was sent to the ED from his primary care physician's office due to symptomatic anemia with a hemoglobin of 6.1 obtained 24 hours prior he presented with fatigue and shortness of breath.  Patient also reported worsening lower extremity edema.  Family at bedside states patient does not take prescribed medication regularly.  He also does not take  his iron pills as ordered.  I went to evaluate the patient and he was stating he did not want to stay in the hospital that he could take care of his current issues by following up with his doctors outside the hospital.  He  states he just wants to go home.  I explained to him that his anemia is quite significant and that the low blood count is while he was feeling so poorly before coming in the hospital.  Given his underlying heart failure as well as anemia this is probably why his edema is unresolved.  He continued to insist on being discharged home.  I explained to him that I had concerns about continuing Eliquis with Hemoccult positive stools.  I told him he would need to follow-up with his heart doctor Ronnie. Elberta Jackson after discharge to let him know we had discontinued this medication.  I discussed this plan of care with my attending and we will discharge the patient from the ED with reservations.  Review of Systems: As mentioned in the history of present illness. All other systems reviewed and are negative. Past Medical History:  Diagnosis Date   Anxiety state, unspecified    Atrial fibrillation (HCC) 03/06/2016   CHF (congestive heart failure) (HCC)    Chronic kidney disease (CKD)    Chronic pain syndrome    Constipation    Depressive disorder, not elsewhere classified    Dysphagia, unspecified(787.20)    Eye exam normal 09/01/2019   Diabetic eye exam did not reveal any retinopathy.    Herpes simplex disease    Hyperlipidemia    Hypertension    Hypopotassemia    Insomnia, unspecified    Intestinovesical fistula    Malignant neoplasm of prostate (HCC)    Myalgia and myositis, unspecified    Osteoarthritis    Pain in joint, lower leg    Bilateral knee pains   Reflux esophagitis    Rheumatoid arthritis with rheumatoid factor (HCC)    Spinal stenosis, unspecified region other than cervical    Thrombocytopenia, unspecified (HCC)    Type II or unspecified type diabetes mellitus without mention of complication, not stated as uncontrolled    Pt states that it is prediabetic   Unspecified arthropathy, pelvic region and thigh    Unspecified hereditary and idiopathic peripheral neuropathy    Past Surgical  History:  Procedure Laterality Date   BIOPSY  12/01/2021   Procedure: BIOPSY;  Surgeon: Ronnie Salen, MD;  Location: [facility];  Service: Gastroenterology;;   CARDIAC CATHETERIZATION  01/31/2004   Ronnie Allyson Sabal   CARDIOVERSION N/A 07/12/2016   Procedure: CARDIOVERSION;  Surgeon: Chrystie Nose, MD;  Location: [facility] ENDOSCOPY;  Service: Cardiovascular;  Laterality: N/A;   COLONOSCOPY  08/09/2006   Ronnie Claudine Mouton, hemorrhoids/rectal fistula   cystocopy  07/2006   Ronnie [name]   ESOPHAGOGASTRODUODENOSCOPY N/A 12/01/2021   Procedure: ESOPHAGOGASTRODUODENOSCOPY (EGD);  Surgeon: Ronnie Salen, MD;  Location: [facility];  Service: Gastroenterology;  Laterality: N/A;   NASAL SINUS SURGERY  1992   PROSTATE SURGERY  1991   TOTAL HIP ARTHROPLASTY Right 02/20/2018   Procedure: RIGHT TOTAL HIP ARTHROPLASTY ANTERIOR APPROACH;  Surgeon: Kathryne Hitch, MD;  Location: [facility];  Service: Orthopedics;  Laterality: Right;   Social History:  reports that he has never smoked. He has never been exposed to tobacco smoke. His smokeless tobacco use includes chew. He reports that he does not drink alcohol and does not use drugs.  No Known Allergies  Family History  Problem Relation Age of Onset   Prostate cancer Father    Heart failure Mother    Lung cancer Daughter    Arthritis Sister     Prior to Admission medications   Medication Sig Start Date End Date Taking? Authorizing Provider  acetaminophen (TYLENOL) 500 MG tablet Take 1 tablet (500 mg total) by mouth every 8 (eight) hours as needed for moderate pain. 12/26/21   Ngetich, [name] C, NP  apixaban (ELIQUIS) 2.5 MG TABS tablet Take 1 tablet (2.5 mg total) by mouth 2 (two) times daily. 05/07/22   Ngetich, [name] C, NP  cefadroxil (DURICEF) 500 MG capsule Take 1 capsule (500 mg total) by mouth 2 (two) times daily. 12/11/22   Mast, Man X, NP  cromolyn (OPTICROM) 4 % ophthalmic solution Place 1 drop into both eyes 4 (four) times daily. 11/25/18   Ngetich, [name] C, NP   doxycycline (VIBRA-TABS) 100 MG tablet Take 1 tablet (100 mg total) by mouth 2 (two) times daily. 12/11/22   Mast, Man X, NP  furosemide (LASIX) 40 MG tablet Take 1 tablet (40 mg total) by mouth 2 (two) times daily. 05/07/22   Ngetich, [name] C, NP  KLOR-CON M20 20 MEQ tablet TAKE 2 TABLETS BY MOUTH DAILY WHEN TAKING LASIX (FUROSMIDE) Patient taking differently: Take 40 mEq by mouth daily. 05/14/22   Ngetich, [name] C, NP  Multiple Vitamins-Minerals (PRESERVISION AREDS 2 PO) Take 1 tablet by mouth daily.    [provider]  omega-3 acid ethyl esters (LOVAZA) 1 g capsule Take 1 g by mouth 2 (two) times daily.    [provider]  pantoprazole (PROTONIX) 40 MG tablet TAKE 1 TABLET BY MOUTH EVERY DAY 05/01/22   Ngetich, [name] C, NP  polyethylene glycol (MIRALAX / GLYCOLAX) 17 g packet Take 17 g by mouth daily. 11/28/19   Dietrich Pates, PA-C    Physical Exam: Vitals:   03/25/23 1251 03/25/23 1252 03/25/23 1300 03/25/23 1611  BP: (!) 101/45  120/81 106/62  Pulse: (!) 125  91 66  Resp: (!) 22  20 19   Temp: 98.6 F (37 C)  98.3 F (36.8 C) 97.8 F (36.6 C)  TempSrc: Oral  Oral Oral  SpO2: 99%  96% 97%  Weight:  81.6 kg    Height:  5\' 9"  (1.753 m)     Constitutional: NAD, calm, comfortable Respiratory: clear to auscultation bilaterally, no wheezing, no crackles. Normal respiratory effort. No accessory muscle use.  Room air Cardiovascular: Regular rate and rhythm, no murmurs / rubs / gallops marked pitting edema of both lower legs below the knee involving the feet.  There is also skin changes consistent with stasis dermatitis bilaterally.  1+ pedal pulses. No carotid bruits.  Abdomen: no tenderness, no masses palpated. No hepatosplenomegaly. Bowel sounds positive.  Musculoskeletal: no clubbing / cyanosis. No joint deformity upper and lower extremities. Good ROM, no contractures. Normal muscle tone.  Skin: no rashes, lesions, ulcers. No induration Neurologic: CN 2-12 grossly intact.  Sensation intact, DTR normal. Strength 5/5 x all 4 extremities.  Psychiatric: Normal judgment and insight. Alert and oriented x 3. Normal mood.    Data Reviewed:    Sodium 139, potassium 2.8, glucose 102, BUN 19, creatinine 1.73, GFR 38 Iron 24, ferritin 5 WBC 8.5, hemoglobin 6.5, MCV 82.9 Fecal occult blood positive     Family Communication: Grandchildren at bedside Primary team communication: Secure chat sent to patient's primary care physician, his cardiologist Ronnie. Elberta Jackson as well as GI physician Ronnie. Lorenso Quarry Thank  you very much for involving [place] in the care of your patient.  Author: Junious Silk, NP 03/25/2023 4:29 PM  For on call review [email or web address].

## 2023-03-25 NOTE — ED Triage Notes (Signed)
Patients hemoglobin was 6.4. Feeling short of breath, fatigued.

## 2023-03-26 LAB — TYPE AND SCREEN
ABO/RH(D): O NEG
Antibody Screen: NEGATIVE
Unit division: 0

## 2023-03-26 LAB — BPAM RBC
Blood Product Expiration Date: 202410022359
ISSUE DATE / TIME: 202409021558
Unit Type and Rh: 9500

## 2023-03-27 ENCOUNTER — Encounter (HOSPITAL_COMMUNITY): Payer: Self-pay | Admitting: Physician Assistant

## 2023-03-27 ENCOUNTER — Ambulatory Visit (HOSPITAL_COMMUNITY)
Admission: RE | Admit: 2023-03-27 | Discharge: 2023-03-27 | Disposition: A | Discharge status: Home / Self Care | Payer: PPO | Source: Ambulatory Visit | Attending: Physician Assistant | Admitting: Physician Assistant

## 2023-03-27 VITALS — BP 120/84 | HR 80 | Ht 69.0 in | Wt 186.0 lb

## 2023-03-27 DIAGNOSIS — E119 Type 2 diabetes mellitus without complications: Secondary | ICD-10-CM | POA: Diagnosis present

## 2023-03-27 DIAGNOSIS — D62 Acute posthemorrhagic anemia: Secondary | ICD-10-CM | POA: Diagnosis not present

## 2023-03-27 DIAGNOSIS — I4821 Permanent atrial fibrillation: Secondary | ICD-10-CM | POA: Diagnosis not present

## 2023-03-27 DIAGNOSIS — Z7901 Long term (current) use of anticoagulants: Secondary | ICD-10-CM | POA: Insufficient documentation

## 2023-03-27 DIAGNOSIS — D6869 Other thrombophilia: Secondary | ICD-10-CM | POA: Diagnosis not present

## 2023-03-27 DIAGNOSIS — N189 Chronic kidney disease, unspecified: Secondary | ICD-10-CM | POA: Diagnosis not present

## 2023-03-27 DIAGNOSIS — Z8546 Personal history of malignant neoplasm of prostate: Secondary | ICD-10-CM | POA: Diagnosis present

## 2023-03-27 DIAGNOSIS — R9431 Abnormal electrocardiogram [ECG] [EKG]: Secondary | ICD-10-CM | POA: Diagnosis not present

## 2023-03-27 DIAGNOSIS — I4891 Unspecified atrial fibrillation: Secondary | ICD-10-CM | POA: Diagnosis present

## 2023-03-27 DIAGNOSIS — I130 Hypertensive heart and chronic kidney disease with heart failure and stage 1 through stage 4 chronic kidney disease, or unspecified chronic kidney disease: Secondary | ICD-10-CM | POA: Insufficient documentation

## 2023-03-27 DIAGNOSIS — I5022 Chronic systolic (congestive) heart failure: Secondary | ICD-10-CM | POA: Insufficient documentation

## 2023-03-27 NOTE — Progress Notes (Signed)
Primary Care Physician: Caesar Bookman, NP Primary Cardiologist: Will Jorja Loa, MD Electrophysiologist: None  Referring Physician: Dr Jacki Cones is a 87 y.o. male with a history of prostate cancer, anemia, DM, AAA, atrial fibrillation who presents for follow up in the [facility].  The patient has been in permanent afib since 2017. Patient has a CHADS2VASC score of 6 and up until recently had been on Eliquis. He was seen at the ED 03/25/23 with acute blood loss anemia, Hgb was 6.5 and FOBT positive. He was transfused on unit of PRBC and hospitalist service was consulted for admission. However, patient declined testing and left hospital AMA. His Eliquis was held.   On follow up today, patient remains in permanent rate controlled afib. He is off Eliquis. He does feel weak and fatigued. He has not been taking his iron supplementation.   Today, he denies symptoms of palpitations, chest pain, shortness of breath, orthopnea, PND, lower extremity edema, dizziness, presyncope, syncope, snoring, daytime somnolence, or neurologic sequela. The patient is tolerating medications without difficulties and is otherwise without complaint today.    Atrial Fibrillation Risk Factors:  he does not have symptoms or diagnosis of sleep apnea. he does not have a history of rheumatic fever.   Atrial Fibrillation Management history:  Previous cardioversions: 2017 Anticoagulation history: Eliquis  ROS- All systems are reviewed and negative except as per the HPI above.  Past Medical History:  Diagnosis Date   Anxiety state, unspecified    Atrial fibrillation (HCC) 03/06/2016   CHF (congestive heart failure) (HCC)    Chronic kidney disease (CKD)    Chronic pain syndrome    Constipation    Depressive disorder, not elsewhere classified    Dysphagia, unspecified(787.20)    Eye exam normal 09/01/2019   Diabetic eye exam did not reveal any retinopathy.     Herpes simplex disease    Hyperlipidemia    Hypertension    Hypopotassemia    Insomnia, unspecified    Intestinovesical fistula    Malignant neoplasm of prostate (HCC)    Myalgia and myositis, unspecified    Osteoarthritis    Pain in joint, lower leg    Bilateral knee pains   Reflux esophagitis    Rheumatoid arthritis with rheumatoid factor (HCC)    Spinal stenosis, unspecified region other than cervical    Thrombocytopenia, unspecified (HCC)    Type II or unspecified type diabetes mellitus without mention of complication, not stated as uncontrolled    Pt states that it is prediabetic   Unspecified arthropathy, pelvic region and thigh    Unspecified hereditary and idiopathic peripheral neuropathy     Current Outpatient Medications  Medication Sig Dispense Refill   acetaminophen (TYLENOL) 500 MG tablet Take 1 tablet (500 mg total) by mouth every 8 (eight) hours as needed for moderate pain. 90 tablet 1   apixaban (ELIQUIS) 2.5 MG TABS tablet Take 1 tablet (2.5 mg total) by mouth 2 (two) times daily. 60 tablet 6   furosemide (LASIX) 40 MG tablet Take 1 tablet (40 mg total) by mouth 2 (two) times daily. 180 tablet 1   KLOR-CON M20 20 MEQ tablet TAKE 2 TABLETS BY MOUTH DAILY WHEN TAKING LASIX (FUROSMIDE) 180 tablet 1   Multiple Vitamins-Minerals (PRESERVISION AREDS 2 PO) Take 1 tablet by mouth daily.     polyethylene glycol (MIRALAX / GLYCOLAX) 17 g packet Take 17 g by mouth daily. 14 each 0   No  current facility-administered medications for this encounter.    Physical Exam: BP 120/84   Pulse 80   Ht 5\' 9"  (1.753 m)   Wt 84.4 kg   BMI 27.47 kg/m   GEN: Well nourished, well developed in no acute distress NECK: No JVD; No carotid bruits CARDIAC: Irregularly irregular rate and rhythm, no murmurs, rubs, gallops RESPIRATORY:  Clear to auscultation without rales, wheezing or rhonchi  ABDOMEN: Soft, non-tender, non-distended EXTREMITIES:  1+ bilateral edema, stasis dermatitis   Wt  Readings from Last 3 Encounters:  03/27/23 84.4 kg  03/25/23 81.6 kg  12/11/22 88.2 kg     EKG today demonstrates  Afib Vent. rate 80 BPM PR interval * ms QRS duration 80 ms QT/QTcB 398/459 ms  Echo 11/30/21 demonstrated   1. Left ventricular ejection fraction, by estimation, is 40 to 45%. Left  ventricular ejection fraction by 2D MOD biplane is 40.2 %. The left  ventricle has mildly decreased function. The left ventricle demonstrates  global hypokinesis. There is mild left ventricular hypertrophy. Left ventricular diastolic function could not be evaluated. Elevated left ventricular end-diastolic pressure. The E/e' is 19.   2. Right ventricular systolic function is low normal. The right  ventricular size is normal. There is moderately elevated pulmonary artery  systolic pressure. The estimated right ventricular systolic pressure is  53.2 mmHg.   3. Left atrial size was moderately dilated.   4. Right atrial size was mildly dilated.   5. The mitral valve is abnormal. Trivial mitral valve regurgitation.   6. The aortic valve is tricuspid. Aortic valve regurgitation is not  visualized. Aortic valve sclerosis/calcification is present, without any  evidence of aortic stenosis.   7. Aortic dilatation noted. There is borderline dilatation of the aortic  root, measuring 38 mm. There is mild dilatation of the ascending aorta,  measuring 42 mm. There is mild (Grade II) calcified plaque plaque  involving the aortic root.   8. The inferior vena cava is dilated in size with <50% respiratory  variability, suggesting right atrial pressure of 15 mmHg.   Comparison(s): Changes from prior study are noted. 04/19/2016: LVEF 65-70%, mild LVH, ascending aorta measured 41 mm.    CHA2DS2-VASc Score = 6  The patient's score is based upon: CHF History: 1 HTN History: 1 Diabetes History: 1 Stroke History: 0 Vascular Disease History: 1 (aortic atherosclerosis) Age Score: 2 Gender Score: 0        ASSESSMENT AND PLAN: Permanent Atrial Fibrillation (ICD10:  I48.11) The patient's CHA2DS2-VASc score is 6, indicating a 9.7% annual risk of stroke.   Patient rate controlled and asymptomatic We discussed his stroke risk and the risks and benefits of anticoagulation. Would not recommend resuming Eliquis at this time with ongoing bleeding from unknown source and anemia requiring transfusion. Strongly recommended that he follow up with GI to possibly identify cause of bleeding. Patient voices understanding of his stroke risk off anticoagulation. He will think about it and discuss with his family.   Secondary Hypercoagulable State (ICD10:  D68.69) The patient is at significant risk for stroke/thromboembolism based upon his CHA2DS2-VASc Score of 6.  However, the patient is not on anticoagulation due to his high bleeding risk.     Acute blood loss anemia Patient declined GI workup and left hospital AMA He will discuss if he wants to proceed with workup with his family and let us or his PCP know.   Chronic HFrEF EF 40-45% Fluid status appears stable today  HTN Stable on current regimen  Follow up with Dr Elberta Fortis in 2 months.        Jorja Loa PA-C [facility] [address] [place], [place] [address] [phone]

## 2023-03-29 ENCOUNTER — Ambulatory Visit (INDEPENDENT_AMBULATORY_CARE_PROVIDER_SITE_OTHER): Payer: HMO | Admitting: Family

## 2023-03-29 ENCOUNTER — Encounter: Payer: Self-pay | Admitting: Family

## 2023-03-29 VITALS — BP 140/80 | HR 70 | Temp 97.7°F | Resp 16 | Ht 68.0 in | Wt 194.0 lb

## 2023-03-29 DIAGNOSIS — L03116 Cellulitis of left lower limb: Secondary | ICD-10-CM | POA: Diagnosis not present

## 2023-03-29 DIAGNOSIS — E876 Hypokalemia: Secondary | ICD-10-CM | POA: Diagnosis not present

## 2023-03-29 DIAGNOSIS — D649 Anemia, unspecified: Secondary | ICD-10-CM

## 2023-03-29 MED ORDER — DOXYCYCLINE HYCLATE 100 MG PO TABS
100.0000 mg | ORAL_TABLET | Freq: Two times a day (BID) | ORAL | 0 refills | Status: DC
Start: 2023-03-29 — End: 2023-03-29

## 2023-03-29 MED ORDER — DOXYCYCLINE HYCLATE 100 MG PO TABS
100.0000 mg | ORAL_TABLET | Freq: Two times a day (BID) | ORAL | 0 refills | Status: AC
Start: 2023-03-29 — End: 2023-04-08

## 2023-03-29 NOTE — Progress Notes (Signed)
Provider: Richarda Blade FNP-C  Anasia Agro, [name], NP  Patient Care Team: Tarrah Furuta, [name], NP as PCP - General (Family Medicine) [name], MD as PCP - Cardiology (Cardiology) [name], NP as Nurse Practitioner (Cardiology) [name], MD as Consulting Physician (Pulmonary Disease) [name], MD as Consulting Physician (Neurosurgery) [name], MD as Referring Physician (Surgery) [name], [name], MD (General Surgery) [name], MD as Consulting Physician (Rheumatology) [name], MD as Consulting Physician (Ophthalmology)  Extended Emergency Contact Information Primary Emergency Contact: [name],[name] Mobile Phone: [phone] Relation: Grandson Secondary Emergency Contact: [name],[name] Mobile Phone: [phone] Relation: Daughter  Code Status:  Full Code  Goals of care: Advanced Directive information    03/29/2023    3:00 PM  Advanced Directives  Does Patient Have a Medical Advance Directive? No     Chief Complaint  Patient presents with   Hospitalization Follow-up    Left hospital AMA, saw cardiology where he refused GI work up for Anemia.    HPI:  Pt is a 87 y.o. male seen today for an acute visit for follow up ED visit on 03/25/2023 for low hemoglobin level of 6.1 lab work done by [facility] showed low Hgb PCP spoke with Lucila Maine to take patient  ASAP to ED.Fecal occult was positive.Also had low potassium level 2.8 .EKG showed Afib 81 bmp without any ST elevation or depression or blocks noted.Patient states was not taking his iron supplements.Repeat Hgb in ED was 6.5 He was transfused 1 unit of PRBC with much improvement of fatigue.GI was consulted due to positive occult blood. Patient declined colonoscopy in the past and during recent hospitalization.   He declines referral to GI states has a lot. States was not taking his iron and Protonix but has been taking as prescribed. He denies any acute issues  today.Declines GI referral.would like to continue with Iron for now states will notify provider if he changes his mind.  Also states left leg skin redness has recurred.Previously treated with antibiotics with improvement.U/S was ordered but unclear but states has seen vascular.    Past Medical History:  Diagnosis Date   Anxiety state, unspecified    Atrial fibrillation (HCC) 03/06/2016   CHF (congestive heart failure) (HCC)    Chronic kidney disease (CKD)    Chronic pain syndrome    Constipation    Depressive disorder, not elsewhere classified    Dysphagia, unspecified(787.20)    Eye exam normal 09/01/2019   Diabetic eye exam did not reveal any retinopathy.    Herpes simplex disease    Hyperlipidemia    Hypertension    Hypopotassemia    Insomnia, unspecified    Intestinovesical fistula    Malignant neoplasm of prostate (HCC)    Myalgia and myositis, unspecified    Osteoarthritis    Pain in joint, lower leg    Bilateral knee pains   Reflux esophagitis    Rheumatoid arthritis with rheumatoid factor (HCC)    Spinal stenosis, unspecified region other than cervical    Thrombocytopenia, unspecified (HCC)    Type II or unspecified type diabetes mellitus without mention of complication, not stated as uncontrolled    Pt states that it is prediabetic   Unspecified arthropathy, pelvic region and thigh    Unspecified hereditary and idiopathic peripheral neuropathy    Past Surgical History:  Procedure Laterality Date   BIOPSY  12/01/2021   Procedure: BIOPSY;  Surgeon: Kerin Salen, MD;  Location: [facility];  Service: Gastroenterology;;   CARDIAC CATHETERIZATION  01/31/2004   Dr Allyson Sabal   CARDIOVERSION N/A 07/12/2016   Procedure: CARDIOVERSION;  Surgeon: Chrystie Nose, MD;  Location: [facility] ENDOSCOPY;  Service: Cardiovascular;  Laterality: N/A;   COLONOSCOPY  08/09/2006   Dr Claudine Mouton, hemorrhoids/rectal fistula   cystocopy  07/2006   Dr [name]   ESOPHAGOGASTRODUODENOSCOPY N/A  12/01/2021   Procedure: ESOPHAGOGASTRODUODENOSCOPY (EGD);  Surgeon: Kerin Salen, MD;  Location: [facility];  Service: Gastroenterology;  Laterality: N/A;   NASAL SINUS SURGERY  1992   PROSTATE SURGERY  1991   TOTAL HIP ARTHROPLASTY Right 02/20/2018   Procedure: RIGHT TOTAL HIP ARTHROPLASTY ANTERIOR APPROACH;  Surgeon: Kathryne Hitch, MD;  Location: [facility] ORS;  Service: Orthopedics;  Laterality: Right;    No Known Allergies  Outpatient Encounter Medications as of 03/29/2023  Medication Sig   acetaminophen (TYLENOL) 500 MG tablet Take 1 tablet (500 mg total) by mouth every 8 (eight) hours as needed for moderate pain.   furosemide (LASIX) 40 MG tablet Take 1 tablet (40 mg total) by mouth 2 (two) times daily.   KLOR-CON M20 20 MEQ tablet TAKE 2 TABLETS BY MOUTH DAILY WHEN TAKING LASIX (FUROSMIDE)   Multiple Vitamins-Minerals (PRESERVISION AREDS 2 PO) Take 1 tablet by mouth daily.   polyethylene glycol (MIRALAX / GLYCOLAX) 17 g packet Take 17 g by mouth daily.   apixaban (ELIQUIS) 2.5 MG TABS tablet Take 1 tablet (2.5 mg total) by mouth 2 (two) times daily. (Patient not taking: Reported on 03/29/2023)   No facility-administered encounter medications on file as of 03/29/2023.    Review of Systems  Constitutional:  Negative for appetite change, chills, fatigue, fever and unexpected weight change.  HENT:  Negative for congestion, dental problem, ear discharge, ear pain, facial swelling, hearing loss, nosebleeds, postnasal drip, rhinorrhea, sinus pressure, sinus pain, sneezing, sore throat, tinnitus and trouble swallowing.   Eyes:  Negative for pain, discharge, redness, itching and visual disturbance.  Respiratory:  Negative for cough, chest tightness, shortness of breath and wheezing.   Cardiovascular:  Positive for leg swelling. Negative for chest pain and palpitations.  Gastrointestinal:  Negative for abdominal distention, abdominal pain, blood in stool, constipation, diarrhea, nausea and  vomiting.  Endocrine: Negative for cold intolerance, heat intolerance, polydipsia, polyphagia and polyuria.  Genitourinary:  Negative for difficulty urinating, dysuria, flank pain, frequency and urgency.  Musculoskeletal:  Positive for gait problem. Negative for arthralgias, back pain, joint swelling, myalgias, neck pain and neck stiffness.  Skin:  Negative for color change, pallor, rash and wound.  Neurological:  Negative for dizziness, syncope, speech difficulty, weakness, light-headedness, numbness and headaches.  Hematological:  Does not bruise/bleed easily.  Psychiatric/Behavioral:  Negative for agitation, behavioral problems, confusion, hallucinations, self-injury, sleep disturbance and suicidal ideas. The patient is not nervous/anxious.     Immunization History  Administered Date(s) Administered   Fluad Quad(high Dose 65+) 10/19/2019   Influenza, High Dose Seasonal PF 04/18/2017, 05/12/2018   Influenza,inj,Quad PF,6+ Mos 06/21/2015   Pneumococcal Conjugate-13 06/21/2015   Pneumococcal Polysaccharide-23 11/15/2016   Pertinent  Health Maintenance Due  Topic Date Due   OPHTHALMOLOGY EXAM  08/31/2020   FOOT EXAM  05/02/2021   HEMOGLOBIN A1C  12/12/2022   INFLUENZA VACCINE  Discontinued      01/02/2022   12:37 PM 06/07/2022    1:57 PM 12/11/2022    3:04 PM 01/09/2023    3:36 PM 03/29/2023    3:00 PM  Fall Risk  Falls in the past year? 0 0 0 0 0  Was there an  injury with Fall? 0 0 0 0   Fall Risk Category Calculator 0 0 0 0   Fall Risk Category (Retired) Low Low     (RETIRED) Patient Fall Risk Level  Low fall risk     Patient at Risk for Falls Due to  No Fall Risks No Fall Risks No Fall Risks   Fall risk Follow up  Falls evaluation completed Falls evaluation completed     Functional Status Survey:    Vitals:   03/29/23 1501  BP: (!) 140/80  Pulse: 70  Resp: 16  Temp: 97.7 F (36.5 C)  SpO2: 97%  Weight: 194 lb (88 kg)  Height: 5\' 8"  (1.727 m)   Body mass index is  29.5 kg/m. Physical Exam Vitals reviewed.  Constitutional:      General: He is not in acute distress.    Appearance: Normal appearance. He is overweight. He is not ill-appearing or diaphoretic.  HENT:     Head: Normocephalic.     Right Ear: Tympanic membrane, ear canal and external ear normal. There is no impacted cerumen.     Left Ear: Tympanic membrane, ear canal and external ear normal. There is no impacted cerumen.     Nose: Nose normal. No congestion or rhinorrhea.     Mouth/Throat:     Mouth: Mucous membranes are moist.     Pharynx: Oropharynx is clear. No oropharyngeal exudate or posterior oropharyngeal erythema.  Eyes:     General: No scleral icterus.       Right eye: No discharge.        Left eye: No discharge.     Extraocular Movements: Extraocular movements intact.     Conjunctiva/sclera: Conjunctivae normal.     Pupils: Pupils are equal, round, and reactive to light.  Neck:     Vascular: No carotid bruit.  Cardiovascular:     Rate and Rhythm: Normal rate and regular rhythm.     Pulses: Normal pulses.     Heart sounds: Normal heart sounds. No murmur heard.    No friction rub. No gallop.  Pulmonary:     Effort: Pulmonary effort is normal. No respiratory distress.     Breath sounds: Normal breath sounds. No wheezing, rhonchi or rales.  Chest:     Chest wall: No tenderness.  Abdominal:     General: Bowel sounds are normal. There is no distension.     Palpations: Abdomen is soft. There is no mass.     Tenderness: There is no abdominal tenderness. There is no right CVA tenderness, left CVA tenderness, guarding or rebound.  Musculoskeletal:        General: No swelling or tenderness. Normal range of motion.     Cervical back: Normal range of motion. No rigidity or tenderness.     Right lower leg: No edema.     Left lower leg: No edema.  Lymphadenopathy:     Cervical: No cervical adenopathy.  Skin:    General: Skin is warm and dry.     Coloration: Skin is not pale.      Findings: No bruising, erythema, lesion or rash.  Neurological:     Mental Status: He is alert and oriented to person, place, and time.     Cranial Nerves: No cranial nerve deficit.     Sensory: No sensory deficit.     Motor: No weakness.     Coordination: Coordination normal.     Gait: Gait abnormal.  Psychiatric:  Mood and Affect: Mood normal.        Speech: Speech normal.        Behavior: Behavior normal.        Thought Content: Thought content normal.        Judgment: Judgment normal.    Labs reviewed: Recent Labs    06/13/22 0946 10/17/22 0000 03/22/23 0000 03/25/23 1340  NA 145 143 141 139  K 3.7 4.4 3.3* 2.8*  CL 106 106 103 102  CO2 29 25* 23* 26  GLUCOSE 101*  --   --  102*  BUN 19 24* 18 19  CREATININE 1.78* 1.7* 1.4* 1.73*  CALCIUM 8.2* 8.2* 7.9* 7.8*   Recent Labs    06/13/22 0946 10/17/22 0000 03/22/23 0000  AST 14 17 15   ALT 5* 10 8*  ALKPHOS  --  65 74  BILITOT 0.8  --   --   PROT 6.6  --   --   ALBUMIN  --  4.1 3.4*   Recent Labs    06/13/22 0946 10/17/22 0000 03/22/23 0000 03/25/23 1340  WBC 6.2 8.3 7.9 8.5  NEUTROABS 3,088 4.30 4.20 5.1  HGB 7.8* 7.3* 6.1* 6.5*  HCT 25.0* 24* 20* 21.3*  MCV 89.9  --   --  82.9  PLT 203 237 187 192   Lab Results  Component Value Date   TSH 4.89 (H) 06/13/2022   Lab Results  Component Value Date   HGBA1C 6.4 (H) 06/13/2022   Lab Results  Component Value Date   CHOL 137 06/13/2022   HDL 38 (L) 06/13/2022   LDLCALC 85 06/13/2022   TRIG 63 06/13/2022   CHOLHDL 3.6 06/13/2022    Significant Diagnostic Results in last 30 days:  No results found.  Assessment/Plan 1. Symptomatic anemia Status post ED visit for low Hgb 6.1 recheck 6.5  - CBC with Differential/Platelet  2. Hypokalemia K+ 2.8 replete in ED  - Basic metabolic panel  3. Cellulitis of left lower extremity Left leg erythema none tenderness and not warm to touch. - encourage to elevate leg when seated  - recommended  referral to lymphedema clinic but declines.Also declined vascular follow up states did ultrasound which was normal though no report for evaluation. - advised to notify provider if symptoms worsen or fail to improve  - doxycycline (VIBRA-TABS) 100 MG tablet; Take 1 tablet (100 mg total) by mouth 2 (two) times daily for 10 days.  Dispense: 20 tablet; Refill: 0  Family/ staff Communication: Reviewed plan of care with patient and Grandson verbalized understanding  Labs/tests ordered:  - Basic metabolic panel - CBC with Differential/Platelet  Next Appointment: Return if symptoms worsen or fail to improve.   Caesar Bookman, NP

## 2023-03-30 LAB — CBC WITH DIFFERENTIAL/PLATELET
Absolute Monocytes: 647 {cells}/uL (ref 200–950)
Basophils Absolute: 84 {cells}/uL (ref 0–200)
Basophils Relative: 1 %
Eosinophils Absolute: 227 {cells}/uL (ref 15–500)
Eosinophils Relative: 2.7 %
HCT: 25.7 % — ABNORMAL LOW (ref 38.5–50.0)
Hemoglobin: 7.8 g/dL — ABNORMAL LOW (ref 13.2–17.1)
Lymphs Abs: 2831 {cells}/uL (ref 850–3900)
MCH: 25.6 pg — ABNORMAL LOW (ref 27.0–33.0)
MCHC: 30.4 g/dL — ABNORMAL LOW (ref 32.0–36.0)
MCV: 84.3 fL (ref 80.0–100.0)
MPV: 11.6 fL (ref 7.5–12.5)
Monocytes Relative: 7.7 %
Neutro Abs: 4612 cells/uL (ref 1500–7800)
Neutrophils Relative %: 54.9 %
Platelets: 220 10*3/uL (ref 140–400)
RBC: 3.05 10*6/uL — ABNORMAL LOW (ref 4.20–5.80)
RDW: 17.2 % — ABNORMAL HIGH (ref 11.0–15.0)
Total Lymphocyte: 33.7 %
WBC: 8.4 10*3/uL (ref 3.8–10.8)

## 2023-03-30 LAB — BASIC METABOLIC PANEL
BUN/Creatinine Ratio: 13 (calc) (ref 6–22)
BUN: 18 mg/dL (ref 7–25)
CO2: 26 mmol/L (ref 20–32)
Calcium: 8.2 mg/dL — ABNORMAL LOW (ref 8.6–10.3)
Chloride: 108 mmol/L (ref 98–110)
Creat: 1.35 mg/dL — ABNORMAL HIGH (ref 0.70–1.22)
Glucose, Bld: 92 mg/dL (ref 65–99)
Potassium: 4.4 mmol/L (ref 3.5–5.3)
Sodium: 143 mmol/L (ref 135–146)

## 2023-04-02 ENCOUNTER — Other Ambulatory Visit: Payer: Self-pay

## 2023-05-02 ENCOUNTER — Other Ambulatory Visit: Payer: Self-pay | Admitting: Family

## 2023-05-02 DIAGNOSIS — R600 Localized edema: Secondary | ICD-10-CM

## 2023-05-28 ENCOUNTER — Encounter: Payer: Self-pay | Admitting: Cardiology

## 2023-05-28 ENCOUNTER — Ambulatory Visit: Payer: PPO | Attending: Cardiology | Admitting: Cardiology

## 2023-05-28 VITALS — BP 120/72 | HR 73 | Ht 68.0 in | Wt 201.2 lb

## 2023-05-28 DIAGNOSIS — I5022 Chronic systolic (congestive) heart failure: Secondary | ICD-10-CM | POA: Diagnosis not present

## 2023-05-28 DIAGNOSIS — I890 Lymphedema, not elsewhere classified: Secondary | ICD-10-CM | POA: Diagnosis not present

## 2023-05-28 DIAGNOSIS — I4821 Permanent atrial fibrillation: Secondary | ICD-10-CM | POA: Diagnosis not present

## 2023-05-28 DIAGNOSIS — R609 Edema, unspecified: Secondary | ICD-10-CM

## 2023-05-28 DIAGNOSIS — D6869 Other thrombophilia: Secondary | ICD-10-CM

## 2023-05-28 NOTE — Patient Instructions (Signed)
Medication Instructions:  INCREASE Furosemide (Lasix) to 80 mg twice daily for 7 days only INCREASE Potassium to 60 meq (3 tablets) daily for 7 days only while taking the increased dose of lasix *If you need a refill on your cardiac medications before your next appointment, please call your pharmacy*   Lab Work: BMET after completion of 1 week furosemide increase  If you have labs (blood work) drawn today and your tests are completely normal, you will receive your results only by: MyChart Message (if you have MyChart) OR A paper copy in the mail If you have any lab test that is abnormal or we need to change your treatment, we will call you to review the results.   Follow-Up: At [facility], you and your health needs are our priority.  As part of our continuing mission to provide you with exceptional heart care, we have created designated Provider Care Teams.  These Care Teams include your primary Cardiologist (physician) and Advanced Practice Providers (APPs -  Physician Assistants and Nurse Practitioners) who all work together to provide you with the care you need, when you need it.  We recommend signing up for the patient portal called "MyChart".  Sign up information is provided on this After Visit Summary.  MyChart is used to connect with patients for Virtual Visits (Telemedicine).  Patients are able to view lab/test results, encounter notes, upcoming appointments, etc.  Non-urgent messages can be sent to your provider as well.   To learn more about what you can do with MyChart, go to [email or web address].    Your next appointment:   2 week(s)  Provider:   Jari Favre, PA-C, Ronie Spies, PA-C, Robin Searing, NP, Jacolyn Reedy, PA-C, Eligha Bridegroom, NP, Tereso Newcomer, PA-C, Perlie Gold, PA-C, or Cyndi Bender, NP     Follow up with an EP APP in 6 months

## 2023-05-28 NOTE — Progress Notes (Addendum)
  Electrophysiology Office Note:   Date:  05/28/2023  ID:  Ronnie Jackson, DOB Mar 06, 1936, MRN [id number]  Primary Cardiologist: None Electrophysiologist: Ronnie Poss Ronnie Loa, MD      History of Present Illness:   Ronnie Jackson is a 87 y.o. male with h/o atrial fibrillation, CKD, hypertension, diabetes seen today for routine electrophysiology followup.   He presented to the hospital in early September with acute blood loss anemia.  He received transfusion.  Patient was discharged home prior to colonoscopy.  Since last being seen in our clinic the patient reports that if he can lower extremity edema.  He states that he cannot wear shoes due to his edema.  He has evidence of both lymphedema and fluid in his lower extremities.  He states that his feet feel quite heavy and he has had skin breakdown.  he denies chest pain, palpitations, dyspnea, PND, orthopnea, nausea, vomiting, dizziness, syncope, weight gain, or early satiety.   Review of systems complete and found to be negative unless listed in HPI.   EP Information / Studies Reviewed:    EKG is ordered today. Personal review as below.  EKG Interpretation Date/Time:  Tuesday May 28 2023 15:58:29 EST Ventricular Rate:  73 PR Interval:    QRS Duration:  82 QT Interval:  434 QTC Calculation: 478 R Axis:   -21  Text Interpretation: Atrial fibrillation Septal infarct (cited on or before 27-Mar-2023) When compared with ECG of 27-Mar-2023 14:44, No significant change was found Confirmed by Ronnie Jackson ([id number]) on 05/28/2023 4:00:42 PM   Risk Assessment/Calculations:    CHA2DS2-VASc Score = 6   This indicates a 9.7% annual risk of stroke. The patient's score is based upon: CHF History: 1 HTN History: 1 Diabetes History: 1 Stroke History: 0 Vascular Disease History: 1 (aortic atherosclerosis) Age Score: 2 Gender Score: 0             Physical Exam:   VS:  BP 120/72 (BP Location: Left Arm, Patient Position: Sitting, Cuff  Size: Large)   Pulse 73   Ht 5\' 8"  (1.727 m)   Wt 201 lb 3.2 oz (91.3 kg)   SpO2 98%   BMI 30.59 kg/m    Wt Readings from Last 3 Encounters:  05/28/23 201 lb 3.2 oz (91.3 kg)  03/29/23 194 lb (88 kg)  03/27/23 186 lb (84.4 kg)     GEN: Well nourished, well developed in no acute distress NECK: No JVD; No carotid bruits CARDIAC: Irregularly irregular rate and rhythm, no murmurs, rubs, gallops RESPIRATORY:  Clear to auscultation without rales, wheezing or rhonchi  ABDOMEN: Soft, non-tender, non-distended EXTREMITIES: Significant lymphedema with 2+ edema  ASSESSMENT AND PLAN:    1.  Permanent atrial fibrillation: Currently well rate controlled  2.  Secondary hypercoagulable state: Not on Eliquis due to significant GI bleeding on Eliquis.  Encourage patient to follow-up with GI.  3.  Chronic systolic heart failure: Has significant lower extremity edema.  Ronnie Jackson increase Lasix to 80 mg twice daily and increase potassium to 60 mill equivalents a day for 1 week and then back to his normal dose.  He Ronnie Jackson need to establish with general cardiology.  4.  Lower extremity edema with lymphedema: Has significant lower extremity edema also complicated by lymphedema.  He has had prostate cancer and had what sounds like prostatectomy with likely lymph node dissection.  Increase Lasix as above  Follow up with EP APP in 6 months  Signed, Ronnie Jackson Ronnie Loa, MD

## 2023-06-04 DIAGNOSIS — I4821 Permanent atrial fibrillation: Secondary | ICD-10-CM | POA: Diagnosis not present

## 2023-06-05 ENCOUNTER — Telehealth (INDEPENDENT_AMBULATORY_CARE_PROVIDER_SITE_OTHER): Payer: PPO | Admitting: Family

## 2023-06-05 ENCOUNTER — Encounter: Payer: Self-pay | Admitting: Family

## 2023-06-05 DIAGNOSIS — S81802A Unspecified open wound, left lower leg, initial encounter: Secondary | ICD-10-CM

## 2023-06-05 DIAGNOSIS — R600 Localized edema: Secondary | ICD-10-CM | POA: Diagnosis not present

## 2023-06-05 DIAGNOSIS — L03116 Cellulitis of left lower limb: Secondary | ICD-10-CM

## 2023-06-05 LAB — BASIC METABOLIC PANEL
BUN/Creatinine Ratio: 14 (ref 10–24)
BUN: 18 mg/dL (ref 8–27)
CO2: 24 mmol/L (ref 20–29)
Calcium: 8.4 mg/dL — ABNORMAL LOW (ref 8.6–10.2)
Chloride: 108 mmol/L — ABNORMAL HIGH (ref 96–106)
Creatinine, Ser: 1.26 mg/dL (ref 0.76–1.27)
Glucose: 106 mg/dL — ABNORMAL HIGH (ref 70–99)
Potassium: 5.2 mmol/L (ref 3.5–5.2)
Sodium: 144 mmol/L (ref 134–144)
eGFR: 56 mL/min/{1.73_m2} — ABNORMAL LOW (ref >59)

## 2023-06-05 MED ORDER — DOXYCYCLINE HYCLATE 100 MG PO TABS
100.0000 mg | ORAL_TABLET | Freq: Two times a day (BID) | ORAL | 0 refills | Status: AC
Start: 2023-06-05 — End: 2023-06-12

## 2023-06-05 NOTE — Progress Notes (Signed)
This service is provided via telemedicine  No vital signs collected/recorded due to the encounter was a telemedicine visit.   Location of patient (ex: home, work):  Home  Patient consents to a telephone visit:    Location of the provider (ex: office, home):  Office  Name of any referring provider:  Laurelai Lepp, [name], NP   Names of all persons participating in the telemedicine service and their role in the encounter:  Gillis Ends; Judithann Sauger McClurkin,CMA; Ewart Carrera Ngetick,NP  Time spent on call:  10 minutes    Provider: Lania Zawistowski FNP-C  Trachelle Low, [name], NP  Patient Care Team: Ameris Akamine, [name], NP as PCP - General (Family Medicine) [name], MD as PCP - Electrophysiology (Cardiology) [name], NP as Nurse Practitioner (Cardiology) [name], MD as Consulting Physician (Pulmonary Disease) [name], MD as Consulting Physician (Neurosurgery) [name], MD as Referring Physician (Surgery) [name], [name], MD (General Surgery) [name], MD as Consulting Physician (Rheumatology) [name], MD as Consulting Physician (Ophthalmology)  Extended Emergency Contact Information Primary Emergency Contact: [name],[name] Mobile Phone: [phone] Relation: Grandson Secondary Emergency Contact: [name],[name] Mobile Phone: [phone] Relation: Daughter  Code Status:  Full Code  Goals of care: Advanced Directive information    03/29/2023    3:00 PM  Advanced Directives  Does Patient Have a Medical Advance Directive? No     Chief Complaint  Patient presents with   Acute Visit    Patient presents today for bilateral leg pain, smell and discoloration. Daughter reports patient can barely walk and doesn't like wearing shoes due to leg pain.    HPI:  Pt is a 87 y.o. male seen today for an acute visit for evaluation of bilateral leg pain,smell and discoloration.Has had difficulties walking.does not like to wear shoes due  to leg pain.Patient's sister states HHN was stopped who was doing a good job keeping it clean  would like  [employer] Nurse [name] back to assist with managing wounds.States [name] did excellent job managing the wounds.Per family " Nurse was very Caring".  Also request referral to lymphedema clinic.    Past Medical History:  Diagnosis Date   Anxiety state, unspecified    Atrial fibrillation (HCC) 03/06/2016   CHF (congestive heart failure) (HCC)    Chronic kidney disease (CKD)    Chronic pain syndrome    Constipation    Depressive disorder, not elsewhere classified    Dysphagia, unspecified(787.20)    Eye exam normal 09/01/2019   Diabetic eye exam did not reveal any retinopathy.    Herpes simplex disease    Hyperlipidemia    Hypertension    Hypopotassemia    Insomnia, unspecified    Intestinovesical fistula    Malignant neoplasm of prostate (HCC)    Myalgia and myositis, unspecified    Osteoarthritis    Pain in joint, lower leg    Bilateral knee pains   Reflux esophagitis    Rheumatoid arthritis with rheumatoid factor (HCC)    Spinal stenosis, unspecified region other than cervical    Thrombocytopenia, unspecified (HCC)    Type II or unspecified type diabetes mellitus without mention of complication, not stated as uncontrolled    Pt states that it is prediabetic   Unspecified arthropathy, pelvic region and thigh    Unspecified hereditary and idiopathic peripheral neuropathy    Past Surgical History:  Procedure Laterality Date   BIOPSY  12/01/2021   Procedure: BIOPSY;  Surgeon: Kerin Salen, MD;  Location: [facility] ENDOSCOPY;  Service: Gastroenterology;;   CARDIAC CATHETERIZATION  01/31/2004   Dr Allyson Sabal   CARDIOVERSION N/A 07/12/2016   Procedure: CARDIOVERSION;  Surgeon: Chrystie Nose, MD;  Location: [facility] ENDOSCOPY;  Service: Cardiovascular;  Laterality: N/A;   COLONOSCOPY  08/09/2006   Dr Claudine Mouton, hemorrhoids/rectal fistula   cystocopy  07/2006   Dr [name]    ESOPHAGOGASTRODUODENOSCOPY N/A 12/01/2021   Procedure: ESOPHAGOGASTRODUODENOSCOPY (EGD);  Surgeon: Kerin Salen, MD;  Location: [facility];  Service: Gastroenterology;  Laterality: N/A;   NASAL SINUS SURGERY  1992   PROSTATE SURGERY  1991   TOTAL HIP ARTHROPLASTY Right 02/20/2018   Procedure: RIGHT TOTAL HIP ARTHROPLASTY ANTERIOR APPROACH;  Surgeon: Kathryne Hitch, MD;  Location: [facility] ORS;  Service: Orthopedics;  Laterality: Right;    No Known Allergies  Outpatient Encounter Medications as of 06/05/2023  Medication Sig   acetaminophen (TYLENOL) 500 MG tablet Take 1 tablet (500 mg total) by mouth every 8 (eight) hours as needed for moderate pain.   aspirin EC 81 MG tablet Take 81 mg by mouth daily. Swallow whole.   furosemide (LASIX) 40 MG tablet TAKE 1 TABLET BY MOUTH TWICE A DAY   KLOR-CON M20 20 MEQ tablet TAKE 2 TABLETS BY MOUTH DAILY WHEN TAKING LASIX (FUROSMIDE)   Multiple Vitamins-Minerals (PRESERVISION AREDS 2 PO) Take 1 tablet by mouth daily.   polyethylene glycol (MIRALAX / GLYCOLAX) 17 g packet Take 17 g by mouth daily.   VITAMIN D PO Take 1 tablet by mouth in the morning and at bedtime.   apixaban (ELIQUIS) 2.5 MG TABS tablet Take 1 tablet (2.5 mg total) by mouth 2 (two) times daily. (Patient not taking: Reported on 03/29/2023)   No facility-administered encounter medications on file as of 06/05/2023.    Review of Systems  Constitutional:  Negative for appetite change, chills, fatigue, fever and unexpected weight change.  Respiratory:  Negative for cough, chest tightness, shortness of breath and wheezing.   Cardiovascular:  Positive for leg swelling. Negative for chest pain and palpitations.       Left leg swelling has worsen.    Genitourinary:  Negative for difficulty urinating, dysuria, flank pain, frequency and urgency.  Musculoskeletal:  Negative for arthralgias, back pain, gait problem, joint swelling, myalgias, neck pain and neck stiffness.  Skin:  Positive for  wound. Negative for color change, pallor and rash.       Strong Odor from wound on left leg.  Neurological:  Negative for dizziness, weakness, light-headedness, numbness and headaches.    Immunization History  Administered Date(s) Administered   Fluad Quad(high Dose 65+) 10/19/2019   Influenza, High Dose Seasonal PF 04/18/2017, 05/12/2018   Influenza,inj,Quad PF,6+ Mos 06/21/2015   Pneumococcal Conjugate-13 06/21/2015   Pneumococcal Polysaccharide-23 11/15/2016   Pertinent  Health Maintenance Due  Topic Date Due   OPHTHALMOLOGY EXAM  08/31/2020   FOOT EXAM  05/02/2021   HEMOGLOBIN A1C  12/12/2022   INFLUENZA VACCINE  Discontinued      06/07/2022    1:57 PM 12/11/2022    3:04 PM 01/09/2023    3:36 PM 03/29/2023    3:00 PM 06/05/2023    2:07 PM  Fall Risk  Falls in the past year? 0 0 0 0 0  Was there an injury with Fall? 0 0 0  0  Fall Risk Category Calculator 0 0 0  0  Fall Risk Category (Retired) Low      (RETIRED) Patient Fall Risk Level Low fall risk      Patient at  Risk for Falls Due to No Fall Risks No Fall Risks No Fall Risks  No Fall Risks  Fall risk Follow up Falls evaluation completed Falls evaluation completed   Falls evaluation completed   Functional Status Survey:    There were no vitals filed for this visit. There is no height or weight on file to calculate BMI. Physical Exam Constitutional:      General: He is not in acute distress.    Appearance: He is not ill-appearing.  Pulmonary:     Effort: Pulmonary effort is normal. No respiratory distress.  Musculoskeletal:     Left lower leg: Edema present.  Skin:    Comments: Left leg below the knee and shin area yellow crusty skin.lateral left red open areas noted.    Neurological:     Mental Status: He is alert and oriented to person, place, and time.     Gait: Gait abnormal.  Psychiatric:        Mood and Affect: Mood normal.        Behavior: Behavior normal.     Labs reviewed: Recent Labs     03/25/23 1340 03/29/23 1558 06/04/23 1547  NA 139 143 144  K 2.8* 4.4 5.2  CL 102 108 108*  CO2 26 26 24   GLUCOSE 102* 92 106*  BUN 19 18 18   CREATININE 1.73* 1.35* 1.26  CALCIUM 7.8* 8.2* 8.4*   Recent Labs    06/13/22 0946 10/17/22 0000 03/22/23 0000  AST 14 17 15   ALT 5* 10 8*  ALKPHOS  --  65 74  BILITOT 0.8  --   --   PROT 6.6  --   --   ALBUMIN  --  4.1 3.4*   Recent Labs    06/13/22 0946 10/17/22 0000 03/22/23 0000 03/25/23 1340 03/29/23 1558  WBC 6.2   < > 7.9 8.5 8.4  NEUTROABS 3,088   < > 4.20 5.1 4,612  HGB 7.8*   < > 6.1* 6.5* 7.8*  HCT 25.0*   < > 20* 21.3* 25.7*  MCV 89.9  --   --  82.9 84.3  PLT 203   < > 187 192 220   < > = values in this interval not displayed.   Lab Results  Component Value Date   TSH 4.89 (H) 06/13/2022   Lab Results  Component Value Date   HGBA1C 6.4 (H) 06/13/2022   Lab Results  Component Value Date   CHOL 137 06/13/2022   HDL 38 (L) 06/13/2022   LDLCALC 85 06/13/2022   TRIG 63 06/13/2022   CHOLHDL 3.6 06/13/2022    Significant Diagnostic Results in last 30 days:  No results found.  Assessment/Plan 1. Edema of both lower extremities Continue on diuretic  - Ambulatory referral to Home Health - will refer to lymp edema clinic   2. Cellulitis of left lower extremity Extensive erythema with crusty dry skin noted.odor also reported by family and patient. -start on doxycycline as below - doxycycline (VIBRA-TABS) 100 MG tablet; Take 1 tablet (100 mg total) by mouth 2 (two) times daily for 7 days.  Dispense: 14 tablet; Refill: 0  3. Open wound of left lower leg, initial encounter Drainage and odor reported. Request [facility] [name] who managed the wound in the past to be ordered.per family Nurse did excellent Job and was caring.  - Ambulatory referral to Home Health    Family/ staff Communication: Reviewed plan of care with patient ,Sister and Nephew verbalized understanding.  Labs/tests  ordered: None    Next Appointment: Return if symptoms worsen or fail to improve.   I connected with  [name] on 06/05/23 by a video enabled telemedicine application and verified that I am speaking with the correct person using two identifiers.   I discussed the limitations of evaluation and management by telemedicine. The patient expressed understanding and agreed to proceed.   Spent 12 minutes of face to face with patient  >50% time spent counseling; reviewing medical record; labs; documenting and developing future plan of care.   Caesar Bookman, NP

## 2023-06-06 ENCOUNTER — Ambulatory Visit: Payer: PPO | Attending: Cardiology

## 2023-06-06 DIAGNOSIS — I4821 Permanent atrial fibrillation: Secondary | ICD-10-CM

## 2023-06-08 DIAGNOSIS — E1122 Type 2 diabetes mellitus with diabetic chronic kidney disease: Secondary | ICD-10-CM | POA: Diagnosis not present

## 2023-06-08 DIAGNOSIS — L03116 Cellulitis of left lower limb: Secondary | ICD-10-CM | POA: Diagnosis not present

## 2023-06-08 DIAGNOSIS — Z7901 Long term (current) use of anticoagulants: Secondary | ICD-10-CM | POA: Diagnosis not present

## 2023-06-08 DIAGNOSIS — K219 Gastro-esophageal reflux disease without esophagitis: Secondary | ICD-10-CM | POA: Diagnosis not present

## 2023-06-08 DIAGNOSIS — F32A Depression, unspecified: Secondary | ICD-10-CM | POA: Diagnosis not present

## 2023-06-08 DIAGNOSIS — M791 Myalgia, unspecified site: Secondary | ICD-10-CM | POA: Diagnosis not present

## 2023-06-08 DIAGNOSIS — Z7982 Long term (current) use of aspirin: Secondary | ICD-10-CM | POA: Diagnosis not present

## 2023-06-08 DIAGNOSIS — M1990 Unspecified osteoarthritis, unspecified site: Secondary | ICD-10-CM | POA: Diagnosis not present

## 2023-06-08 DIAGNOSIS — E785 Hyperlipidemia, unspecified: Secondary | ICD-10-CM | POA: Diagnosis not present

## 2023-06-08 DIAGNOSIS — I4891 Unspecified atrial fibrillation: Secondary | ICD-10-CM | POA: Diagnosis not present

## 2023-06-08 DIAGNOSIS — M609 Myositis, unspecified: Secondary | ICD-10-CM | POA: Diagnosis not present

## 2023-06-08 DIAGNOSIS — F411 Generalized anxiety disorder: Secondary | ICD-10-CM | POA: Diagnosis not present

## 2023-06-08 DIAGNOSIS — N189 Chronic kidney disease, unspecified: Secondary | ICD-10-CM | POA: Diagnosis not present

## 2023-06-08 DIAGNOSIS — M4800 Spinal stenosis, site unspecified: Secondary | ICD-10-CM | POA: Diagnosis not present

## 2023-06-08 DIAGNOSIS — M059 Rheumatoid arthritis with rheumatoid factor, unspecified: Secondary | ICD-10-CM | POA: Diagnosis not present

## 2023-06-08 DIAGNOSIS — G4700 Insomnia, unspecified: Secondary | ICD-10-CM | POA: Diagnosis not present

## 2023-06-08 DIAGNOSIS — G894 Chronic pain syndrome: Secondary | ICD-10-CM | POA: Diagnosis not present

## 2023-06-08 DIAGNOSIS — D509 Iron deficiency anemia, unspecified: Secondary | ICD-10-CM | POA: Diagnosis not present

## 2023-06-08 DIAGNOSIS — R1310 Dysphagia, unspecified: Secondary | ICD-10-CM | POA: Diagnosis not present

## 2023-06-08 DIAGNOSIS — Z556 Problems related to health literacy: Secondary | ICD-10-CM | POA: Diagnosis not present

## 2023-06-08 DIAGNOSIS — Z8546 Personal history of malignant neoplasm of prostate: Secondary | ICD-10-CM | POA: Diagnosis not present

## 2023-06-08 DIAGNOSIS — I130 Hypertensive heart and chronic kidney disease with heart failure and stage 1 through stage 4 chronic kidney disease, or unspecified chronic kidney disease: Secondary | ICD-10-CM | POA: Diagnosis not present

## 2023-06-08 DIAGNOSIS — I509 Heart failure, unspecified: Secondary | ICD-10-CM | POA: Diagnosis not present

## 2023-06-12 ENCOUNTER — Ambulatory Visit: Payer: PPO | Attending: Physician Assistant | Admitting: Physician Assistant

## 2023-06-12 NOTE — Progress Notes (Deleted)
{This patient may be at risk for Amyloid. He has one or more dx on the problem list or PMH from the following list - Abnormal EKG, CHF, Aortic Stenosis, Proteinuria, LVH, Carpal Tunnel Syndrome, Biceps Tendon Rupture, Syncope. See list below or review PMH.  Diagnoses From Problem List           Noted     CHF (congestive heart failure) (HCC) Unknown     Chronic systolic CHF (congestive heart failure) (HCC) 03/25/2023    Click HERE to open Cardiac Amyloid Screening SmartSet to order screening OR Click HERE to defer testing for 1 year or permanently :1}    Cardiology Office Note:    Date:  06/12/2023  ID:  Ronnie Jackson, DOB 01-29-1936, MRN [id number] PCP: Caesar Bookman, NP  [facility] Providers Cardiologist:  None Cardiology APP:  [name], NP  Electrophysiologist:  Will Jorja Loa, MD { Click to update primary MD,subspecialty MD or APP then REFRESH:1}    {Click to Open Review  :1}   Patient Profile:      HFmrEF (heart failure with mildly reduced ejection fraction) TTE 04/19/2016: Mild LVH, EF 65-70, no RWMA, ascending aorta 41 mm Myoview 07/17/2016: No ischemia TTE 11/30/2021: EF 40-45, global HK, mild LVH, low normal RVSF, moderate pulmonary hypertension, RVSP 53.2, moderate LAE, mild RAE, trivial MR, AV sclerosis, ascending aorta 42 mm, RAP 15 Permanent atrial fibrillation  S/p DCCV in 2017 Abdominal aortic aneurysm  Abdominal CT 11/2019: Infrarenal 3.2 cm-F/U US rec 3 years Diabetes mellitus  Hypertension  Hyperlipidemia  Chronic kidney disease  Rheumatoid arthritis  Prostate CA Hx of GI bleed       {      :1}   History of Present Illness:  Discussed the use of AI scribe software for clinical note transcription with the patient, who gave verbal consent to proceed.  Ronnie Jackson is a 87 y.o. male who is referred by Dr. Elberta Fortis for general cardiology evaluation.  He has been followed by EP for atrial fibrillation.  EF was noted to be 40-45 on  echocardiogram during an admission in May 2023 for symptomatic anemia.  Patient evidence of gastritis on EGD but declined colonoscopy.  He was transfused 2 units PRBCs.  He was seen in the emergency room again in September 2024 with symptomatic anemia with hemoglobin in the 6 range.  He underwent transfusion with PRBCs.  He declined further workup and left AMA.  He was taken off of Eliquis due to GI bleeding.  He was seen in clinic by Dr. Elberta Fortis 05/28/2023.  He was noted to have significant lower extremity edema.  His furosemide was increased.            ROS   See HPI ***    Studies Reviewed:       *** Results          Risk Assessment/Calculations:   {Does this patient have ATRIAL FIBRILLATION?:[phone]} No BP recorded.  {Refresh Note OR Click here to enter BP  :1}***       Physical Exam:   VS:  There were no vitals taken for this visit.   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 stress test, cath, DCCV, TEE, etc)?   Press F2        :[id number]}  Dispo:  No follow-ups on file.  Signed, Tereso Newcomer, PA-C

## 2023-06-13 ENCOUNTER — Encounter: Payer: Self-pay | Admitting: Physician Assistant

## 2023-06-17 ENCOUNTER — Telehealth: Payer: PPO | Admitting: Adult Health

## 2023-06-17 ENCOUNTER — Encounter: Payer: Self-pay | Admitting: Adult Health

## 2023-06-17 DIAGNOSIS — I5022 Chronic systolic (congestive) heart failure: Secondary | ICD-10-CM | POA: Diagnosis not present

## 2023-06-17 DIAGNOSIS — L03116 Cellulitis of left lower limb: Secondary | ICD-10-CM | POA: Diagnosis not present

## 2023-06-17 MED ORDER — DOXYCYCLINE HYCLATE 100 MG PO TABS: 100.0000 mg | ORAL_TABLET | Freq: Two times a day (BID) | ORAL | 0 refills | Status: AC

## 2023-06-17 NOTE — Progress Notes (Addendum)
DATE:  06/19/2023 MRN:  [id number]  BIRTHDAY: 31-Jul-1935   Contact Information     Name Relation Home Work Mobile   [place]   [phone]      Other Contacts     Name Relation Home Work Mobile   [name],[name] Daughter   [phone]   Adella Hare Granddaughter   [phone]        Code Status History     Date Active Date Inactive Code Status Order ID Comments User Context   03/25/2023 1501 03/26/2023 0010 Full Code [id number]  Russella Dar, NP ED   11/29/2021 2305 12/01/2021 2114 Full Code [id number]  Therisa Doyne, MD Inpatient   02/20/2018 1423 02/23/2018 1805 Full Code [id number]  Kathryne Hitch, MD Inpatient    Questions for Most Recent Historical Code Status (Order [id number])     Question Answer   By: Procedural case: previous code status reviewed               Chief Complaint  Patient presents with   Acute Visit    Lower extremity edema    Patient location:  Home  Provider location:  [facility]  HISTORY OF PRESENT ILLNESS: This is an 87 year old male who had a video visit for leg swelling. Lucila Maine was with him during the video visit. He has BLE edema with left leg draining and erythematous. Per grandson, left lower leg is warm to touch.  He denies chills nor fever.  He currently takes Furosemide 40 mg BID for CHF.  He denies SOB.  PAST MEDICAL HISTORY:  Past Medical History:  Diagnosis Date   Anxiety state, unspecified    Atrial fibrillation (HCC) 03/06/2016   CHF (congestive heart failure) (HCC)    Chronic kidney disease (CKD)    Chronic pain syndrome    Constipation    Depressive disorder, not elsewhere classified    Dysphagia, unspecified(787.20)    Eye exam normal 09/01/2019   Diabetic eye exam did not reveal any retinopathy.    Herpes simplex disease    Hyperlipidemia    Hypertension    Hypopotassemia    Insomnia, unspecified    Intestinovesical fistula    Malignant neoplasm of prostate (HCC)     Myalgia and myositis, unspecified    Osteoarthritis    Pain in joint, lower leg    Bilateral knee pains   Reflux esophagitis    Rheumatoid arthritis with rheumatoid factor (HCC)    Spinal stenosis, unspecified region other than cervical    Thrombocytopenia, unspecified (HCC)    Type II or unspecified type diabetes mellitus without mention of complication, not stated as uncontrolled    Pt states that it is prediabetic   Unspecified arthropathy, pelvic region and thigh    Unspecified hereditary and idiopathic peripheral neuropathy      CURRENT MEDICATIONS: Reviewed  Patient's Medications  New Prescriptions   DOXYCYCLINE (VIBRA-TABS) 100 MG TABLET    Take 1 tablet (100 mg total) by mouth 2 (two) times daily for 10 days.  Previous Medications   ACETAMINOPHEN (TYLENOL) 500 MG TABLET    Take 1 tablet (500 mg total) by mouth every 8 (eight) hours as needed for moderate pain.   APIXABAN (ELIQUIS) 2.5 MG TABS TABLET    Take 1 tablet (2.5 mg total) by mouth 2 (two) times daily.   ASPIRIN EC 81 MG TABLET    Take 81 mg by mouth daily. Swallow whole.   FUROSEMIDE (LASIX) 40 MG TABLET    TAKE 1 TABLET  BY MOUTH TWICE A DAY   KLOR-CON M20 20 MEQ TABLET    TAKE 2 TABLETS BY MOUTH DAILY WHEN TAKING LASIX (FUROSMIDE)   MULTIPLE VITAMINS-MINERALS (PRESERVISION AREDS 2 PO)    Take 1 tablet by mouth daily.   POLYETHYLENE GLYCOL (MIRALAX / GLYCOLAX) 17 G PACKET    Take 17 g by mouth daily.   VITAMIN D PO    Take 1 tablet by mouth in the morning and at bedtime.  Modified Medications   No medications on file  Discontinued Medications   No medications on file     No Known Allergies   REVIEW OF SYSTEMS:  GENERAL: no change in appetite, no fatigue, no fever, chills or weakness SKIN: Left lower leg is erythematous EYES: Denies change in vision, dry eyes, eye pain, itching or discharge EARS: Denies change in hearing, ringing in ears, or earache NOSE: Denies nasal congestion or epistaxis MOUTH and  THROAT: Denies oral discomfort, gingival pain or bleeding, pain from teeth or hoarseness   RESPIRATORY: no cough, SOB, DOE, wheezing, hemoptysis CARDIAC: has BLE edema , R > L GI: no abdominal pain, diarrhea, constipation, heart burn, nausea or vomiting GU: Denies dysuria, frequency, hematuria, incontinence, or discharge NEUROLOGICAL: Denies dizziness, syncope, numbness, or headache PSYCHIATRIC: Denies feeling of depression or anxiety. No report of hallucinations, insomnia, paranoia, or agitation    LABS/RADIOLOGY: Labs reviewed: Basic Metabolic Panel: Recent Labs    03/25/23 1340 03/29/23 1558 06/04/23 1547  NA 139 143 144  K 2.8* 4.4 5.2  CL 102 108 108*  CO2 26 26 24   GLUCOSE 102* 92 106*  BUN 19 18 18   CREATININE 1.73* 1.35* 1.26  CALCIUM 7.8* 8.2* 8.4*   Liver Function Tests: Recent Labs    10/17/22 0000 03/22/23 0000  AST 17 15  ALT 10 8*  ALKPHOS 65 74  ALBUMIN 4.1 3.4*   No results for input(s): "LIPASE", "AMYLASE" in the last 8760 hours. No results for input(s): "AMMONIA" in the last 8760 hours. CBC: Recent Labs    03/22/23 0000 03/25/23 1340 03/29/23 1558  WBC 7.9 8.5 8.4  NEUTROABS 4.20 5.1 4,612  HGB 6.1* 6.5* 7.8*  HCT 20* 21.3* 25.7*  MCV  --  82.9 84.3  PLT 187 192 220   A1C: Invalid input(s): "A1C" Lipid Panel: No results for input(s): "HDL" in the last 8760 hours.  Invalid input(s): "LDL", "TRIGLYCERIDE", "TOTALCHOL" Cardiac Enzymes: No results for input(s): "CKTOTAL", "CKMB", "CKMBINDEX", "TROPONINI" in the last 8760 hours. BNP: Invalid input(s): "POCBNP" CBG: No results for input(s): "GLUCAP" in the last 8760 hours.    No results found.  ASSESSMENT/PLAN:  1. Cellulitis of left lower leg -   keep skin clean and dry -   will start on Doxycycline - doxycycline (VIBRA-TABS) 100 MG tablet; Take 1 tablet (100 mg total) by mouth 2 (two) times daily for 10 days.  Dispense: 20 tablet; Refill: 0  2. Chronic systolic congestive heart  failure (HCC) -  no SOB -  continue Furosemide     Time spent on non face to face visit:  12 minutes  The patient gave consent to this video visit. Explained to the patient the risk and privacy issue that was involved with this video call.   The patient was advised to call back and ask for an in-person evaluation if the symptoms worsen or if the condition fails to improve.   Kenard Gower, NP [employer] [phone]

## 2023-07-01 ENCOUNTER — Telehealth: Payer: Self-pay | Admitting: *Deleted

## 2023-07-01 DIAGNOSIS — L03116 Cellulitis of left lower limb: Secondary | ICD-10-CM | POA: Diagnosis not present

## 2023-07-01 LAB — HEPATIC FUNCTION PANEL
ALT: 12 U/L (ref 10–40)
AST: 21 (ref 14–40)
Alkaline Phosphatase: 89 (ref 25–125)
Bilirubin, Total: 1

## 2023-07-01 LAB — BASIC METABOLIC PANEL
BUN: 17 (ref 4–21)
CO2: 19 (ref 13–22)
Chloride: 112 — AB (ref 99–108)
Creatinine: 1.3 (ref 0.6–1.3)
Glucose: 89
Potassium: 4.7 meq/L (ref 3.5–5.1)
Sodium: 144 (ref 137–147)

## 2023-07-01 LAB — CBC AND DIFFERENTIAL
HCT: 27 — AB (ref 41–53)
Hemoglobin: 8 — AB (ref 13.5–17.5)
Neutrophils Absolute: 5.2
Platelets: 185 10*3/uL (ref 150–400)
WBC: 8.1

## 2023-07-01 LAB — COMPREHENSIVE METABOLIC PANEL
Albumin: 3.4 — AB (ref 3.5–5.0)
Calcium: 8.3 — AB (ref 8.7–10.7)
Globulin: 2.5

## 2023-07-01 LAB — CBC: RBC: 2.85 — AB (ref 3.87–5.11)

## 2023-07-01 NOTE — Telephone Encounter (Signed)
[facility] Nurse, Erskine Emery called [phone] and verbal orders given to her to draw labs and collect Urine.

## 2023-07-01 NOTE — Telephone Encounter (Signed)
Ronnie Jackson, daughter called and stated that patient is Hallucinations, trouble walking and very confused. Daughter stated that he is talking to his wife that has been deceased for 3 years now.   [facility] is coming out to home today at 2pm to evaluate and daughter is wanting an order for them to draw labs on patient.    Please Advise.

## 2023-07-01 NOTE — Telephone Encounter (Signed)
HHN to draw CBC/diff and CMP  Also collect urine specimen for U/A and C/S r/o UTI due to increased confusion.If symptoms worsen recommend ED or urgent care for evaluation of hallucination.

## 2023-07-03 ENCOUNTER — Telehealth: Payer: Self-pay | Admitting: Family

## 2023-07-03 NOTE — Telephone Encounter (Signed)
Lab results received scanned in chart from [facility] Hemoglobin level 8.0 still low but stable. Kidney function also high but slightly improved compared to previous level. Total protein and albumin are low recommend drinking Ensure or Boost supplement one 237 mls daily.

## 2023-07-03 NOTE — Telephone Encounter (Signed)
-----   Message from [facility] sent at 07/02/2023  3:47 PM EST ----- Abstracted

## 2023-07-04 NOTE — Telephone Encounter (Signed)
Call was placed to patient to discuss lab results,recording came on stating that call couldn't be completed at this time. We will need to make additional attempts to reach patient.

## 2023-07-09 NOTE — Telephone Encounter (Signed)
Ronnie Jackson called to inquire about lab results as she had not heard anything. Discussed results with patients daughter, Alexia Freestone verbalized understanding of results and questioned if there was any indication about the source of hallucinations (that was the reason for labs)   Please advise

## 2023-07-09 NOTE — Telephone Encounter (Signed)
Might need to follow up for hallucination and collect urine specimen.white blood cells normal no signs of infection.

## 2023-07-09 NOTE — Telephone Encounter (Signed)
Outgoing call placed to patients daughter, no answer, and no voicemail. We will need to make additional attempts to reach patient.

## 2023-07-10 NOTE — Telephone Encounter (Signed)
Spoke with Ronnie Jackson, scheduled appointment for tomorrow with [facility]

## 2023-07-11 ENCOUNTER — Encounter: Payer: PPO | Admitting: Adult Health

## 2023-07-11 NOTE — Progress Notes (Signed)
This encounter was created in error - please disregard.

## 2023-07-26 ENCOUNTER — Encounter: Payer: Self-pay | Admitting: Family

## 2023-07-26 ENCOUNTER — Telehealth: Payer: PPO | Admitting: Family

## 2023-07-26 DIAGNOSIS — R600 Localized edema: Secondary | ICD-10-CM

## 2023-07-26 DIAGNOSIS — L03116 Cellulitis of left lower limb: Secondary | ICD-10-CM | POA: Diagnosis not present

## 2023-07-26 MED ORDER — DOXYCYCLINE HYCLATE 100 MG PO TABS: 100.0000 mg | ORAL_TABLET | Freq: Two times a day (BID) | ORAL | 0 refills | Status: DC

## 2023-07-26 MED ORDER — DOXYCYCLINE HYCLATE 100 MG PO TABS: 100.0000 mg | ORAL_TABLET | Freq: Two times a day (BID) | ORAL | 0 refills | Status: AC

## 2023-07-26 MED ORDER — FUROSEMIDE 40 MG PO TABS: 40.0000 mg | ORAL_TABLET | Freq: Two times a day (BID) | ORAL | 1 refills | Status: DC

## 2023-07-26 MED ORDER — CEFADROXIL 500 MG PO CAPS: 500.0000 mg | ORAL_CAPSULE | Freq: Two times a day (BID) | ORAL | 0 refills | Status: AC

## 2023-07-26 NOTE — Progress Notes (Addendum)
 This service is provided via telemedicine  No vital signs collected/recorded due to the encounter was a telemedicine visit.   Location of patient (ex: home, work):  Home  Patient consents to a telephone visit: Yes  Location of the provider (ex: office, home):  [facility], Office   Name of any referring provider:  N/A  Names of all persons participating in the telemedicine service and their role in the encounter:  Shelli Ferrier, CMA, Patient daughter Odetta Ceclia fry Mateen Franssen, Roxan BROCKS, NP   Time spent on call:  9 min with medical assistant       Provider: Gitel Beste FNP-C  Matty Vanroekel, Roxan BROCKS, NP  Patient Care Team: Alessia Gonsalez, Roxan BROCKS, NP as PCP - General (Family Medicine) Inocencio Soyla Lunger, MD as PCP - Electrophysiology (Cardiology) Aleatha Silvano HERO, NP as Nurse Practitioner (Cardiology) Neysa Reggy BIRCH, MD as Consulting Physician (Pulmonary Disease) Colon Shove, MD as Consulting Physician (Neurosurgery) Lane Shope, MD as Referring Physician (Surgery) [name], Charlie ORN., MD (General Surgery) Dolphus Reiter, MD as Consulting Physician (Rheumatology) Octavia Charleston, MD as Consulting Physician (Ophthalmology)  Extended Emergency Contact Information Primary Emergency Contact: [name],[name] Mobile Phone: [phone] Relation: Grandson Secondary Emergency Contact: [name],[name] Mobile Phone: [phone] Relation: Daughter  Code Status:  Full Code  Goals of care: Advanced Directive information    07/26/2023    2:25 PM  Advanced Directives  Does Patient Have a Medical Advance Directive? No  Would patient like information on creating a medical advance directive? No - Patient declined     Chief Complaint  Patient presents with   Leg Swelling    Leg swelling wants referral to edema clinic.    HPI:  Pt is a 88 y.o. male seen today for an acute visit for evaluation of leg swelling.He is seen with assistance from  [place] and [address]He states leg swelling has worsen for the past two weeks.Has had fluid leaking and running down the legs.Had blisters that opened up on the left leg. States legs are painful when he walks.Also has redness on both legs but denies any tenderness on the calf muscle.He denies any fever, chills,cough ,wheezing or shortness of breath.  States was discharged by home health Nurse. Nephew states patient has not been taking Furosemide .states cannot make it to the bathroom on time so he stopped furosemide . Also declines to wear compression stockings.   Had similar symptoms in the past required two antibiotics.   Past Medical History:  Diagnosis Date   Anxiety state, unspecified    Atrial fibrillation (HCC) 03/06/2016   CHF (congestive heart failure) (HCC)    Chronic kidney disease (CKD)    Chronic pain syndrome    Constipation    Depressive disorder, not elsewhere classified    Dysphagia, unspecified(787.20)    Eye exam normal 09/01/2019   Diabetic eye exam did not reveal any retinopathy.    Herpes simplex disease    Hyperlipidemia    Hypertension    Hypopotassemia    Insomnia, unspecified    Intestinovesical fistula    Malignant neoplasm of prostate (HCC)    Myalgia and myositis, unspecified    Osteoarthritis    Pain in joint, lower leg    Bilateral knee pains   Reflux esophagitis    Rheumatoid arthritis with rheumatoid factor (HCC)    Spinal stenosis, unspecified region other than cervical    Thrombocytopenia, unspecified (HCC)    Type II or unspecified type diabetes mellitus without mention of complication, not stated as  uncontrolled    Pt states that it is prediabetic   Unspecified arthropathy, pelvic region and thigh    Unspecified hereditary and idiopathic peripheral neuropathy    Past Surgical History:  Procedure Laterality Date   BIOPSY  12/01/2021   Procedure: BIOPSY;  Surgeon: Saintclair Jasper, MD;  Location: [facility];  Service: Gastroenterology;;   CARDIAC  CATHETERIZATION  01/31/2004   Dr Court   CARDIOVERSION N/A 07/12/2016   Procedure: CARDIOVERSION;  Surgeon: Vinie JAYSON Maxcy, MD;  Location: [facility]COPY;  Service: Cardiovascular;  Laterality: N/A;   COLONOSCOPY  08/09/2006   Dr Lane, hemorrhoids/rectal fistula   cystocopy  07/2006   Dr [name]   ESOPHAGOGASTRODUODENOSCOPY N/A 12/01/2021   Procedure: ESOPHAGOGASTRODUODENOSCOPY (EGD);  Surgeon: Saintclair Jasper, MD;  Location: [facility];  Service: Gastroenterology;  Laterality: N/A;   NASAL SINUS SURGERY  1992   PROSTATE SURGERY  1991   TOTAL HIP ARTHROPLASTY Right 02/20/2018   Procedure: RIGHT TOTAL HIP ARTHROPLASTY ANTERIOR APPROACH;  Surgeon: Vernetta Lonni GRADE, MD;  Location: [facility];  Service: Orthopedics;  Laterality: Right;    No Known Allergies  Outpatient Encounter Medications as of 07/26/2023  Medication Sig   aspirin EC 81 MG tablet Take 81 mg by mouth daily. Swallow whole.   polyethylene glycol (MIRALAX  / GLYCOLAX ) 17 g packet Take 17 g by mouth daily.   acetaminophen  (TYLENOL ) 500 MG tablet Take 1 tablet (500 mg total) by mouth every 8 (eight) hours as needed for moderate pain. (Patient not taking: Reported on 06/17/2023)   apixaban  (ELIQUIS ) 2.5 MG TABS tablet Take 1 tablet (2.5 mg total) by mouth 2 (two) times daily. (Patient not taking: Reported on 03/29/2023)   furosemide  (LASIX ) 40 MG tablet TAKE 1 TABLET BY MOUTH TWICE A DAY (Patient not taking: Reported on 07/26/2023)   KLOR-CON  M20 20 MEQ tablet TAKE 2 TABLETS BY MOUTH DAILY WHEN TAKING LASIX  (FUROSMIDE) (Patient not taking: Reported on 07/26/2023)   Multiple Vitamins-Minerals (PRESERVISION AREDS 2 PO) Take 1 tablet by mouth daily. (Patient not taking: Reported on 07/26/2023)   VITAMIN D PO Take 1 tablet by mouth in the morning and at bedtime. (Patient not taking: Reported on 07/26/2023)   No facility-administered encounter medications on file as of 07/26/2023.    Review of Systems  Constitutional:  Negative for appetite  change, chills, fatigue, fever and unexpected weight change.  Respiratory:  Negative for cough, chest tightness, shortness of breath and wheezing.   Cardiovascular:  Positive for leg swelling. Negative for chest pain and palpitations.  Gastrointestinal:  Negative for abdominal distention, abdominal pain, nausea and vomiting.  Genitourinary:  Negative for difficulty urinating, dysuria, flank pain, frequency and urgency.  Musculoskeletal:  Positive for gait problem. Negative for arthralgias, back pain, joint swelling and myalgias.  Skin:  Negative for color change, pallor and rash.  Neurological:  Negative for dizziness, seizures, weakness, light-headedness and headaches.    Immunization History  Administered Date(s) Administered   Fluad Quad(high Dose 65+) 10/19/2019   Influenza, High Dose Seasonal PF 04/18/2017, 05/12/2018   Influenza,inj,Quad PF,6+ Mos 06/21/2015   Pneumococcal Conjugate-13 06/21/2015   Pneumococcal Polysaccharide-23 11/15/2016   Pertinent  Health Maintenance Due  Topic Date Due   OPHTHALMOLOGY EXAM  08/31/2020   FOOT EXAM  05/02/2021   HEMOGLOBIN A1C  12/12/2022   INFLUENZA VACCINE  Discontinued      01/09/2023    3:36 PM 03/29/2023    3:00 PM 06/05/2023    2:07 PM 06/17/2023    3:23 PM 07/26/2023  2:21 PM  Fall Risk  Falls in the past year? 0 0 0  0  Was there an injury with Fall? 0  0 0 0  Fall Risk Category Calculator 0  0  1  Patient at Risk for Falls Due to No Fall Risks  No Fall Risks    Fall risk Follow up   Falls evaluation completed     Functional Status Survey:    There were no vitals filed for this visit. There is no height or weight on file to calculate BMI. Physical Exam Constitutional:      General: He is not in acute distress.    Appearance: He is not ill-appearing.  Pulmonary:     Effort: Pulmonary effort is normal. No respiratory distress.  Musculoskeletal:     Right lower leg: Edema present.     Left lower leg: Edema present.   Skin:    Comments: LLE with x 3 red open blister area  Neurological:     Mental Status: He is alert and oriented to person, place, and time.     Gait: Gait abnormal.  Psychiatric:        Mood and Affect: Affect is angry.        Speech: Speech normal.     Labs reviewed: Recent Labs    03/25/23 1340 03/29/23 1558 06/04/23 1547 07/01/23 0000  NA 139 143 144 144  K 2.8* 4.4 5.2 4.7  CL 102 108 108* 112*  CO2 26 26 24 19   GLUCOSE 102* 92 106*  --   BUN 19 18 18 17   CREATININE 1.73* 1.35* 1.26 1.3  CALCIUM 7.8* 8.2* 8.4* 8.3*   Recent Labs    10/17/22 0000 03/22/23 0000 07/01/23 0000  AST 17 15 21   ALT 10 8* 12  ALKPHOS 65 74 89  ALBUMIN 4.1 3.4* 3.4*   Recent Labs    03/25/23 1340 03/29/23 1558 07/01/23 0000  WBC 8.5 8.4 8.1  NEUTROABS 5.1 4,612 5.20  HGB 6.5* 7.8* 8.0*  HCT 21.3* 25.7* 27*  MCV 82.9 84.3  --   PLT 192 220 185   Lab Results  Component Value Date   TSH 4.89 (H) 06/13/2022   Lab Results  Component Value Date   HGBA1C 6.4 (H) 06/13/2022   Lab Results  Component Value Date   CHOL 137 06/13/2022   HDL 38 (L) 06/13/2022   LDLCALC 85 06/13/2022   TRIG 63 06/13/2022   CHOLHDL 3.6 06/13/2022    Significant Diagnostic Results in last 30 days:  No results found.  Assessment/Plan 1. Edema of both lower extremities (Primary) Has worsen due to not taking Furosemide  as prescribed.Stopped due to not making it to the toilet. - discussed with patient and family importance of taking Furosemide  to keep swelling on the legs down.patient verbalized understanding states will restart Furosemide  twice daily. Encouraged to wear adult size incontinent pull up to help me making it to the bathroom but he declines.Also declines BSC and urinal.   2. Cellulitis of left lower leg Bilateral leg erythema with open blister areas on left leg Suspect as a result of not taking furosemide . Start on Doxycycline  and Cefadroxil  for better coverage.  Advised to restart  furosemide  as prescribed. - Advise to notify provider if symptoms worsen or fail to resolve.  - doxycycline  (VIBRA -TABS) 100 MG tablet; Take 1 tablet (100 mg total) by mouth 2 (two) times daily for 10 days.  Dispense: 20 tablet; Refill: 0 - cefadroxil  (DURICEF) 500 MG  capsule; Take 1 capsule (500 mg total) by mouth 2 (two) times daily for 10 days.  Dispense: 20 capsule; Refill: 0  Family/ staff Communication: Reviewed plan of care with patient and Nephew verbalized understanding   Labs/tests ordered: None   Next Appointment: Return if symptoms worsen or fail to improve.  I connected with  [name] on 07/26/23 by a video enabled telemedicine application and verified that I am speaking with the correct person using two identifiers.   I discussed the limitations of evaluation and management by telemedicine. The patient expressed understanding and agreed to proceed.   Spent 20 minutes of face to face with patient  >50% time spent counseling; reviewing medical record; tests; labs; documenting and developing future plan of care.  Roxan JAYSON Plough, NP

## 2023-08-06 ENCOUNTER — Encounter (HOSPITAL_COMMUNITY): Payer: Self-pay | Admitting: Emergency Medicine

## 2023-08-06 ENCOUNTER — Emergency Department (HOSPITAL_COMMUNITY): Payer: PPO

## 2023-08-06 ENCOUNTER — Ambulatory Visit: Payer: PPO | Admitting: Family

## 2023-08-06 ENCOUNTER — Ambulatory Visit: Payer: PPO | Admitting: Sports Medicine

## 2023-08-06 ENCOUNTER — Emergency Department (HOSPITAL_COMMUNITY)
Admission: EM | Admit: 2023-08-06 | Discharge: 2023-08-06 | Disposition: A | Discharge status: Home / Self Care | Payer: PPO | Attending: Emergency Medicine | Admitting: Emergency Medicine

## 2023-08-06 DIAGNOSIS — Z7901 Long term (current) use of anticoagulants: Secondary | ICD-10-CM | POA: Insufficient documentation

## 2023-08-06 DIAGNOSIS — R0602 Shortness of breath: Secondary | ICD-10-CM | POA: Diagnosis not present

## 2023-08-06 DIAGNOSIS — R7989 Other specified abnormal findings of blood chemistry: Secondary | ICD-10-CM | POA: Diagnosis not present

## 2023-08-06 DIAGNOSIS — I509 Heart failure, unspecified: Secondary | ICD-10-CM | POA: Diagnosis not present

## 2023-08-06 DIAGNOSIS — R2243 Localized swelling, mass and lump, lower limb, bilateral: Secondary | ICD-10-CM | POA: Diagnosis not present

## 2023-08-06 DIAGNOSIS — Z20822 Contact with and (suspected) exposure to covid-19: Secondary | ICD-10-CM | POA: Diagnosis not present

## 2023-08-06 DIAGNOSIS — I7121 Aneurysm of the ascending aorta, without rupture: Secondary | ICD-10-CM | POA: Diagnosis not present

## 2023-08-06 DIAGNOSIS — J90 Pleural effusion, not elsewhere classified: Secondary | ICD-10-CM | POA: Insufficient documentation

## 2023-08-06 DIAGNOSIS — I700 Atherosclerosis of aorta: Secondary | ICD-10-CM | POA: Diagnosis not present

## 2023-08-06 DIAGNOSIS — R918 Other nonspecific abnormal finding of lung field: Secondary | ICD-10-CM | POA: Diagnosis not present

## 2023-08-06 DIAGNOSIS — R600 Localized edema: Secondary | ICD-10-CM | POA: Insufficient documentation

## 2023-08-06 DIAGNOSIS — I517 Cardiomegaly: Secondary | ICD-10-CM | POA: Diagnosis not present

## 2023-08-06 DIAGNOSIS — R531 Weakness: Secondary | ICD-10-CM | POA: Diagnosis not present

## 2023-08-06 DIAGNOSIS — I10 Essential (primary) hypertension: Secondary | ICD-10-CM | POA: Diagnosis not present

## 2023-08-06 LAB — COMPREHENSIVE METABOLIC PANEL
ALT: 11 U/L (ref 0–44)
AST: 20 U/L (ref 15–41)
Albumin: 3.1 g/dL — ABNORMAL LOW (ref 3.5–5.0)
Alkaline Phosphatase: 83 U/L (ref 38–126)
Anion gap: 9 (ref 5–15)
BUN: 22 mg/dL (ref 8–23)
CO2: 20 mmol/L — ABNORMAL LOW (ref 22–32)
Calcium: 8.3 mg/dL — ABNORMAL LOW (ref 8.9–10.3)
Chloride: 111 mmol/L (ref 98–111)
Creatinine, Ser: 1.04 mg/dL (ref 0.61–1.24)
GFR, Estimated: >60 mL/min (ref >60)
Glucose, Bld: 114 mg/dL — ABNORMAL HIGH (ref 70–99)
Potassium: 3.6 mmol/L (ref 3.5–5.1)
Sodium: 140 mmol/L (ref 135–145)
Total Bilirubin: 0.9 mg/dL (ref 0.0–1.2)
Total Protein: 6.8 g/dL (ref 6.5–8.1)

## 2023-08-06 LAB — CBC WITH DIFFERENTIAL/PLATELET
Abs Immature Granulocytes: 0.04 10*3/uL (ref 0.00–0.07)
Basophils Absolute: 0.1 10*3/uL (ref 0.0–0.1)
Basophils Relative: 0 %
Eosinophils Absolute: 0 10*3/uL (ref 0.0–0.5)
Eosinophils Relative: 0 %
HCT: 27.4 % — ABNORMAL LOW (ref 39.0–52.0)
Hemoglobin: 8.2 g/dL — ABNORMAL LOW (ref 13.0–17.0)
Immature Granulocytes: 0 %
Lymphocytes Relative: 24 %
Lymphs Abs: 2.9 10*3/uL (ref 0.7–4.0)
MCH: 27.6 pg (ref 26.0–34.0)
MCHC: 29.9 g/dL — ABNORMAL LOW (ref 30.0–36.0)
MCV: 92.3 fL (ref 80.0–100.0)
Monocytes Absolute: 0.8 10*3/uL (ref 0.1–1.0)
Monocytes Relative: 7 %
Neutro Abs: 8.1 10*3/uL — ABNORMAL HIGH (ref 1.7–7.7)
Neutrophils Relative %: 69 %
Platelets: 257 10*3/uL (ref 150–400)
RBC: 2.97 MIL/uL — ABNORMAL LOW (ref 4.22–5.81)
RDW: 18.1 % — ABNORMAL HIGH (ref 11.5–15.5)
WBC: 11.9 10*3/uL — ABNORMAL HIGH (ref 4.0–10.5)
nRBC: 0.2 % (ref 0.0–0.2)

## 2023-08-06 LAB — RESP PANEL BY RT-PCR (RSV, FLU A&B, COVID)  RVPGX2
Influenza A by PCR: NEGATIVE
Influenza B by PCR: NEGATIVE
Resp Syncytial Virus by PCR: NEGATIVE
SARS Coronavirus 2 by RT PCR: NEGATIVE

## 2023-08-06 LAB — BRAIN NATRIURETIC PEPTIDE: B Natriuretic Peptide: 449.8 pg/mL — ABNORMAL HIGH (ref 0.0–100.0)

## 2023-08-06 LAB — URINALYSIS, ROUTINE W REFLEX MICROSCOPIC
Bilirubin Urine: NEGATIVE
Glucose, UA: NEGATIVE mg/dL
Hgb urine dipstick: NEGATIVE
Ketones, ur: NEGATIVE mg/dL
Leukocytes,Ua: NEGATIVE
Nitrite: NEGATIVE
Protein, ur: 30 mg/dL — AB
Specific Gravity, Urine: 1.019 (ref 1.005–1.030)
pH: 5 (ref 5.0–8.0)

## 2023-08-06 LAB — TYPE AND SCREEN
ABO/RH(D): O NEG
Antibody Screen: NEGATIVE

## 2023-08-06 LAB — TROPONIN I (HIGH SENSITIVITY)
Troponin I (High Sensitivity): 50 ng/L — ABNORMAL HIGH (ref <18)
Troponin I (High Sensitivity): 47 ng/L — ABNORMAL HIGH (ref <18)

## 2023-08-06 MED ORDER — MEDIHONEY WOUND/BURN DRESSING EX PSTE: 1.0000 | PASTE | Freq: Every day | CUTANEOUS | 1 refills | Status: DC

## 2023-08-06 NOTE — ED Triage Notes (Signed)
 Pt here from home with c/o weakness over the last week no sob , has needed blood transfusions in the past

## 2023-08-06 NOTE — Discharge Instructions (Addendum)
 Change your dressings every 2-3 days at home.  Keep your legs elevated at home.  Please take your lasix at home to help urinate and get fluid out of your lungs.  Call your cardiologist for a follow up appointment.

## 2023-08-06 NOTE — ED Notes (Signed)
 Pt aware that a urine sample is needed. He has a urinal and a urine cup at the bedside.

## 2023-08-06 NOTE — ED Provider Notes (Signed)
 [place] EMERGENCY DEPARTMENT AT [facility] Provider Note   CSN: [id number] Arrival date & time: 08/06/23  1223     History  No chief complaint on file.   Ronnie Jackson is a 88 y.o. male with a history of congestive heart failure presenting to ED with complaint of generalized weakness, shortness of breath.  Patient reports that this is worsened for the past 2 weeks.  He has significant edema in his legs which limits his ambulation at home, but typically is able to get out of his wheelchair, pivot and use a walker.  He lives with a family member, but is currently here with his grandson who lives next-door.  The patient says he has had no energy, extreme weakness, feels that he is too weak to even lift his arm to comb his hair.  He has a history of anemia in the past which has required transfusion.  He has a history of A-fib but was taken off of Eliquis  after his prior transfusions concern for GI bleed.  He denies any hematemesis, bloody or dark stool.  He has just completed a course of doxycycline  prescribed by his doctor for lower extremity potential infection.  He is prescribed furosemide  but stopped taking this about 2 weeks ago because he does not like how often he is required to go to the bathroom or urinate after taking it.  He was prescribed this for heart failure  HPI     Home Medications Prior to Admission medications   Medication Sig Start Date End Date Taking? Authorizing Provider  leptospermum manuka honey (MEDIHONEY) PSTE paste Apply 1 Application topically daily. Apply thin layer to non-stick pad onto your lower leg once daily 08/06/23  Yes Jamel Holzmann, Donnice PARAS, MD  acetaminophen  (TYLENOL ) 500 MG tablet Take 1 tablet (500 mg total) by mouth every 8 (eight) hours as needed for moderate pain. Patient not taking: Reported on 06/17/2023 12/26/21   Ngetich, [name] C, NP  apixaban  (ELIQUIS ) 2.5 MG TABS tablet Take 1 tablet (2.5 mg total) by mouth 2 (two) times  daily. Patient not taking: Reported on 03/29/2023 05/07/22   Ngetich, [name] C, NP  aspirin EC 81 MG tablet Take 81 mg by mouth daily. Swallow whole.    [provider]  furosemide  (LASIX ) 40 MG tablet Take 1 tablet (40 mg total) by mouth 2 (two) times daily. 07/26/23   Ngetich, [name] C, NP  KLOR-CON  M20 20 MEQ tablet TAKE 2 TABLETS BY MOUTH DAILY WHEN TAKING LASIX  (FUROSMIDE) Patient not taking: Reported on 07/26/2023 05/14/22   Ngetich, [name] C, NP  Multiple Vitamins-Minerals (PRESERVISION AREDS 2 PO) Take 1 tablet by mouth daily. Patient not taking: Reported on 07/26/2023    [provider]  polyethylene glycol (MIRALAX  / GLYCOLAX ) 17 g packet Take 17 g by mouth daily. 11/28/19   Khatri, Hina, PA-C  VITAMIN D PO Take 1 tablet by mouth in the morning and at bedtime. Patient not taking: Reported on 07/26/2023    [provider]      Allergies    Patient has no known allergies.    Review of Systems   Review of Systems  Physical Exam Updated Vital Signs BP (!) 130/92 (BP Location: Right Arm)   Pulse 93   Temp 98.2 F (36.8 C) (Oral)   Resp 20   SpO2 100%  Physical Exam Constitutional:      General: He is not in acute distress. HENT:     Head: Normocephalic and atraumatic.  Eyes:     Conjunctiva/sclera: Conjunctivae normal.     Pupils: Pupils are equal, round, and reactive to light.  Cardiovascular:     Rate and Rhythm: Normal rate. Rhythm irregular.  Pulmonary:     Effort: Pulmonary effort is normal. No respiratory distress.  Abdominal:     General: There is no distension.     Tenderness: There is no abdominal tenderness.  Musculoskeletal:     Comments: Significant weeping pitting edema of the bilateral lower extremities, stasis dermatitis findings  Skin:    General: Skin is warm and dry.  Neurological:     General: No focal deficit present.     Mental Status: He is alert. Mental status is at baseline.  Psychiatric:        Mood and Affect: Mood normal.         Behavior: Behavior normal.     ED Results / Procedures / Treatments   Labs (all labs ordered are listed, but only abnormal results are displayed) Labs Reviewed  CBC WITH DIFFERENTIAL/PLATELET - Abnormal; Notable for the following components:      Result Value   WBC 11.9 (*)    RBC 2.97 (*)    Hemoglobin 8.2 (*)    HCT 27.4 (*)    MCHC 29.9 (*)    RDW 18.1 (*)    Neutro Abs 8.1 (*)    All other components within normal limits  COMPREHENSIVE METABOLIC PANEL - Abnormal; Notable for the following components:   CO2 20 (*)    Glucose, Bld 114 (*)    Calcium 8.3 (*)    Albumin 3.1 (*)    All other components within normal limits  URINALYSIS, ROUTINE W REFLEX MICROSCOPIC - Abnormal; Notable for the following components:   Protein, ur 30 (*)    Bacteria, UA RARE (*)    All other components within normal limits  BRAIN NATRIURETIC PEPTIDE - Abnormal; Notable for the following components:   B Natriuretic Peptide 449.8 (*)    All other components within normal limits  TROPONIN I (HIGH SENSITIVITY) - Abnormal; Notable for the following components:   Troponin I (High Sensitivity) 50 (*)    All other components within normal limits  TROPONIN I (HIGH SENSITIVITY) - Abnormal; Notable for the following components:   Troponin I (High Sensitivity) 47 (*)    All other components within normal limits  RESP PANEL BY RT-PCR (RSV, FLU A&B, COVID)  [id number]  POC OCCULT BLOOD, ED  TYPE AND SCREEN    EKG EKG Interpretation Date/Time:  Tuesday August 06 2023 15:04:20 EST Ventricular Rate:  89 PR Interval:    QRS Duration:  89 QT Interval:  418 QTC Calculation: 506 R Axis:   30  Text Interpretation: Atrial fibrillation Ventricular premature complex Anteroseptal infarct, old Nonspecific repol abnormality, lateral leads Prolonged QT interval Confirmed by Cottie Cough [phone]) on 08/06/2023 3:18:48 PM  Radiology CT Chest Wo Contrast Result Date: 08/06/2023 CLINICAL DATA:  Pneumonia,  complication suspected, xray done EXAM: CT CHEST WITHOUT CONTRAST TECHNIQUE: Multidetector CT imaging of the chest was performed following the standard protocol without IV contrast. RADIATION DOSE REDUCTION: This exam was performed according to the departmental dose-optimization program which includes automated exposure control, adjustment of the mA and/or kV according to patient size and/or use of iterative reconstruction technique. COMPARISON:  Chest x-ray 08/06/2023,  chest x-ray 06/03/2015 FINDINGS: Cardiovascular: Normal heart size. No significant pericardial effusion. The ascending thoracic aorta is normal in caliber measuring up to 4.2 cm. Moderate  atherosclerotic plaque of the thoracic aorta. Four-vessel coronary artery calcifications. The main pulmonary artery is enlarged measuring up to 3.4 cm. Mediastinum/Nodes: No gross hilar adenopathy, noting limited sensitivity for the detection of hilar adenopathy on this noncontrast study. No enlarged mediastinal or axillary lymph nodes. Thyroid  gland, trachea, and esophagus demonstrate no significant findings. Lungs/Pleura: Passive atelectasis bilateral lower lobes. No focal consolidation. No pulmonary nodule. No pulmonary mass. Small to moderate pleural effusions. No pneumothorax. Upper Abdomen: No acute abnormality. Musculoskeletal: No abdominal wall hernia or abnormality. No suspicious lytic or blastic osseous lesions. No acute displaced fracture. Multilevel degenerative changes of the spine. Bilateral shoulder degenerative changes. IMPRESSION: 1. Aneurysmal ascending thoracic aorta (4.2 cm). Recommend annual imaging followup by CTA or MRA. This recommendation follows 2010 ACCF/AHA/AATS/ACR/ASA/SCA/SCAI/SIR/STS/SVM Guidelines for the Diagnosis and Management of Patients with Thoracic Aortic Disease. Circulation. 2010; 121: Z733-z630. Aortic aneurysm NOS (ICD10-I71.9). 2. Small to moderate pleural effusions. 3. Aortic Atherosclerosis (ICD10-I70.0) - including 4  vessel coronary arteries. 4. Limited evaluation due to noncontrast study. Electronically Signed   By: Morgane  Naveau M.D.   On: 08/06/2023 20:50   DG Chest 1 View Result Date: 08/06/2023 CLINICAL DATA:  Weakness. EXAM: CHEST  1 VIEW COMPARISON:  Chest radiographs 06/03/2015, 01/18/2010 FINDINGS: Cardiac silhouette is again mildly to moderately enlarged. Mediastinal contours are within normal limits. There is cephalization of the pulmonary vasculature. Mild indistinctness of the bilateral interstitial markings. Mild opacity over the left costophrenic angle with minimal bilateral costophrenic angle blunting. No pneumothorax. No acute skeletal abnormality. IMPRESSION: Stable enlarged cardiac silhouette. Cephalization of the pulmonary vasculature as can be seen with pulmonary venous hypertension. Probable small left-greater-than-right pleural effusions. Electronically Signed   By: Tanda Lyons M.D.   On: 08/06/2023 15:49    Procedures Procedures    Medications Ordered in ED Medications - No data to display  ED Course/ Medical Decision Making/ A&P                                 Medical Decision Making Amount and/or Complexity of Data Reviewed Radiology: ordered.  Risk Prescription drug management.   This patient presents to the ED with concern for weakness, fatigue. This involves an extensive number of treatment options, and is a complaint that carries with it a high risk of complications and morbidity.  The differential diagnosis includes anemia versus infection versus pleural effusion versus CHF versus other  Co-morbidities that complicate the patient evaluation: History of GI bleed, anemia, heart failure, risk of exacerbation  Additional history obtained from patient's family member at the bedside  External records from outside source obtained and reviewed including echocardiogram May 2023 showing EF of 40 to 45%  I ordered and personally interpreted labs.  The pertinent results  include: Mildly elevated but flat troponins.  UA without evidence of infection.  COVID and flu are negative.  BNP is also mildly elevated 449.  Hemoglobin near chronic baseline levels, 8.2, no acute worsening from prior levels.  I ordered imaging studies including x-ray of the chest and CT of the chest I independently visualized and interpreted imaging which showed  incidental ascending aneurysm noted, small pleural effusions I agree with the radiologist interpretation  The patient was maintained on a cardiac monitor.  I personally viewed and interpreted the cardiac monitored which showed an underlying rhythm of: A-fib rate controlled  Per my interpretation the patient's ECG shows chronic A-fib with no acute ischemic findings  I  have reviewed the patients home medicines and have made adjustments as needed  Test Considered: Low suspicion for acute PE in this clinical setting  After the interventions noted above, I reevaluated the patient and found that they have: stayed the same  Ultimately, I suspect the patient's symptoms are related to discontinuing his furosemide  diuretic at home, subsequently developing worsening lower extremity and pulmonary edema.  We had a long discussion of this with his family at present.  The patient understands now that he needs to be compliant with this medication.  We discussed about the option of staying in the hospital, given his advanced age, for diuresis, but he is adamant he does not want to stay.  His family is comfortable taking him home and keeping an eye on them.  He understands the need to restart the Lasix  but prefers to do this at home, given his need to frequently urinate.  Please note that at time of discharge the patient was not hypoxic and not requiring supplemental oxygen.  Separately were able to wrap his lower extremities with dressings.  He has weeping edema of the lower extremities.  He will need wound care and his family is able to arrange for  this at home.  I do not believe this is an infection, I suspect this is venous stasis dermatitis.  He did complete a course of doxycycline  prescribed as an outpatient, completing yesterday, but this is not appear to be cellulitis.   Dispostion:  After consideration of the diagnostic results and the patients response to treatment, I feel that the patent would benefit from close outpatient follow-up..         Final Clinical Impression(s) / ED Diagnoses Final diagnoses:  Pleural effusion    Rx / DC Orders ED Discharge Orders          Ordered    leptospermum manuka honey (MEDIHONEY) PSTE paste  Daily        08/06/23 2156              Cottie Donnice PARAS, MD 08/06/23 2320

## 2023-08-06 NOTE — ED Provider Triage Note (Signed)
 Emergency Medicine Provider Triage Evaluation Note  Ronnie Jackson , a 88 y.o. male  was evaluated in triage.  Pt complains of generalized weakness.  History of same and required a blood transfusion.  Also endorses some shortness of breath.  Has chronic lower extremity edema.  History of CHF.  Review of Systems  Positive: weakness Negative: CP  Physical Exam  There were no vitals taken for this visit. Gen:   Awake, no distress   Resp:  Normal effort  MSK:   Moves extremities without difficulty  Other:  2+ pitting edema bilaterally  Medical Decision Making  Medically screening exam initiated at 1:06 PM.  Appropriate orders placed.  Ronnie Jackson was informed that the remainder of the evaluation will be completed by another provider, this initial triage assessment does not replace that evaluation, and the importance of remaining in the ED until their evaluation is complete.  labs   Ronnie Jackson, [place] 08/06/23 [id number]

## 2023-08-07 NOTE — Progress Notes (Signed)
   This encounter was created in error - please disregard. No show

## 2023-08-21 ENCOUNTER — Telehealth: Payer: Self-pay

## 2023-08-21 NOTE — Telephone Encounter (Signed)
Recommend referral to Neurologist for further evaluation

## 2023-08-21 NOTE — Telephone Encounter (Signed)
[name] called on behalf of patient asking what can be done about patients frequent falls and legs giving out.  Please advise

## 2023-08-28 NOTE — Telephone Encounter (Signed)
Attempted to call patient twice. Unable to leave voicemail.

## 2023-09-05 DIAGNOSIS — R55 Syncope and collapse: Secondary | ICD-10-CM | POA: Diagnosis not present

## 2023-09-05 DIAGNOSIS — R0602 Shortness of breath: Secondary | ICD-10-CM | POA: Diagnosis not present

## 2023-09-05 DIAGNOSIS — I499 Cardiac arrhythmia, unspecified: Secondary | ICD-10-CM | POA: Diagnosis not present

## 2023-09-05 DIAGNOSIS — R404 Transient alteration of awareness: Secondary | ICD-10-CM | POA: Diagnosis not present

## 2023-09-21 DIAGNOSIS — 419620001 Death: Secondary | SNOMED CT | POA: Diagnosis not present

## 2023-09-21 DEATH — deceased

## 2024-03-04 NOTE — Addendum Note (Signed)
 Addended byBETHA LEONARDA BURDOCK C on: 03/04/2024 10:13 AM   Modules accepted: Level of Service
